# Patient Record
Sex: Male | Born: 1950
Health system: Southern US, Community
[De-identification: ages and names within clinical notes are randomized; demographics above are authoritative.]

## PROBLEM LIST (undated history)

## (undated) DIAGNOSIS — I2510 Atherosclerotic heart disease of native coronary artery without angina pectoris: Secondary | ICD-10-CM

## (undated) DIAGNOSIS — M7989 Other specified soft tissue disorders: Secondary | ICD-10-CM

## (undated) DIAGNOSIS — E119 Type 2 diabetes mellitus without complications: Secondary | ICD-10-CM

## (undated) DIAGNOSIS — M109 Gout, unspecified: Secondary | ICD-10-CM

## (undated) DIAGNOSIS — M1990 Unspecified osteoarthritis, unspecified site: Secondary | ICD-10-CM

## (undated) DIAGNOSIS — E785 Hyperlipidemia, unspecified: Secondary | ICD-10-CM

## (undated) DIAGNOSIS — C801 Malignant (primary) neoplasm, unspecified: Secondary | ICD-10-CM

## (undated) DIAGNOSIS — I219 Acute myocardial infarction, unspecified: Secondary | ICD-10-CM

## (undated) DIAGNOSIS — I10 Essential (primary) hypertension: Secondary | ICD-10-CM

## (undated) HISTORY — PX: BACK SURGERY: SHX140

## (undated) HISTORY — DX: Essential (primary) hypertension: I10

## (undated) HISTORY — DX: Hyperlipidemia, unspecified: E78.5

## (undated) HISTORY — DX: Malignant (primary) neoplasm, unspecified: C80.1

## (undated) HISTORY — PX: PROSTATE SURGERY: SHX751

---

## 1989-06-23 HISTORY — PX: LUMBAR DISC SURGERY: SHX700

## 1998-08-23 ENCOUNTER — Ambulatory Visit (HOSPITAL_COMMUNITY): Admission: RE | Admit: 1998-08-23 | Discharge: 1998-08-23 | Payer: Self-pay | Admitting: Gastroenterology

## 1999-02-28 ENCOUNTER — Encounter: Payer: Self-pay | Admitting: Neurosurgery

## 1999-03-01 ENCOUNTER — Inpatient Hospital Stay (HOSPITAL_COMMUNITY): Admission: RE | Admit: 1999-03-01 | Discharge: 1999-03-01 | Payer: Self-pay | Admitting: Neurosurgery

## 1999-03-01 ENCOUNTER — Encounter: Payer: Self-pay | Admitting: Neurosurgery

## 2000-10-22 ENCOUNTER — Encounter: Admission: RE | Admit: 2000-10-22 | Discharge: 2001-01-20 | Payer: Self-pay | Admitting: Emergency Medicine

## 2001-02-06 ENCOUNTER — Encounter: Payer: Self-pay | Admitting: Emergency Medicine

## 2001-02-06 ENCOUNTER — Emergency Department (HOSPITAL_COMMUNITY): Admission: AC | Admit: 2001-02-06 | Discharge: 2001-02-06 | Payer: Self-pay

## 2001-12-23 ENCOUNTER — Ambulatory Visit (HOSPITAL_COMMUNITY): Admission: RE | Admit: 2001-12-23 | Discharge: 2001-12-23 | Payer: Self-pay | Admitting: Gastroenterology

## 2003-06-09 ENCOUNTER — Encounter: Admission: RE | Admit: 2003-06-09 | Discharge: 2003-06-09 | Payer: Self-pay | Admitting: Emergency Medicine

## 2003-06-09 ENCOUNTER — Encounter: Payer: Self-pay | Admitting: Emergency Medicine

## 2005-08-27 ENCOUNTER — Emergency Department (HOSPITAL_COMMUNITY): Admission: EM | Admit: 2005-08-27 | Discharge: 2005-08-27 | Payer: Self-pay | Admitting: Emergency Medicine

## 2006-07-16 ENCOUNTER — Emergency Department (HOSPITAL_COMMUNITY): Admission: EM | Admit: 2006-07-16 | Discharge: 2006-07-16 | Payer: Self-pay | Admitting: Emergency Medicine

## 2007-02-05 ENCOUNTER — Ambulatory Visit: Payer: Self-pay | Admitting: Cardiovascular Disease

## 2007-02-18 ENCOUNTER — Ambulatory Visit: Payer: Self-pay

## 2007-03-25 ENCOUNTER — Ambulatory Visit: Payer: Self-pay | Admitting: Cardiovascular Disease

## 2007-03-25 ENCOUNTER — Encounter: Payer: Self-pay | Admitting: Cardiovascular Disease

## 2007-03-25 ENCOUNTER — Ambulatory Visit: Payer: Self-pay

## 2008-01-27 ENCOUNTER — Encounter (INDEPENDENT_AMBULATORY_CARE_PROVIDER_SITE_OTHER): Payer: Self-pay | Admitting: Gastroenterology

## 2008-01-27 ENCOUNTER — Ambulatory Visit (HOSPITAL_COMMUNITY): Admission: RE | Admit: 2008-01-27 | Discharge: 2008-01-27 | Payer: Self-pay | Admitting: Gastroenterology

## 2010-10-10 ENCOUNTER — Ambulatory Visit (HOSPITAL_COMMUNITY)
Admission: RE | Admit: 2010-10-10 | Discharge: 2010-10-10 | Payer: Self-pay | Source: Home / Self Care | Attending: Internal Medicine | Admitting: Internal Medicine

## 2010-10-10 ENCOUNTER — Encounter (INDEPENDENT_AMBULATORY_CARE_PROVIDER_SITE_OTHER): Payer: Self-pay | Admitting: Internal Medicine

## 2011-01-28 ENCOUNTER — Emergency Department (HOSPITAL_COMMUNITY)
Admission: EM | Admit: 2011-01-28 | Discharge: 2011-01-28 | Disposition: A | Discharge status: Home / Self Care | Payer: 59 | Attending: Emergency Medicine | Admitting: Emergency Medicine

## 2011-01-28 DIAGNOSIS — E119 Type 2 diabetes mellitus without complications: Secondary | ICD-10-CM | POA: Insufficient documentation

## 2011-01-28 DIAGNOSIS — R609 Edema, unspecified: Secondary | ICD-10-CM | POA: Insufficient documentation

## 2011-01-28 DIAGNOSIS — M79609 Pain in unspecified limb: Secondary | ICD-10-CM | POA: Insufficient documentation

## 2011-01-28 DIAGNOSIS — M129 Arthropathy, unspecified: Secondary | ICD-10-CM | POA: Insufficient documentation

## 2011-01-28 DIAGNOSIS — I10 Essential (primary) hypertension: Secondary | ICD-10-CM | POA: Insufficient documentation

## 2011-03-03 ENCOUNTER — Emergency Department (HOSPITAL_COMMUNITY)
Admission: EM | Admit: 2011-03-03 | Discharge: 2011-03-03 | Disposition: A | Discharge status: Home / Self Care | Payer: PRIVATE HEALTH INSURANCE | Attending: Emergency Medicine | Admitting: Emergency Medicine

## 2011-03-03 ENCOUNTER — Emergency Department (HOSPITAL_COMMUNITY): Payer: PRIVATE HEALTH INSURANCE

## 2011-03-03 DIAGNOSIS — M25519 Pain in unspecified shoulder: Secondary | ICD-10-CM | POA: Insufficient documentation

## 2011-03-03 DIAGNOSIS — Y92009 Unspecified place in unspecified non-institutional (private) residence as the place of occurrence of the external cause: Secondary | ICD-10-CM | POA: Insufficient documentation

## 2011-03-03 DIAGNOSIS — I10 Essential (primary) hypertension: Secondary | ICD-10-CM | POA: Insufficient documentation

## 2011-03-03 DIAGNOSIS — E119 Type 2 diabetes mellitus without complications: Secondary | ICD-10-CM | POA: Insufficient documentation

## 2011-03-03 DIAGNOSIS — W010XXA Fall on same level from slipping, tripping and stumbling without subsequent striking against object, initial encounter: Secondary | ICD-10-CM | POA: Insufficient documentation

## 2011-03-03 DIAGNOSIS — M79609 Pain in unspecified limb: Secondary | ICD-10-CM | POA: Insufficient documentation

## 2011-03-07 NOTE — Assessment & Plan Note (Signed)
[facility]                            CARDIOLOGY OFFICE NOTE   NAME:Tanabe, KEBIN MAYE                   MRN:          [id number]  DATE:03/25/2007                            DOB:          1951/04/10    Gregory [name] was seen in followup at the [facility] Cardiology office  on March 25, 2007.  He is a 60 year old gentleman who was referred for  evaluation because of an abnormal EKG.  His EKG demonstrated  anterolateral T-wave changes that raised some concern for ischemia.  Mr.  Meisenheimer is asymptomatic but in that setting, with a background of  diabetes, hypertension and hyperlipidemia, we elected to perform a  nuclear stress study.  His Myoview stress test was performed on April 28  and demonstrated normal perfusion.  The calculated gated left  ventricular ejection fraction was 42% but visually the EF appeared  better than that.  A followup echocardiogram was recommended.  His  echocardiogram was performed this morning and has not been officially  interpreted yet.  However, I have reviewed his echocardiogram and his LV  function appears vigorous.  I would estimate his ejection fraction in  the range of 65%.  He does meet criteria for left ventricular  hypertrophy and also has some diastolic dysfunction based on his mitral  in-flow.   He remains asymptomatic from a cardiac standpoint and continues to work  without any problems.  He does complain of a nonproductive cough that  has been ongoing ever since he started Lotrel.   CURRENT MEDICINES:  1. Lotrel 5/20 mg daily.  2. Metformin 500 mg two twice daily.  3. Pravachol 40 mg at bedtime.  4. Probenecid 250 mg daily.   ALLERGIES:  No known drug allergies.   PHYSICAL EXAMINATION:  GENERAL:  The patient is alert and oriented.  He  is in no acute distress.  VITAL SIGNS:  Weight is 230 pounds, blood pressure is 154/90.  Heart  rate initially was 108, on my recheck it was 100.  HEENT:  Normal.  NECK:  Normal carotid upstrokes without bruits.  Jugular venous pressure  is normal.  LUNGS:  Clear to auscultation bilaterally.  HEART:  Regular rate and rhythm without murmur or gallop.  The apex is  discrete and nondisplaced.  ABDOMEN:  Soft, obese, nontender, no organomegaly.  EXTREMITIES:  No clubbing, cyanosis, or edema.  Peripheral pulses are 2+  and equal throughout.   ASSESSMENT:  Mr. Kathan is a 60 year old gentleman who is stable  from a cardiac standpoint.  His cardiac problems are as follows:  1. Essential hypertension with evidence of hypertensive myocardial      disease based on his left ventricular hypertrophy and abnormal      diastolic parameters on his echocardiogram.  I think control of his      blood pressure over the long-term should be his main cardiac goal.      We discussed the importance of diet and weight reduction.  He      appears to be intolerant to the ACE inhibitor portion of his      Lotrel.  I  have taken the liberty of switching him to Norvasc 10 mg      daily and Avapro 150 mg daily.  We will see how his blood pressure      responds to this combination of calcium channel blocker and ARB.      He has a followup appointment with Dr. Lorenz Coaster in 1 month and then I      would like to see him back in a few months to see how he is doing      at that point.  2. Abnormal electrocardiogram.  Again, his Myoview study showed normal      perfusion.  No further cardiac workup is necessary at this time.      In the setting of his diabetes and other multitude of risk factors,      I do think he should start on aspirin 81 mg daily for primary      prevention.   For followup, I will see Mr. Duda back in 2 months as above.     Veverly Fells. Excell Seltzer, MD  Electronically Signed    MDC/MedQ  DD: 03/25/2007  DT: 03/25/2007  Job #: [id number]   cc:   Reuben Likes, M.D.

## 2011-03-10 NOTE — Letter (Signed)
February 05, 2007    Dr. Leslee Home  [address] [place], [place] [address]   RE:  Gregory [name]  MRN:  [id number]  /  DOB:  January 22, 1951   Dear Dr. Lorenz Coaster,   It was my pleasure to see Gregory [name] as an outpatient at the  [facility] cardiology office on February 05, 2007. As you know he is a very  nice 60 year old African American gentleman with an abnormal EKG.   Gregory [name] has no cardiac history. He denies any problems with chest  pain, dyspnea, or other cardiac related complaints. 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 He  specifically denies any chest pain or dyspnea with exertion. He has had  no chest pressure. He denies lightheadedness, syncope, orthopnea, or  PND. He has noticed left leg swelling for sometime. He has had no  palpitations or claudication symptoms.   PAST MEDICAL HISTORY:  1. Type 2 diabetes mangaged with metformin, first diagnosed      approximately 6 years ago.  2. Hypertension, currently on Lotrel 5/20 mg daily. He reports that he      was taking it twice daily but insurance would not cover that. He      just started taking it once daily.  3. Dyslipidemia, on Pravachol.  4. Gout.  5. Lumbar back surgery in 1999.   SOCIAL HISTORY:  The patient is married. He has 2 children. 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 He  does not regularly exercise. He is a former smoker but quit 10 years  ago. He drinks alcohol only on occasion. He does not drink caffeine.   FAMILY HISTORY:  His mother died in her 59s from cancer. His father died  at age 64 of cancer. He has 2 sisters who are healthy. There is no  coronary artery disease in the family.   REVIEW OF SYSTEMS:  A complete 12-point review of systems was preformed,  pertinent positive included occasional headache, as well as foot pain  from gout. No other positives to report.   PHYSICAL EXAMINATION:  The patient is alert and oriented. He is in no  acute distress. Weight is 228 pounds, height is 5 feet 8 inches, blood  pressure is 144/90, heart rate is 86, respiratory rate is 16.  HEENT: Normal.  NECK: Normal carotid upstrokes without bruits. Jugular venous pressure  is normal. There is no thyromegaly or thyroid nodules.  LUNGS: Clear to auscultation bilaterally.  CARDIAC: The apex is not palpable. There is no right ventricular heave  or lift.  HEART: Regular rate and rhythm without murmurs or gallops.  ABDOMEN: Soft, obese, nontender. No organomegaly. No abdominal bruits.  EXTREMITIES: There is no clubbing, cyanosis, or edema. There is 1+ edema  in the left lower leg. The right lower leg has no edema.  SKIN: Warm and dry without rash.  NEUROLOGIC: Cranial nerves II-XII are intact. Strength is 5/5 and equal  in the arms and legs bilaterally.  MUSCULOSKELETAL: There is no joint deformity or effusions.   EKG: Shows normal sinus rhythm with a lateral T wave abnormality,  consider lateral ischemia. There is left atrial enlargement present.   ASSESSMENT:  Gregory [name] is an asymptomatic 60 year old man with  multiple cardiac risk factors and an abnormal EKG. His EKG is  nondiagnostic but is concerning for  lateral ischemia. I think in the  setting of his multiple risk factors, that we should proceed with an  exercise Myoview stress study to rule out significant obstructive  coronary artery disease. His lack of symptoms may be related to his  diabetes and he still could have significant underlying coronary  disease.   He also has noted left leg swelling which is apparent on exam today. I  am going to check a venous duplex to rule out deep vein thrombosis. I  suspect his leg swelling is related to his Achilles injury but it does  involve the pre tibial region and not just the ankle.   For follow up we will notify [name] of his test results. If  there are any problems, I will bring him back to clinic to discuss  further diagnostic test that may be necessary.   Dr. Lorenz Coaster thanks again for allowing me to see Gregory [name]. I will be  in touch with you after the results of his nuclear study and venous  duplex are complete. Please feel free to call at any time with questions  regarding his care.    Sincerely,      Veverly Fells. Excell Seltzer, MD  Electronically Signed    MDC/MedQ  DD: 02/05/2007  DT: 02/05/2007  Job #: [id number]

## 2011-03-24 ENCOUNTER — Other Ambulatory Visit (HOSPITAL_COMMUNITY): Payer: Self-pay | Admitting: Orthopedic Surgery

## 2011-03-24 DIAGNOSIS — M25562 Pain in left knee: Secondary | ICD-10-CM

## 2011-03-27 ENCOUNTER — Ambulatory Visit (HOSPITAL_COMMUNITY)
Admission: RE | Admit: 2011-03-27 | Discharge: 2011-03-27 | Disposition: A | Discharge status: Home / Self Care | Payer: 59 | Source: Ambulatory Visit | Attending: Orthopedic Surgery | Admitting: Orthopedic Surgery

## 2011-03-27 DIAGNOSIS — M1710 Unilateral primary osteoarthritis, unspecified knee: Secondary | ICD-10-CM | POA: Insufficient documentation

## 2011-03-27 DIAGNOSIS — R609 Edema, unspecified: Secondary | ICD-10-CM | POA: Insufficient documentation

## 2011-03-27 DIAGNOSIS — M7120 Synovial cyst of popliteal space [Baker], unspecified knee: Secondary | ICD-10-CM | POA: Insufficient documentation

## 2011-03-27 DIAGNOSIS — M23329 Other meniscus derangements, posterior horn of medial meniscus, unspecified knee: Secondary | ICD-10-CM | POA: Insufficient documentation

## 2011-03-27 DIAGNOSIS — IMO0002 Reserved for concepts with insufficient information to code with codable children: Secondary | ICD-10-CM | POA: Insufficient documentation

## 2011-03-27 DIAGNOSIS — M25569 Pain in unspecified knee: Secondary | ICD-10-CM | POA: Insufficient documentation

## 2011-03-27 DIAGNOSIS — M25562 Pain in left knee: Secondary | ICD-10-CM

## 2011-06-13 ENCOUNTER — Ambulatory Visit: Payer: PRIVATE HEALTH INSURANCE | Attending: Orthopedic Surgery

## 2011-06-13 DIAGNOSIS — M6281 Muscle weakness (generalized): Secondary | ICD-10-CM | POA: Insufficient documentation

## 2011-06-13 DIAGNOSIS — IMO0001 Reserved for inherently not codable concepts without codable children: Secondary | ICD-10-CM | POA: Insufficient documentation

## 2011-06-13 DIAGNOSIS — M25519 Pain in unspecified shoulder: Secondary | ICD-10-CM | POA: Insufficient documentation

## 2011-06-19 ENCOUNTER — Ambulatory Visit: Payer: PRIVATE HEALTH INSURANCE

## 2011-06-21 ENCOUNTER — Ambulatory Visit: Payer: PRIVATE HEALTH INSURANCE

## 2011-06-22 ENCOUNTER — Ambulatory Visit: Payer: PRIVATE HEALTH INSURANCE

## 2011-06-28 ENCOUNTER — Ambulatory Visit: Payer: PRIVATE HEALTH INSURANCE | Attending: Orthopedic Surgery

## 2011-06-28 DIAGNOSIS — IMO0001 Reserved for inherently not codable concepts without codable children: Secondary | ICD-10-CM | POA: Insufficient documentation

## 2011-06-28 DIAGNOSIS — M25519 Pain in unspecified shoulder: Secondary | ICD-10-CM | POA: Insufficient documentation

## 2011-06-28 DIAGNOSIS — M6281 Muscle weakness (generalized): Secondary | ICD-10-CM | POA: Insufficient documentation

## 2011-12-20 ENCOUNTER — Encounter: Payer: 59 | Attending: Internal Medicine | Admitting: *Deleted

## 2011-12-21 ENCOUNTER — Encounter: Payer: Self-pay | Admitting: *Deleted

## 2011-12-21 NOTE — Patient Instructions (Signed)
Patient will attend Core Diabetes Courses II and III as scheduled or follow up prn.  

## 2011-12-21 NOTE — Progress Notes (Signed)
  Patient was seen on 12/20/11 for the first of a series of three diabetes self-management courses at the [facility]. The following learning objectives were met by the patient during this course:   Defines the role of glucose and insulin  Identifies type of diabetes and pathophysiology  Defines the diagnostic criteria for diabetes and prediabetes  States the risk factors for Type 2 Diabetes  States the symptoms of Type 2 Diabetes  Defines Type 2 Diabetes treatment goals  Defines Type 2 Diabetes treatment options  States the rationale for glucose monitoring  Identifies A1C, glucose targets, and testing times  Identifies proper sharps disposal  Defines the purpose of a diabetes food plan  Identifies carbohydrate food groups  Defines effects of carbohydrate foods on glucose levels  Identifies carbohydrate choices/grams/food labels  States benefits of physical activity and effect on glucose  Review of suggested activity guidelines  Last A1c: 7.7% (per [facility] 12/01/11)   Handouts given during class include:  Type 2 Diabetes: Basics Book  My Food Plan Book  Food and Activity Log  Patient has established the following initial goals:  Increase exercise.  Monitor blood glucose.  Lose weight.  Follow a diabetes meal plan.  Follow-Up Plan: Patient to attend Core Diabetes Courses II and III in April 2013.

## 2012-02-01 ENCOUNTER — Encounter: Payer: 59 | Attending: Internal Medicine | Admitting: *Deleted

## 2012-02-02 ENCOUNTER — Encounter: Payer: Self-pay | Admitting: *Deleted

## 2012-02-02 NOTE — Progress Notes (Signed)
  Patient was seen on 02/01/2012 for the second of a series of three diabetes self-management courses at the [facility]. The following learning objectives were met by the patient during this course:   Explain basic nutrition maintenance and quality assurance  Describe causes, symptoms and treatment of hypoglycemia and hyperglycemia  Explain how to manage diabetes during illness  Describe the importance of good nutrition for health and healthy eating strategies  List strategies to follow meal plan when dining out  Describe the effects of alcohol on glucose and how to use it safely  Describe problem solving skills for day-to-day glucose challenges  Describe strategies to use when treatment plan needs to change  Identify important factors involved in successful weight loss  Describe ways to remain physically active  Describe the impact of regular activity on insulin resistance  Handouts given in class:  Refrigerator magnet for Sick Day Guidelines  [facility] Oral medication/insulin handout  Follow-Up Plan: Patient will attend the final class of the ADA Diabetes Self-Care Education.    

## 2012-02-08 ENCOUNTER — Encounter: Payer: Self-pay | Admitting: *Deleted

## 2012-02-08 ENCOUNTER — Encounter: Payer: 59 | Admitting: *Deleted

## 2012-02-08 NOTE — Progress Notes (Signed)
Patient was seen on 02/08/2012 for the third of a series of three diabetes self-management courses at the [facility]. The following learning objectives were met by the patient during this course:    Describe how diabetes changes over time   Identify diabetes complications and ways to prevent them   Describe strategies that can promote heart health including lowering blood pressure and cholesterol   Describe strategies to lower dietary fat and sodium in the diet   Identify physical activities that benefit cardiovascular health   Evaluate success in meeting personal goal   Describe the belief that they can live successfully with diabetes day to day   Establish 2-3 goals that they will plan to diligently work on until they return for the free 67-month follow-up visit  The following handouts were given in class:  3 Month Follow Up Visit handout  Goal setting handout  Class evaluation form  Your patient has established the following 3 month goals for diabetes self-care:  Reduce fat in my diet by eating less fat at two or more meals a day   Increase my activity at least 5 days a week  Test my glucose at least 2 times a day, 7 days a week  Follow-Up Plan: Patient will attend a 3 month follow-up visit for diabetes self-management education.

## 2012-04-08 ENCOUNTER — Other Ambulatory Visit: Payer: Self-pay | Admitting: Surgery

## 2012-05-09 ENCOUNTER — Ambulatory Visit: Payer: Self-pay | Admitting: *Deleted

## 2012-05-13 ENCOUNTER — Ambulatory Visit: Payer: Self-pay | Admitting: *Deleted

## 2012-05-23 DIAGNOSIS — I219 Acute myocardial infarction, unspecified: Secondary | ICD-10-CM

## 2012-05-23 HISTORY — DX: Acute myocardial infarction, unspecified: I21.9

## 2012-06-03 ENCOUNTER — Encounter (HOSPITAL_COMMUNITY): Payer: Self-pay | Admitting: *Deleted

## 2012-06-03 ENCOUNTER — Emergency Department (HOSPITAL_COMMUNITY): Payer: 59

## 2012-06-03 ENCOUNTER — Inpatient Hospital Stay (HOSPITAL_COMMUNITY)
Admission: EM | Admit: 2012-06-03 | Discharge: 2012-06-06 | DRG: 247 | Disposition: A | Discharge status: Home / Self Care | Payer: 59 | Attending: Internal Medicine | Admitting: Internal Medicine

## 2012-06-03 DIAGNOSIS — E785 Hyperlipidemia, unspecified: Secondary | ICD-10-CM | POA: Diagnosis present

## 2012-06-03 DIAGNOSIS — R079 Chest pain, unspecified: Secondary | ICD-10-CM | POA: Diagnosis present

## 2012-06-03 DIAGNOSIS — Z955 Presence of coronary angioplasty implant and graft: Secondary | ICD-10-CM

## 2012-06-03 DIAGNOSIS — Z794 Long term (current) use of insulin: Secondary | ICD-10-CM

## 2012-06-03 DIAGNOSIS — M109 Gout, unspecified: Secondary | ICD-10-CM | POA: Diagnosis present

## 2012-06-03 DIAGNOSIS — E119 Type 2 diabetes mellitus without complications: Secondary | ICD-10-CM

## 2012-06-03 DIAGNOSIS — I214 Non-ST elevation (NSTEMI) myocardial infarction: Principal | ICD-10-CM

## 2012-06-03 DIAGNOSIS — E669 Obesity, unspecified: Secondary | ICD-10-CM | POA: Diagnosis present

## 2012-06-03 DIAGNOSIS — I10 Essential (primary) hypertension: Secondary | ICD-10-CM | POA: Diagnosis present

## 2012-06-03 DIAGNOSIS — E1169 Type 2 diabetes mellitus with other specified complication: Secondary | ICD-10-CM | POA: Diagnosis present

## 2012-06-03 DIAGNOSIS — I2510 Atherosclerotic heart disease of native coronary artery without angina pectoris: Secondary | ICD-10-CM | POA: Diagnosis present

## 2012-06-03 HISTORY — DX: Acute myocardial infarction, unspecified: I21.9

## 2012-06-03 HISTORY — DX: Unspecified osteoarthritis, unspecified site: M19.90

## 2012-06-03 HISTORY — DX: Gout, unspecified: M10.9

## 2012-06-03 HISTORY — DX: Atherosclerotic heart disease of native coronary artery without angina pectoris: I25.10

## 2012-06-03 HISTORY — DX: Type 2 diabetes mellitus without complications: E11.9

## 2012-06-03 HISTORY — DX: Other specified soft tissue disorders: M79.89

## 2012-06-03 LAB — CBC WITH DIFFERENTIAL/PLATELET
Basophils Absolute: 0 10*3/uL (ref 0.0–0.1)
Basophils Relative: 0 % (ref 0–1)
Eosinophils Absolute: 0.2 10*3/uL (ref 0.0–0.7)
Eosinophils Relative: 2 % (ref 0–5)
HCT: 43.3 % (ref 39.0–52.0)
Hemoglobin: 14.9 g/dL (ref 13.0–17.0)
Lymphocytes Relative: 22 % (ref 12–46)
Lymphs Abs: 1.8 10*3/uL (ref 0.7–4.0)
MCH: 30.5 pg (ref 26.0–34.0)
MCHC: 34.4 g/dL (ref 30.0–36.0)
MCV: 88.5 fL (ref 78.0–100.0)
Monocytes Absolute: 0.5 10*3/uL (ref 0.1–1.0)
Monocytes Relative: 6 % (ref 3–12)
Neutro Abs: 5.7 10*3/uL (ref 1.7–7.7)
Neutrophils Relative %: 70 % (ref 43–77)
Platelets: 174 10*3/uL (ref 150–400)
RBC: 4.89 MIL/uL (ref 4.22–5.81)
RDW: 13.7 % (ref 11.5–15.5)
WBC: 8.1 10*3/uL (ref 4.0–10.5)

## 2012-06-03 LAB — COMPREHENSIVE METABOLIC PANEL
ALT: 16 U/L (ref 0–53)
AST: 17 U/L (ref 0–37)
Albumin: 3.7 g/dL (ref 3.5–5.2)
Alkaline Phosphatase: 51 U/L (ref 39–117)
BUN: 22 mg/dL (ref 6–23)
CO2: 26 mEq/L (ref 19–32)
Calcium: 9.4 mg/dL (ref 8.4–10.5)
Chloride: 104 mEq/L (ref 96–112)
Creatinine, Ser: 1.33 mg/dL (ref 0.50–1.35)
GFR calc Af Amer: 65 mL/min — ABNORMAL LOW (ref >90)
GFR calc non Af Amer: 56 mL/min — ABNORMAL LOW (ref >90)
Glucose, Bld: 180 mg/dL — ABNORMAL HIGH (ref 70–99)
Potassium: 4.2 mEq/L (ref 3.5–5.1)
Sodium: 141 mEq/L (ref 135–145)
Total Bilirubin: 0.3 mg/dL (ref 0.3–1.2)
Total Protein: 6.8 g/dL (ref 6.0–8.3)

## 2012-06-03 LAB — POCT I-STAT TROPONIN I
Troponin i, poc: 0.06 ng/mL (ref 0.00–0.08)
Troponin i, poc: 0.06 ng/mL (ref 0.00–0.08)

## 2012-06-03 LAB — LIPASE, BLOOD: Lipase: 40 U/L (ref 11–59)

## 2012-06-03 LAB — GLUCOSE, CAPILLARY: Glucose-Capillary: 124 mg/dL — ABNORMAL HIGH (ref 70–99)

## 2012-06-03 NOTE — H&P (Signed)
Gregory [name] is an 61 y.o. male.   Chief Complaint: Chest pain HPI: A 61 year old gentleman with history of diabetes hypertension hyperlipidemia presenting with chest pain. Symptoms started abruptly 2 weeks ago mainly in the epigastric to left-sided chest. Rated as 4-5/10. Feels like indigestion palpation. Worsened with food initially. Now even at rest. Has never had any heart problems. Has never been seen by cardiologist. And strong family history of heart disease patient however has significant risk factors for coronary artery disease. No radiation of his pain, no diaphoresis no shortness of breath no cough. Patient also denied any fever. Pain is not reproducible by pressing on the chest wall.  Past Medical History  Diagnosis Date  . Diabetes mellitus   . Hypertension   . Hyperlipidemia     History reviewed. No pertinent past surgical history.  History reviewed. No pertinent family history. Social History:  reports that he has never smoked. He does not have any smokeless tobacco history on file. He reports that he does not drink alcohol. His drug history not on file.  Allergies: No Known Allergies   (Not in a hospital admission)  Results for orders placed during the hospital encounter of 06/03/12 (from the past 48 hour(s))  POCT I-STAT TROPONIN I     Status: Normal   Collection Time   06/03/12  7:26 PM      Component Value Range Comment   Troponin i, poc 0.06  0.00 - 0.08 ng/mL    Comment 3            CBC WITH DIFFERENTIAL     Status: Normal   Collection Time   06/03/12  8:49 PM      Component Value Range Comment   WBC 8.1  4.0 - 10.5 K/uL    RBC 4.89  4.22 - 5.81 MIL/uL    Hemoglobin 14.9  13.0 - 17.0 g/dL    HCT 16.1  09.6 - 04.5 %    MCV 88.5  78.0 - 100.0 fL    MCH 30.5  26.0 - 34.0 pg    MCHC 34.4  30.0 - 36.0 g/dL    RDW 40.9  81.1 - 91.4 %    Platelets 174  150 - 400 K/uL    Neutrophils Relative 70  43 - 77 %    Neutro Abs 5.7  1.7 - 7.7 K/uL    Lymphocytes  Relative 22  12 - 46 %    Lymphs Abs 1.8  0.7 - 4.0 K/uL    Monocytes Relative 6  3 - 12 %    Monocytes Absolute 0.5  0.1 - 1.0 K/uL    Eosinophils Relative 2  0 - 5 %    Eosinophils Absolute 0.2  0.0 - 0.7 K/uL    Basophils Relative 0  0 - 1 %    Basophils Absolute 0.0  0.0 - 0.1 K/uL   COMPREHENSIVE METABOLIC PANEL     Status: Abnormal   Collection Time   06/03/12  8:49 PM      Component Value Range Comment   Sodium 141  135 - 145 mEq/L    Potassium 4.2  3.5 - 5.1 mEq/L    Chloride 104  96 - 112 mEq/L    CO2 26  19 - 32 mEq/L    Glucose, Bld 180 (*) 70 - 99 mg/dL    BUN 22  6 - 23 mg/dL    Creatinine, Ser 7.82  0.50 - 1.35 mg/dL  Calcium 9.4  8.4 - 10.5 mg/dL    Total Protein 6.8  6.0 - 8.3 g/dL    Albumin 3.7  3.5 - 5.2 g/dL    AST 17  0 - 37 U/L    ALT 16  0 - 53 U/L    Alkaline Phosphatase 51  39 - 117 U/L    Total Bilirubin 0.3  0.3 - 1.2 mg/dL    GFR calc non Af Amer 56 (*) >90 mL/min    GFR calc Af Amer 65 (*) >90 mL/min   LIPASE, BLOOD     Status: Normal   Collection Time   06/03/12  8:49 PM      Component Value Range Comment   Lipase 40  11 - 59 U/L   POCT I-STAT TROPONIN I     Status: Normal   Collection Time   06/03/12  9:10 PM      Component Value Range Comment   Troponin i, poc 0.06  0.00 - 0.08 ng/mL    Comment 3             Dg Chest 2 View  06/03/2012  *RADIOLOGY REPORT*  Clinical Data: Chest pain, hypertension.  CHEST - 2 VIEW  Comparison: None.  Findings: Mild spurring in the mid thoracic spine. Lungs clear. Heart size and pulmonary vascularity normal.  No effusion.  IMPRESSION: No acute disease  Original Report Authenticated By: Osa Craver, M.D.    Review of Systems  Constitutional: Negative.   HENT: Negative.   Eyes: Negative.   Respiratory: Negative.   Cardiovascular: Positive for chest pain.  Gastrointestinal: Negative.   Genitourinary: Negative.   Musculoskeletal: Negative.   Skin: Negative.   Neurological: Negative.     Endo/Heme/Allergies: Negative.   Psychiatric/Behavioral: Negative.     Blood pressure 138/90, pulse 74, temperature 98.5 F (36.9 C), temperature source Oral, resp. rate 16, SpO2 96.00%. Physical Exam   Assessment/Plan A 61 year old gentleman presenting with what appears to be atypical chest pain. Patient has significant risk factors for coronary artery disease and is being admitted for further workup.  Plan #1 chest pain: We'll admit the patient for observation check serial cardiac enzymes get an echocardiogram. Due to his significant risk factors he would benefit from a stress test if his enzymes negative.NTG, ASA and Mor[hine with Oxygen.  #2 diabetes: Continue sliding scale insulin and his home Lantus.  #3 Hypertension: Continue his home medications.  #4 hyperlipidemia: Continue his statin.  #5 DVT prophylaxis: Patient is on Lovenox.  Ednah Hammock,LAWAL 06/03/2012, 10:36 PM

## 2012-06-03 NOTE — ED Notes (Signed)
Pt walked into ED through ambulance bay with complaint of CP. States pain started a cple of weeks ago. Nothing makes pain worse or better. Pt states it feels like indigestion. Skin w/d, resp e/u

## 2012-06-03 NOTE — ED Provider Notes (Signed)
History     CSN: [id number]  Arrival date & time 06/03/12  1748   First MD Initiated Contact with Patient 06/03/12 2027      Chief Complaint  Patient presents with  . Chest Pain    (Consider location/radiation/quality/duration/timing/severity/associated sxs/prior treatment) Patient is a 61 y.o. male presenting with chest pain. The history is provided by the patient.  Chest Pain The chest pain began 1 - 2 weeks ago (2 weeks). Chest pain occurs frequently. The chest pain is worsening (increasing frequency). The pain is associated with eating. At its most intense, the pain is at 5/10. The pain is currently at 0/10. The severity of the pain is moderate. The quality of the pain is described as burning ("indigestion"). The pain does not radiate. Chest pain is worsened by eating. Pertinent negatives for primary symptoms include no fever, no syncope, no shortness of breath, no cough, no palpitations, no abdominal pain, no nausea, no vomiting and no dizziness.  Pertinent negatives for associated symptoms include no lower extremity edema, no near-syncope, no orthopnea, no paroxysmal nocturnal dyspnea and no weakness. Treatments tried: Tums. Risk factors include being elderly, lack of exercise, male gender and obesity.  His past medical history is significant for diabetes, hyperlipidemia and hypertension.  Pertinent negatives for past medical history include no CAD.     Past Medical History  Diagnosis Date  . Diabetes mellitus   . Hypertension   . Hyperlipidemia     History reviewed. No pertinent past surgical history.  History reviewed. No pertinent family history.  History  Substance Use Topics  . Smoking status: Never Smoker   . Smokeless tobacco: Not on file  . Alcohol Use: No      Review of Systems  Constitutional: Negative for fever and chills.  HENT: Negative.   Respiratory: Negative for cough and shortness of breath.   Cardiovascular: Positive for chest pain. Negative for  palpitations, orthopnea, syncope and near-syncope.  Gastrointestinal: Negative for nausea, vomiting, abdominal pain and diarrhea.  Genitourinary: Negative.   Musculoskeletal: Negative.   Skin: Negative.   Neurological: Negative for dizziness, syncope, weakness and light-headedness.  All other systems reviewed and are negative.    Allergies  Review of patient's allergies indicates no known allergies.  Home Medications   Current Outpatient Rx  Name Route Sig Dispense Refill  . ALLOPURINOL 100 MG PO TABS Oral Take 100 mg by mouth 3 (three) times daily.    Marland Kitchen EXFORGE PO Oral Take 1 tablet by mouth daily.     . INSULIN GLARGINE 100 UNIT/ML  SOLN Subcutaneous Inject 25 Units into the skin every morning.     Marland Kitchen SIMVASTATIN 10 MG PO TABS Oral Take 10 mg by mouth at bedtime.    Marland Kitchen SITAGLIPTIN-METFORMIN HCL 50-1000 MG PO TABS Oral Take 1 tablet by mouth 2 (two) times daily with a meal.      BP 138/90  Pulse 74  Temp 98.5 F (36.9 C) (Oral)  Resp 16  SpO2 96%  Physical Exam  Nursing note and vitals reviewed. Constitutional: He is oriented to person, place, and time. He appears well-developed and well-nourished. No distress.  HENT:  Head: Normocephalic and atraumatic.  Eyes: Conjunctivae are normal.  Neck: Neck supple.  Cardiovascular: Normal rate, regular rhythm, normal heart sounds and intact distal pulses.  Exam reveals no friction rub.   No murmur heard. Pulmonary/Chest: Effort normal and breath sounds normal. He has no wheezes. He has no rales.  Abdominal: Soft. He exhibits no distension.  There is no tenderness.  Musculoskeletal: Normal range of motion.  Neurological: He is alert and oriented to person, place, and time.  Skin: Skin is warm and dry.    ED Course  Procedures (including critical care time)  Labs Reviewed  COMPREHENSIVE METABOLIC PANEL - Abnormal; Notable for the following:    Glucose, Bld 180 (*)     GFR calc non Af Amer 56 (*)     GFR calc Af Amer 65 (*)      All other components within normal limits  POCT I-STAT TROPONIN I  CBC WITH DIFFERENTIAL  LIPASE, BLOOD  POCT I-STAT TROPONIN I   Dg Chest 2 View  06/03/2012  *RADIOLOGY REPORT*  Clinical Data: Chest pain, hypertension.  CHEST - 2 VIEW  Comparison: None.  Findings: Mild spurring in the mid thoracic spine. Lungs clear. Heart size and pulmonary vascularity normal.  No effusion.  IMPRESSION: No acute disease  Original Report Authenticated By: Thora Lance III, M.D.     1. Chest pain       MDM  61 yo male with PMHx of HTN, DM, HLD who presents with 2 weeks of worsening chest pain described as "indigestion".  No shortness of breath, orthopnea, or PND.  Pain increasing in frequency.  Sometimes associated with eating.  AF, VSS, NAD.  Pt currently pain free.  Last episode just prior to arrival and lasted approximately 30 minutes.  Pt currently pain free.  Presentation concerning for possible atypical unstable angina presentation or GI etiology.  CBC, CMP, and lipase all wnl.  Initial troponin negative.  CXR w/o acute abnormality.  Given pt's risk factors for cardiac disease, will admit to Hospitalist service for further evaluation and work-up.        Cherre Robins, MD 06/03/12 [phone]

## 2012-06-03 NOTE — ED Notes (Signed)
REPORT GIVEN TO [facility]ST FLOOR NURSE , PT. RESTING WITH NO CHEST PAIN / RESPIRATIONS UNLABORED , IV SITE UNREMARKABLE.

## 2012-06-04 LAB — CBC
HCT: 44.9 % (ref 39.0–52.0)
HCT: 47.9 % (ref 39.0–52.0)
Hemoglobin: 15.2 g/dL (ref 13.0–17.0)
Hemoglobin: 16.3 g/dL (ref 13.0–17.0)
MCH: 30.1 pg (ref 26.0–34.0)
MCH: 30.1 pg (ref 26.0–34.0)
MCHC: 33.9 g/dL (ref 30.0–36.0)
MCHC: 34 g/dL (ref 30.0–36.0)
MCV: 88.5 fL (ref 78.0–100.0)
MCV: 88.9 fL (ref 78.0–100.0)
Platelets: 166 10*3/uL (ref 150–400)
Platelets: 181 10*3/uL (ref 150–400)
RBC: 5.05 MIL/uL (ref 4.22–5.81)
RBC: 5.41 MIL/uL (ref 4.22–5.81)
RDW: 13.5 % (ref 11.5–15.5)
RDW: 13.7 % (ref 11.5–15.5)
WBC: 7.6 10*3/uL (ref 4.0–10.5)
WBC: 8.4 10*3/uL (ref 4.0–10.5)

## 2012-06-04 LAB — GLUCOSE, CAPILLARY
Glucose-Capillary: 108 mg/dL — ABNORMAL HIGH (ref 70–99)
Glucose-Capillary: 108 mg/dL — ABNORMAL HIGH (ref 70–99)
Glucose-Capillary: 117 mg/dL — ABNORMAL HIGH (ref 70–99)
Glucose-Capillary: 121 mg/dL — ABNORMAL HIGH (ref 70–99)
Glucose-Capillary: 127 mg/dL — ABNORMAL HIGH (ref 70–99)

## 2012-06-04 LAB — PRO B NATRIURETIC PEPTIDE: Pro B Natriuretic peptide (BNP): 21.6 pg/mL (ref 0–125)

## 2012-06-04 LAB — CARDIAC PANEL(CRET KIN+CKTOT+MB+TROPI)
CK, MB: 7.4 ng/mL (ref 0.3–4.0)
CK, MB: 6.9 ng/mL (ref 0.3–4.0)
CK, MB: 6.5 ng/mL (ref 0.3–4.0)
Relative Index: 2.9 — ABNORMAL HIGH (ref 0.0–2.5)
Relative Index: 3.3 — ABNORMAL HIGH (ref 0.0–2.5)
Relative Index: 3.7 — ABNORMAL HIGH (ref 0.0–2.5)
Total CK: 256 U/L — ABNORMAL HIGH (ref 7–232)
Total CK: 207 U/L (ref 7–232)
Total CK: 176 U/L (ref 7–232)
Troponin I: 0.37 ng/mL (ref <0.30)
Troponin I: 0.41 ng/mL (ref <0.30)
Troponin I: 0.37 ng/mL (ref <0.30)

## 2012-06-04 LAB — COMPREHENSIVE METABOLIC PANEL
ALT: 16 U/L (ref 0–53)
AST: 21 U/L (ref 0–37)
Albumin: 4 g/dL (ref 3.5–5.2)
Alkaline Phosphatase: 53 U/L (ref 39–117)
BUN: 21 mg/dL (ref 6–23)
CO2: 26 mEq/L (ref 19–32)
Calcium: 9.5 mg/dL (ref 8.4–10.5)
Chloride: 103 mEq/L (ref 96–112)
Creatinine, Ser: 1.13 mg/dL (ref 0.50–1.35)
GFR calc Af Amer: 79 mL/min — ABNORMAL LOW (ref >90)
GFR calc non Af Amer: 68 mL/min — ABNORMAL LOW (ref >90)
Glucose, Bld: 168 mg/dL — ABNORMAL HIGH (ref 70–99)
Potassium: 4.3 mEq/L (ref 3.5–5.1)
Sodium: 139 mEq/L (ref 135–145)
Total Bilirubin: 0.6 mg/dL (ref 0.3–1.2)
Total Protein: 7.1 g/dL (ref 6.0–8.3)

## 2012-06-04 LAB — CREATININE, SERUM
Creatinine, Ser: 1.27 mg/dL (ref 0.50–1.35)
GFR calc Af Amer: 68 mL/min — ABNORMAL LOW (ref >90)
GFR calc non Af Amer: 59 mL/min — ABNORMAL LOW (ref >90)

## 2012-06-04 LAB — TSH: TSH: 2.533 u[IU]/mL (ref 0.350–4.500)

## 2012-06-04 MED ORDER — ACETAMINOPHEN 325 MG PO TABS
650.0000 mg | ORAL_TABLET | Freq: Four times a day (QID) | ORAL | Status: DC | PRN
  Administered 2012-06-04: 650 mg via ORAL
  Filled 2012-06-04: qty 2

## 2012-06-04 MED ORDER — AMLODIPINE BESYLATE 5 MG PO TABS
5.0000 mg | ORAL_TABLET | Freq: Every day | ORAL | Status: DC
  Administered 2012-06-04 – 2012-06-06 (×3): 5 mg via ORAL
  Filled 2012-06-04 (×3): qty 1

## 2012-06-04 MED ORDER — SODIUM CHLORIDE 0.9 % IJ SOLN: 3.0000 mL | INTRAMUSCULAR | Status: DC | PRN

## 2012-06-04 MED ORDER — IRBESARTAN 150 MG PO TABS
150.0000 mg | ORAL_TABLET | Freq: Every day | ORAL | Status: DC
  Administered 2012-06-04 – 2012-06-06 (×3): 150 mg via ORAL
  Filled 2012-06-04 (×3): qty 1

## 2012-06-04 MED ORDER — SITAGLIPTIN PHOS-METFORMIN HCL 50-1000 MG PO TABS: 1.0000 | ORAL_TABLET | Freq: Two times a day (BID) | ORAL | Status: DC

## 2012-06-04 MED ORDER — ACETAMINOPHEN 650 MG RE SUPP: 650.0000 mg | Freq: Four times a day (QID) | RECTAL | Status: DC | PRN

## 2012-06-04 MED ORDER — ONDANSETRON HCL 4 MG/2ML IJ SOLN: 4.0000 mg | Freq: Four times a day (QID) | INTRAMUSCULAR | Status: DC | PRN

## 2012-06-04 MED ORDER — ONDANSETRON HCL 4 MG PO TABS: 4.0000 mg | ORAL_TABLET | Freq: Four times a day (QID) | ORAL | Status: DC | PRN

## 2012-06-04 MED ORDER — SODIUM CHLORIDE 0.9 % IV SOLN
INTRAVENOUS | Status: DC
  Administered 2012-06-05: 09:00:00 via INTRAVENOUS

## 2012-06-04 MED ORDER — INSULIN GLARGINE 100 UNIT/ML ~~LOC~~ SOLN
25.0000 [IU] | Freq: Every morning | SUBCUTANEOUS | Status: DC
  Administered 2012-06-04: 25 [IU] via SUBCUTANEOUS

## 2012-06-04 MED ORDER — ASPIRIN EC 81 MG PO TBEC
81.0000 mg | DELAYED_RELEASE_TABLET | Freq: Every day | ORAL | Status: DC
  Administered 2012-06-04: 81 mg via ORAL
  Filled 2012-06-04: qty 1

## 2012-06-04 MED ORDER — ASPIRIN EC 81 MG PO TBEC
81.0000 mg | DELAYED_RELEASE_TABLET | Freq: Every day | ORAL | Status: DC
  Administered 2012-06-06: 10:00:00 81 mg via ORAL
  Filled 2012-06-04: qty 1

## 2012-06-04 MED ORDER — ASPIRIN 81 MG PO CHEW
324.0000 mg | CHEWABLE_TABLET | ORAL | Status: AC
  Administered 2012-06-05: 324 mg via ORAL
  Filled 2012-06-04: qty 4

## 2012-06-04 MED ORDER — METFORMIN HCL 500 MG PO TABS
1000.0000 mg | ORAL_TABLET | Freq: Two times a day (BID) | ORAL | Status: DC
  Administered 2012-06-04 (×2): 1000 mg via ORAL
  Filled 2012-06-04 (×5): qty 2

## 2012-06-04 MED ORDER — SODIUM CHLORIDE 0.9 % IJ SOLN
3.0000 mL | Freq: Two times a day (BID) | INTRAMUSCULAR | Status: DC
  Administered 2012-06-04 (×2): 3 mL via INTRAVENOUS

## 2012-06-04 MED ORDER — AMLODIPINE BESYLATE-VALSARTAN 5-160 MG PO TABS: 1.0000 | ORAL_TABLET | Freq: Every day | ORAL | Status: DC

## 2012-06-04 MED ORDER — DIAZEPAM 5 MG PO TABS
5.0000 mg | ORAL_TABLET | ORAL | Status: AC
  Administered 2012-06-05: 5 mg via ORAL
  Filled 2012-06-04: qty 1

## 2012-06-04 MED ORDER — SIMVASTATIN 10 MG PO TABS
10.0000 mg | ORAL_TABLET | Freq: Every day | ORAL | Status: DC
  Administered 2012-06-04: 10 mg via ORAL
  Filled 2012-06-04 (×2): qty 1

## 2012-06-04 MED ORDER — HYDROCODONE-ACETAMINOPHEN 5-325 MG PO TABS: 1.0000 | ORAL_TABLET | ORAL | Status: DC | PRN

## 2012-06-04 MED ORDER — SODIUM CHLORIDE 0.9 % IJ SOLN
3.0000 mL | Freq: Two times a day (BID) | INTRAMUSCULAR | Status: DC
  Administered 2012-06-05: 3 mL via INTRAVENOUS

## 2012-06-04 MED ORDER — ALLOPURINOL 100 MG PO TABS
100.0000 mg | ORAL_TABLET | Freq: Three times a day (TID) | ORAL | Status: DC
  Administered 2012-06-04 – 2012-06-06 (×6): 100 mg via ORAL
  Filled 2012-06-04 (×11): qty 1

## 2012-06-04 MED ORDER — MORPHINE SULFATE 2 MG/ML IJ SOLN: 1.0000 mg | INTRAMUSCULAR | Status: DC | PRN

## 2012-06-04 MED ORDER — SODIUM CHLORIDE 0.9 % IV SOLN: 250.0000 mL | INTRAVENOUS | Status: DC | PRN

## 2012-06-04 MED ORDER — ENOXAPARIN SODIUM 40 MG/0.4ML ~~LOC~~ SOLN
40.0000 mg | SUBCUTANEOUS | Status: DC
  Administered 2012-06-04: 40 mg via SUBCUTANEOUS
  Filled 2012-06-04 (×2): qty 0.4

## 2012-06-04 MED ORDER — LINAGLIPTIN 5 MG PO TABS
5.0000 mg | ORAL_TABLET | Freq: Every day | ORAL | Status: DC
  Administered 2012-06-04 – 2012-06-06 (×2): 5 mg via ORAL
  Filled 2012-06-04 (×3): qty 1

## 2012-06-04 MED ORDER — SODIUM CHLORIDE 0.45 % IV SOLN
INTRAVENOUS | Status: DC
  Administered 2012-06-04 (×2): via INTRAVENOUS

## 2012-06-04 MED ORDER — NITROGLYCERIN 0.4 MG SL SUBL: 0.4000 mg | SUBLINGUAL_TABLET | SUBLINGUAL | Status: DC | PRN

## 2012-06-04 MED ORDER — ACETAMINOPHEN 325 MG PO TABS: 650.0000 mg | ORAL_TABLET | ORAL | Status: DC | PRN

## 2012-06-04 NOTE — Progress Notes (Signed)
[facility] PROGRESS NOTE  CLARKE PERETZ NWG:[id number] DOB: 1950/11/03 DOA: 06/03/2012 PCP: [name], MD  Assessment/Plan: Principal Problem:  *Chest pain Active Problems:  DM (diabetes mellitus)  HTN (hypertension)  Hyperlipidemia  1.Chest pain: Patient states that his chest pain is essentially resolved at this time. However he's had a mild elevation of cardiac enzymes. The patient is normally followed by Dr. Renford Dills. I discussed the case with Dr. Nehemiah Settle who agrees that given his risk factors, an evaluation by cardiology is appropriate. I've contacted he'll cardiology and Dr. Eldridge Dace will see the patient in consultation today. 2. Diabetes type 2: Blood sugars are well-controlled. Will defer to Dr. Nehemiah Settle about ordering hemoglobin A1c as he may have recent values done at the office. 3. Hyperlipidemia: Patient is continued on his Zocor  Code Status: Full code Family Communication: Wife at bedside and updated on all the above. Disposition Plan: Plan is to be discharged home in medically stable.  Jamill Wetmore A.  [facility] Pager [phone]. If 8PM-8AM, please contact night-coverage at [email or web address], password [facility] 06/04/2012, 5:29 PM  LOS: 1 day   Brief narrative: A 61 year old gentleman with history of diabetes hypertension hyperlipidemia presenting with chest pain. Symptoms started abruptly 2 weeks ago mainly in the epigastric to left-sided chest. Rated as 4-5/10. Feels like indigestion palpation. Worsened with food initially. Now even at rest. Has never had any heart problems. Has never been seen by cardiologist. And strong family history of heart disease patient however has significant risk factors for coronary artery disease. No radiation of his pain, no diaphoresis no shortness of breath no cough. Patient also denied any fever. Pain is not reproducible by pressing on the chest wall   Consultants:  Dr.  Varanasi-cardiology  Procedures:  None  Antibiotics:  None  HPI/Subjective: Patient laying comfortably in bed he reports that his pain is now resolved. He states that after having had 2 bowel movements the pain resolved completely and feels that his chest pain may have been associated with constipation.  Objective: Filed Vitals:   06/03/12 2312 06/04/12 0022 06/04/12 0500 06/04/12 1400  BP: 138/63 155/85 167/85 141/83  Pulse: 73 72 75 74  Temp: 97.5 F (36.4 C) 98.3 F (36.8 C) 98.3 F (36.8 C) 98 F (36.7 C)  TempSrc: Oral Oral Oral   Resp: 19   18  Height:  5\' 8"  (1.727 m)    Weight:  104.327 kg (230 lb)    SpO2: 93% 96% 95% 94%   Weight change:  No intake or output data in the 24 hours ending 06/04/12 1729  General: Alert, awake, oriented x3, in no acute distress.  HEENT: O[place]/AT PEERL, EOMI Neck: Trachea midline,  no masses, no thyromegal,y no JVD, no carotid bruit Heart: Regular rate and rhythm, without murmurs, rubs, gallops, PMI non-displaced, no heaves or thrills on palpation.  Lungs: Clear to auscultation.  Abdomen: Soft, nontender, nondistended, positive bowel sounds, no masses no hepatosplenomegaly noted.  Neuro: No focal neurological deficits noted .    Data Reviewed: Basic Metabolic Panel:  Lab 06/04/12 2952 06/04/12 0025 06/03/12 2049  NA 139 -- 141  K 4.3 -- 4.2  CL 103 -- 104  CO2 26 -- 26  GLUCOSE 168* -- 180*  BUN 21 -- 22  CREATININE 1.13 1.27 1.33  CALCIUM 9.5 -- 9.4  MG -- -- --  PHOS -- -- --   Liver Function Tests:  Lab 06/04/12 0830 06/03/12 2049  AST 21 17  ALT 16 16  ALKPHOS  53 51  BILITOT 0.6 0.3  PROT 7.1 6.8  ALBUMIN 4.0 3.7    Lab 06/03/12 2049  LIPASE 40  AMYLASE --   No results found for this basename: AMMONIA:5 in the last 168 hours CBC:  Lab 06/04/12 0830 06/04/12 0025 06/03/12 2049  WBC 8.4 7.6 8.1  NEUTROABS -- -- 5.7  HGB 16.3 15.2 14.9  HCT 47.9 44.9 43.3  MCV 88.5 88.9 88.5  PLT 181 166 174    Cardiac Enzymes:  Lab 06/04/12 1600 06/04/12 0830 06/04/12 0025  CKTOTAL 176 207 256*  CKMB 6.5* 6.9* 7.4*  CKMBINDEX -- -- --  TROPONINI 0.37* 0.41* 0.37*   BNP (last 3 results)  Basename 06/04/12 0025  PROBNP 21.6   CBG:  Lab 06/04/12 1645 06/04/12 1210 06/04/12 0748 06/04/12 0027 06/03/12 2315  GLUCAP 108* 117* 121* 127* 124*    No results found for this or any previous visit (from the past 240 hour(s)).   Studies: Dg Chest 2 View  06/03/2012  *RADIOLOGY REPORT*  Clinical Data: Chest pain, hypertension.  CHEST - 2 VIEW  Comparison: None.  Findings: Mild spurring in the mid thoracic spine. Lungs clear. Heart size and pulmonary vascularity normal.  No effusion.  IMPRESSION: No acute disease  Original Report Authenticated By: Thora Lance III, M.D.    Scheduled Meds:   . allopurinol  100 mg Oral TID  . amLODipine  5 mg Oral Daily  . aspirin EC  81 mg Oral Daily  . enoxaparin (LOVENOX) injection  40 mg Subcutaneous Q24H  . insulin glargine  25 Units Subcutaneous q morning - 10a  . irbesartan  150 mg Oral Daily  . linagliptin  5 mg Oral Daily  . metFORMIN  1,000 mg Oral BID WC  . simvastatin  10 mg Oral q1800  . sodium chloride  3 mL Intravenous Q12H  . DISCONTD: amLODipine-valsartan  1 tablet Oral Daily  . DISCONTD: sitaGLIPtan-metformin  1 tablet Oral BID WC   Continuous Infusions:   . sodium chloride 100 mL/hr at 06/04/12 1610    Principal Problem:  *Chest pain Active Problems:  DM (diabetes mellitus)  HTN (hypertension)  Hyperlipidemia

## 2012-06-04 NOTE — Progress Notes (Signed)
  Echocardiogram 2D Echocardiogram has been performed.  Gregory [name] 06/04/2012, 11:51 AM

## 2012-06-04 NOTE — Consult Note (Signed)
Admit date: 06/03/2012 Referring Physician  Dr. Nehemiah Settle Primary Physician  Dr. Nehemiah Settle Primary Cardiologist  Irving Shows Reason for Consultation  Chest discomfort-abnormal troponin  HPI: 61 y/o with h/o DM x 4 years who has been having indigestion on and off for a few days.  Bowel movements have helped relieve his symptoms.  Certain foods can make sx come on.  However, he did have exertional heart burn symptoms in the past few days, particularly when walking up stairs.  He walks a lot at work.  Currently, he feels better.  No further heart burn sx since yesterday.  HE has not walked much in the hospital due to being hooked to the IV.     PMH:   Past Medical History  Diagnosis Date  . Diabetes mellitus   . Hypertension   . Hyperlipidemia      PSH:  History reviewed. No pertinent past surgical history.  Allergies:  Review of patient's allergies indicates no known allergies. Prior to Admit Meds:   Prescriptions prior to admission  Medication Sig Dispense Refill  . allopurinol (ZYLOPRIM) 100 MG tablet Take 100 mg by mouth 3 (three) times daily.      . Amlodipine Besylate-Valsartan (EXFORGE PO) Take 1 tablet by mouth daily.       . insulin glargine (LANTUS) 100 UNIT/ML injection Inject 25 Units into the skin every morning.       . simvastatin (ZOCOR) 10 MG tablet Take 10 mg by mouth at bedtime.      . sitaGLIPtan-metformin (JANUMET) 50-1000 MG per tablet Take 1 tablet by mouth 2 (two) times daily with a meal.       Fam HX:   History reviewed. No pertinent family history. Social HX:    History   Social History  . Marital Status: Married    Spouse Name: N/A    Number of Children: N/A  . Years of Education: N/A   Occupational History  . Not on file.   Social History Main Topics  . Smoking status: Never Smoker   . Smokeless tobacco: Not on file  . Alcohol Use: No  . Drug Use: Not on file  . Sexually Active: Not on file   Other Topics Concern  . Not on file   Social History  Narrative  . No narrative on file     ROS:  All 11 ROS were addressed and are negative except what is stated in the HPI  Physical Exam: Blood pressure 141/83, pulse 74, temperature 98 F (36.7 C), temperature source Oral, resp. rate 18, height 5\' 8"  (1.727 m), weight 104.327 kg (230 lb), SpO2 94.00%.    General: Well developed, well nourished, in no acute distress Head:    Normal cephalic and atramatic  Lungs:   Clear bilaterally to auscultation and percussion. Heart:   HRRR S1 S2  Abdomen: Bowel sounds are positive, abdomen soft and non-tender without masses or                  Hernia's noted. Msk:  Back normal, normal gait. Normal strength and tone for age. Extremities:   Left ankle edema.   Neuro: Alert and oriented X 3. Psych:  Normal affect, responds appropriately    Labs:   Lab Results  Component Value Date   WBC 8.4 06/04/2012   HGB 16.3 06/04/2012   HCT 47.9 06/04/2012   MCV 88.5 06/04/2012   PLT 181 06/04/2012    Lab 06/04/12 0830  NA 139  K 4.3  CL 103  CO2 26  BUN 21  CREATININE 1.13  CALCIUM 9.5  PROT 7.1  BILITOT 0.6  ALKPHOS 53  ALT 16  AST 21  GLUCOSE 168*   No results found for this basename: PTT   No results found for this basename: INR, PROTIME   Lab Results  Component Value Date   CKTOTAL 176 06/04/2012   CKMB 6.5* 06/04/2012   TROPONINI 0.37* 06/04/2012     No results found for this basename: CHOL   No results found for this basename: HDL   No results found for this basename: LDLCALC   No results found for this basename: TRIG   No results found for this basename: CHOLHDL   No results found for this basename: LDLDIRECT      Radiology:  Dg Chest 2 View  06/03/2012  *RADIOLOGY REPORT*  Clinical Data: Chest pain, hypertension.  CHEST - 2 VIEW  Comparison: None.  Findings: Mild spurring in the mid thoracic spine. Lungs clear. Heart size and pulmonary vascularity normal.  No effusion.  IMPRESSION: No acute disease  Original Report  Authenticated By: Thora Lance III, M.D.    EKG:  NSR, non specific ST segment changes  ASSESSMENT: NSTEMI.  Some exertional chest discomfort with h/o diabetes, positive troponin  PLAN:  We discussed further cardiac w/u with cath vs. Stress.  We are in agreement that cath would be the best strategy given how long his sx have lasted and his positive enzymes.  Risks and benefits of the procedure were explained the patient and he agrees.  Questions answered.  Further plans based on cath result.  Continue BP control.  He would benefot from weight loss as well.  LDL target < 100.  Corky Crafts., MD  06/04/2012  6:24 PM

## 2012-06-04 NOTE — Progress Notes (Signed)
Utilization review complete 

## 2012-06-04 NOTE — ED Provider Notes (Signed)
I saw and evaluated the patient, reviewed Dr. Daleen Bo note and I agree with the findings and plan.  The patient presents to the ER complaining of chest pain which has occurring intermittently for the past 1-2 weeks.  He denies fever, cough, or shortness of breath.  There is also no exertional component that he admits to.  On exam, the vitals are stable and the patient is afebrile.  The heart and lungs are unremarkable and the abdomen is benign.  There is no edema.  The workup reveals and ekg that is unremarkable and negative troponin.  As this patient does have significant risk factors, internal medicine will be consulted for admission and further workup.    Geoffery Lyons, MD 06/04/12 0130

## 2012-06-04 NOTE — Progress Notes (Addendum)
CRITICAL VALUE ALERT  Critical value received: CKMB- 7.4,  Troponin 0.37  Date of notification:  06/04/12  Time of notification:  130AM  Critical value read back: yes  Nurse who received alert:  Lenna Gilford, RN  MD notified (1st page):  132AM  Time of first page:  132AM  MD notified (2nd page):  Time of second page:  Responding MD:  [place]  Time MD responded: 135AM

## 2012-06-05 ENCOUNTER — Encounter (HOSPITAL_COMMUNITY)
Admission: EM | Disposition: A | Discharge status: Home / Self Care | Payer: Self-pay | Source: Home / Self Care | Attending: Internal Medicine

## 2012-06-05 ENCOUNTER — Encounter (HOSPITAL_COMMUNITY): Payer: Self-pay | Admitting: General Practice

## 2012-06-05 HISTORY — PX: CORONARY ANGIOPLASTY WITH STENT PLACEMENT: SHX49

## 2012-06-05 HISTORY — PX: LEFT HEART CATHETERIZATION WITH CORONARY ANGIOGRAM: SHX5451

## 2012-06-05 LAB — PROTIME-INR
INR: 1.01 (ref 0.00–1.49)
Prothrombin Time: 13.5 seconds (ref 11.6–15.2)

## 2012-06-05 LAB — GLUCOSE, CAPILLARY
Glucose-Capillary: 95 mg/dL (ref 70–99)
Glucose-Capillary: 95 mg/dL (ref 70–99)
Glucose-Capillary: 292 mg/dL — ABNORMAL HIGH (ref 70–99)
Glucose-Capillary: 171 mg/dL — ABNORMAL HIGH (ref 70–99)

## 2012-06-05 LAB — POCT ACTIVATED CLOTTING TIME: Activated Clotting Time: 474 seconds

## 2012-06-05 SURGERY — LEFT HEART CATHETERIZATION WITH CORONARY ANGIOGRAM
Anesthesia: Moderate Sedation

## 2012-06-05 SURGERY — LEFT HEART CATHETERIZATION WITH CORONARY ANGIOGRAM
Anesthesia: LOCAL

## 2012-06-05 MED ORDER — BIVALIRUDIN 250 MG IV SOLR
INTRAVENOUS | Status: AC
  Filled 2012-06-05: qty 250

## 2012-06-05 MED ORDER — MIDAZOLAM HCL 2 MG/2ML IJ SOLN
INTRAMUSCULAR | Status: AC
  Filled 2012-06-05: qty 2

## 2012-06-05 MED ORDER — INSULIN ASPART 100 UNIT/ML ~~LOC~~ SOLN: 0.0000 [IU] | Freq: Every day | SUBCUTANEOUS | Status: DC

## 2012-06-05 MED ORDER — ACETAMINOPHEN 325 MG PO TABS: 650.0000 mg | ORAL_TABLET | ORAL | Status: DC | PRN

## 2012-06-05 MED ORDER — TICAGRELOR 90 MG PO TABS
ORAL_TABLET | ORAL | Status: AC
  Filled 2012-06-05: qty 2

## 2012-06-05 MED ORDER — METFORMIN HCL 500 MG PO TABS
1000.0000 mg | ORAL_TABLET | Freq: Two times a day (BID) | ORAL | Status: DC
  Filled 2012-06-05: qty 2

## 2012-06-05 MED ORDER — ONDANSETRON HCL 4 MG/2ML IJ SOLN: 4.0000 mg | Freq: Four times a day (QID) | INTRAMUSCULAR | Status: DC | PRN

## 2012-06-05 MED ORDER — NITROGLYCERIN 0.2 MG/ML ON CALL CATH LAB
INTRAVENOUS | Status: AC
  Filled 2012-06-05: qty 1

## 2012-06-05 MED ORDER — ASPIRIN 81 MG PO CHEW: 81.0000 mg | CHEWABLE_TABLET | Freq: Every day | ORAL | Status: DC

## 2012-06-05 MED ORDER — VERAPAMIL HCL 2.5 MG/ML IV SOLN
INTRAVENOUS | Status: AC
  Filled 2012-06-05: qty 2

## 2012-06-05 MED ORDER — HEPARIN (PORCINE) IN NACL 2-0.9 UNIT/ML-% IJ SOLN
INTRAMUSCULAR | Status: AC
  Filled 2012-06-05: qty 2000

## 2012-06-05 MED ORDER — FENTANYL CITRATE 0.05 MG/ML IJ SOLN
INTRAMUSCULAR | Status: AC
  Filled 2012-06-05: qty 2

## 2012-06-05 MED ORDER — LIDOCAINE HCL (PF) 1 % IJ SOLN
INTRAMUSCULAR | Status: AC
  Filled 2012-06-05: qty 30

## 2012-06-05 MED ORDER — ATORVASTATIN CALCIUM 10 MG PO TABS
10.0000 mg | ORAL_TABLET | Freq: Every day | ORAL | Status: DC
  Administered 2012-06-05: 10 mg via ORAL
  Filled 2012-06-05 (×3): qty 1

## 2012-06-05 MED ORDER — HEPARIN SODIUM (PORCINE) 1000 UNIT/ML IJ SOLN
INTRAMUSCULAR | Status: AC
  Filled 2012-06-05: qty 1

## 2012-06-05 MED ORDER — TICAGRELOR 90 MG PO TABS
90.0000 mg | ORAL_TABLET | Freq: Two times a day (BID) | ORAL | Status: DC
  Administered 2012-06-05 – 2012-06-06 (×2): 90 mg via ORAL
  Filled 2012-06-05 (×3): qty 1

## 2012-06-05 MED ORDER — METOPROLOL TARTRATE 12.5 MG HALF TABLET
12.5000 mg | ORAL_TABLET | Freq: Two times a day (BID) | ORAL | Status: DC
  Administered 2012-06-05 – 2012-06-06 (×2): 12.5 mg via ORAL
  Filled 2012-06-05 (×3): qty 1

## 2012-06-05 MED ORDER — INSULIN ASPART 100 UNIT/ML ~~LOC~~ SOLN
0.0000 [IU] | Freq: Three times a day (TID) | SUBCUTANEOUS | Status: DC
  Administered 2012-06-06: 2 [IU] via SUBCUTANEOUS

## 2012-06-05 MED ORDER — TICAGRELOR 90 MG PO TABS: 90.0000 mg | ORAL_TABLET | Freq: Two times a day (BID) | ORAL | Status: DC

## 2012-06-05 NOTE — Progress Notes (Signed)
Subjective: Admission H&P reviewed, patient without any current chest pain, troponin I mildly elevated, plans for heart catheterization noted  Objective: Vital signs in last 24 hours: Temp:  [97.9 F (36.6 C)-98 F (36.7 C)] 97.9 F (36.6 C) (08/14 0500) Pulse Rate:  [72-76] 72  (08/14 1007) Resp:  [18-20] 20  (08/14 0500) BP: (134-160)/(65-85) 134/65 mmHg (08/14 1007) SpO2:  [94 %-97 %] 95 % (08/14 0500) Weight:  [103.012 kg (227 lb 1.6 oz)] 103.012 kg (227 lb 1.6 oz) (08/14 0500) Weight change: -1.315 kg (-2 lb 14.4 oz) Last BM Date: 06/04/12  Intake/Output from previous day: 08/13 0701 - 08/14 0700 In: 600 [P.O.:600] Out: -  Intake/Output this shift:    General appearance: alert and cooperative Resp: clear to auscultation bilaterally Cardio: regular rate and rhythm, S1, S2 normal, no murmur, click, rub or gallop Extremities: extremities normal, atraumatic, no cyanosis or edema  Lab Results:  Results for orders placed during the hospital encounter of 06/03/12 (from the past 24 hour(s))  CARDIAC PANEL(CRET KIN+CKTOT+MB+TROPI)     Status: Abnormal   Collection Time   06/04/12  4:00 PM      Component Value Range   Total CK 176  7 - 232 U/L   CK, MB 6.5 (*) 0.3 - 4.0 ng/mL   Troponin I 0.37 (*) <0.30 ng/mL   Relative Index 3.7 (*) 0.0 - 2.5  GLUCOSE, CAPILLARY     Status: Abnormal   Collection Time   06/04/12  4:45 PM      Component Value Range   Glucose-Capillary 108 (*) 70 - 99 mg/dL  GLUCOSE, CAPILLARY     Status: Abnormal   Collection Time   06/04/12  8:21 PM      Component Value Range   Glucose-Capillary 108 (*) 70 - 99 mg/dL   Comment 1 Notify RN    PROTIME-INR     Status: Normal   Collection Time   06/05/12  7:03 AM      Component Value Range   Prothrombin Time 13.5  11.6 - 15.2 seconds   INR 1.01  0.00 - 1.49  GLUCOSE, CAPILLARY     Status: Normal   Collection Time   06/05/12  7:27 AM      Component Value Range   Glucose-Capillary 95  70 - 99 mg/dL   Comment 1 Notify RN        Studies/Results: Dg Chest 2 View  06/03/2012  *RADIOLOGY REPORT*  Clinical Data: Chest pain, hypertension.  CHEST - 2 VIEW  Comparison: None.  Findings: Mild spurring in the mid thoracic spine. Lungs clear. Heart size and pulmonary vascularity normal.  No effusion.  IMPRESSION: No acute disease  Original Report Authenticated By: Osa Craver, M.D.    Medications:  Prior to Admission:  Prescriptions prior to admission  Medication Sig Dispense Refill  . allopurinol (ZYLOPRIM) 100 MG tablet Take 100 mg by mouth 3 (three) times daily.      . Amlodipine Besylate-Valsartan (EXFORGE PO) Take 1 tablet by mouth daily.       . insulin glargine (LANTUS) 100 UNIT/ML injection Inject 25 Units into the skin every morning.       . simvastatin (ZOCOR) 10 MG tablet Take 10 mg by mouth at bedtime.      . sitaGLIPtan-metformin (JANUMET) 50-1000 MG per tablet Take 1 tablet by mouth 2 (two) times daily with a meal.       Scheduled:   . allopurinol  100 mg Oral TID  .  amLODipine  5 mg Oral Daily  . aspirin  324 mg Oral Pre-Cath  . aspirin EC  81 mg Oral Daily  . bivalirudin      . diazepam  5 mg Oral On Call  . enoxaparin (LOVENOX) injection  40 mg Subcutaneous Q24H  . fentaNYL      . heparin      . heparin      . insulin glargine  25 Units Subcutaneous q morning - 10a  . irbesartan  150 mg Oral Daily  . lidocaine      . linagliptin  5 mg Oral Daily  . metFORMIN  1,000 mg Oral BID WC  . midazolam      . nitroGLYCERIN      . simvastatin  10 mg Oral q1800  . sodium chloride  3 mL Intravenous Q12H  . sodium chloride  3 mL Intravenous Q12H  . Ticagrelor      . verapamil      . DISCONTD: aspirin EC  81 mg Oral Daily   Continuous:   . sodium chloride 100 mL/hr at 06/04/12 0955  . sodium chloride 75 mL/hr at 06/05/12 0846    Assessment/Plan: @HPROL @  Principal Problem:  *Chest pain Active Problems:  DM (diabetes mellitus)  HTN (hypertension)   Hyperlipidemia obesity Gout Await heart catheterization, further recommendations/disposition pending results    LOS: 2 days   Gregory [name] D 06/05/2012, 1:06 PM

## 2012-06-05 NOTE — CV Procedure (Signed)
PROCEDURE:  Left heart catheterization with selective coronary angiography, left ventriculogram.  PCI LAD  INDICATIONS:  NSTEMI  The risks, benefits, and details of the procedure were explained to the patient.  The patient verbalized understanding and wanted to proceed.  Informed written consent was obtained.  PROCEDURE TECHNIQUE:  After Xylocaine anesthesia a 43F sheath was placed in the right femoral artery with a single anterior needle wall stick.   Left coronary angiography was done using a Judkins L3.5 guide catheter.  Right coronary angiography was done using a Judkins R4 guide catheter.  Left ventriculography was done using a pigtail catheter.    CONTRAST:  Total of 170 cc.  COMPLICATIONS:  None.    HEMODYNAMICS:  Aortic pressure was 108/71; LV pressure was 110/6; LVEDP 15.  There was no gradient between the left ventricle and aorta.    ANGIOGRAPHIC DATA:   The left main coronary artery is widely patent.  The left anterior descending artery is a large vessel proximally.  After a medium sized first diagonal, there is a hazy, ulcerated plaque causing a 90% stenosis.  There is a long segment of moderate diffuse disease beyond this area.  The distal vessel has moderate diffuse disease with a few areas of severe stenosis close to the apex.  The second diagonal is small and patent.   The left circumflex artery is a large vessel.  There is a small first obtuse marginal which is widely patent.  The second obtuse marginal is small but patent.  The third obtuse marginal is a large branching vessel which is widely patent.  The remainder of the circumflex appears widely patent.  There are 2 additional obtuse marginals which are medium-sized and widely patent.  The right coronary artery is a medium-size vessel.  The take off is more tortuous than usual.  There only mild luminal irregularities.  The PDA is a medium-sized vessel and is widely patent.  The posterior lateral artery is very small.Marland Kitchen  LEFT  VENTRICULOGRAM:  Left ventricular angiogram was done in the 30 RAO projection and revealed normal left ventricular wall motion and systolic function with an estimated ejection fraction of 55%.  LVEDP was 15 mmHg.  PCI NARRATIVE:  An XB 3 guiding catheter was used to engage the left main.  A pro-water wire was placed across the area of disease in the LAD.  A 2.5 x 20 balloon was used to predilate the area of most significant stenosis in the mid LAD.  A 3.0 x 38 Promus element stent was then deployed in the more distal portion of the mid LAD disease.  This long stent did not cover the entire diseased area.  The actual ulcerated plaque was only partially covered.  A 3.5 x 12 Promus element stent was then deployed in overlapping fashion at the proximal edge of the stent.  The entire stented area was postdilated with a 3.75 x 20 noncompliant balloon.  This led to an excellent angiographic result.  There is no residual stenosis.  TIMI-3 flow was maintained throughout.  Several doses of intracoronary nitroglycerin were given.  This seemed to improve the appearance of the distal LAD which also had some areas of severe disease, particularly at the apex.  The vessel there may have been somewhat underfilled.  IMPRESSIONS:  1. Widely patent left main coronary artery. 2. 90% stenosis in the mid left anterior descending artery which is likely the culprit.  There was also moderate disease past this focal area.  This was all treated  with 2 overlapping drug-eluting stents, 3.0 x 38 mm and 3.5 x 12 mm the entire stented area was postdilated with a 3.75 balloon.  There is apical LAD disease which will be managed medically.  Patent diagonal branches. 3. Widely patent, large left circumflex artery and its branches. 4. Widely patent right coronary artery. 5. Normal left ventricular systolic function.  LVEDP 15 mmHg.  Ejection fraction 55 %.  RECOMMENDATION:  The patient will require dual antiplatelet therapy for at least a  year.  He'll need aggressive blood pressure control and lipid-lowering therapy.  LDL target is 70.  Will adjust lipid-lowering therapy to achieve this goal.  If he does have persistent angina from the apical LAD disease, would start nitrates.  However, I think he will feel better now that this larger portion of the vessel is opened.

## 2012-06-05 NOTE — Progress Notes (Signed)
Dr. Eldridge Dace paged re Lovenox inj and diabetic meds.  Will hold these meds for now, per MD request.  Pt going for heart cath around noon today.

## 2012-06-05 NOTE — Progress Notes (Signed)
SUBJECTIVE:  No further chest pain  OBJECTIVE:   Vitals:   Filed Vitals:   06/05/12 0500 06/05/12 1007 06/05/12 1145 06/05/12 1538  BP: 140/81 134/65  150/73  Pulse: 76 72 69 83  Temp: 97.9 F (36.6 C)   97.5 F (36.4 C)  TempSrc: Oral   Oral  Resp: 20   24  Height:      Weight: 103.012 kg (227 lb 1.6 oz)     SpO2: 95%   99%   I&O's:   Intake/Output Summary (Last 24 hours) at 06/05/12 1602 Last data filed at 06/05/12 1500  Gross per 24 hour  Intake    360 ml  Output      0 ml  Net    360 ml   TELEMETRY: Reviewed telemetry pt in normal sinus rhythm:     PHYSICAL EXAM General: Well developed, well nourished, in no acute distress Head:   Normal cephalic and atramatic  Lungs:  No wheezing Heart:   HRRR  Abdomen: Mildly obese Msk:  Back normal,  Normal strength and tone for age. Extremities:   No  edema.   Neuro: Alert and oriented X 3. Psych:  Normal affect, responds appropriately   LABS: Basic Metabolic Panel:  Basename 06/04/12 0830 06/04/12 0025 06/03/12 2049  NA 139 -- 141  K 4.3 -- 4.2  CL 103 -- 104  CO2 26 -- 26  GLUCOSE 168* -- 180*  BUN 21 -- 22  CREATININE 1.13 1.27 --  CALCIUM 9.5 -- 9.4  MG -- -- --  PHOS -- -- --   Liver Function Tests:  Basename 06/04/12 0830 06/03/12 2049  AST 21 17  ALT 16 16  ALKPHOS 53 51  BILITOT 0.6 0.3  PROT 7.1 6.8  ALBUMIN 4.0 3.7    Basename 06/03/12 2049  LIPASE 40  AMYLASE --   CBC:  Basename 06/04/12 0830 06/04/12 0025 06/03/12 2049  WBC 8.4 7.6 --  NEUTROABS -- -- 5.7  HGB 16.3 15.2 --  HCT 47.9 44.9 --  MCV 88.5 88.9 --  PLT 181 166 --   Cardiac Enzymes:  Basename 06/04/12 1600 06/04/12 0830 06/04/12 0025  CKTOTAL 176 207 256*  CKMB 6.5* 6.9* 7.4*  CKMBINDEX -- -- --  TROPONINI 0.37* 0.41* 0.37*   BNP: No components found with this basename: POCBNP:3 D-Dimer: No results found for this basename: DDIMER:2 in the last 72 hours Hemoglobin A1C: No results found for this basename: HGBA1C  in the last 72 hours Fasting Lipid Panel: No results found for this basename: CHOL,HDL,LDLCALC,TRIG,CHOLHDL,LDLDIRECT in the last 72 hours Thyroid Function Tests:  Basename 06/04/12 0025  TSH 2.533  T4TOTAL --  T3FREE --  THYROIDAB --   Anemia Panel: No results found for this basename: VITAMINB12,FOLATE,FERRITIN,TIBC,IRON,RETICCTPCT in the last 72 hours Coag Panel:   Lab Results  Component Value Date   INR 1.01 06/05/2012    RADIOLOGY: Dg Chest 2 View  06/03/2012  *RADIOLOGY REPORT*  Clinical Data: Chest pain, hypertension.  CHEST - 2 VIEW  Comparison: None.  Findings: Mild spurring in the mid thoracic spine. Lungs clear. Heart size and pulmonary vascularity normal.  No effusion.  IMPRESSION: No acute disease  Original Report Authenticated By: Osa Craver, M.D.      ASSESSMENT: Status post non-STEMI.  Overlapping drug-eluting stents to the LAD.  PLAN:  Needs dual antiplatelet therapy.  Last LDL was 97 in May of 2013 on lipid panel done in the office.  LDL target is now  70.  Will change simvastatin to atorvastatin 10 mg daily.  There is an interaction with simvastatin and amlodipine.  radial pulse appears patent post catheterization.  Plan for discharge tomorrow if there are no complications.  Corky Crafts., MD  06/05/2012  4:02 PM

## 2012-06-05 NOTE — H&P (Signed)
  Date of Initial H&P: 06/04/12  History reviewed, patient examined, no change in status, stable for surgery.

## 2012-06-05 NOTE — Progress Notes (Signed)
Utilization Review Completed.  

## 2012-06-06 LAB — BASIC METABOLIC PANEL
BUN: 19 mg/dL (ref 6–23)
CO2: 28 mEq/L (ref 19–32)
Calcium: 9.8 mg/dL (ref 8.4–10.5)
Chloride: 102 mEq/L (ref 96–112)
Creatinine, Ser: 1.27 mg/dL (ref 0.50–1.35)
GFR calc Af Amer: 68 mL/min — ABNORMAL LOW (ref >90)
GFR calc non Af Amer: 59 mL/min — ABNORMAL LOW (ref >90)
Glucose, Bld: 143 mg/dL — ABNORMAL HIGH (ref 70–99)
Potassium: 4.6 mEq/L (ref 3.5–5.1)
Sodium: 139 mEq/L (ref 135–145)

## 2012-06-06 LAB — CBC
HCT: 46 % (ref 39.0–52.0)
Hemoglobin: 15.8 g/dL (ref 13.0–17.0)
MCH: 30.1 pg (ref 26.0–34.0)
MCHC: 34.3 g/dL (ref 30.0–36.0)
MCV: 87.6 fL (ref 78.0–100.0)
Platelets: 186 10*3/uL (ref 150–400)
RBC: 5.25 MIL/uL (ref 4.22–5.81)
RDW: 13.6 % (ref 11.5–15.5)
WBC: 9 10*3/uL (ref 4.0–10.5)

## 2012-06-06 LAB — GLUCOSE, CAPILLARY: Glucose-Capillary: 150 mg/dL — ABNORMAL HIGH (ref 70–99)

## 2012-06-06 MED ORDER — ASPIRIN 81 MG PO TBEC: 81.0000 mg | DELAYED_RELEASE_TABLET | Freq: Every day | ORAL | Status: AC

## 2012-06-06 MED ORDER — NITROGLYCERIN 0.4 MG SL SUBL: 0.4000 mg | SUBLINGUAL_TABLET | SUBLINGUAL | Status: DC | PRN

## 2012-06-06 MED ORDER — ATORVASTATIN CALCIUM 10 MG PO TABS: 10.0000 mg | ORAL_TABLET | Freq: Every day | ORAL | Status: DC

## 2012-06-06 MED ORDER — METOPROLOL TARTRATE 12.5 MG HALF TABLET: 25.0000 mg | ORAL_TABLET | Freq: Two times a day (BID) | ORAL | Status: DC

## 2012-06-06 MED ORDER — TICAGRELOR 90 MG PO TABS: 90.0000 mg | ORAL_TABLET | Freq: Two times a day (BID) | ORAL | Status: DC

## 2012-06-06 MED ORDER — SITAGLIPTIN PHOS-METFORMIN HCL 50-1000 MG PO TABS: 1.0000 | ORAL_TABLET | Freq: Two times a day (BID) | ORAL | Status: DC

## 2012-06-06 MED FILL — Dextrose Inj 5%: INTRAVENOUS | Qty: 1000 | Status: AC

## 2012-06-06 NOTE — Discharge Summary (Signed)
Patient ID: Gregory [name] MRN: [id number] DOB/AGE: 1951-08-29 61 y.o.  Admit date: 06/03/2012 Discharge date: 06/06/2012  Primary Discharge Diagnosis: Myocardial infarction, non-ST elevation MI Secondary Discharge Diagnosis CAD, HTN, HYperlipidemia, Diabetes  Significant Diagnostic Studies: angiography: This revealed a long area of disease in the mid LAD.  At the beginning of this long segment, there is a focal 90% ulcerated plaque.  In the distal LAD, there is also a long segment of moderate disease with several focal areas of severe disease in the apical LAD.  2 overlapping drug-eluting stents were placed in the mid LAD.  The distal/apical LAD we managed medically.  Echocardiogram on 06/04/2012 showing normal left ventricular function with normal valvular function.  There is evidence of diastolic dysfunction.  Consults: cardiology  Hospital Course: The patient was admitted for symptoms worrisome for unstable angina.  His troponins were elevated.  Cardiology consult was obtained.  He underwent cardiac catheterization and had 2 drug-eluting stents placed in the LAD as noted above.  His left ventricular function was normal.  He tolerated the procedure well.  He walked the next day without any symptoms of indigestion.  Indigestion was his anginal equivalent.  It was occurring with exertion prior to the angiogram.  There were no bleeding problems.  We discussed him returning to work.  He works at [employer] and does have to do heavy lifting.  Therefore, will have him out of work for at least a week.  His blood pressure was somewhat high prior to his receiving his medications.  He had elevated blood sugars and required sliding scale insulins.  If his blood pressure stays elevated as an outpatient, could increase dosage of exforge.   Discharge Exam: Blood pressure 149/93, pulse 96, temperature 98.1 F (36.7 C), temperature source Oral, resp. rate 18, height 5\' 8"  (1.727 m), weight 101.6 kg  (223 lb 15.8 oz), SpO2 95.00%.  /AT RRR, S1-S2 CTA bilaterally 3+ right radial pulse, no hematoma Trivial left ankle edema, chronic   Labs:   Lab Results  Component Value Date   WBC 9.0 06/06/2012   HGB 15.8 06/06/2012   HCT 46.0 06/06/2012   MCV 87.6 06/06/2012   PLT 186 06/06/2012    Lab 06/06/12 0630 06/04/12 0830  NA 139 --  K 4.6 --  CL 102 --  CO2 28 --  BUN 19 --  CREATININE 1.27 --  CALCIUM 9.8 --  PROT -- 7.1  BILITOT -- 0.6  ALKPHOS -- 53  ALT -- 16  AST -- 21  GLUCOSE 143* --   Lab Results  Component Value Date   CKTOTAL 176 06/04/2012   CKMB 6.5* 06/04/2012   TROPONINI 0.37* 06/04/2012    No results found for this basename: CHOL   No results found for this basename: HDL   No results found for this basename: LDLCALC   No results found for this basename: TRIG   No results found for this basename: CHOLHDL   No results found for this basename: LDLDIRECT      Radiology: No acute disease on chest x-ray EKG: Normal sinus rhythm with nonspecific ST-T wave changes laterally  FOLLOW UP PLANS AND APPOINTMENTS Discharge Orders    Future Orders Please Complete By Expires   Amb Referral to Cardiac Rehabilitation        Medication List  As of 06/06/2012 10:25 AM   STOP taking these medications         simvastatin 10 MG tablet  TAKE these medications         allopurinol 100 MG tablet   Commonly known as: ZYLOPRIM   Take 100 mg by mouth 3 (three) times daily.      aspirin 81 MG EC tablet   Take 1 tablet (81 mg total) by mouth daily.      atorvastatin 10 MG tablet   Commonly known as: LIPITOR   Take 1 tablet (10 mg total) by mouth daily at 6 PM.      EXFORGE PO   Take 1 tablet by mouth daily.      insulin glargine 100 UNIT/ML injection   Commonly known as: LANTUS   Inject 25 Units into the skin every morning.      metoprolol tartrate 12.5 mg Tabs   Commonly known as: LOPRESSOR   Take 1 tablet (25 mg total) by mouth 2 (two) times  daily.      nitroGLYCERIN 0.4 MG SL tablet   Commonly known as: NITROSTAT   Place 1 tablet (0.4 mg total) under the tongue every 5 (five) minutes x 3 doses as needed for chest pain.      sitaGLIPtan-metformin 50-1000 MG per tablet   Commonly known as: JANUMET   Take 1 tablet by mouth 2 (two) times daily with a meal.   Start taking on: 06/07/2012      Ticagrelor 90 MG Tabs tablet   Commonly known as: BRILINTA   Take 1 tablet (90 mg total) by mouth 2 (two) times daily.           Follow-up Information    Follow up with POLITE,RONALD D, MD. Schedule an appointment as soon as possible for a visit in 1 week.   Contact information:   [address] [place] [address] [phone]       Follow up with Corky Crafts., MD. Schedule an appointment as soon as possible for a visit in 3 weeks.   Contact information:   [address] [place] [address] [phone]          BRING ALL MEDICATIONS WITH YOU TO FOLLOW UP APPOINTMENTS  Time spent with patient to include physician time: 25 minutes Signed: Alisabeth Selkirk S. 06/06/2012, 10:25 AM

## 2012-06-06 NOTE — Progress Notes (Signed)
CARDIAC REHAB PHASE I   PRE:  Rate/Rhythm: 99SR  BP:  Supine:   Sitting: 149/93  Standing:    SaO2:   MODE:  Ambulation: 800 ft   POST:  Rate/Rhythem: 109ST  BP:  Supine:   Sitting: 146/95  Standing:    SaO2:  0750-0859 Pt walked 800 ft with steady gait. Denied CP. Tolerated well. Education completed. Permission given to refer to [facility]. Pt states he is already connected to [facility]. Has not been checking sugars lately because he ran out of strips. He knows to contact [facility] for asst.   Duanne Limerick

## 2012-08-26 ENCOUNTER — Ambulatory Visit (INDEPENDENT_AMBULATORY_CARE_PROVIDER_SITE_OTHER): Payer: 59 | Admitting: Family Medicine

## 2012-08-26 ENCOUNTER — Encounter (HOSPITAL_COMMUNITY): Payer: 59

## 2012-08-26 DIAGNOSIS — E119 Type 2 diabetes mellitus without complications: Secondary | ICD-10-CM

## 2012-08-26 NOTE — Progress Notes (Signed)
Patient presents today for 3 month DM follow-up as part of the employer-sponsored Link to Wellness program. Medications, insulin regimen, and glucose readings have been reviewed. I have also discussed with patient lifestyle interventions such as diet and physical activity. Details of this visit can be found in Caretracker documenting program through [facility] Patient has set a series of personal goals and will follow-up in 3 months for further review of DM.  

## 2012-08-28 ENCOUNTER — Encounter (HOSPITAL_COMMUNITY): Payer: 59

## 2012-08-29 ENCOUNTER — Inpatient Hospital Stay (HOSPITAL_COMMUNITY): Admission: RE | Admit: 2012-08-29 | Payer: Self-pay | Source: Ambulatory Visit

## 2012-09-02 ENCOUNTER — Encounter (HOSPITAL_COMMUNITY): Payer: 59

## 2012-09-03 ENCOUNTER — Encounter: Payer: Self-pay | Admitting: Family Medicine

## 2012-09-03 NOTE — Progress Notes (Signed)
Patient ID: Gregory [name], male   DOB: 1950/11/27, 61 y.o.   MRN: [id number] Reviewed and agree with documentation and management.

## 2012-09-04 ENCOUNTER — Encounter (HOSPITAL_COMMUNITY): Payer: 59

## 2012-09-05 ENCOUNTER — Encounter (HOSPITAL_COMMUNITY)
Admission: RE | Admit: 2012-09-05 | Discharge: 2012-09-05 | Disposition: A | Discharge status: Home / Self Care | Payer: 59 | Source: Ambulatory Visit | Attending: Interventional Cardiology | Admitting: Interventional Cardiology

## 2012-09-05 DIAGNOSIS — E119 Type 2 diabetes mellitus without complications: Secondary | ICD-10-CM | POA: Insufficient documentation

## 2012-09-05 DIAGNOSIS — E669 Obesity, unspecified: Secondary | ICD-10-CM | POA: Insufficient documentation

## 2012-09-05 DIAGNOSIS — Z794 Long term (current) use of insulin: Secondary | ICD-10-CM | POA: Insufficient documentation

## 2012-09-05 DIAGNOSIS — I10 Essential (primary) hypertension: Secondary | ICD-10-CM | POA: Insufficient documentation

## 2012-09-05 DIAGNOSIS — E785 Hyperlipidemia, unspecified: Secondary | ICD-10-CM | POA: Insufficient documentation

## 2012-09-05 DIAGNOSIS — I252 Old myocardial infarction: Secondary | ICD-10-CM | POA: Insufficient documentation

## 2012-09-05 DIAGNOSIS — Z9861 Coronary angioplasty status: Secondary | ICD-10-CM | POA: Insufficient documentation

## 2012-09-05 DIAGNOSIS — Z5189 Encounter for other specified aftercare: Secondary | ICD-10-CM | POA: Insufficient documentation

## 2012-09-05 DIAGNOSIS — I2510 Atherosclerotic heart disease of native coronary artery without angina pectoris: Secondary | ICD-10-CM | POA: Insufficient documentation

## 2012-09-05 DIAGNOSIS — M109 Gout, unspecified: Secondary | ICD-10-CM | POA: Insufficient documentation

## 2012-09-05 NOTE — Progress Notes (Signed)
Cardiac Rehab Medication Review by a Pharmacist  Does the patient  feel that his/her medications are working for him/her?  yes  Has the patient been experiencing any side effects to the medications prescribed?  no  Does the patient measure his/her own blood pressure or blood glucose at home?  yes   Does the patient have any problems obtaining medications due to transportation or finances?   no  Understanding of regimen: excellent Understanding of indications: excellent Potential of compliance: excellent    Pharmacist comments: none    Laurence Slate 09/05/2012 8:24 AM

## 2012-09-09 ENCOUNTER — Encounter (HOSPITAL_COMMUNITY): Admission: RE | Admit: 2012-09-09 | Payer: 59 | Source: Ambulatory Visit

## 2012-09-09 LAB — GLUCOSE, CAPILLARY: Glucose-Capillary: 104 mg/dL — ABNORMAL HIGH (ref 70–99)

## 2012-09-09 NOTE — Progress Notes (Signed)
Pt arrived early to his first exercise session.  Pt needed help completing his homework.  Pt reported that he had not eaten because he was having lab work at 9 for his "cholesterol".  Pt questioned regarding his medications.  Pt stated that he took all his medications including his diabetic medications including januvia, metformin and lantus.  Pt questioned regarding his home blood glucose.  Pt stated that it was 90.  Pt typically runs in the 90's for his fasting blood glucose.  Pt was unaware that he should not take his diabetic medications and not eat.  Pt instructed on the purpose of diabetes medications and the subsequent effect on glucose readings. Pt stated that he had never heard the information before.  Pt advised not to exercise per our rehab policy.  Pt is to call the lab and see if he can come in earlier for his labwork so that his blood glucose will not fail dangerously low.  Pt did not have the contact information for the lab with him and seemed unsure of where to go he stated that his wife had the information.   Pt will return on Wednesday to start his exercise. Note faxed to Dr. Eldridge Dace with update of this mornings events

## 2012-09-11 ENCOUNTER — Encounter (HOSPITAL_COMMUNITY)
Admission: RE | Admit: 2012-09-11 | Discharge: 2012-09-11 | Disposition: A | Discharge status: Home / Self Care | Payer: 59 | Source: Ambulatory Visit | Attending: Interventional Cardiology | Admitting: Interventional Cardiology

## 2012-09-11 LAB — GLUCOSE, CAPILLARY
Glucose-Capillary: 137 mg/dL — ABNORMAL HIGH (ref 70–99)
Glucose-Capillary: 101 mg/dL — ABNORMAL HIGH (ref 70–99)

## 2012-09-11 NOTE — Progress Notes (Signed)
Pt started cardiac rehab today.  Pt tolerated light exercise without difficulty. Pt arrived to exercise early.  Pt had eaten his breakfast but had not taken his medications.  Pt brought his meds with him but he was unsure if he should take them.  Pt given H20 so that he could take his medications.  Pt monitor showed sr with rare pvc's.  Continue to monitor.

## 2012-09-11 NOTE — Progress Notes (Signed)
Pt reports that he has good support system in place.

## 2012-09-11 NOTE — Progress Notes (Signed)
Quality of life survey reviewed with patient.

## 2012-09-16 ENCOUNTER — Encounter (HOSPITAL_COMMUNITY)
Admission: RE | Admit: 2012-09-16 | Discharge: 2012-09-16 | Disposition: A | Discharge status: Home / Self Care | Payer: 59 | Source: Ambulatory Visit | Attending: Interventional Cardiology | Admitting: Interventional Cardiology

## 2012-09-16 LAB — GLUCOSE, CAPILLARY
Glucose-Capillary: 220 mg/dL — ABNORMAL HIGH (ref 70–99)
Glucose-Capillary: 139 mg/dL — ABNORMAL HIGH (ref 70–99)

## 2012-09-18 ENCOUNTER — Encounter (HOSPITAL_COMMUNITY)
Admission: RE | Admit: 2012-09-18 | Discharge: 2012-09-18 | Disposition: A | Discharge status: Home / Self Care | Payer: 59 | Source: Ambulatory Visit | Attending: Interventional Cardiology | Admitting: Interventional Cardiology

## 2012-09-18 LAB — GLUCOSE, CAPILLARY: Glucose-Capillary: 170 mg/dL — ABNORMAL HIGH (ref 70–99)

## 2012-09-23 ENCOUNTER — Encounter (HOSPITAL_COMMUNITY): Payer: 59

## 2012-09-25 ENCOUNTER — Encounter (HOSPITAL_COMMUNITY)
Admission: RE | Admit: 2012-09-25 | Discharge: 2012-09-25 | Disposition: A | Discharge status: Home / Self Care | Payer: 59 | Source: Ambulatory Visit | Attending: Interventional Cardiology | Admitting: Interventional Cardiology

## 2012-09-25 DIAGNOSIS — Z794 Long term (current) use of insulin: Secondary | ICD-10-CM | POA: Insufficient documentation

## 2012-09-25 DIAGNOSIS — Z9861 Coronary angioplasty status: Secondary | ICD-10-CM | POA: Insufficient documentation

## 2012-09-25 DIAGNOSIS — I2510 Atherosclerotic heart disease of native coronary artery without angina pectoris: Secondary | ICD-10-CM | POA: Insufficient documentation

## 2012-09-25 DIAGNOSIS — E785 Hyperlipidemia, unspecified: Secondary | ICD-10-CM | POA: Insufficient documentation

## 2012-09-25 DIAGNOSIS — E669 Obesity, unspecified: Secondary | ICD-10-CM | POA: Insufficient documentation

## 2012-09-25 DIAGNOSIS — E119 Type 2 diabetes mellitus without complications: Secondary | ICD-10-CM | POA: Insufficient documentation

## 2012-09-25 DIAGNOSIS — I252 Old myocardial infarction: Secondary | ICD-10-CM | POA: Insufficient documentation

## 2012-09-25 DIAGNOSIS — M109 Gout, unspecified: Secondary | ICD-10-CM | POA: Insufficient documentation

## 2012-09-25 DIAGNOSIS — Z5189 Encounter for other specified aftercare: Secondary | ICD-10-CM | POA: Insufficient documentation

## 2012-09-25 DIAGNOSIS — I10 Essential (primary) hypertension: Secondary | ICD-10-CM | POA: Insufficient documentation

## 2012-09-25 LAB — GLUCOSE, CAPILLARY: Glucose-Capillary: 128 mg/dL — ABNORMAL HIGH (ref 70–99)

## 2012-09-30 ENCOUNTER — Encounter (HOSPITAL_COMMUNITY)
Admission: RE | Admit: 2012-09-30 | Discharge: 2012-09-30 | Disposition: A | Discharge status: Home / Self Care | Payer: 59 | Source: Ambulatory Visit | Attending: Interventional Cardiology | Admitting: Interventional Cardiology

## 2012-09-30 LAB — GLUCOSE, CAPILLARY: Glucose-Capillary: 194 mg/dL — ABNORMAL HIGH (ref 70–99)

## 2012-10-02 ENCOUNTER — Encounter (HOSPITAL_COMMUNITY): Payer: 59

## 2012-10-07 ENCOUNTER — Encounter (HOSPITAL_COMMUNITY)
Admission: RE | Admit: 2012-10-07 | Discharge: 2012-10-07 | Disposition: A | Discharge status: Home / Self Care | Payer: 59 | Source: Ambulatory Visit | Attending: Interventional Cardiology | Admitting: Interventional Cardiology

## 2012-10-07 LAB — GLUCOSE, CAPILLARY: Glucose-Capillary: 128 mg/dL — ABNORMAL HIGH (ref 70–99)

## 2012-10-09 ENCOUNTER — Encounter (HOSPITAL_COMMUNITY)
Admission: RE | Admit: 2012-10-09 | Discharge: 2012-10-09 | Disposition: A | Discharge status: Home / Self Care | Payer: 59 | Source: Ambulatory Visit | Attending: Interventional Cardiology | Admitting: Interventional Cardiology

## 2012-10-09 LAB — GLUCOSE, CAPILLARY
Glucose-Capillary: 114 mg/dL — ABNORMAL HIGH (ref 70–99)
Glucose-Capillary: 94 mg/dL (ref 70–99)
Glucose-Capillary: 107 mg/dL — ABNORMAL HIGH (ref 70–99)

## 2012-10-14 ENCOUNTER — Encounter (HOSPITAL_COMMUNITY): Payer: 59

## 2012-10-21 ENCOUNTER — Encounter (HOSPITAL_COMMUNITY)
Admission: RE | Admit: 2012-10-21 | Discharge: 2012-10-21 | Disposition: A | Discharge status: Home / Self Care | Payer: 59 | Source: Ambulatory Visit | Attending: Interventional Cardiology | Admitting: Interventional Cardiology

## 2012-10-21 LAB — GLUCOSE, CAPILLARY: Glucose-Capillary: 153 mg/dL — ABNORMAL HIGH (ref 70–99)

## 2012-10-23 ENCOUNTER — Encounter (HOSPITAL_COMMUNITY): Payer: PRIVATE HEALTH INSURANCE

## 2012-10-28 ENCOUNTER — Encounter (HOSPITAL_COMMUNITY): Payer: PRIVATE HEALTH INSURANCE

## 2012-10-30 ENCOUNTER — Encounter (HOSPITAL_COMMUNITY)
Admission: RE | Admit: 2012-10-30 | Discharge: 2012-10-30 | Disposition: A | Discharge status: Home / Self Care | Payer: PRIVATE HEALTH INSURANCE | Source: Ambulatory Visit | Attending: Interventional Cardiology | Admitting: Interventional Cardiology

## 2012-10-30 DIAGNOSIS — Z794 Long term (current) use of insulin: Secondary | ICD-10-CM | POA: Insufficient documentation

## 2012-10-30 DIAGNOSIS — Z5189 Encounter for other specified aftercare: Secondary | ICD-10-CM | POA: Insufficient documentation

## 2012-10-30 DIAGNOSIS — I2510 Atherosclerotic heart disease of native coronary artery without angina pectoris: Secondary | ICD-10-CM | POA: Insufficient documentation

## 2012-10-30 DIAGNOSIS — E785 Hyperlipidemia, unspecified: Secondary | ICD-10-CM | POA: Insufficient documentation

## 2012-10-30 DIAGNOSIS — Z9861 Coronary angioplasty status: Secondary | ICD-10-CM | POA: Insufficient documentation

## 2012-10-30 DIAGNOSIS — E669 Obesity, unspecified: Secondary | ICD-10-CM | POA: Insufficient documentation

## 2012-10-30 DIAGNOSIS — M109 Gout, unspecified: Secondary | ICD-10-CM | POA: Insufficient documentation

## 2012-10-30 DIAGNOSIS — E119 Type 2 diabetes mellitus without complications: Secondary | ICD-10-CM | POA: Insufficient documentation

## 2012-10-30 DIAGNOSIS — I10 Essential (primary) hypertension: Secondary | ICD-10-CM | POA: Insufficient documentation

## 2012-10-30 DIAGNOSIS — I252 Old myocardial infarction: Secondary | ICD-10-CM | POA: Insufficient documentation

## 2012-10-30 LAB — GLUCOSE, CAPILLARY: Glucose-Capillary: 129 mg/dL — ABNORMAL HIGH (ref 70–99)

## 2012-11-04 ENCOUNTER — Encounter (HOSPITAL_COMMUNITY)
Admission: RE | Admit: 2012-11-04 | Discharge: 2012-11-04 | Disposition: A | Discharge status: Home / Self Care | Payer: PRIVATE HEALTH INSURANCE | Source: Ambulatory Visit | Attending: Interventional Cardiology | Admitting: Interventional Cardiology

## 2012-11-04 LAB — GLUCOSE, CAPILLARY: Glucose-Capillary: 160 mg/dL — ABNORMAL HIGH (ref 70–99)

## 2012-11-06 ENCOUNTER — Encounter (HOSPITAL_COMMUNITY)
Admission: RE | Admit: 2012-11-06 | Discharge: 2012-11-06 | Disposition: A | Discharge status: Home / Self Care | Payer: PRIVATE HEALTH INSURANCE | Source: Ambulatory Visit | Attending: Interventional Cardiology | Admitting: Interventional Cardiology

## 2012-11-06 LAB — GLUCOSE, CAPILLARY: Glucose-Capillary: 175 mg/dL — ABNORMAL HIGH (ref 70–99)

## 2012-11-11 ENCOUNTER — Encounter (HOSPITAL_COMMUNITY): Payer: PRIVATE HEALTH INSURANCE

## 2012-11-13 ENCOUNTER — Encounter (HOSPITAL_COMMUNITY): Payer: PRIVATE HEALTH INSURANCE

## 2012-11-18 ENCOUNTER — Encounter (HOSPITAL_COMMUNITY): Payer: PRIVATE HEALTH INSURANCE

## 2012-11-20 ENCOUNTER — Encounter (HOSPITAL_COMMUNITY): Payer: PRIVATE HEALTH INSURANCE

## 2012-11-25 ENCOUNTER — Encounter (HOSPITAL_COMMUNITY)
Admission: RE | Admit: 2012-11-25 | Discharge: 2012-11-25 | Disposition: A | Discharge status: Home / Self Care | Payer: 59 | Source: Ambulatory Visit | Attending: Interventional Cardiology | Admitting: Interventional Cardiology

## 2012-11-25 DIAGNOSIS — I10 Essential (primary) hypertension: Secondary | ICD-10-CM | POA: Insufficient documentation

## 2012-11-25 DIAGNOSIS — Z9861 Coronary angioplasty status: Secondary | ICD-10-CM | POA: Insufficient documentation

## 2012-11-25 DIAGNOSIS — E119 Type 2 diabetes mellitus without complications: Secondary | ICD-10-CM | POA: Insufficient documentation

## 2012-11-25 DIAGNOSIS — I252 Old myocardial infarction: Secondary | ICD-10-CM | POA: Insufficient documentation

## 2012-11-25 DIAGNOSIS — E669 Obesity, unspecified: Secondary | ICD-10-CM | POA: Insufficient documentation

## 2012-11-25 DIAGNOSIS — I2510 Atherosclerotic heart disease of native coronary artery without angina pectoris: Secondary | ICD-10-CM | POA: Insufficient documentation

## 2012-11-25 DIAGNOSIS — Z5189 Encounter for other specified aftercare: Secondary | ICD-10-CM | POA: Insufficient documentation

## 2012-11-25 DIAGNOSIS — M109 Gout, unspecified: Secondary | ICD-10-CM | POA: Insufficient documentation

## 2012-11-25 DIAGNOSIS — E785 Hyperlipidemia, unspecified: Secondary | ICD-10-CM | POA: Insufficient documentation

## 2012-11-25 DIAGNOSIS — Z794 Long term (current) use of insulin: Secondary | ICD-10-CM | POA: Insufficient documentation

## 2012-11-25 LAB — GLUCOSE, CAPILLARY: Glucose-Capillary: 178 mg/dL — ABNORMAL HIGH (ref 70–99)

## 2012-11-27 ENCOUNTER — Encounter (HOSPITAL_COMMUNITY)
Admission: RE | Admit: 2012-11-27 | Discharge: 2012-11-27 | Disposition: A | Discharge status: Home / Self Care | Payer: 59 | Source: Ambulatory Visit | Attending: Interventional Cardiology | Admitting: Interventional Cardiology

## 2012-11-27 LAB — GLUCOSE, CAPILLARY: Glucose-Capillary: 113 mg/dL — ABNORMAL HIGH (ref 70–99)

## 2012-11-27 NOTE — Progress Notes (Signed)
Gregory [name] 62 y.o. male Nutrition Note Spoke with pt.  Nutrition Plan and Nutrition Survey goals reviewed with pt. Pt is following Step 1 of the Therapeutic Lifestyle Changes diet. Pt wants to lose wt. Pt has not been trying to lose wt at this time. Per pt, pt plans on riding his bike on his non-rehab days "but the weather's been too cold or it's been rainy." Wt loss tips briefly reviewed. Pt wt today reportedly 100.7 kg, which is up 0.6 kg. Per pt, his UBW is 230# (104.5 kg). Pt's wt is down 3.8 kg (8.5#) from his UBW. Pt is diabetic. This Clinical research associate went over Diabetes Education test results. Pt states he has been educated re: DM and has attended Nutrition classes on DM. Pt expressed understanding of the information reviewed. Pt checks fasting CBG's q am. Pt "doesn't remember" what his am CBG's run. Pt aware of nutrition education classes offered and is unable to attend nutrition classes due to work.  Nutrition Diagnosis   Food-and nutrition-related knowledge deficit related to lack of exposure to information as related to diagnosis of: ? CVD ? DM   Obesity related to excessive energy intake as evidenced by a BMI of 33.6  Nutrition RX/ Estimated Daily Nutrition Needs for: wt loss  1500-2000 Kcal, 40-55 gm fat, 9-13 gm sat fat, 1.4-2.0 gm trans-fat, <1500 mg sodium, 175 gm CHO   Nutrition Intervention   Pt's individual nutrition plan including cholesterol goals reviewed with pt.   Benefits of adopting Therapeutic Lifestyle Changes discussed when Medficts reviewed.   Pt to attend the Portion Distortion class   Pt given handouts for: ? Nutrition I class ? Nutrition II class ? Diabetes Blitz class    Continue client-centered nutrition education by RD, as part of interdisciplinary care. Goal(s)   Pt to identify and limit food sources of saturated fat, trans fat, and cholesterol   Pt to identify food quantities necessary to achieve: ? wt loss to a goal wt of 196-208 lb (89.2-94.6 kg) at graduation  from cardiac rehab.  Monitor and Evaluate progress toward nutrition goal with team. Nutrition Risk: Change to Moderate

## 2012-12-02 ENCOUNTER — Ambulatory Visit (INDEPENDENT_AMBULATORY_CARE_PROVIDER_SITE_OTHER): Payer: Self-pay | Admitting: Family Medicine

## 2012-12-02 ENCOUNTER — Encounter (HOSPITAL_COMMUNITY): Payer: 59

## 2012-12-02 ENCOUNTER — Encounter (HOSPITAL_COMMUNITY)
Admission: RE | Admit: 2012-12-02 | Discharge: 2012-12-02 | Disposition: A | Discharge status: Home / Self Care | Payer: 59 | Source: Ambulatory Visit | Attending: Interventional Cardiology | Admitting: Interventional Cardiology

## 2012-12-02 VITALS — BP 130/78 | HR 76 | Ht 68.0 in | Wt 223.0 lb

## 2012-12-02 DIAGNOSIS — E119 Type 2 diabetes mellitus without complications: Secondary | ICD-10-CM

## 2012-12-02 LAB — GLUCOSE, CAPILLARY
Glucose-Capillary: 91 mg/dL (ref 70–99)
Glucose-Capillary: 112 mg/dL — ABNORMAL HIGH (ref 70–99)

## 2012-12-02 NOTE — Progress Notes (Signed)
Patient presents today for 3 month DM follow-up as part of the employer-sponsored Link to Wellness program. Medications, insulin regimen, and glucose readings have been reviewed. I have also discussed with patient lifestyle interventions such as diet and physical activity. Details of this visit can be found in Care Tracker  documenting program through [facility]). Patient has set a series of personal goals and will follow-up in 3 months for further review of DM.   Hgb A1C today was 6.5%.

## 2012-12-04 ENCOUNTER — Encounter (HOSPITAL_COMMUNITY)
Admission: RE | Admit: 2012-12-04 | Discharge: 2012-12-04 | Disposition: A | Discharge status: Home / Self Care | Payer: 59 | Source: Ambulatory Visit | Attending: Interventional Cardiology | Admitting: Interventional Cardiology

## 2012-12-04 LAB — GLUCOSE, CAPILLARY: Glucose-Capillary: 155 mg/dL — ABNORMAL HIGH (ref 70–99)

## 2012-12-09 ENCOUNTER — Encounter (HOSPITAL_COMMUNITY): Payer: 59

## 2012-12-11 ENCOUNTER — Encounter (HOSPITAL_COMMUNITY)
Admission: RE | Admit: 2012-12-11 | Discharge: 2012-12-11 | Disposition: A | Discharge status: Home / Self Care | Payer: 59 | Source: Ambulatory Visit | Attending: Interventional Cardiology | Admitting: Interventional Cardiology

## 2012-12-11 LAB — GLUCOSE, CAPILLARY: Glucose-Capillary: 191 mg/dL — ABNORMAL HIGH (ref 70–99)

## 2012-12-16 ENCOUNTER — Encounter (HOSPITAL_COMMUNITY)
Admission: RE | Admit: 2012-12-16 | Discharge: 2012-12-16 | Disposition: A | Discharge status: Home / Self Care | Payer: 59 | Source: Ambulatory Visit | Attending: Interventional Cardiology | Admitting: Interventional Cardiology

## 2012-12-16 LAB — GLUCOSE, CAPILLARY: Glucose-Capillary: 125 mg/dL — ABNORMAL HIGH (ref 70–99)

## 2012-12-18 ENCOUNTER — Encounter (HOSPITAL_COMMUNITY)
Admission: RE | Admit: 2012-12-18 | Discharge: 2012-12-18 | Disposition: A | Discharge status: Home / Self Care | Payer: 59 | Source: Ambulatory Visit | Attending: Interventional Cardiology | Admitting: Interventional Cardiology

## 2012-12-18 LAB — GLUCOSE, CAPILLARY: Glucose-Capillary: 105 mg/dL — ABNORMAL HIGH (ref 70–99)

## 2012-12-23 ENCOUNTER — Encounter (HOSPITAL_COMMUNITY): Payer: 59

## 2012-12-23 NOTE — Progress Notes (Signed)
Patient ID: Gregory [name], male   DOB: January 31, 1951, 62 y.o.   MRN: [id number] ATTENDING PHYSICIAN NOTE: I have reviewed the chart and agree with the plan as detailed above. Denny Levy MD Pager [phone]

## 2012-12-25 ENCOUNTER — Encounter (HOSPITAL_COMMUNITY)
Admission: RE | Admit: 2012-12-25 | Discharge: 2012-12-25 | Disposition: A | Discharge status: Home / Self Care | Payer: 59 | Source: Ambulatory Visit | Attending: Interventional Cardiology | Admitting: Interventional Cardiology

## 2012-12-25 DIAGNOSIS — I252 Old myocardial infarction: Secondary | ICD-10-CM | POA: Insufficient documentation

## 2012-12-25 DIAGNOSIS — M109 Gout, unspecified: Secondary | ICD-10-CM | POA: Insufficient documentation

## 2012-12-25 DIAGNOSIS — I2510 Atherosclerotic heart disease of native coronary artery without angina pectoris: Secondary | ICD-10-CM | POA: Insufficient documentation

## 2012-12-25 DIAGNOSIS — E669 Obesity, unspecified: Secondary | ICD-10-CM | POA: Insufficient documentation

## 2012-12-25 DIAGNOSIS — Z794 Long term (current) use of insulin: Secondary | ICD-10-CM | POA: Insufficient documentation

## 2012-12-25 DIAGNOSIS — E119 Type 2 diabetes mellitus without complications: Secondary | ICD-10-CM | POA: Insufficient documentation

## 2012-12-25 DIAGNOSIS — E785 Hyperlipidemia, unspecified: Secondary | ICD-10-CM | POA: Insufficient documentation

## 2012-12-25 DIAGNOSIS — Z9861 Coronary angioplasty status: Secondary | ICD-10-CM | POA: Insufficient documentation

## 2012-12-25 DIAGNOSIS — Z5189 Encounter for other specified aftercare: Secondary | ICD-10-CM | POA: Insufficient documentation

## 2012-12-25 DIAGNOSIS — I10 Essential (primary) hypertension: Secondary | ICD-10-CM | POA: Insufficient documentation

## 2012-12-25 LAB — GLUCOSE, CAPILLARY: Glucose-Capillary: 107 mg/dL — ABNORMAL HIGH (ref 70–99)

## 2012-12-30 ENCOUNTER — Encounter (HOSPITAL_COMMUNITY)
Admission: RE | Admit: 2012-12-30 | Discharge: 2012-12-30 | Disposition: A | Discharge status: Home / Self Care | Payer: 59 | Source: Ambulatory Visit | Attending: Interventional Cardiology | Admitting: Interventional Cardiology

## 2012-12-30 LAB — GLUCOSE, CAPILLARY: Glucose-Capillary: 249 mg/dL — ABNORMAL HIGH (ref 70–99)

## 2013-01-01 ENCOUNTER — Encounter (HOSPITAL_COMMUNITY)
Admission: RE | Admit: 2013-01-01 | Discharge: 2013-01-01 | Disposition: A | Discharge status: Home / Self Care | Payer: 59 | Source: Ambulatory Visit | Attending: Interventional Cardiology | Admitting: Interventional Cardiology

## 2013-01-01 LAB — GLUCOSE, CAPILLARY: Glucose-Capillary: 182 mg/dL — ABNORMAL HIGH (ref 70–99)

## 2013-01-06 ENCOUNTER — Encounter (HOSPITAL_COMMUNITY): Payer: 59

## 2013-01-08 ENCOUNTER — Encounter (HOSPITAL_COMMUNITY): Payer: 59

## 2013-01-13 ENCOUNTER — Encounter (HOSPITAL_COMMUNITY): Payer: 59

## 2013-01-15 ENCOUNTER — Encounter (HOSPITAL_COMMUNITY): Payer: 59

## 2013-02-24 ENCOUNTER — Ambulatory Visit (INDEPENDENT_AMBULATORY_CARE_PROVIDER_SITE_OTHER): Payer: 59 | Admitting: Family Medicine

## 2013-02-24 VITALS — BP 124/71 | HR 75 | Ht 68.0 in | Wt 226.0 lb

## 2013-02-24 DIAGNOSIS — E119 Type 2 diabetes mellitus without complications: Secondary | ICD-10-CM

## 2013-02-24 NOTE — Progress Notes (Signed)
Established Patient - Follow-Up Evaluation  MedLink Consult - Clinical Pharmacist  Care Team Member: Gerre Pebbles      Subjective:  Patient presents today for 3 month diabetes follow-up as part of the employer-sponsored Link to Wellness program. Current diabetes regimen includes Lantus 25 units SQ once daily, metformin 1000 mg twice daily & Januvia 50 mg daily. Patient also continues on daily ASA & ARB. He is not on a statin, but participates in a study looking at evacetrapib. Most recent MD follow-up was in February. No med changes or major health changes at this time.   Patient has a pending appointment with Dr. Nehemiah Settle this month. No medication changes.   He has finished cardiac rehab.   He comes with questions about his blood glucose monitor. He thinks that it is broken because he can't get it to work. He hasn't checked his blood glucose in the past month.    Diabetes:  Type of Diabetes: Type 2; Sees Diabetes provider 4 or more times per year; Diabetes Education Comprehensive group @ [facility]; MD managing Diabetes Renford Dills; Year of diagnosis 2010; hypoglycemia frequency never; takes medications as prescribed; uses glucometer; checks feet daily;  checks blood glucose less than 1 time a week; takes an aspirin a day;   Other Diabetes History: Patiet has not been checking his blood sugar at all. On further questioning he was unfamiliar with his blood glucose goals and targets.     Social History:  Exercise habits: none need knee replacement, limits physical activity; Diet adherence 25-50% of the time; Patient does not know the purpose/use of medications; Denies alcohol use; Patient can afford medications; Medication adherence adherent;  Exercise adherence 1-2 days a week; 60 minutes of exercise per week.   Physical Activity- He cut his grass yesterday. He is cutting it every 2 weeks and it is taking him 2-2.5 hours. He works with Public affairs consultant at [employer]. He states that he does a lot  of walking at work. He works 2nd shift part time, 4 hours a night 5 days a week. He states that now that the weather is nicer he wants to get his bike out.   Nutrition- He has started to eat lunch more often than he was before. He is going to [facility] and getting salmon, slaw and green beans.   From a food recall it appears that he still is not eating lunch very often. He states that he gets busy and then forgets to eat.   typical day-  B- chicken biscuit from [employer] and diet coke.   L- when he does eat lunch it is sometimes a salad. He occasionally is going to [place] and getting a salad.   D- last night he went to [employer] and got the [place], dressing, chocolate cake, corn, broccoli.   snacks- when he does snack he is eating potato chips, diet coke. He states that he is not eating chips that often though. He sometimes is eating PB crackers.   Assessment/Plan: Patient is a 62 year old male with DM2. His last A1C was 6.5% in February. He states that he has an appointment to see Dr. Nehemiah Settle later this month. I will defer A1C testing to patient's PCP later this month. Because patient has not been checking his blood sugar I am unable to assess his glycemic control at this time.   Patient thought that his meter and lancing device were broken and this is why he was not checking his blood sugar. I  got his meter to work today and replaced his lancing device. He checked his blood sugar in the office here and it was 123. He had breakfast about 3 hours ago. I reminded patient of his glycemic goals and targets. Patient voiced understanding of goals.   One of his goals from last visit was to start eating lunch. It appears that he is doing somewhat better with eating lunch, but often is skipping the meal entirely. It does seems that when he is eating lunch he is making healthier choices and isn't always eating a bag of potato chips.   He is not getting much physical activity. His work schedule makes it  difficult to exercise. 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 He does however walk during his shift at [employer]. Patient has a bike and states that with the weather warming he would like to start riding it more. Encouraged patient to complete 150 minutes of physical activity daily.   At last visit we spent a good portion of the time discussing carbohydrates. During a 24 hour recall patient was able to identify all of the carbohydrates in his diet. This is an improvement from last appointment.   After I fixed patient's meter, he agreed to start checking his blood sugar again. I encouraged him to check once daily, first thing in the morning.   Follow up with patient in 3 months..    Goals for Next Visit-  1. Start riding your bike. Aim for at least 150 minutes a week. Times to go- on the weekend.  2. Start checking your blood sugar once daily in the morning before you have anything to eat. Goals for fasting blood sugar- 80-120 (or around 100). If you check it after you eat, we want it to be less than 180.  Next appointment with me is Monday August 11st at 10 AM.

## 2013-03-06 NOTE — Progress Notes (Signed)
Patient ID: Gregory [name], male   DOB: 1951/03/29, 62 y.o.   MRN: [id number] ATTENDING PHYSICIAN NOTE: I have reviewed the chart and agree with the plan as detailed above. Denny Levy MD Pager [phone]

## 2013-06-02 ENCOUNTER — Ambulatory Visit (INDEPENDENT_AMBULATORY_CARE_PROVIDER_SITE_OTHER): Payer: 59 | Admitting: Family Medicine

## 2013-06-02 VITALS — BP 140/68 | HR 72 | Ht 68.0 in | Wt 221.6 lb

## 2013-06-02 DIAGNOSIS — E119 Type 2 diabetes mellitus without complications: Secondary | ICD-10-CM

## 2013-06-02 NOTE — Progress Notes (Signed)
Subjective:  Patient presents today for 3 month diabetes follow-up as part of the employer-sponsored Link to Wellness program. Current diabetes regimen includes Lantus 25 units once daily, Januvia 50 mg ticw daily and metformin 1000 mg twice daily. Patient also continues on daily ASA and ARB. Most recent MD follow-up was in May with Dr. Nehemiah Settle. No med changes or major health changes at this time.   Patient states that he has an appointment pending later this month with Dr. Nehemiah Settle.   Patient has lost 5 pounds since his last appointment with me. He states that he is not eating as much as he used to.     Disease Assessments:  Diabetes:  Type of Diabetes: Type 2; Sees Diabetes provider 4 or more times per year; Diabetes Education Comprehensive group @ [facility]; MD managing Diabetes Renford Dills; Year of diagnosis 2010; hypoglycemia frequency never; takes medications as prescribed; uses glucometer; checks feet daily; checks blood glucose less than 1 time a week; takes an aspirin a day;   Other Diabetes History: Patient did not bring meter with him to the appointment today. He admits that he isn't checking his blood sugar at all. He denies hypoglycemia. I quizzed him on what his blood sugar targets were and he was able to tell me that a reading of 200 was high and 40 was low.    Physical Activity-  He states that he gets a lot of physical activity at work. 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 He is also doing yard work once a week. 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   Nutrition-   He has lost 5 pounds since his last appointment. He states he has cut out the biscuits and is only going to [employer] once a week. He is eating more cereal- raisin bran especially, for breakfast. 2% milk. He states that he is also cutting back on portion sizes. He has cut back on chips too and is only eating chips occasionally.      Preventive Care:     Hemoglobin A1c: 06/02/2013 6.3    Colonoscopy: 12/02/2008  Dilated Eye Exam: 10/22/2012  Flu vaccine: 10/02/2011  Foot Exam: 06/10/2012       Vital Signs:  06/02/2013 10:52 AM (EST) BMI 33.7; Height 5 ft 8 in; Weight 221.6 lbs    Testing:  Blood Sugar Tests:  Hemoglobin A1c: 6.3     Assessment/Plan: Patient is a 62 year old male with DM2. A1C today was 6.3% which is meeting glycemic goal of less than 6.5%. This drop may be because patient has cut back on portion sizes and has lost 5 pounds since his last appointment with me. I congratulated patient on his success and encouraged him to continue making healthy eating choices.   Patient is not doing any additional physical activity outside of work. He expressed interest in bike riding, but he does not know if his bike is in serviceable condition. I encouraged him to get his bike out and get it back into riding condition. He does do a considerable amount of physical activity with his job at [employer], but he expressed concerns that when he retires in a few years, he is worried about weight gain. I encouraged him to make physical activity a part of his daily routine so that he can either maintain or even perhaps lose weight.   Patient has not been checking his blood sugar. I asked him  what he got out of checking his blood sugar. He stated that he would know what his blood sugar was, but on further questioning he was not able to tell me how this knowledge would change his behaviour. I explained to him that checking his blood sugar was like looking at a gauge- it would help him to know how he was managing his disease and would put him in control. Patient agreed that he wanted to be in good health, and I explained that checking CBG would help him be in a position of control to be able to keep himself in good health. Patient agreed to start checking CBG once daily.   I will fax A1C results to Dr. Idelle Crouch office. Follow up in 3 months..    Goals for  Next Visit-  1. Keep your meter in the pantry next to your cereal so that you can remember to check your blood sugar before you have anything to eat.  2. Aim to check your blood sugar every day. Reminder- our goal fasting blood sugar is 80-120.  3. Get your bike into riding condition. Get it out and fix anything that needs fixing.  4. Keep up the great work with cutting back on portion sizes!  Next appointment with me is Monday November 10th at 11 AM.

## 2013-07-10 NOTE — Progress Notes (Signed)
Patient ID: Gregory [name], male   DOB: December 01, 1950, 62 y.o.   MRN: [id number] ATTENDING PHYSICIAN NOTE: I have reviewed the chart and agree with the plan as detailed above. Denny Levy MD Pager [phone]

## 2013-09-01 ENCOUNTER — Ambulatory Visit (INDEPENDENT_AMBULATORY_CARE_PROVIDER_SITE_OTHER): Payer: 59 | Admitting: Family Medicine

## 2013-09-01 ENCOUNTER — Ambulatory Visit: Payer: Self-pay | Admitting: Interventional Cardiology

## 2013-09-01 VITALS — BP 129/75 | HR 67 | Ht 68.0 in | Wt 221.6 lb

## 2013-09-01 DIAGNOSIS — E119 Type 2 diabetes mellitus without complications: Secondary | ICD-10-CM

## 2013-09-01 NOTE — Progress Notes (Signed)
Subjective:   Patient presents today for 3 month diabetes follow-up as part of the employer-sponsored Link to Wellness program. Current diabetes regimen includes Lantus 25 units once daily, Januvia 50 mg BID, metformin 1000 mg BID. Patient also continues on daily ARB and statin.   Patient recently had a visit with Dr. Nehemiah Settle last week for his annual physical. Patient states that he had bloodwork drawn but does not remember what his A1C was. His PSA was elevated and he has been referred to a urologist.   Patient states that things have been going OK since his last appointment. He states that he has been having some indegestion lately. He describes it as pain- sometimes he feels it in is his back, sometimes it is in the front. He states that it comes and goes. He hasn't taken anything to help it- he just waits it out. He describes it as feeling like something is sitting on his chest. He hasn't taken any nitroglycerin. He states that this pain feels like the pain he had prior to his heart attack. He is not in any pain right now, but the pain has been on and off since his last appointment with me.   Patient brought his medications today for review. Patient got the naproxen and ASA bottles confused and has been taking naproxen 220 mg daily instead of ASA for the past several weeks.    Disease Assessments:  Diabetes:  Type of Diabetes: Type 2; Sees Diabetes provider 4 or more times per year; Diabetes Education Comprehensive group @ [facility]; MD managing Diabetes Gregory [name]; Year of diagnosis 2010; hypoglycemia frequency never; takes medications as prescribed; uses glucometer; checks feet daily; checks blood glucose less than 1 time a week; takes an aspirin a day;   Highest CBG 95; Lowest CBG 81; Other Diabetes History:  Patient did not bring meter with him to this appointment. This morning he states that his blood sugar was 95. He is trying to check it every day. Other days- 81,85. He states that on average  his blood sugar in the 80s. Highest- 95, lowest 81.   patient denies hypoglycemia.     Physical Activity-  He states that he gets a lot of physical activity at work. 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   Outside of work he is walking twice a week for 20 minutes (he is walking at [employer]).   Nutrition-   States that things have been going OK. He states that he isn't going out for biscuits as many times during the week now- probably twice a week now. He states that he has decreased his portion sizes too.    Vital Signs:  09/01/2013 11:52 AM (EST) Blood Pressure 129 / 75 mm/HgBMI 32.8; Height 5 ft 8 in; Pulse Rate 67 bpm; Weight 216 lbs       Assessment/Plan: Patient has been complaining of on and off chest pain for the past several days. He states that he has thought about going to the ED but the pain usually subsides and so he hasn't gone yet. He states that he can't tell if the pain is due to indegestion or chest pain. While patient was here in the office I called his cardiologist's office and got him an appointment to see his cardiologist next week. I advised him that if the chest pain worsens that he should call 911 or go to the ED. I asked patient to find his  nitroglycerin tablets at home and explained how to use them. Patient had 2 stents placed in August 2013.   Patient has been taking naproxen daily instead of ASA. He states that he got the bottles confused. Patient states that he will find the bottle of ASA and switch it out and start taking ASA once daily.   Patient recently had a visit with Dr. Nehemiah Settle. I will fax to his office to get patient's most recent A1C result. Patient has been checking CBGs every morning fasting, and based on readings around 90-100, I expect that his A1C is still meeting goal of less than 7%. Patient continues to make healthier eating choices and he is exercising.   Patient will return to see me next  week so that I can help him get enrolled in the new wellness program through his employer. Follow up for diabetes in 3 months.  .   Goals for Next Visit-  1.Appointment with Dr. Eldridge Dace is next Monday at 3:45 PM. Dr. Hoyle Barr new phone number- [phone]; [address].  2. When you get home, find your bottle of aspirin and switch it out with the naproxen. 3. Come back next week at 9:30 AM and I will help you with the wellness profile.  4. Keep exercising- aim for 3 times a week.  5. Keep making healthy eating choices.  6. Find your bottle of nitroglycerin. When you are having chest pain, place 1 nitroglycerin tablet underneath your tongue. You can repeat this every 5 minutes up to a total of 3 tablets for chest pain.  7. If your chest pain worsens before your appointment with Dr. Eldridge Dace, call 911 or go to the emergency room.   Next appointment with me- next monday at 9:30 AM. 3 month follow up appointment is February 9th at 10:30 AM.   Gregory [name]. Vivia Ewing, PharmD, BCPS

## 2013-09-02 ENCOUNTER — Other Ambulatory Visit: Payer: Self-pay | Admitting: Interventional Cardiology

## 2013-09-05 ENCOUNTER — Other Ambulatory Visit: Payer: Self-pay | Admitting: Interventional Cardiology

## 2013-09-08 ENCOUNTER — Encounter: Payer: Self-pay | Admitting: Nurse Practitioner

## 2013-09-08 ENCOUNTER — Ambulatory Visit (INDEPENDENT_AMBULATORY_CARE_PROVIDER_SITE_OTHER): Payer: 59 | Admitting: Nurse Practitioner

## 2013-09-08 VITALS — BP 128/76 | HR 67 | Ht 68.0 in | Wt 229.4 lb

## 2013-09-08 DIAGNOSIS — R079 Chest pain, unspecified: Secondary | ICD-10-CM

## 2013-09-08 MED ORDER — CLOPIDOGREL BISULFATE 75 MG PO TABS: 75.0000 mg | ORAL_TABLET | Freq: Every day | ORAL | Status: DC

## 2013-09-08 NOTE — Patient Instructions (Signed)
We are going to arrange for a stress Myoview test  Stay on your current medicine but after you finish your current supply of Brilinta - start Plavix 75 mg a day - the prescription for the plavix is at the drug store  See Dr. Abe People for discussion after stress test  Call the [facility] office at [phone] if you have any questions, problems or concerns.

## 2013-09-08 NOTE — Progress Notes (Signed)
Gregory [name] Date of Birth: April 04, 1951 Medical Record #[id number]  History of Present Illness: Gregory [name] is seen back today as a work in visit. Seen for Dr. Eldridge Dace. He has known CAD with past NSTEMI with overlapping DES stents to the LAD in August of 2013. Does have distal LAD disease medically managed. His chest pain syndrome was that of indigestion and he had an exertional component.  Other issues include DM, HTN, gout, obesity, knee pain, DJD, chronic lower extremity edema and HLD.  Last seen by Dr. Eldridge Dace back in September. Seemed to be doing ok.  Comes back today. Noted that 2 weeks ago, had had recurrent indigestion - this was not exercise induced. He used TUMS some with relief. He used NTG x 2 one day with relief. No associated symptoms. This did feel somewhat like his prior chest pain syndrome. He remains active but not really exercising. Now feels fine.   Current Outpatient Prescriptions  Medication Sig Dispense Refill  . allopurinol (ZYLOPRIM) 100 MG tablet Take 300 mg by mouth daily.       Marland Kitchen amLODipine-valsartan (EXFORGE) 10-320 MG per tablet Take 1 tablet by mouth daily.      Marland Kitchen BRILINTA 90 MG TABS tablet TAKE 1 TABLET BY MOUTH 2 TIMES DAILY.  60 tablet  0  . colchicine 0.6 MG tablet Take 0.6 mg by mouth 2 (two) times daily as needed.      . indomethacin (INDOCIN) 50 MG capsule Take 50 mg by mouth 2 (two) times daily as needed.      . insulin glargine (LANTUS) 100 UNIT/ML injection Inject 25 Units into the skin every morning.       . INVESTIGATIONAL DRUG SIMPLE RECORD daily. Study= evacetrapib vs placebo; study is double blind so patient does not know if he has active drug or not; run through [facility]      . metFORMIN (GLUCOPHAGE) 1000 MG tablet Take 1,000 mg by mouth 2 (two) times daily with a meal.      . metoprolol tartrate (LOPRESSOR) 12.5 mg TABS Take 12.5 mg by mouth 2 (two) times daily.      Marland Kitchen NITROSTAT 0.4 MG SL tablet PLACE 1 TABLET UNDER THE TONGUE EVERY 5  MINUTES (MAX OF 3 DOSES) AS NEEDED FOR CHEST PAIN.  25 tablet  6  . sildenafil (VIAGRA) 50 MG tablet Take 50 mg by mouth daily as needed for erectile dysfunction.      . sitaGLIPtin (JANUVIA) 50 MG tablet Take 50 mg by mouth 2 (two) times daily.       No current facility-administered medications for this visit.    No Known Allergies  Past Medical History  Diagnosis Date  . Hypertension   . Hyperlipidemia   . Coronary artery disease   . Swelling of left lower extremity     LLE; "happens often"  . Myocardial infarction 05/2012    "mild"  . Type II diabetes mellitus   . Gout   . Arthritis     Past Surgical History  Procedure Laterality Date  . Coronary angioplasty with stent placement  06/05/2012    "2; total of 2"  . Back surgery    . Lumbar disc surgery  1990's    History  Smoking status  . Former Smoker -- 2.00 packs/day for 30 years  . Types: Cigarettes  . Quit date: 10/23/1998  Smokeless tobacco  . Never Used    History  Alcohol Use No    No family history on  file.  Review of Systems: The review of systems is per the HPI.  All other systems were reviewed and are negative.  Physical Exam: BP 128/76  Pulse 67  Ht 5\' 8"  (1.727 m)  Wt 229 lb 6.4 oz (104.055 kg)  BMI 34.89 kg/m2 Patient is very pleasant and in no acute distress. Skin is warm and dry. Color is normal.  HEENT is unremarkable. Normocephalic/atraumatic. PERRL. Sclera are nonicteric. Neck is supple. No masses. No JVD. Lungs are clear. Cardiac exam shows a regular rate and rhythm. Abdomen is soft. Extremities are without edema. Gait and ROM are intact. No gross neurologic deficits noted.  LABORATORY DATA: EKG with sinus rhythm. Does have septal Q's. Lateral T wave changes. Tracing is reviewed with Dr. Eldridge Dace.  Lab Results  Component Value Date   WBC 9.0 06/06/2012   HGB 15.8 06/06/2012   HCT 46.0 06/06/2012   PLT 186 06/06/2012   GLUCOSE 143* 06/06/2012   ALT 16 06/04/2012   AST 21 06/04/2012   NA  139 06/06/2012   K 4.6 06/06/2012   CL 102 06/06/2012   CREATININE 1.27 06/06/2012   BUN 19 06/06/2012   CO2 28 06/06/2012   TSH 2.533 06/04/2012   INR 1.01 06/05/2012   Echo Study Conclusions from August 2013  - Left ventricle: The cavity size was normal. Wall thickness was increased in a pattern of mild LVH. Systolic function was normal. The estimated ejection fraction was in the range of 55% to 60%. Wall motion was normal; there were no regional wall motion abnormalities. Features are consistent with a pseudonormal left ventricular filling pattern, with concomitant abnormal relaxation and increased filling pressure (grade 2 diastolic dysfunction). Doppler parameters are consistent with elevated mean left atrial filling pressure. - Atrial septum: No defect or patent foramen ovale was identified.  CARDIAC CATH IMPRESSIONS FROM AUGUST 2013:  1. Widely patent left main coronary artery. 2. 90% stenosis in the mid left anterior descending artery which is likely the culprit. There was also moderate disease past this focal area. This was all treated with 2 overlapping drug-eluting stents, 3.0 x 38 mm and 3.5 x 12 mm the entire stented area was postdilated with a 3.75 balloon. There is apical LAD disease which will be managed medically. Patent diagonal branches. 3. Widely patent, large left circumflex artery and its branches. 4. Widely patent right coronary artery. 5. Normal left ventricular systolic function. LVEDP 15 mmHg. Ejection fraction 55 %. RECOMMENDATION: The patient will require dual antiplatelet therapy for at least a year. He'll need aggressive blood pressure control and lipid-lowering therapy. LDL target is 70. Will adjust lipid-lowering therapy to achieve this goal. If he does have persistent angina from the apical LAD disease, would start nitrates. However, I think he will feel better now that this larger portion of the vessel is opened.       Assessment / Plan: 1. Chest pain with  known CAD - past NSTEMI from August of 2013 with overlapping LAD DES stents - distal disease. I have discussed his case with Dr. Eldridge Dace who is here this morning in the office - will arrange for stress Myoview. Since he is over a year out from his PCI - will change Brilinta to Plavix.   2. DM  3. HTN - BP looks ok  4. Obesity  See him back after his Myoview for discussion. NTG prn and call EMS if he has used 3 in 15 minutes with no relief. No medicine changes today.  Patient is  agreeable to this plan and will call if any problems develop in the interim.   Rosalio Macadamia, RN, ANP-C [facility] [address] [place], [place]  [address]

## 2013-09-09 ENCOUNTER — Encounter: Payer: Self-pay | Admitting: Interventional Cardiology

## 2013-09-29 ENCOUNTER — Ambulatory Visit (HOSPITAL_COMMUNITY): Payer: 59 | Attending: Cardiology | Admitting: Radiology

## 2013-09-29 ENCOUNTER — Encounter: Payer: Self-pay | Admitting: Cardiology

## 2013-09-29 ENCOUNTER — Encounter: Payer: Self-pay | Admitting: Cardiovascular Disease

## 2013-09-29 VITALS — BP 125/72 | Ht 68.0 in | Wt 228.0 lb

## 2013-09-29 DIAGNOSIS — Z9861 Coronary angioplasty status: Secondary | ICD-10-CM | POA: Insufficient documentation

## 2013-09-29 DIAGNOSIS — I252 Old myocardial infarction: Secondary | ICD-10-CM | POA: Insufficient documentation

## 2013-09-29 DIAGNOSIS — Z87891 Personal history of nicotine dependence: Secondary | ICD-10-CM | POA: Insufficient documentation

## 2013-09-29 DIAGNOSIS — I10 Essential (primary) hypertension: Secondary | ICD-10-CM | POA: Insufficient documentation

## 2013-09-29 DIAGNOSIS — I2510 Atherosclerotic heart disease of native coronary artery without angina pectoris: Secondary | ICD-10-CM

## 2013-09-29 DIAGNOSIS — E119 Type 2 diabetes mellitus without complications: Secondary | ICD-10-CM | POA: Insufficient documentation

## 2013-09-29 DIAGNOSIS — Z794 Long term (current) use of insulin: Secondary | ICD-10-CM | POA: Insufficient documentation

## 2013-09-29 DIAGNOSIS — R9431 Abnormal electrocardiogram [ECG] [EKG]: Secondary | ICD-10-CM

## 2013-09-29 DIAGNOSIS — R079 Chest pain, unspecified: Secondary | ICD-10-CM

## 2013-09-29 DIAGNOSIS — E785 Hyperlipidemia, unspecified: Secondary | ICD-10-CM | POA: Insufficient documentation

## 2013-09-29 MED ORDER — TECHNETIUM TC 99M SESTAMIBI GENERIC - CARDIOLITE
30.0000 | Freq: Once | INTRAVENOUS | Status: AC | PRN
  Administered 2013-09-29: 30 via INTRAVENOUS

## 2013-09-29 MED ORDER — TECHNETIUM TC 99M SESTAMIBI GENERIC - CARDIOLITE
10.0000 | Freq: Once | INTRAVENOUS | Status: AC | PRN
  Administered 2013-09-29: 10 via INTRAVENOUS

## 2013-09-29 NOTE — Progress Notes (Signed)
  [facility] SITE 3 NUCLEAR MED [address] [place], [place] [address] [phone]    Cardiology Nuclear Med Study  Gregory [name] is a 62 y.o. male     MRN : [id number]     DOB: June 08, 1951  Procedure Date: 09/29/2013  Nuclear Med Background Indication for Stress Test:  Evaluation for Ischemia History:  Cath-MI-Stent/PTCA-LAD '13 ECHO EF: 55-60% Cardiac Risk Factors: History of Smoking, Hypertension, IDDM Type 1 and Lipids  Symptoms:  Chest Pain   Nuclear Pre-Procedure Caffeine/Decaff Intake:  None NPO After: 11:00pm   Lungs:  clear O2 Sat: 97% on room air. IV 0.9% NS with Angio Cath:  20g  IV Site: R Forearm  IV Started by:  Cathlyn Parsons, RN  Chest Size (in):  40 Cup Size: n/a  Height: 5\' 8"  (1.727 m)  Weight:  228 lb (103.42 kg)  BMI:  Body mass index is 34.68 kg/(m^2). Tech Comments:  No Lopressor x 12 hrs. Pt took Lantus insulin only this am and CBG 50 at home. Rechecked CBG 113 at 0940.    Nuclear Med Study 1 or 2 day study: 1 day  Stress Test Type:  Stress  Reading MD: Marca Ancona, MD  Order Authorizing Provider:  Floydene Flock  Resting Radionuclide: Technetium 58m Sestamibi  Resting Radionuclide Dose: 10.8 mCi   Stress Radionuclide:  Technetium 7m Sestamibi  Stress Radionuclide Dose: 33.0 mCi           Stress Protocol Rest HR: 66 Stress HR: 139  Rest BP: 125/72 Stress BP: 187/80  Exercise Time (min): 5:30 METS: 7.0   Predicted Max HR: 157 bpm % Max HR: 88.54 bpm Rate Pressure Product: 57846   Dose of Adenosine (mg):  n/a Dose of Lexiscan: n/a mg  Dose of Atropine (mg): n/a Dose of Dobutamine: n/a mcg/kg/min (at max HR)  Stress Test Technologist: Milana Na, EMT-P  Nuclear Technologist:  Harlow Asa, CNMT     Rest Procedure:  Myocardial perfusion imaging was performed at rest 45 minutes following the intravenous administration of Technetium 35m Sestamibi. Rest ECG: NSR, poor R wave progression  Stress Procedure:  The  patient exercised on the treadmill utilizing the Bruce Protocol for 5:30 minutes. The patient stopped due to fatigue and denied any chest pain.  Technetium 56m Sestamibi was injected at peak exercise and myocardial perfusion imaging was performed after a brief delay. Stress ECG: No significant change from baseline ECG  QPS Raw Data Images:  Mild diaphragmatic attenuation.  Normal left ventricular size. Stress Images:  Normal homogeneous uptake in all areas of the myocardium. Rest Images:  There is decreased uptake in the inferior wall. Subtraction (SDS):  No evidence of ischemia. Transient Ischemic Dilatation (Normal <1.22):  1.04 Lung/Heart Ratio (Normal <0.45):  0.40  Quantitative Gated Spect Images QGS EDV:  139 ml QGS ESV:  77 ml  Impression Exercise Capacity:  Fair exercise capacity. BP Response:  Hypertensive blood pressure response. Clinical Symptoms:  There is dyspnea. ECG Impression:  No significant ST segment change suggestive of ischemia. Comparison with Prior Nuclear Study: No previous nuclear study performed  Overall Impression:  Low risk stress nuclear study with no clear evidence of ischemia.  If sx persist, could consider angiography..  LV Ejection Fraction: 44%.  LV Wall Motion:  NL LV Function; NL Wall Motion  Corky Crafts., MD, [facility]

## 2013-10-06 ENCOUNTER — Other Ambulatory Visit: Payer: Self-pay

## 2013-10-06 ENCOUNTER — Encounter: Payer: Self-pay | Admitting: Interventional Cardiology

## 2013-10-06 ENCOUNTER — Other Ambulatory Visit: Payer: Self-pay | Admitting: Interventional Cardiology

## 2013-10-06 ENCOUNTER — Ambulatory Visit (INDEPENDENT_AMBULATORY_CARE_PROVIDER_SITE_OTHER): Payer: 59 | Admitting: Interventional Cardiology

## 2013-10-06 VITALS — BP 132/78 | HR 92 | Ht 68.0 in | Wt 227.8 lb

## 2013-10-06 DIAGNOSIS — I10 Essential (primary) hypertension: Secondary | ICD-10-CM

## 2013-10-06 DIAGNOSIS — I2510 Atherosclerotic heart disease of native coronary artery without angina pectoris: Secondary | ICD-10-CM

## 2013-10-06 DIAGNOSIS — E785 Hyperlipidemia, unspecified: Secondary | ICD-10-CM

## 2013-10-06 DIAGNOSIS — E669 Obesity, unspecified: Secondary | ICD-10-CM | POA: Insufficient documentation

## 2013-10-06 MED ORDER — METOPROLOL TARTRATE 12.5 MG HALF TABLET: 12.5000 mg | ORAL_TABLET | Freq: Two times a day (BID) | ORAL | Status: DC

## 2013-10-06 NOTE — Progress Notes (Signed)
Patient ID: Gregory [name], male   DOB: 1951/02/10, 62 y.o.   MRN: [id number]    [address] [place], [place]  [address] Phone: [phone] Fax:  [phone]  Date:  10/06/2013   ID:  Gregory [name], DOB 02/23/1951, MRN [id number]  PCP:  [name], MD      History of Present Illness: Gregory [name] is a 62 y.o. male who had overlapping stents to the LAD, DES in 8/13. Not exercising when he does not go to rehab. CAD/ASCVD:  Denies : Chest pain.  Diaphoresis.  Dizziness.  Dyspnea on exertion.  Fatigue.  Leg edema.  Nitroglycerin.  Orthopnea.  Palpitations.  Shortness of breath.  Syncope.  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 No problems with wrist. No SHOB.    Wt Readings from Last 3 Encounters:  10/06/13 227 lb 12.8 oz (103.329 kg)  09/29/13 228 lb (103.42 kg)  09/08/13 229 lb 6.4 oz (104.055 kg)     Past Medical History  Diagnosis Date  . Hypertension   . Hyperlipidemia   . Coronary artery disease   . Swelling of left lower extremity     LLE; "happens often"  . Myocardial infarction 05/2012    "mild"  . Type II diabetes mellitus   . Gout   . Arthritis     Current Outpatient Prescriptions  Medication Sig Dispense Refill  . allopurinol (ZYLOPRIM) 100 MG tablet Take 300 mg by mouth daily.       Marland Kitchen amLODipine-valsartan (EXFORGE) 10-320 MG per tablet Take 1 tablet by mouth daily.      . clopidogrel (PLAVIX) 75 MG tablet Take 1 tablet (75 mg total) by mouth daily.  90 tablet  3  . indomethacin (INDOCIN) 50 MG capsule Take 50 mg by mouth 2 (two) times daily as needed.      . insulin glargine (LANTUS) 100 UNIT/ML injection Inject 25 Units into the skin every morning.       . INVESTIGATIONAL DRUG SIMPLE RECORD daily. Study= evacetrapib vs placebo; study is double blind so patient does not know if he has active drug or not; run through [facility]      . metFORMIN (GLUCOPHAGE) 1000 MG tablet Take 1,000 mg by mouth 2 (two) times daily  with a meal.      . metoprolol tartrate (LOPRESSOR) 12.5 mg TABS Take 12.5 mg by mouth 2 (two) times daily.      Marland Kitchen NITROSTAT 0.4 MG SL tablet PLACE 1 TABLET UNDER THE TONGUE EVERY 5 MINUTES (MAX OF 3 DOSES) AS NEEDED FOR CHEST PAIN.  25 tablet  6  . sitaGLIPtin (JANUVIA) 50 MG tablet Take 50 mg by mouth 2 (two) times daily.       No current facility-administered medications for this visit.    Allergies:   No Known Allergies  Social History:  The patient  reports that he quit smoking about 14 years ago. His smoking use included Cigarettes. He has a 60 pack-year smoking history. He has never used smokeless tobacco. He reports that he does not drink alcohol or use illicit drugs.   Family History:  The patient's family history is not on file.   ROS:  Please see the history of present illness.  No nausea, vomiting.  No fevers, chills.  No focal weakness.  No dysuria.   All other systems reviewed and negative.   PHYSICAL EXAM: VS:  BP 132/78  Pulse 92  Ht 5\' 8"  (1.727  m)  Wt 227 lb 12.8 oz (103.329 kg)  BMI 34.64 kg/m2 Well nourished, well developed, in no acute distress HEENT: normal Neck: no JVD, no carotid bruits Cardiac:  normal S1, S2; RRR;  Lungs:  clear to auscultation bilaterally, no wheezing, rhonchi or rales Abd: soft, nontender, no hepatomegaly Ext: no edema, left ankle edema Skin: warm and dry Neuro:   no focal abnormalities noted  Stress test:  No ischemia.  EF 44% in 12/14.     ASSESSMENT AND PLAN:  1. Coronary atherosclerosis of native coronary artery  Start Nitroglycerin 0.4 mg tablet, 0.4 mg, 1 tablet as directed, SL, as directed prn chest pain, 1 vial, Refills 6 Started Viagra Tablet, 50 MG, 1 tablet as needed, Orally, Once a day, 30 day(s), 10, Refills 1, but he did not use it.  He had used it in the past without success. IMAGING: EKG   Stegall,Amy 07/14/2013 10:51:37 AM > Alexzandria Massman,JAY 07/14/2013 10:57:21 AM > NSR, NSST   Notes: Some distal LAD disease,  medically managed. No indigestion like he had with MI and with exertion before the stents. OK to use viagra since he is not using nitrates. Has a vial of NTG at home. No angina since the stress test.  Increase walking 5 days, 30 min/day.   2. Hypercholesterolemia, Mixed  Continue Atorvastatin Calcium Tablet, 10 MG, 1 tablet, Orally, Once a day Notes: LDL controlled in 8/13. Now in [facility] study.  3. Hypertension  Continue Diovan Tablet, 320 MG, 1 tablet, Orally, Once a day Notes: COntrolled. Not checking at home.  Would check at home.  4. Obesity  Notes: Continue to try to lose weight through diet control. Finishing cardiac rehab.    5. Preop evaluation: No further cardiac testing needed before biopsy.  OK to stop Plavix 5 days before procedure.  Experimental medicine is not a blood thinner.  It is a lipid lowering drug.    Signed, Fredric Mare, MD, [facility] 10/06/2013 12:22 PM //

## 2013-10-06 NOTE — Patient Instructions (Signed)
Your physician wants you to follow-up in: 4 months You will receive a reminder letter in the mail two months in advance. If you don't receive a letter, please call our office to schedule the follow-up appointment.     .Your physician recommends that you continue on your current medications as directed. Please refer to the Current Medication list given to you today.  

## 2013-10-10 ENCOUNTER — Encounter: Payer: Self-pay | Admitting: Pharmacist

## 2013-10-27 ENCOUNTER — Telehealth: Payer: Self-pay | Admitting: Interventional Cardiology

## 2013-10-27 NOTE — Telephone Encounter (Signed)
New problem   Need to know status of cardiac clearance on Plavix and Placebo for 5 days prior to biospy. Please fax [phone] and call.

## 2013-10-28 NOTE — Telephone Encounter (Signed)
Follow Up  Caryl Pina Returned call.

## 2013-10-28 NOTE — Telephone Encounter (Signed)
lmtrc to verify what pt is having done and also see what she means by placebo.

## 2013-10-28 NOTE — Telephone Encounter (Signed)
lmtrc

## 2013-11-03 NOTE — Telephone Encounter (Signed)
I will forward to dr/cma.

## 2013-11-03 NOTE — Telephone Encounter (Signed)
Follow up    Status of medical clearance for patient prostate biopsy . Plavix  & Placebo need to stop 5 days prior to biospy .    Schedule on  1/27

## 2013-11-04 NOTE — Telephone Encounter (Signed)
Spoke with Gregory [name] and pt is going to have a biopsy done by Dr. Jasmine December. Pt needs to hold plavix and placebo ( supposedly we have him on this medication).

## 2013-11-06 NOTE — Telephone Encounter (Signed)
Since stent is more than a year-old, okay to hold Plavix. Please address research drug. 

## 2013-11-06 NOTE — Telephone Encounter (Signed)
Gregory [name], pt is in [place] and pt told Dr. Jasmine December this is a placebo. They need to know if it is okay for pt to take prior to biopsy. We have never stopped this before, is it okay to continue for biopsy?

## 2013-11-06 NOTE — Telephone Encounter (Signed)
Patient is on a study drug for cholesterol (Evacetrapib - a CETP inhibitor) which doesn't affect bleeding or clotting.  This shouldn't affect his biopsy.  He should continue his study medication and hold the Plavix.

## 2013-11-06 NOTE — Telephone Encounter (Signed)
I will route to Dr Hassell Done assistant

## 2013-11-06 NOTE — Telephone Encounter (Signed)
Since stent is more than a year-old, okay to hold Plavix. Please address research drug.

## 2013-11-07 NOTE — Telephone Encounter (Signed)
Per Dr. Irish Lack pt can hold plavix 7 days prior to biopsy as requested by Dr. Jasmine December. Will fax note to [place] at [phone].

## 2013-11-10 ENCOUNTER — Telehealth: Payer: Self-pay | Admitting: Interventional Cardiology

## 2013-11-10 NOTE — Telephone Encounter (Signed)
Returned call to Amy at [employer] stated patient will need to hold coumadin 7 days prior to prostate surgery not 5 days.Stated she will need a note stating ok to hold coumadin 7 days prior to surgery with a stamped signature of Dr.Varanasi.Message sent to Amy Dr.Varanasi's nurse.

## 2013-11-10 NOTE — Telephone Encounter (Signed)
New message    Need clearance for pt to stop coumadin for 7 days for a procedure.  Need to start this tomorrow.  pls call back today.  Nurse has been talking to Amy regarding this

## 2013-11-11 NOTE — Telephone Encounter (Signed)
Pt is taking plavix not coumadin. Note was faxed last week with Dr. Hassell Done ok, but I will call [facility] and see if they need more info.

## 2013-11-11 NOTE — Telephone Encounter (Signed)
lmtrc

## 2013-11-14 NOTE — Telephone Encounter (Signed)
Spoke with Glenard Haring at [employer] and they have received everything they need!

## 2013-11-17 ENCOUNTER — Ambulatory Visit
Admission: RE | Admit: 2013-11-17 | Discharge: 2013-11-17 | Disposition: A | Discharge status: Home / Self Care | Payer: 59 | Source: Ambulatory Visit | Attending: Nurse Practitioner | Admitting: Nurse Practitioner

## 2013-11-17 ENCOUNTER — Other Ambulatory Visit: Payer: Self-pay | Admitting: Nurse Practitioner

## 2013-11-17 DIAGNOSIS — R109 Unspecified abdominal pain: Secondary | ICD-10-CM

## 2013-11-24 ENCOUNTER — Other Ambulatory Visit (HOSPITAL_COMMUNITY): Payer: Self-pay | Admitting: Urology

## 2013-11-24 DIAGNOSIS — C61 Malignant neoplasm of prostate: Secondary | ICD-10-CM

## 2013-12-01 ENCOUNTER — Ambulatory Visit (INDEPENDENT_AMBULATORY_CARE_PROVIDER_SITE_OTHER): Payer: 59 | Admitting: Family Medicine

## 2013-12-01 VITALS — BP 123/84 | Ht 68.0 in | Wt 230.0 lb

## 2013-12-01 DIAGNOSIS — E119 Type 2 diabetes mellitus without complications: Secondary | ICD-10-CM

## 2013-12-01 NOTE — Progress Notes (Signed)
Subjective:  Patient presents today for 3 month diabetes follow-up as part of the employer-sponsored [employer] to Well[employer] program. Current diabetes regimen includes Lantus 25 units daily, Januvia 50 mg BID, Metformin 1000 mg BID. Patient also continues on daily ASA, ARB and statin.   Patient was recently diagnosed with prostate cancer. He has had a biopsy but is still waiting on staging. He is seeing Dr. Jasmine December with [facility]. He has an appointment for additional testing/imaging 2/23 and then he will follow up with Dr. Jasmine December the next week.   He states that since his diagnosis that he has "fallen off the wagon" somewhat.   He has an appointment pending with Dr. Delfina Redwood next month.     Disease Assessments:  Diabetes:  Type of Diabetes: Type 2; Sees Diabetes provider 4 or more times per year; Diabetes Education Comprehensive group @ [facility]; MD managing Diabetes [place] Carol; Year of diagnosis 2010; takes medications as prescribed; uses glucometer; checks feet daily;   checks blood glucose less than 1 time a week; takes an aspirin a day;   Highest CBG 95; Lowest CBG 55; hypoglycemia frequency never; Other Diabetes History: Patient has three readings in the past three months. He did check his blood sugar today and it was 86. He had one low reading in December of 55, but he doesn't remember what was going on when he went low.   Patient agreed to start checking daily when he takes Lantus. He states that never misses a dose of Lantus.    Physical Activity-  He states that he gets a lot of physical activity at work. 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   He has not been walking outside of work since his cancer diagnosis. He states that he joined the new [employer] at [employer] and he wants to start walking.   Nutrition-   He states that things are about the same. He is still eating biscuits for breakfast (fast food) twice a week. He goes out to eat  every Saturday. He likes to get broiled meats.   His weight has increased about 15 pounds since his last appointment with me. He states that he doesn't know what he has done to gain the weight. He doesn't feel like he is eating much but does admit his physical activity level has gone down.     Learning Preference Assessment: Learner: Patient, Significant Other Readiness to Learn Barriers: None Teaching Method: Explanation, Handout Evaluation of Learning: Can function independently and verbalize knowledge  Family History:  There is a family history of Diabetes, Heart Disease, Diabetes.   lung cancer mother with lung cancer deceased    Preventive Care:    Hemoglobin A1c: 12/01/2013 6.7    Colonoscopy: 12/02/2008  Dilated Eye Exam: 10/22/2012  Flu vaccine: 07/24/2011  Foot Exam: 06/10/2012       Vital Signs:  12/01/2013 4:06 PM (EST) Blood Pressure 123 / 84 mm/HgBMI 35.0; Height 5 ft 8 in; Weight 230 lbs    Testing:  Blood Sugar Tests:  Hemoglobin A1c: 6.7 resulted on 12/01/2013    Assessment/Plan: Patient is a 63 year old male with DM2. A1C today was 6.7% and is meeting goal of less than 7%. Patient has recently been diagnosed with prostate cancer and states that he has let a lot of things go since the diagnosis because he has been overwhelmed. He has gained 15 pounds since his last appointment with me 3 months  ago. Patient has only checked blood sugar three times in the last three months and is no longer exercising outside of work.   I discussed at length with patient how lifestyle changes that will improve diabetes will also help him to be better able to tolerate any treatments that he needs to undergo with his prostate cancer. Patient appears ready to make some changes. He recently signed up for a [employer] and wants to start walking. He states he wants to walk daily- we set a goal to walk 3-4 days a week for 30 minutes. Patient also agreed to start checking blood sugar daily  when he takes Lantus Insulin.   I discussed with patient logging his exercise into the [employer] wellness website. He admits that he does not know how to use a computer. I agreed to help teach him how to access the website and log in his physical activity so that he can make the 50 workouts by September. My hope is that I can teach him well enough so that he can use the computer at work to be able to log in his exercise information.   Follow up next week with patient for physical activity log; follow up with patient in 5 months for DM when I return from maternity leave. I will fax A1C information to Dr. Delfina Redwood..    Goals for Next Visit-  1. 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 Aim for 3-4 days a week for 30 minutes. 2. Put your blood sugar meter next to your Lantus pen. Check your blood sugar each morning.   Next visit to log in physical activity is Monday February 16th at 8 AM.    Next appointment to see me is Monday July 20th at 10 AM.

## 2013-12-15 ENCOUNTER — Other Ambulatory Visit (HOSPITAL_COMMUNITY): Payer: Self-pay | Admitting: Urology

## 2013-12-15 ENCOUNTER — Encounter (HOSPITAL_COMMUNITY)
Admission: RE | Admit: 2013-12-15 | Discharge: 2013-12-15 | Disposition: A | Discharge status: Home / Self Care | Payer: 59 | Source: Ambulatory Visit | Attending: Urology | Admitting: Urology

## 2013-12-15 ENCOUNTER — Ambulatory Visit (HOSPITAL_COMMUNITY)
Admission: RE | Admit: 2013-12-15 | Discharge: 2013-12-15 | Disposition: A | Discharge status: Home / Self Care | Payer: 59 | Source: Ambulatory Visit | Attending: Urology | Admitting: Urology

## 2013-12-15 DIAGNOSIS — I10 Essential (primary) hypertension: Secondary | ICD-10-CM | POA: Insufficient documentation

## 2013-12-15 DIAGNOSIS — C61 Malignant neoplasm of prostate: Secondary | ICD-10-CM

## 2013-12-15 DIAGNOSIS — E119 Type 2 diabetes mellitus without complications: Secondary | ICD-10-CM | POA: Insufficient documentation

## 2013-12-15 MED ORDER — TECHNETIUM TC 99M MEDRONATE IV KIT
27.0000 | PACK | Freq: Once | INTRAVENOUS | Status: AC | PRN
  Administered 2013-12-15: 27 via INTRAVENOUS

## 2013-12-25 ENCOUNTER — Other Ambulatory Visit: Payer: Self-pay | Admitting: Urology

## 2014-02-10 ENCOUNTER — Encounter (HOSPITAL_COMMUNITY): Payer: Self-pay | Admitting: Pharmacy Technician

## 2014-02-16 ENCOUNTER — Encounter (HOSPITAL_COMMUNITY): Payer: Self-pay

## 2014-02-16 ENCOUNTER — Encounter (HOSPITAL_COMMUNITY)
Admission: RE | Admit: 2014-02-16 | Discharge: 2014-02-16 | Disposition: A | Discharge status: Home / Self Care | Payer: 59 | Source: Ambulatory Visit | Attending: Urology | Admitting: Urology

## 2014-02-16 DIAGNOSIS — Z01812 Encounter for preprocedural laboratory examination: Secondary | ICD-10-CM | POA: Insufficient documentation

## 2014-02-16 LAB — BASIC METABOLIC PANEL
BUN: 17 mg/dL (ref 6–23)
CO2: 26 mEq/L (ref 19–32)
Calcium: 8.9 mg/dL (ref 8.4–10.5)
Chloride: 101 mEq/L (ref 96–112)
Creatinine, Ser: 1.26 mg/dL (ref 0.50–1.35)
GFR calc Af Amer: 68 mL/min — ABNORMAL LOW (ref >90)
GFR calc non Af Amer: 59 mL/min — ABNORMAL LOW (ref >90)
Glucose, Bld: 146 mg/dL — ABNORMAL HIGH (ref 70–99)
Potassium: 4.5 mEq/L (ref 3.7–5.3)
Sodium: 139 mEq/L (ref 137–147)

## 2014-02-16 LAB — CBC
HCT: 45.7 % (ref 39.0–52.0)
Hemoglobin: 15.1 g/dL (ref 13.0–17.0)
MCH: 30 pg (ref 26.0–34.0)
MCHC: 33 g/dL (ref 30.0–36.0)
MCV: 90.9 fL (ref 78.0–100.0)
Platelets: 179 10*3/uL (ref 150–400)
RBC: 5.03 MIL/uL (ref 4.22–5.81)
RDW: 13.6 % (ref 11.5–15.5)
WBC: 7.6 10*3/uL (ref 4.0–10.5)

## 2014-02-16 NOTE — Patient Instructions (Signed)
20     Your procedure is scheduled on:  Wednesday 02/25/2014  Report to [place] at  [place] AM.  Call this number if you have problems the night before or morning of surgery:   [phone]   Remember: DO NOT USE TAKE OR USE ANY DIABETIC MEDICATIONS THE MORNING OF SURGERY!             IF YOU USE CPAP,BRING MASK AND TUBING AM OF SURGERY!             IF YOU DO NOT HAVE YOUR TYPE AND SCREEN DRAWN AT PRE-ADMIT APPOINTMENT, YOU WILL HAVE IT DRAWN AM OF SURGERY!   Do not eat food or drink liquids AFTER MIDNIGHT!  Take these medicines the morning of surgery with A SIP OF WATER: Metoprolol    [facility] IS NOT RESPONSIBLE FOR ANY BELONGINGS OR VALUABLES BROUGHT TO HOSPITAL.  Marland Kitchen  Leave suitcase in the car. After surgery it may be brought to your room.  For patients admitted to the hospital, checkout time is 11:00 AM the day of              Discharge.    DO NOT WEAR  JEWELRY,MAKE-UP,LOTIONS,POWDERS,PERFUMES,CONTACTS , DENTURES OR BRIDGEWORK ,AND DO NOT WEAR FALSE EYELASHES                                    Patients discharged the day of surgery will not be allowed to drive home.  If going home the same day of surgery, must have someone stay with you  first 24 hrs.at home and arrange for someone to drive you home from the [facility]: N/A   Special Instructions:              Please read over the following fact sheets that you were given:             1. Buna.Tobin Chad     [phone]                              [facility] - Preparing for Surgery  Before surgery, you can play an important role.  Because skin is not sterile, your skin needs to be as free of germs as possible.  You can reduce the number of germs on you skin by washing with CHG (chlorahexidine gluconate) soap before surgery.  CHG is an antiseptic  cleaner which kills germs and bonds with the skin to continue killing germs even after washing.  Please DO NOT use if you have an allergy to CHG or antibacterial soaps.  If your skin becomes reddened/irritated stop using the CHG and inform your nurse when you arrive at Short Stay.  Do not shave (including legs and underarms) for at least 48 hours prior to the first CHG shower.  You may shave your face.  Please follow  these instructions carefully:   1.  Shower with CHG Soap the night before surgery and the  morning of Surgery.  2.  If you choose to wash your hair, wash your hair first as usual with your normal shampoo.  3.  After you shampoo, rinse your hair and body thoroughly to remove the   Shampoo.  4.  Use CHG as you would any other liquid soap.  You can apply chg directly       to the skin and wash gently with scrungie or a clean washcloth.  5.  Apply the CHG Soap to your body ONLY FROM THE NECK DOWN.        Do not use on open wounds or open sores.  Avoid contact with your eyes,ears, mouth and genitals (private parts).  Wash genitals (private parts)       with your normal soap.  6.  Wash thoroughly, paying special attention to the area where your surgery  will be performed.  7.  Thoroughly rinse your body with warm water from the neck down.  8.  DO NOT shower/wash with your normal soap after using and rinsing off the CHG Soap.  9.  Pat yourself dry with a clean towel.            10.  Wear clean pajamas.            11.  Place clean sheets on your bed the night of your first shower and do not sleep with pets.  Day of Surgery  Do not apply any lotions/deoderants the morning of surgery.  Please wear clean clothes to the hospital/surgery center.    Incentive Spirometer  An incentive spirometer is a tool that can help keep your lungs clear and active. This tool measures how well you are filling your lungs with each breath. Taking long deep breaths may help reverse or decrease the chance of  developing breathing (pulmonary) problems (especially infection) following:  A long period of time when you are unable to move or be active. BEFORE THE PROCEDURE   If the spirometer includes an indicator to show your best effort, your nurse or respiratory therapist will set it to a desired goal.  If possible, sit up straight or lean slightly forward. Try not to slouch.  Hold the incentive spirometer in an upright position. INSTRUCTIONS FOR USE  1. Sit on the edge of your bed if possible, or sit up as far as you can in bed or on a chair. 2. Hold the incentive spirometer in an upright position. 3. Breathe out normally. 4. Place the mouthpiece in your mouth and seal your lips tightly around it. 5. Breathe in slowly and as deeply as possible, raising the piston or the ball toward the top of the column. 6. Hold your breath for 3-5 seconds or for as long as possible. Allow the piston or ball to fall to the bottom of the column. 7. Remove the mouthpiece from your mouth and breathe out normally. 8. Rest for a few seconds and repeat Steps 1 through 7 at least 10 times every 1-2 hours when you are awake. Take your time and take a few normal breaths between deep breaths. 9. The spirometer may include an indicator to show your best effort. Use the indicator as a goal to work toward during each repetition. 10. After each set of 10 deep breaths, practice coughing to be sure your lungs are clear. If you have an incision (the cut made at  the time of surgery), support your incision when coughing by placing a pillow or rolled up towels firmly against it. Once you are able to get out of bed, walk around indoors and cough well. You may stop using the incentive spirometer when instructed by your caregiver.  RISKS AND COMPLICATIONS  Take your time so you do not get dizzy or light-headed.  If you are in pain, you may need to take or ask for pain medication before doing incentive spirometry. It is harder to take a  deep breath if you are having pain. AFTER USE  Rest and breathe slowly and easily.  It can be helpful to keep track of a log of your progress. Your caregiver can provide you with a simple table to help with this. If you are using the spirometer at home, follow these instructions: [place] IF:   You are having difficultly using the spirometer.  You have trouble using the spirometer as often as instructed.  Your pain medication is not giving enough relief while using the spirometer.  You develop fever of 100.5 F (38.1 C) or higher. SEEK IMMEDIATE MEDICAL CARE IF:   You cough up bloody sputum that had not been present before.  You develop fever of 102 F (38.9 C) or greater.  You develop worsening pain at or near the incision site. MAKE SURE YOU:   Understand these instructions.  Will watch your condition.  Will get help right away if you are not doing well or get worse. Document Released: 02/19/2007 Document Revised: 01/01/2012 Document Reviewed: 04/22/2007 ExitCare Patient Information 2014 Exit[employer], [place].   ________________________________________________________________________  WHAT IS A BLOOD TRANSFUSION? Blood Transfusion Information  A transfusion is the replacement of blood or some of its parts. Blood is made up of multiple cells which provide different functions.  Red blood cells carry oxygen and are used for blood loss replacement.  White blood cells fight against infection.  Platelets control bleeding.  Plasma helps clot blood.  Other blood products are available for specialized needs, such as hemophilia or other clotting disorders. BEFORE THE TRANSFUSION  Who gives blood for transfusions?   Healthy volunteers who are fully evaluated to make sure their blood is safe. This is blood bank blood. Transfusion therapy is the safest it has ever been in the practice of medicine. Before blood is taken from a donor, a complete history is taken to make  sure that person has no history of diseases nor engages in risky social behavior (examples are intravenous drug use or sexual activity with multiple partners). The donor's travel history is screened to minimize risk of transmitting infections, such as malaria. The donated blood is tested for signs of infectious diseases, such as HIV and hepatitis. The blood is then tested to be sure it is compatible with you in order to minimize the chance of a transfusion reaction. If you or a relative donates blood, this is often done in anticipation of surgery and is not appropriate for emergency situations. It takes many days to process the donated blood. RISKS AND COMPLICATIONS Although transfusion therapy is very safe and saves many lives, the main dangers of transfusion include:   Getting an infectious disease.  Developing a transfusion reaction. This is an allergic reaction to something in the blood you were given. Every precaution is taken to prevent this. The decision to have a blood transfusion has been considered carefully by your caregiver before blood is given. Blood is not given unless the benefits outweigh the  risks. AFTER THE TRANSFUSION  Right after receiving a blood transfusion, you will usually feel much better and more energetic. This is especially true if your red blood cells have gotten low (anemic). The transfusion raises the level of the red blood cells which carry oxygen, and this usually causes an energy increase.  The nurse administering the transfusion will monitor you carefully for complications. HOME CARE INSTRUCTIONS  No special instructions are needed after a transfusion. You may find your energy is better. Speak with your caregiver about any limitations on activity for underlying diseases you may have. SEEK MEDICAL CARE IF:   Your condition is not improving after your transfusion.  You develop redness or irritation at the intravenous (IV) site. SEEK IMMEDIATE MEDICAL CARE IF:   Any of the following symptoms occur over the next 12 hours:  Shaking chills.  You have a temperature by mouth above 102 F (38.9 C), not controlled by medicine.  Chest, back, or muscle pain.  People around you feel you are not acting correctly or are confused.  Shortness of breath or difficulty breathing.  Dizziness and fainting.  You get a rash or develop hives.  You have a decrease in urine output.  Your urine turns a dark color or changes to pink, red, or brown. Any of the following symptoms occur over the next 10 days:  You have a temperature by mouth above 102 F (38.9 C), not controlled by medicine.  Shortness of breath.  Weakness after normal activity.  The white part of the eye turns yellow (jaundice).  You have a decrease in the amount of urine or are urinating less often.  Your urine turns a dark color or changes to pink, red, or brown. Document Released: 10/06/2000 Document Revised: 01/01/2012 Document Reviewed: 05/25/2008 [facility] Patient Information 2014 [place], [place].  _______________________________________________________________________

## 2014-02-16 NOTE — Progress Notes (Signed)
02/16/14 0839  OBSTRUCTIVE SLEEP APNEA  Have you ever been diagnosed with sleep apnea through a sleep study? No  Do you snore loudly (loud enough to be heard through closed doors)?  1  Do you often feel tired, fatigued, or sleepy during the daytime? 0  Has anyone observed you stop breathing during your sleep? 0  Do you have, or are you being treated for high blood pressure? 1  BMI more than 35 kg/m2? 1  Age over 64 years old? 1  Neck circumference greater than 40 cm/16 inches? 1  Gender: 1  Obstructive Sleep Apnea Score 6  Score 4 or greater  Results sent to PCP

## 2014-02-16 NOTE — Progress Notes (Signed)
12/29/2013-surgical clearance from Dr. Irish Lack on chart.

## 2014-02-25 ENCOUNTER — Encounter (HOSPITAL_COMMUNITY)
Admission: RE | Disposition: A | Discharge status: Home / Self Care | Payer: Self-pay | Source: Ambulatory Visit | Attending: Urology

## 2014-02-25 ENCOUNTER — Encounter (HOSPITAL_COMMUNITY): Payer: Self-pay | Admitting: *Deleted

## 2014-02-25 ENCOUNTER — Ambulatory Visit (HOSPITAL_COMMUNITY): Payer: 59 | Admitting: Anesthesiology

## 2014-02-25 ENCOUNTER — Encounter (HOSPITAL_COMMUNITY): Payer: 59 | Admitting: Anesthesiology

## 2014-02-25 ENCOUNTER — Inpatient Hospital Stay (HOSPITAL_COMMUNITY)
Admission: RE | Admit: 2014-02-25 | Discharge: 2014-02-27 | DRG: 708 | Disposition: A | Discharge status: Home / Self Care | Payer: 59 | Source: Ambulatory Visit | Attending: Urology | Admitting: Urology

## 2014-02-25 DIAGNOSIS — I2510 Atherosclerotic heart disease of native coronary artery without angina pectoris: Secondary | ICD-10-CM | POA: Diagnosis present

## 2014-02-25 DIAGNOSIS — M129 Arthropathy, unspecified: Secondary | ICD-10-CM | POA: Diagnosis present

## 2014-02-25 DIAGNOSIS — K429 Umbilical hernia without obstruction or gangrene: Secondary | ICD-10-CM | POA: Diagnosis present

## 2014-02-25 DIAGNOSIS — Z01812 Encounter for preprocedural laboratory examination: Secondary | ICD-10-CM

## 2014-02-25 DIAGNOSIS — M109 Gout, unspecified: Secondary | ICD-10-CM | POA: Diagnosis present

## 2014-02-25 DIAGNOSIS — E119 Type 2 diabetes mellitus without complications: Secondary | ICD-10-CM | POA: Diagnosis present

## 2014-02-25 DIAGNOSIS — Z794 Long term (current) use of insulin: Secondary | ICD-10-CM

## 2014-02-25 DIAGNOSIS — C61 Malignant neoplasm of prostate: Principal | ICD-10-CM | POA: Diagnosis present

## 2014-02-25 DIAGNOSIS — Z7982 Long term (current) use of aspirin: Secondary | ICD-10-CM

## 2014-02-25 DIAGNOSIS — R972 Elevated prostate specific antigen [PSA]: Secondary | ICD-10-CM | POA: Diagnosis present

## 2014-02-25 DIAGNOSIS — Z825 Family history of asthma and other chronic lower respiratory diseases: Secondary | ICD-10-CM

## 2014-02-25 DIAGNOSIS — Z9861 Coronary angioplasty status: Secondary | ICD-10-CM

## 2014-02-25 DIAGNOSIS — E785 Hyperlipidemia, unspecified: Secondary | ICD-10-CM | POA: Diagnosis present

## 2014-02-25 DIAGNOSIS — Z7902 Long term (current) use of antithrombotics/antiplatelets: Secondary | ICD-10-CM

## 2014-02-25 DIAGNOSIS — Z79899 Other long term (current) drug therapy: Secondary | ICD-10-CM

## 2014-02-25 DIAGNOSIS — I10 Essential (primary) hypertension: Secondary | ICD-10-CM | POA: Diagnosis present

## 2014-02-25 DIAGNOSIS — I252 Old myocardial infarction: Secondary | ICD-10-CM

## 2014-02-25 HISTORY — PX: ROBOT ASSISTED LAPAROSCOPIC RADICAL PROSTATECTOMY: SHX5141

## 2014-02-25 HISTORY — PX: LYMPHADENECTOMY: SHX5960

## 2014-02-25 LAB — GLUCOSE, CAPILLARY
Glucose-Capillary: 145 mg/dL — ABNORMAL HIGH (ref 70–99)
Glucose-Capillary: 180 mg/dL — ABNORMAL HIGH (ref 70–99)
Glucose-Capillary: 215 mg/dL — ABNORMAL HIGH (ref 70–99)
Glucose-Capillary: 173 mg/dL — ABNORMAL HIGH (ref 70–99)

## 2014-02-25 LAB — TYPE AND SCREEN
ABO/RH(D): A POS
Antibody Screen: NEGATIVE

## 2014-02-25 LAB — CREATININE, SERUM
Creatinine, Ser: 1.43 mg/dL — ABNORMAL HIGH (ref 0.50–1.35)
GFR calc Af Amer: 59 mL/min — ABNORMAL LOW (ref >90)
GFR calc non Af Amer: 51 mL/min — ABNORMAL LOW (ref >90)

## 2014-02-25 LAB — CBC
HCT: 42.9 % (ref 39.0–52.0)
Hemoglobin: 14.1 g/dL (ref 13.0–17.0)
MCH: 29.7 pg (ref 26.0–34.0)
MCHC: 32.9 g/dL (ref 30.0–36.0)
MCV: 90.3 fL (ref 78.0–100.0)
Platelets: 164 10*3/uL (ref 150–400)
RBC: 4.75 MIL/uL (ref 4.22–5.81)
RDW: 13.6 % (ref 11.5–15.5)
WBC: 14.1 10*3/uL — ABNORMAL HIGH (ref 4.0–10.5)

## 2014-02-25 LAB — ABO/RH: ABO/RH(D): A POS

## 2014-02-25 SURGERY — ROBOTIC ASSISTED LAPAROSCOPIC RADICAL PROSTATECTOMY LEVEL 2
Anesthesia: General

## 2014-02-25 MED ORDER — INSULIN ASPART 100 UNIT/ML ~~LOC~~ SOLN: 0.0000 [IU] | Freq: Every day | SUBCUTANEOUS | Status: DC

## 2014-02-25 MED ORDER — ONDANSETRON HCL 4 MG/2ML IJ SOLN
INTRAMUSCULAR | Status: DC | PRN
  Administered 2014-02-25: 4 mg via INTRAVENOUS

## 2014-02-25 MED ORDER — SODIUM CHLORIDE 0.9 % IV BOLUS (SEPSIS): 1000.0000 mL | Freq: Once | INTRAVENOUS | Status: DC

## 2014-02-25 MED ORDER — SODIUM CHLORIDE 0.9 % IR SOLN
Status: DC | PRN
  Administered 2014-02-25: 300 mL via INTRAVESICAL

## 2014-02-25 MED ORDER — METOPROLOL TARTRATE 12.5 MG HALF TABLET
12.5000 mg | ORAL_TABLET | Freq: Two times a day (BID) | ORAL | Status: DC
  Administered 2014-02-25 – 2014-02-27 (×4): 12.5 mg via ORAL
  Filled 2014-02-25 (×5): qty 1

## 2014-02-25 MED ORDER — HEPARIN SODIUM (PORCINE) 1000 UNIT/ML IJ SOLN
INTRAMUSCULAR | Status: AC
  Filled 2014-02-25: qty 1

## 2014-02-25 MED ORDER — SODIUM CHLORIDE 0.9 % IV SOLN
INTRAVENOUS | Status: DC
  Administered 2014-02-25 – 2014-02-26 (×3): via INTRAVENOUS

## 2014-02-25 MED ORDER — NEOSTIGMINE METHYLSULFATE 10 MG/10ML IV SOLN
INTRAVENOUS | Status: AC
  Filled 2014-02-25: qty 1

## 2014-02-25 MED ORDER — BELLADONNA ALKALOIDS-OPIUM 16.2-60 MG RE SUPP: 1.0000 | Freq: Four times a day (QID) | RECTAL | Status: DC | PRN

## 2014-02-25 MED ORDER — ATORVASTATIN CALCIUM 10 MG PO TABS
10.0000 mg | ORAL_TABLET | Freq: Every day | ORAL | Status: DC
  Administered 2014-02-25 – 2014-02-27 (×3): 10 mg via ORAL
  Filled 2014-02-25 (×3): qty 1

## 2014-02-25 MED ORDER — CISATRACURIUM BESYLATE (PF) 10 MG/5ML IV SOLN
INTRAVENOUS | Status: DC | PRN
  Administered 2014-02-25: 2 mg via INTRAVENOUS
  Administered 2014-02-25: 6 mg via INTRAVENOUS
  Administered 2014-02-25: 4 mg via INTRAVENOUS
  Administered 2014-02-25: 10 mg via INTRAVENOUS
  Administered 2014-02-25: 2 mg via INTRAVENOUS
  Administered 2014-02-25: 4 mg via INTRAVENOUS

## 2014-02-25 MED ORDER — CEFAZOLIN SODIUM-DEXTROSE 2-3 GM-% IV SOLR
INTRAVENOUS | Status: AC
  Filled 2014-02-25: qty 50

## 2014-02-25 MED ORDER — PROPOFOL 10 MG/ML IV BOLUS
INTRAVENOUS | Status: DC | PRN
Start: 2014-02-25 — End: 2014-02-25
  Administered 2014-02-25: 150 mg via INTRAVENOUS

## 2014-02-25 MED ORDER — MIDAZOLAM HCL 5 MG/5ML IJ SOLN
INTRAMUSCULAR | Status: DC | PRN
  Administered 2014-02-25: 2 mg via INTRAVENOUS

## 2014-02-25 MED ORDER — HYOSCYAMINE SULFATE 0.125 MG SL SUBL
0.1250 mg | SUBLINGUAL_TABLET | SUBLINGUAL | Status: DC | PRN
  Filled 2014-02-25: qty 1

## 2014-02-25 MED ORDER — FENTANYL CITRATE 0.05 MG/ML IJ SOLN
INTRAMUSCULAR | Status: AC
  Filled 2014-02-25: qty 5

## 2014-02-25 MED ORDER — LIDOCAINE HCL (CARDIAC) 20 MG/ML IV SOLN
INTRAVENOUS | Status: AC
  Filled 2014-02-25: qty 5

## 2014-02-25 MED ORDER — IRBESARTAN 300 MG PO TABS
300.0000 mg | ORAL_TABLET | Freq: Every day | ORAL | Status: DC
  Administered 2014-02-26 – 2014-02-27 (×2): 300 mg via ORAL
  Filled 2014-02-25 (×2): qty 1

## 2014-02-25 MED ORDER — METFORMIN HCL 500 MG PO TABS
1000.0000 mg | ORAL_TABLET | Freq: Two times a day (BID) | ORAL | Status: DC
  Administered 2014-02-26: 1000 mg via ORAL
  Filled 2014-02-25 (×3): qty 2

## 2014-02-25 MED ORDER — LACTATED RINGERS IV SOLN
INTRAVENOUS | Status: DC | PRN
  Administered 2014-02-25 (×3): via INTRAVENOUS

## 2014-02-25 MED ORDER — ONDANSETRON HCL 4 MG/2ML IJ SOLN
INTRAMUSCULAR | Status: AC
  Filled 2014-02-25: qty 2

## 2014-02-25 MED ORDER — CISATRACURIUM BESYLATE 20 MG/10ML IV SOLN
INTRAVENOUS | Status: AC
  Filled 2014-02-25: qty 10

## 2014-02-25 MED ORDER — INSULIN GLARGINE 100 UNIT/ML ~~LOC~~ SOLN
25.0000 [IU] | Freq: Every morning | SUBCUTANEOUS | Status: DC
  Administered 2014-02-26 – 2014-02-27 (×2): 25 [IU] via SUBCUTANEOUS
  Filled 2014-02-25 (×2): qty 0.25

## 2014-02-25 MED ORDER — NEOSTIGMINE METHYLSULFATE 10 MG/10ML IV SOLN
INTRAVENOUS | Status: DC | PRN
  Administered 2014-02-25: 4 mg via INTRAVENOUS

## 2014-02-25 MED ORDER — OXYCODONE HCL 5 MG PO TABS
5.0000 mg | ORAL_TABLET | ORAL | Status: DC | PRN
  Administered 2014-02-25 – 2014-02-26 (×5): 10 mg via ORAL
  Filled 2014-02-25 (×5): qty 2

## 2014-02-25 MED ORDER — GLYCOPYRROLATE 0.2 MG/ML IJ SOLN
INTRAMUSCULAR | Status: AC
  Filled 2014-02-25: qty 3

## 2014-02-25 MED ORDER — HYDROMORPHONE HCL PF 2 MG/ML IJ SOLN
INTRAMUSCULAR | Status: AC
  Filled 2014-02-25: qty 1

## 2014-02-25 MED ORDER — HYDROMORPHONE HCL PF 1 MG/ML IJ SOLN
INTRAMUSCULAR | Status: DC | PRN
  Administered 2014-02-25 (×4): 0.5 mg via INTRAVENOUS

## 2014-02-25 MED ORDER — INSULIN ASPART 100 UNIT/ML ~~LOC~~ SOLN
0.0000 [IU] | Freq: Three times a day (TID) | SUBCUTANEOUS | Status: DC
  Administered 2014-02-25: 7 [IU] via SUBCUTANEOUS
  Administered 2014-02-26: 4 [IU] via SUBCUTANEOUS
  Administered 2014-02-26 – 2014-02-27 (×4): 3 [IU] via SUBCUTANEOUS

## 2014-02-25 MED ORDER — BISACODYL 10 MG RE SUPP
10.0000 mg | Freq: Two times a day (BID) | RECTAL | Status: DC
  Administered 2014-02-26 – 2014-02-27 (×3): 10 mg via RECTAL
  Filled 2014-02-25 (×3): qty 1

## 2014-02-25 MED ORDER — NITROGLYCERIN 0.4 MG SL SUBL: 0.4000 mg | SUBLINGUAL_TABLET | SUBLINGUAL | Status: DC | PRN

## 2014-02-25 MED ORDER — LACTATED RINGERS IV SOLN: INTRAVENOUS | Status: DC

## 2014-02-25 MED ORDER — BUPIVACAINE-EPINEPHRINE (PF) 0.25% -1:200000 IJ SOLN
INTRAMUSCULAR | Status: AC
  Filled 2014-02-25: qty 30

## 2014-02-25 MED ORDER — HEPARIN SODIUM (PORCINE) 5000 UNIT/ML IJ SOLN
5000.0000 [IU] | INTRAMUSCULAR | Status: AC
  Administered 2014-02-25: 5000 [IU] via SUBCUTANEOUS
  Filled 2014-02-25: qty 1

## 2014-02-25 MED ORDER — HEPARIN SODIUM (PORCINE) 5000 UNIT/ML IJ SOLN
5000.0000 [IU] | Freq: Three times a day (TID) | INTRAMUSCULAR | Status: DC
  Administered 2014-02-25 – 2014-02-27 (×5): 5000 [IU] via SUBCUTANEOUS
  Filled 2014-02-25 (×9): qty 1

## 2014-02-25 MED ORDER — ALLOPURINOL 300 MG PO TABS
300.0000 mg | ORAL_TABLET | Freq: Every day | ORAL | Status: DC
  Administered 2014-02-25 – 2014-02-27 (×3): 300 mg via ORAL
  Filled 2014-02-25 (×3): qty 1

## 2014-02-25 MED ORDER — BACITRACIN-NEOMYCIN-POLYMYXIN 400-5-5000 EX OINT: 1.0000 "application " | TOPICAL_OINTMENT | Freq: Three times a day (TID) | CUTANEOUS | Status: DC | PRN

## 2014-02-25 MED ORDER — CEFAZOLIN SODIUM-DEXTROSE 2-3 GM-% IV SOLR
2.0000 g | Freq: Three times a day (TID) | INTRAVENOUS | Status: AC
  Administered 2014-02-25 – 2014-02-26 (×2): 2 g via INTRAVENOUS
  Filled 2014-02-25 (×2): qty 50

## 2014-02-25 MED ORDER — SENNOSIDES-DOCUSATE SODIUM 8.6-50 MG PO TABS
1.0000 | ORAL_TABLET | Freq: Two times a day (BID) | ORAL | Status: DC
  Administered 2014-02-25 – 2014-02-27 (×4): 1 via ORAL
  Filled 2014-02-25 (×5): qty 1

## 2014-02-25 MED ORDER — ONDANSETRON HCL 4 MG/2ML IJ SOLN: 4.0000 mg | INTRAMUSCULAR | Status: DC | PRN

## 2014-02-25 MED ORDER — LACTATED RINGERS IR SOLN
Status: DC | PRN
  Administered 2014-02-25: 200 mL

## 2014-02-25 MED ORDER — EPHEDRINE SULFATE 50 MG/ML IJ SOLN
INTRAMUSCULAR | Status: AC
  Filled 2014-02-25: qty 1

## 2014-02-25 MED ORDER — AMLODIPINE BESYLATE 10 MG PO TABS
10.0000 mg | ORAL_TABLET | Freq: Once | ORAL | Status: AC
  Administered 2014-02-25: 10 mg via ORAL
  Filled 2014-02-25: qty 1

## 2014-02-25 MED ORDER — PROMETHAZINE HCL 25 MG/ML IJ SOLN
6.2500 mg | INTRAMUSCULAR | Status: DC | PRN
Start: 2014-02-25 — End: 2014-02-25

## 2014-02-25 MED ORDER — LIDOCAINE HCL (PF) 2 % IJ SOLN
INTRAMUSCULAR | Status: DC | PRN
  Administered 2014-02-25: 100 mg via INTRADERMAL

## 2014-02-25 MED ORDER — HYDROMORPHONE HCL PF 1 MG/ML IJ SOLN: 0.2500 mg | INTRAMUSCULAR | Status: DC | PRN

## 2014-02-25 MED ORDER — METFORMIN HCL 500 MG PO TABS
1000.0000 mg | ORAL_TABLET | Freq: Two times a day (BID) | ORAL | Status: DC
Start: 2014-02-25 — End: 2014-02-25

## 2014-02-25 MED ORDER — AMLODIPINE BESYLATE-VALSARTAN 10-320 MG PO TABS: 1.0000 | ORAL_TABLET | Freq: Every morning | ORAL | Status: DC

## 2014-02-25 MED ORDER — FENTANYL CITRATE 0.05 MG/ML IJ SOLN
INTRAMUSCULAR | Status: AC
Start: 2014-02-25 — End: 2014-02-25
  Filled 2014-02-25: qty 5

## 2014-02-25 MED ORDER — GLYCOPYRROLATE 0.2 MG/ML IJ SOLN
INTRAMUSCULAR | Status: DC | PRN
  Administered 2014-02-25: 0.6 mg via INTRAVENOUS

## 2014-02-25 MED ORDER — LINAGLIPTIN 5 MG PO TABS
5.0000 mg | ORAL_TABLET | Freq: Every day | ORAL | Status: DC
  Administered 2014-02-26 – 2014-02-27 (×2): 5 mg via ORAL
  Filled 2014-02-25 (×2): qty 1

## 2014-02-25 MED ORDER — ATROPINE SULFATE 0.4 MG/ML IJ SOLN
INTRAMUSCULAR | Status: AC
  Filled 2014-02-25: qty 1

## 2014-02-25 MED ORDER — OXYBUTYNIN CHLORIDE ER 10 MG PO TB24
10.0000 mg | ORAL_TABLET | Freq: Every day | ORAL | Status: DC
  Administered 2014-02-26 (×2): 10 mg via ORAL
  Filled 2014-02-25 (×4): qty 1

## 2014-02-25 MED ORDER — PROPOFOL 10 MG/ML IV BOLUS
INTRAVENOUS | Status: AC
  Filled 2014-02-25: qty 20

## 2014-02-25 MED ORDER — MORPHINE SULFATE 2 MG/ML IJ SOLN: 2.0000 mg | INTRAMUSCULAR | Status: DC | PRN

## 2014-02-25 MED ORDER — BUPIVACAINE-EPINEPHRINE 0.25% -1:200000 IJ SOLN
INTRAMUSCULAR | Status: DC | PRN
  Administered 2014-02-25: 30 mL

## 2014-02-25 MED ORDER — DEXTROSE 5 % IV SOLN
3.0000 g | INTRAVENOUS | Status: AC
  Administered 2014-02-25: 3 g via INTRAVENOUS
  Filled 2014-02-25: qty 3000

## 2014-02-25 MED ORDER — STERILE WATER FOR IRRIGATION IR SOLN
Status: DC | PRN
  Administered 2014-02-25: 1500 mL

## 2014-02-25 MED ORDER — AMLODIPINE BESYLATE 10 MG PO TABS
10.0000 mg | ORAL_TABLET | Freq: Every day | ORAL | Status: DC
  Administered 2014-02-26 – 2014-02-27 (×2): 10 mg via ORAL
  Filled 2014-02-25 (×2): qty 1

## 2014-02-25 MED ORDER — SODIUM CHLORIDE 0.9 % IJ SOLN
INTRAMUSCULAR | Status: AC
  Filled 2014-02-25: qty 10

## 2014-02-25 MED ORDER — MEPERIDINE HCL 50 MG/ML IJ SOLN: 6.2500 mg | INTRAMUSCULAR | Status: DC | PRN

## 2014-02-25 MED ORDER — FENTANYL CITRATE 0.05 MG/ML IJ SOLN
INTRAMUSCULAR | Status: DC | PRN
  Administered 2014-02-25 (×2): 100 ug via INTRAVENOUS
  Administered 2014-02-25: 50 ug via INTRAVENOUS
  Administered 2014-02-25: 100 ug via INTRAVENOUS
  Administered 2014-02-25: 50 ug via INTRAVENOUS
  Administered 2014-02-25: 100 ug via INTRAVENOUS
  Administered 2014-02-25: 50 ug via INTRAVENOUS
  Administered 2014-02-25: 100 ug via INTRAVENOUS

## 2014-02-25 MED ORDER — MIDAZOLAM HCL 2 MG/2ML IJ SOLN
INTRAMUSCULAR | Status: AC
  Filled 2014-02-25: qty 2

## 2014-02-25 MED ORDER — CEFAZOLIN SODIUM-DEXTROSE 2-3 GM-% IV SOLR
2.0000 g | Freq: Once | INTRAVENOUS | Status: AC
  Administered 2014-02-25: 2 g via INTRAVENOUS

## 2014-02-25 MED ORDER — BUPIVACAINE-EPINEPHRINE 0.25% -1:200000 IJ SOLN
INTRAMUSCULAR | Status: AC
  Filled 2014-02-25: qty 1

## 2014-02-25 MED ORDER — DEXTROSE 5 % IV SOLN: 3.0000 g | Freq: Once | INTRAVENOUS | Status: DC

## 2014-02-25 MED ORDER — SUCCINYLCHOLINE CHLORIDE 20 MG/ML IJ SOLN
INTRAMUSCULAR | Status: DC | PRN
  Administered 2014-02-25: 140 mg via INTRAVENOUS

## 2014-02-25 SURGICAL SUPPLY — 56 items
APPLIER CLIP 5 13 M/L LIGAMAX5 (MISCELLANEOUS)
APR CLP MED LRG 5 ANG JAW (MISCELLANEOUS)
CABLE HIGH FREQUENCY MONO STRZ (ELECTRODE) ×1 IMPLANT
CANISTER SUCTION 2500CC (MISCELLANEOUS) ×2 IMPLANT
CATH FOLEY 2WAY SLVR  5CC 18FR (CATHETERS) ×2
CATH FOLEY 2WAY SLVR 30CC 16FR (CATHETERS) ×3 IMPLANT
CATH FOLEY 2WAY SLVR 5CC 18FR (CATHETERS) IMPLANT
CATH ROBINSON RED A/P 16FR (CATHETERS) ×3 IMPLANT
CATH ROBINSON RED A/P 8FR (CATHETERS) ×2 IMPLANT
CATH TIEMANN FOLEY 18FR 5CC (CATHETERS) ×2 IMPLANT
CATH URET 5FR 28IN OPEN ENDED (CATHETERS) IMPLANT
CHLORAPREP W/TINT 26ML (MISCELLANEOUS) ×3 IMPLANT
CLIP APPLIE 5 13 M/L LIGAMAX5 (MISCELLANEOUS) ×2 IMPLANT
CLIP LIGATING HEM O LOK PURPLE (MISCELLANEOUS) ×7 IMPLANT
CLOTH BEACON ORANGE TIMEOUT ST (SAFETY) ×3 IMPLANT
COVER SURGICAL LIGHT HANDLE (MISCELLANEOUS) ×3 IMPLANT
COVER TIP SHEARS 8 DVNC (MISCELLANEOUS) ×2 IMPLANT
COVER TIP SHEARS 8MM DA VINCI (MISCELLANEOUS) ×1
CUTTER ECHEON FLEX ENDO 45 340 (ENDOMECHANICALS) ×3 IMPLANT
DECANTER SPIKE VIAL GLASS SM (MISCELLANEOUS) ×3 IMPLANT
DRAPE SURG IRRIG POUCH 19X23 (DRAPES) ×3 IMPLANT
DRSG TEGADERM 2-3/8X2-3/4 SM (GAUZE/BANDAGES/DRESSINGS) ×4 IMPLANT
DRSG TEGADERM 4X4.75 (GAUZE/BANDAGES/DRESSINGS) ×2 IMPLANT
DRSG TEGADERM 6X8 (GAUZE/BANDAGES/DRESSINGS) ×6 IMPLANT
ELECT REM PT RETURN 9FT ADLT (ELECTROSURGICAL) ×3
ELECTRODE REM PT RTRN 9FT ADLT (ELECTROSURGICAL) ×2 IMPLANT
GAUZE SPONGE 2X2 8PLY STRL LF (GAUZE/BANDAGES/DRESSINGS) ×2 IMPLANT
GLOVE BIOGEL M 7.0 STRL (GLOVE) ×2 IMPLANT
GLOVE BIOGEL PI IND STRL 7.5 (GLOVE) ×2 IMPLANT
GLOVE BIOGEL PI INDICATOR 7.5 (GLOVE) ×1
GLOVE ECLIPSE 7.0 STRL STRAW (GLOVE) ×2 IMPLANT
GOWN STRL REUS W/TWL LRG LVL3 (GOWN DISPOSABLE) ×6 IMPLANT
GOWN STRL REUS W/TWL XL LVL3 (GOWN DISPOSABLE) ×6 IMPLANT
HOLDER FOLEY CATH W/STRAP (MISCELLANEOUS) ×3 IMPLANT
IV LACTATED RINGERS 1000ML (IV SOLUTION) ×3 IMPLANT
KIT ACCESSORY DA VINCI DISP (KITS) ×1
KIT ACCESSORY DVNC DISP (KITS) ×2 IMPLANT
MANIFOLD NEPTUNE II (INSTRUMENTS) ×1 IMPLANT
NDL SAFETY ECLIPSE 18X1.5 (NEEDLE) ×2 IMPLANT
NEEDLE HYPO 18GX1.5 SHARP (NEEDLE) ×3
PACK ROBOT UROLOGY CUSTOM (CUSTOM PROCEDURE TRAY) ×3 IMPLANT
RELOAD GREEN ECHELON 45 (STAPLE) ×3 IMPLANT
SCRUB PCMX 4 OZ (MISCELLANEOUS) IMPLANT
SET TUBE IRRIG SUCTION NO TIP (IRRIGATION / IRRIGATOR) ×3 IMPLANT
SOLUTION ELECTROLUBE (MISCELLANEOUS) ×3 IMPLANT
SPONGE GAUZE 2X2 STER 10/PKG (GAUZE/BANDAGES/DRESSINGS) ×1
SUT VIC AB 3-0 SH 27 (SUTURE) ×3
SUT VIC AB 3-0 SH 27X BRD (SUTURE) IMPLANT
SUT VICRYL 0 UR6 27IN ABS (SUTURE) ×8 IMPLANT
SUT VLOC BARB 180 ABS3/0GR12 (SUTURE) ×12
SUTURE VLOC BRB 180 ABS3/0GR12 (SUTURE) IMPLANT
SYR 27GX1/2 1ML LL SAFETY (SYRINGE) ×3 IMPLANT
TOWEL OR 17X26 10 PK STRL BLUE (TOWEL DISPOSABLE) ×4 IMPLANT
TOWEL OR NON WOVEN STRL DISP B (DISPOSABLE) ×3 IMPLANT
TROCAR 12M 150ML BLUNT (TROCAR) ×1 IMPLANT
WATER STERILE IRR 1500ML POUR (IV SOLUTION) ×6 IMPLANT

## 2014-02-25 NOTE — Op Note (Signed)
DATE OF PROCEDURE: 02/25/14   OPERATIVE REPORT  SURGEON: Rolan Bucco, MD  ASSISTANT:  Leta Baptist, PA   PREOPERATIVE DIAGNOSIS: Prostate cancer. Umbilical hernia. POSTOPERATIVE DIAGNOSIS: Prostate cancer. Umbilical hernia.  PROCEDURE PERFORMED:  Robotic-assisted laparoscopic radical prostatectomy Bilateral non- nerve sparing approach.  Bilateral pelvic lymph node dissection. Umbilical hernia repair (without mesh). Cystorrhaphy.  ESTIMATED BLOOD LOSS: 150 cc.  DRAINS: Jackson-Pratt drain, left lower quadrant and Foley catheter.   SPECIMEN: Prostate sent with seminal vesicles in its entirety.   FINDINGS: Enlarged firm lymph nodes in the pelvis bilaterally.   HISTORY OF PRESENT ILLNESS: 63 year old male found to have Gleason 4+4 equals 8 prostate cancer based on biopsy do to an elevated PSA.  After evaluation and discussion of management options, he elected for surgical extirpation of his prostate.   PROCEDURE IN DETAIL: Informed consent was obtained. He received subcutaneous heparin for DVT prophylaxis before being taken to the OR. The patient was taken to the operating room, where he was placed in the supine position. IV antibiotics were infused and general anesthesia was induced. A time- out was performed, in which the correct patient, surgical site, and procedure were identified and agreed upon by the team. Hair was removed  from his abdomen and genitals and then he was placed in a dorsal  lithotomy position. All pertinent neurovascular pressure points were padded appropriately. His arms were tucked to the side using gel padding. After this, his abdomen and genitals were prepped and draped in the usual sterile fashion. A Foley catheter was placed on the field, 10 cc of sterile water was placed into the balloon.   He was then placed in a Trendelenburg position. Access was gained for laparoscopic procedure by using the Hasson technique by cutting down to the fascia in the midline  above the umbilicus. Once the peritoneum was accessed, a 10 mm  port was placed and abdomen was insufflated with carbon dioxide. Opening pressures were initially low so insufflation was continued. Next, markings were made for placement of the 1st & 3rd robotic arm ports in the usual areas with the ports being placed approximately 10 cm from the midline on either  side of camera (2nd) arm.  A 10 mm assistant port was placed on the far right lateral side of the abdomen. A 5 mm assistant port was placed between the right robotic (1st arm) and the camera port (2nd arm). The 4th robotic arm port was placed at the far left lateral side of the abdomen. All these were placed under direct visualization, and the robot was docked to the robotic ports.  After this was done, the urachal remnant was identified and divided and the bladder was  Dissected from the anterior wall of the abdomen. This was taken down to  the endopelvic fascia bilaterally and the perineum was split down to the  vas deferens bilaterally. The vas deferens was divided bipolar and monopolar electrocautery. After this was done, the prograsp was used to  retract the bladder cephalad.   Attention was turned to the left pelvis. The peritoneum was incised until the leftt external iliac vein was identified. Careful dissection was performed to release the nodal packet lateral to the vein. Dissection was then carried out inferior and medially to the vein until the lateral pelvic wall was encountered. Gentle dissection was then carried proximally and distally along the vein. The obturator nerve was identified early at its proximal and distal ends. Dissection of this packet was carried out making sure not to  cause injury to the obturator nerve. Hemostasis and lymph vessels were controlled with Hem-o-lok surgical clips. The lymph node packet was then removed and sent for permanent pathology.  Attention was turned to the right pelvis. The peritoneum was  incised until the right external iliac vein was identified. Careful dissection was performed to release the nodal packet lateral to the vein. Dissection was then carried out inferior and medially to the vein until the lateral pelvic wall was encountered. Gentle dissection was then carried proximally and distally along the vein. The obturator nerve was identified early at its proximal and distal ends. Dissection of this packet was carried out making sure not to cause injury to the obturator nerve. Hemostasis and lymph vessels were controlled with Hem-o-lok surgical clips. The lymph node packet was then removed and sent for permanent pathology.  After this was done, the endopelvic fascia was incised  bilaterally and carried anterior and posteriorly bilaterally. The  puboprostatic ligaments were then divided and then the stapler device  was used to divide and ligate the dorsal venous complex.   After this was complete, attention was turned back to the junction between the prostate and bladder neck.  Dissection was carried down between the prostate and the bladder until  the Foley catheter was encountered. It was deflated and then placed  through the dissection site and then anterior traction was placed by  grasping the catheter with the 4th robot arm. The remainder of the bladder neck  was then dissected out, and dissection was carried down posteriorly. The patient did not have an enlarged median lobe. A cystotomy was created on the posterior bladder in the midline while dissecting the posterior bladder neck. This was medial and posterior to the ureter orifices bilaterally. Each ureter orifice was identified and seen to be effluxing clear yellow urine.  Dissection was redirected until the bilateral seminal vesicles and vas deferens were identified.   The seminal vesicles and vas deferens were then dissected out completely bilaterally. The right seminal vesicle was noted to have desmoplastic tissue  around it concerning for spread of prostate cancer.   After these were done, they were placed on traction anteriorly and blunt  dissection was carried out between the rectum and the prostate.   Attention was turned to the right prostatic pedicle. It was evaluated and found to have no good surgical plane.  It was felt that further attempt at nerve sparing on this right side would jeopardize cancer control and a wide dissection of this side was carried out. The right prostatic pedicle was controlled with sharp dissection and Hem-o-lok clips.    Attention was turned to the left prostatic pedicle. It was evaluated and found to have no good surgical plane.  It was felt that further attempt at nerve sparing on this left side would jeopardize cancer control and a wide dissection of this side was carried out. The left prostatic pedicle was controlled with sharp dissection and Hem-o-lok clips.   Dissection was then carried up to the urethra which was all that remained attaching the prostate to the body. The urethra was  then divided with scissors and the prostate was placed to the side.   The pelvis was irrigated and flushing of air into the rectal catheter showed no air bubbles.  There was a small amount of bleeding from the pelvic floor on the right lateral position close to some muscle fibers. This was oversewn with a figure-of-eight 3-0 Vicryl suture which maintained good hemostasis.  Attention  was turned to the cystotomy. It was closed with mucosal to mucosal apposition running in one layer with a 3-0 V- lock suture. Each, through the suture I would inspect each ureter orifice to visualize clear urine effluxing. After this was done a second 3-0 V- lock suture was used to reapproximate the posterior bladder wall to the posterior urethral tissue. I was sure to place my suture in the midline posterior to where the cystotomy had been created.  I was able to visualize efflux from both ureter orifices which  were each effluxing clear yellow urine.. After this, anastomosis of the bladder neck to the urethra was carried out.   A 3-0 double armed V-lock suture was used in a running fashion to perform the anastomosis.  I was careful to ensure mucosal to mucosal approximation.  After this was done, a Foley catheter was placed through the anastomosis and into the bladder. It was irrigated with 200 cc of sterile NS and found to be water tight. It was tied down and the suture needles were removed from the body.   A Jackson-Pratt drain was placed through the port of the 4th robot arm. The specimen was placed in the EndoCatch bag. The robot was  undocked and the operating table was leveled. The EndoCatch bag was removed from the body by enlarging  the midline of the umbilicus. All ports were checked and found to be  free of bleeding. The 10 mm assistant port was closed with a suture  passer under direct visualization.   In order to repair the umbilical hernia, I used interrupted figure-of-eight 0 Vicryl sutures to incorporate the fascia on each side of the defect. It should be noted the defect was no larger than the tip of my pinky finger. Following closure there was no defect palpable. Care was taken to avoid incorporation of intra-abdominal contents into the closure. After this was done, the drain was sutured into place, and the local injection with Marcaine was performed. All wounds were irrigated with  sterile normal saline and then closed with staples.  Tegaderm and drain gauze was placed over the Jackson-Pratt drain and  Foley catheter was placed to closed drainage. The patient was placed  back in supine position. Anesthesia was reversed. He was taken to PACU  in stable condition. He will be kept overnight for evaluation.    All counts were correct at the end of the case.

## 2014-02-25 NOTE — Anesthesia Preprocedure Evaluation (Addendum)
Anesthesia Evaluation  Patient identified by MRN, date of birth, ID band Patient awake    Reviewed: Allergy & Precautions, H&P , NPO status , [name] Chart, lab work & pertinent test results  Airway Mallampati: II TM Distance: >3 FB Neck ROM: Full    Dental no notable dental hx.    Pulmonary former smoker,  breath sounds clear to auscultation  Pulmonary exam normal       Cardiovascular hypertension, Pt. on medications + CAD, + Past MI and + Cardiac Stents (2013) Rhythm:Regular Rate:Normal     Neuro/Psych negative neurological ROS  negative psych ROS   GI/Hepatic negative GI ROS, Neg liver ROS,   Endo/Other  diabetes, Type 2, Insulin Dependent  Renal/GU negative Renal ROS  negative genitourinary   Musculoskeletal negative musculoskeletal ROS (+)   Abdominal   Peds negative pediatric ROS (+)  Hematology negative hematology ROS (+)   Anesthesia Other Findings   Reproductive/Obstetrics negative OB ROS                          Anesthesia Physical Anesthesia Plan  ASA: III  Anesthesia Plan: General   Post-op Pain Management:    Induction: Intravenous  Airway Management Planned: Oral ETT  Additional Equipment:   Intra-op Plan:   Post-operative Plan: Extubation in OR  Informed Consent: I have reviewed the patients History and Physical, chart, labs and discussed the procedure including the risks, benefits and alternatives for the proposed anesthesia with the patient or authorized representative who has indicated his/her understanding and acceptance.   Dental advisory given  Plan Discussed with: CRNA  Anesthesia Plan Comments:         Anesthesia Quick Evaluation

## 2014-02-25 NOTE — Progress Notes (Signed)
Post-op check  Patient has abdominal discomfort controlled w/ meds.  He is still very drowsy. Wife is at the bedside.  Filed Vitals:   02/25/14 1600  BP: 179/95  Pulse: 108  Temp: 98 F (36.7 C)  Resp: 14   Gen: NAD CV: Regular rhythm, mild tachycardia. Chest: CTA-B Abd: soft, large belly (baseline) but no distention compared to pre-op. GU: Foley draining clear yellow to amber. JP serosanguinous.  A/P: Prostate cancer. Radical prostatectomy today.  -Ambulate tonight. -Heparin. -Incentive spirometry. -Labs in the morning.

## 2014-02-25 NOTE — H&P (Signed)
Urology History and Physical Exam  CC: Prostate cancer. Umbilical hernia.  HPI: 63 year old male presents today for prostate cancer. This was discovered based on elevated PSA of 4.14. Prostate biopsy January 2015 revealed 5 positive cores with the highest grade being 4+4 equals 8. He underwent staging which was negative with a negative bone scan, chest x-ray, and CT scan. After reviewing curative options he elected to proceed with radical robotic prostatectomy with bilateral nerve sparing to be determined at the time of surgery, and bilateral pelvic lymph node dissection. He's also been noted to have a small umbilical hernia. We discussed repairing this with primary closure at the time of closure of her surgical ports. We discussed risks and benefits, and side effects of surgical intervention as well as likelihood of achieving goals. He's been cleared by his cardiologist due to a history of coronary artery disease. He was also cleared to hold his Plavix for 5 days which has done.  PMH: Past Medical History  Diagnosis Date  . Hypertension   . Hyperlipidemia   . Coronary artery disease   . Swelling of left lower extremity     LLE; "happens often"  . Myocardial infarction 05/2012    "mild"  . Type II diabetes mellitus   . Gout   . Arthritis     PSH: Past Surgical History  Procedure Laterality Date  . Coronary angioplasty with stent placement  06/05/2012    "2; total of 2"  . Back surgery    . Lumbar disc surgery  1990's    Allergies: No Known Allergies  Medications: Prescriptions prior to admission  Medication Sig Dispense Refill  . allopurinol (ZYLOPRIM) 100 MG tablet Take 300 mg by mouth daily.       Marland Kitchen amLODipine-valsartan (EXFORGE) 10-320 MG per tablet Take 1 tablet by mouth every morning.       Marland Kitchen aspirin EC 81 MG tablet Take 81 mg by mouth daily.      Marland Kitchen atorvastatin (LIPITOR) 10 MG tablet Take 10 mg by mouth daily.      . metFORMIN (GLUCOPHAGE) 1000 MG tablet Take 1,000 mg by  mouth 2 (two) times daily with a meal.      . metoprolol tartrate (LOPRESSOR) 25 MG tablet Take 12.5 mg by mouth 2 (two) times daily.      . sitaGLIPtin (JANUVIA) 50 MG tablet Take 50 mg by mouth 2 (two) times daily.      . clopidogrel (PLAVIX) 75 MG tablet Take 75 mg by mouth daily with breakfast.      . insulin glargine (LANTUS) 100 UNIT/ML injection Inject 25 Units into the skin every morning.       . INVESTIGATIONAL DRUG SIMPLE RECORD daily. Evacetrapib (CETP inhibitor) lipid lowering study with [facility]      . nitroGLYCERIN (NITROSTAT) 0.4 MG SL tablet Place 0.4 mg under the tongue every 5 (five) minutes as needed for chest pain.         Social History: History   Social History  . Marital Status: Married    Spouse Name: N/A    Number of Children: N/A  . Years of Education: N/A   Occupational History  . Not on file.   Social History Main Topics  . Smoking status: Former Smoker -- 2.00 packs/day for 30 years    Types: Cigarettes    Quit date: 10/23/1993  . Smokeless tobacco: Never Used  . Alcohol Use: No  . Drug Use: No  . Sexual Activity: Not  Currently   Other Topics Concern  . Not on file   Social History Narrative  . No narrative on file    Family History: Family History  Problem Relation Age of Onset  . Breast cancer Mother   . Asthma Father   . Parkinson's disease Father     Review of Systems: Positive: None. Negative: Fever, SOB, or chest pain.  A further 10 point review of systems was negative except what is listed in the HPI.  Physical Exam: Filed Vitals:   02/25/14 0627  BP: 161/96  Pulse: 81  Temp: 97.7 F (36.5 C)  Resp: 18    General: No acute distress.  Awake. Head:  Normocephalic.  Atraumatic. ENT:  EOMI.  Mucous membranes moist Neck:  Supple.  No lymphadenopathy. CV:  S1 present. S2 present. Regular rate. Pulmonary: Equal effort bilaterally.  Clear to auscultation bilaterally. Abdomen: Soft.  Non- tender to  palpation. Skin:  Normal turgor.  No visible rash. Extremity: No gross deformity of bilateral upper extremities.  No gross deformity of    bilateral lower extremities. Neurologic: Alert. Appropriate mood.    Studies:  No results found for this basename: HGB, WBC, PLT,  in the last 72 hours  No results found for this basename: NA, K, CL, CO2, BUN, CREATININE, CALCIUM, MAGNESIUM, GFRNONAA, GFRAA,  in the last 72 hours   No results found for this basename: PT, INR, APTT,  in the last 72 hours   No components found with this basename: ABG,     Assessment:  Prostate cancer. Umbilical hernia.  Plan: To OR for radical robotic prostatectomy with bilateral nerve sparing to be determined at the time of surgery, and bilateral pelvic lymph node dissection, and umbilical hernia repair.

## 2014-02-25 NOTE — Transfer of Care (Signed)
Immediate Anesthesia Transfer of Care Note  Patient: Gregory [name]  Procedure(s) Performed: Procedure(s): ROBOTIC ASSISTED LAPAROSCOPIC RADICAL  PROSTATECTOMY,  UMBILICAL HERNIA REPAIR (N/A) LYMPHADENECTOMY-"PELVIC LYMPH NODE DISSECTION" (Bilateral)  Patient Location: PACU  Anesthesia Type:General  Level of Consciousness: awake and sedated  Airway & Oxygen Therapy: Patient Spontanous Breathing and Patient connected to face mask oxygen  Post-op Assessment: Report given to PACU RN, Post -op Vital signs reviewed and stable and blood pressure elevated.  Post vital signs: Reviewed and stable  Complications: No apparent anesthesia complications

## 2014-02-25 NOTE — Anesthesia Postprocedure Evaluation (Signed)
  Anesthesia Post-op Note  Patient: Gregory [name]  Procedure(s) Performed: Procedure(s) (LRB): ROBOTIC ASSISTED LAPAROSCOPIC RADICAL  PROSTATECTOMY,  UMBILICAL HERNIA REPAIR (N/A) LYMPHADENECTOMY-"PELVIC LYMPH NODE DISSECTION" (Bilateral)  Patient Location: PACU  Anesthesia Type: General  Level of Consciousness: awake and alert   Airway and Oxygen Therapy: Patient Spontanous Breathing  Post-op Pain: mild  Post-op Assessment: Post-op Vital signs reviewed, [name] Cardiovascular Status Stable, Respiratory Function Stable, Patent Airway and No signs of Nausea or vomiting  Last Vitals:  Filed Vitals:   02/25/14 1545  BP: 183/92  Pulse: 93  Temp: 36.9 C  Resp: 19    Post-op Vital Signs: stable   Complications: No apparent anesthesia complications

## 2014-02-26 ENCOUNTER — Encounter (HOSPITAL_COMMUNITY): Payer: Self-pay | Admitting: Urology

## 2014-02-26 LAB — BASIC METABOLIC PANEL
BUN: 22 mg/dL (ref 6–23)
CO2: 26 mEq/L (ref 19–32)
Calcium: 8.1 mg/dL — ABNORMAL LOW (ref 8.4–10.5)
Chloride: 104 mEq/L (ref 96–112)
Creatinine, Ser: 1.51 mg/dL — ABNORMAL HIGH (ref 0.50–1.35)
GFR calc Af Amer: 55 mL/min — ABNORMAL LOW (ref >90)
GFR calc non Af Amer: 47 mL/min — ABNORMAL LOW (ref >90)
Glucose, Bld: 149 mg/dL — ABNORMAL HIGH (ref 70–99)
Potassium: 4.5 mEq/L (ref 3.7–5.3)
Sodium: 141 mEq/L (ref 137–147)

## 2014-02-26 LAB — CREATININE, FLUID (PLEURAL, PERITONEAL, JP DRAINAGE): Creat, Fluid: 1.7 mg/dL

## 2014-02-26 LAB — HEMOGLOBIN AND HEMATOCRIT, BLOOD
HCT: 38.9 % — ABNORMAL LOW (ref 39.0–52.0)
Hemoglobin: 12.7 g/dL — ABNORMAL LOW (ref 13.0–17.0)

## 2014-02-26 LAB — GLUCOSE, CAPILLARY
Glucose-Capillary: 138 mg/dL — ABNORMAL HIGH (ref 70–99)
Glucose-Capillary: 160 mg/dL — ABNORMAL HIGH (ref 70–99)
Glucose-Capillary: 126 mg/dL — ABNORMAL HIGH (ref 70–99)
Glucose-Capillary: 134 mg/dL — ABNORMAL HIGH (ref 70–99)

## 2014-02-26 MED ORDER — BACITRACIN-NEOMYCIN-POLYMYXIN 400-5-5000 EX OINT: 1.0000 "application " | TOPICAL_OINTMENT | Freq: Three times a day (TID) | CUTANEOUS | Status: DC | PRN

## 2014-02-26 MED ORDER — HYOSCYAMINE SULFATE 0.125 MG PO TABS: 0.1250 mg | ORAL_TABLET | ORAL | Status: DC | PRN

## 2014-02-26 MED ORDER — OXYCODONE-ACETAMINOPHEN 5-325 MG PO TABS: 1.0000 | ORAL_TABLET | ORAL | Status: DC | PRN

## 2014-02-26 MED ORDER — SENNOSIDES-DOCUSATE SODIUM 8.6-50 MG PO TABS: 1.0000 | ORAL_TABLET | Freq: Two times a day (BID) | ORAL | Status: DC

## 2014-02-26 MED ORDER — CEPHALEXIN 500 MG PO CAPS: 500.0000 mg | ORAL_CAPSULE | Freq: Three times a day (TID) | ORAL | Status: DC

## 2014-02-26 MED ORDER — SODIUM CHLORIDE 0.9 % IV SOLN
INTRAVENOUS | Status: DC
  Administered 2014-02-26 – 2014-02-27 (×3): via INTRAVENOUS

## 2014-02-26 MED ORDER — OXYBUTYNIN CHLORIDE ER 10 MG PO TB24: 10.0000 mg | ORAL_TABLET | Freq: Every day | ORAL | Status: DC

## 2014-02-26 MED ORDER — BISACODYL 10 MG RE SUPP: 10.0000 mg | Freq: Two times a day (BID) | RECTAL | Status: DC

## 2014-02-26 NOTE — Progress Notes (Signed)
Positive flatus. No BM. Pain controlled. Ambulating.   A little bloated.   JP output not abnormally high, especially after RPLND, but will send JP fluid for creatinine since he had a cystotomy w/ cystorrhaphy.  Will keep overnight for further IV fluid hydration.  NS at 150 cc/hr. Morning labs.

## 2014-02-26 NOTE — Progress Notes (Signed)
PHARMACIST - PHYSICIAN COMMUNICATION DR:  Jasmine December CONCERNING:  METFORMIN SAFE ADMINISTRATION POLICY  RECOMMENDATION: Metformin has been placed on DISCONTINUE (rejected order) STATUS and should be reordered only after any of the conditions below are ruled out.  Current safety recommendations include avoiding metformin for a minimum of 48 hours after the patient's exposure to intravenous contrast media.  DESCRIPTION:  The Pharmacy Committee has adopted a policy that restricts the use of metformin in hospitalized patients until all the contraindications to administration have been ruled out. Specific contraindications are: [x]  Serum creatinine ? 1.5 for males []  Serum creatinine ? 1.4 for females []  Shock, acute MI, sepsis, hypoxemia, dehydration []  Planned administration of intravenous iodinated contrast media []  Heart Failure patients with low EF []  Acute or chronic metabolic acidosis (including DKA)       Doreene Eland, PharmD, BCPS.   Pager: [phone] 1:21 PM 02/26/2014

## 2014-02-26 NOTE — Progress Notes (Signed)
Urology Progress Note  Subjective:     No acute urologic events overnight. Ambulated last night without problems. Participating in incentive spirometry. Does not have much abdominal pain; what is present is well controlled. Negative nausea. Negative BM or flatus.  ROS: Negative: chest pain or SOB.  Objective:  Patient Vitals for the past 24 hrs:  BP Temp Temp src Pulse Resp SpO2 Height Weight  02/26/14 0640 141/68 mmHg 99 F (37.2 C) Oral 93 18 94 % - -  02/26/14 0155 134/77 mmHg 99 F (37.2 C) Oral 87 18 95 % - -  02/25/14 2220 153/86 mmHg 99 F (37.2 C) Oral 105 18 95 % - -  02/25/14 1600 179/95 mmHg 98 F (36.7 C) - 108 14 91 % - -  02/25/14 1545 183/92 mmHg 98.5 F (36.9 C) - 93 19 97 % - -  02/25/14 1530 169/87 mmHg - - 92 20 98 % - -  02/25/14 1515 175/86 mmHg - - 89 19 98 % - -  02/25/14 1500 180/89 mmHg - - 92 19 96 % - -  02/25/14 1449 194/99 mmHg 98.5 F (36.9 C) - 105 16 98 % - -  02/25/14 6294 - - - - - - 5\' 8"  (1.727 m) 105.688 kg (233 lb)    Physical Exam: General:  No acute distress, awake Cardiovascular:    [x]   S1/S2 present, RRR  []   Irregularly irregular Chest:  CTA-B Abdomen:               []  Soft, appropriately TTP  []  Soft, NTTP  [x]  Soft, appropriately TTP, incision(s) clean/dry/intact, JP serosanguinous.  Genitourinary: Foley with clear yellow urine. Negative edema.     I/O last 3 completed shifts: In: 7654 [P.O.:120; I.V.:5265; IV Piggyback:50] Out: 1860 [Urine:1550; Drains:160; Blood:150]  Recent Labs     02/25/14  1656  02/26/14  0305  HGB  14.1  12.7*  WBC  14.1*   --   PLT  164   --     Recent Labs     02/25/14  1656  02/26/14  0305  NA   --   141  K   --   4.5  CL   --   104  CO2   --   26  BUN   --   22  CREATININE  1.43*  1.51*  CALCIUM   --   8.1*  GFRNONAA  51*  47*  GFRAA  59*  55*     No results found for this basename: PT, INR, APTT,  in the last 72 hours   No components found with this basename: ABG,      Length of stay: 1 days.  Assessment: Prostate cancer. POD#1 Robotic radical prostatectomy. Cystorrhaphy. Umbilical hernia repair.    Plan: -Saline lock IV. -Ambulate. -Regular diet. -Foley cath teaching. -We discussed the course of the surgery and the surgical findings.  -We reviewed discharge meds and instructions. -Likely d/c home today. JP will come out before discharge.   Rolan Bucco, MD [phone]

## 2014-02-26 NOTE — Discharge Instructions (Signed)
DISCHARGE INSTRUCTIONS FOR RADICAL PROSTATECTOMY  MEDICATIONS:   1. Resume all your other meds from home.  2. Restart your plavix on Satruday, 02/28/14.  ACTIVITY 1. No heavy lifting >10 pounds for 6 weeks 2. No sexual activity for 6 weeks 3. No strenuous activity for 6 weeks 4. No driving while on narcotic pain medications 5. Drink plenty of water 6. Continue to walk at home - you can still get blood clots when you are at  home, so keep active, but don't over do it. 7. You may resume normal activity in 2 wks (working but no heavy lifting) 8. DO NOT STRAIN TO HAVE A BOWEL MOVEMENT.  DIET 1. Go slow with your diet. You had a big surgery and your appetite will not be  as it was before surgery for quite some time. You may also notice a metallic  taste in your mouth, this should go away in a few weeks. Make sure you stay well  hydrated and drink lots of water. You can buy Boost or Ensure supplements to  drink between meals and any other time as well to make sure you are getting  adequate nutrition. 2. Do not eat a lot of heavy meals (ex hamburgers, steak) right away - you may  get sick to your stomach. 3. Do not strain to have a bowel movement.  If you have not been able to have a  BM after several day, go to your local pharmacy and obtain Mild of Magnesia and  take it as instructed on the bottle.   BATHING/WOUND CARE 1. You can shower and we recommend daily showers.  If you have steri strips over your  incision will fall off on their own in a week or so.  If you have staples, they will be removed when your catheter is removed.  2. The JP site will close up on its own in a few days - replace the 4x4 gauze if  it becomes saturated. 3. You do not need to dress your surgical wounds.  Let the steri-strips fall off  on their own. 4. DO NOT SUBMERGE YOUR CATHETER OR WOUND UNDER WATER.  CATHETER 1. Keep your catheter secured to your leg at all times with tape. 2. You may experience  leakage of urine around your catheter- as long as the  catheter continues to drain, this is normal.  If your catheter stops draining  return to the ER, but do not let anyone other than a urologist replace your  catheter. 3. You may also have blood in your urine, even after it has been clear for  several days; you may even pass some small blood clots or other material.  This  is normal as well.  If this happens, sit down and drink plenty of water to help  make urine to flush out your bladder.  If the blood in your urine becomes worse  after doing this, contact our office or return to the ER. 4. You may use the leg bag (small bag) during the day, but use the large bag at  night.  [place] (if you go home with a JP drain: 1. Keep the drain pinned to your shirt (not your pants because you will forget and pull the drain out when you pull your pants down). 2. Record the output of the drain and keep a total for each 24 hours. Once the total output is less than 50 mL per day, then call clinic to make an  appointment to have the drain removed. 3. You may clean around the drain with a damp wash cloth, but make sure you dry it well afterwards. You may place gauze around the drain if there is leakage around the drain. Change the gauze at least once daily. 4. Do not submerge the drain underwater in a bath. Cover the drain site if you take a shower and uncover it after the shower.   SIGNS/SYMPTOMS TO CALL: 1. Please call us if you have a fever greater than 101.5, uncontrolled  nausea/vomiting, uncontrolled pain, dizziness, unable to urinate,   chest pain, shortness of breath, leg swelling, leg pain, or any other concerns  or questions.  You can reach [place] at [phone].

## 2014-02-26 NOTE — Progress Notes (Signed)
Came to visit patient at bedside on behalf of Link to [employer] program for [employer] employees/dependents with [employer]. He is active with the Link to Wellness program for DM management with the PharmD. Patient agreeable to post hospital discharge call. Confirmed contact information. Appreciative of visit.  [facility] [phone]

## 2014-02-27 LAB — GLUCOSE, CAPILLARY
Glucose-Capillary: 132 mg/dL — ABNORMAL HIGH (ref 70–99)
Glucose-Capillary: 137 mg/dL — ABNORMAL HIGH (ref 70–99)

## 2014-02-27 LAB — BASIC METABOLIC PANEL
BUN: 29 mg/dL — ABNORMAL HIGH (ref 6–23)
CO2: 26 mEq/L (ref 19–32)
Calcium: 8.4 mg/dL (ref 8.4–10.5)
Chloride: 103 mEq/L (ref 96–112)
Creatinine, Ser: 1.71 mg/dL — ABNORMAL HIGH (ref 0.50–1.35)
GFR calc Af Amer: 47 mL/min — ABNORMAL LOW (ref >90)
GFR calc non Af Amer: 41 mL/min — ABNORMAL LOW (ref >90)
Glucose, Bld: 124 mg/dL — ABNORMAL HIGH (ref 70–99)
Potassium: 4.4 mEq/L (ref 3.7–5.3)
Sodium: 140 mEq/L (ref 137–147)

## 2014-02-27 NOTE — Progress Notes (Signed)
Pt left at this time with his spouse at his side. Alert, oriented, and without c/o. Discharge instructions/prescriptions given/explained with pt/spouse verbalizing understanding.  Leg bag teaching done and all supplies sent home with pt. Followup appointment noted.

## 2014-02-27 NOTE — Discharge Summary (Signed)
Physician Discharge Summary  Patient ID: Gregory [name] MRN: [id number] DOB/AGE: [birth year] 63 y.o.  Admit date: 02/25/2014 Discharge date: 02/27/2014  Admission Diagnoses:  Discharge Diagnoses:  Active Problems:   Malignant neoplasm of prostate   Discharged Condition: good  Hospital Course:  63 year old male presents to the hospital for robotic radical prostatectomy Geeta Gleason 4+4 equals 8 prostate cancer.  He had radical robotic prostatectomy with repair of small umbilical hernia.  He did well postoperatively and was able to ambulate early and tolerate a regular diet.  Pain was well-controlled.  JP drain output was appropriate, but due to a cystotomy during the surgery the fluid was checked for creatinine.  This returned consistent with serum.  JP drain was removed.  He was taught Foley catheter care.  He was able to be discharged home.  Consults: None  Significant Diagnostic Studies: None.  Treatments: IV hydration and surgery: Robotic radical prostatectomy with bilateral pelvic lymph node dissection and umbilical hernia repair.  Discharge Exam: Blood pressure 121/70, pulse 89, temperature 98.9 F (37.2 C), temperature source Oral, resp. rate 18, height 5\' 8"  (1.727 m), weight 105.688 kg (233 lb), SpO2 96.00%. Refer to PE from progress note from day of discharge.  Disposition: 01-Home or Self Care  Discharge Orders   Future Orders Complete By Expires   Discharge patient  As directed        Medication List    STOP taking these medications       clopidogrel 75 MG tablet  Commonly known as:  PLAVIX      TAKE these medications       allopurinol 100 MG tablet  Commonly known as:  ZYLOPRIM  Take 300 mg by mouth daily.     amLODipine-valsartan 10-320 MG per tablet  Commonly known as:  EXFORGE  Take 1 tablet by mouth every morning.     aspirin EC 81 MG tablet  Take 81 mg by mouth daily.     atorvastatin 10 MG tablet  Commonly known as:  LIPITOR  Take 10 mg by  mouth daily.     bisacodyl 10 MG suppository  Commonly known as:  DULCOLAX  Place 1 suppository (10 mg total) rectally 2 (two) times daily.     cephALEXin 500 MG capsule  Commonly known as:  KEFLEX  Take 1 capsule (500 mg total) by mouth 3 (three) times daily. Begin the day before your follow up appointment.     hyoscyamine 0.125 MG tablet  Commonly known as:  LEVSIN, ANASPAZ  Take 1 tablet (0.125 mg total) by mouth every 4 (four) hours as needed (bladder spasms).     insulin glargine 100 UNIT/ML injection  Commonly known as:  LANTUS  Inject 25 Units into the skin every morning.     metFORMIN 1000 MG tablet  Commonly known as:  GLUCOPHAGE  Take 1,000 mg by mouth 2 (two) times daily with a meal.     metoprolol tartrate 25 MG tablet  Commonly known as:  LOPRESSOR  Take 12.5 mg by mouth 2 (two) times daily.     neomycin-bacitracin-polymyxin ointment  Commonly known as:  NEOSPORIN  Apply 1 application topically 3 (three) times daily as needed for wound care (catheter irritation). apply to tip of penis.     nitroGLYCERIN 0.4 MG SL tablet  Commonly known as:  NITROSTAT  Place 0.4 mg under the tongue every 5 (five) minutes as needed for chest pain.     oxybutynin 10 MG 24 hr tablet  Commonly known as:  DITROPAN-XL  Take 1 tablet (10 mg total) by mouth at bedtime.     oxyCODONE-acetaminophen 5-325 MG per tablet  Commonly known as:  PERCOCET  Take 1-2 tablets by mouth every 4 (four) hours as needed.     research study medication  daily. Evacetrapib (CETP inhibitor) lipid lowering study with [employer]     senna-docusate 8.6-50 MG per tablet  Commonly known as:  Senokot-S  Take 1 tablet by mouth 2 (two) times daily.     sitaGLIPtin 50 MG tablet  Commonly known as:  JANUVIA  Take 50 mg by mouth 2 (two) times daily.           Follow-up Information   Follow up with Molli Hazard, MD. (Call to schedule a new appointment- office aware that date needs to be  changed.)    Specialty:  Urology   Contact information:   [facility]  [place] [id number] [phone]       Signed: Molli Hazard 02/27/2014, 6:56 AM

## 2014-02-27 NOTE — Progress Notes (Signed)
Urology Progress Note  Subjective:     No acute urologic events overnight. Ambulating. Positive flatus; no BM. Tolerating regular diet. No nausea. Pain controlled.    ROS: Negative: chest pain or SOB.  Objective:  Patient Vitals for the past 24 hrs:  BP Temp Temp src Pulse Resp SpO2  02/27/14 0430 121/70 mmHg 98.9 F (37.2 C) Oral 89 18 96 %  02/26/14 2054 136/66 mmHg 99.7 F (37.6 C) Oral 102 18 92 %  02/26/14 1343 117/61 mmHg 99.1 F (37.3 C) Oral 86 16 95 %  02/26/14 0927 150/62 mmHg - - 88 - -    Physical Exam: General:  No acute distress, awake Cardiovascular:    [x]   S1/S2 present, RRR  []   Irregularly irregular Chest:  CTA-B Abdomen:               []  Soft, appropriately TTP  []  Soft, NTTP  [x]  Soft, appropriately TTP, incision(s) clean/dry/intact, JP serosanguinous.  Genitourinary: Foley with clear yellow urine. Negative edema.     I/O last 3 completed shifts: In: 6275 [P.O.:960; I.V.:5265; IV Piggyback:50] Out: 2810 [Urine:2300; Drains:360; Blood:150]  Recent Labs     02/25/14  1656  02/26/14  0305  HGB  14.1  12.7*  WBC  14.1*   --   PLT  164   --     Recent Labs     02/26/14  0305  02/27/14  0338  NA  141  140  K  4.5  4.4  CL  104  103  CO2  26  26  BUN  22  29*  CREATININE  1.51*  1.71*  CALCIUM  8.1*  8.4  GFRNONAA  47*  41*  GFRAA  55*  47*     No results found for this basename: PT, INR, APTT,  in the last 72 hours   No components found with this basename: ABG,     Length of stay: 2 days.  Assessment: Prostate cancer. POD#2 Robotic radical prostatectomy. Cystorrhaphy. Umbilical hernia repair.    Plan: -D/c JP. -Teach catheter care. -D/c home.   Rolan Bucco, MD [phone]

## 2014-05-11 ENCOUNTER — Ambulatory Visit (INDEPENDENT_AMBULATORY_CARE_PROVIDER_SITE_OTHER): Payer: 59 | Admitting: Family Medicine

## 2014-05-11 VITALS — BP 152/82 | HR 88 | Wt 213.2 lb

## 2014-05-11 DIAGNOSIS — E119 Type 2 diabetes mellitus without complications: Secondary | ICD-10-CM

## 2014-05-11 DIAGNOSIS — E1165 Type 2 diabetes mellitus with hyperglycemia: Secondary | ICD-10-CM

## 2014-05-11 NOTE — Progress Notes (Signed)
Subjective:  Patient presents today for 3 month diabetes follow-up as part of the employer-sponsored Link to Wellness program. Current diabetes regimen includes Lantus 25 units SQ once daily, Januvia 50 mg BID, metformin 1000 mg BID. Patient also continues on daily ASA and ARB. Patient had surgery in May to remove his prostate due to Prostate cancer (grade 8). He states that he is waiting for the drainage to stop before his doctor makes any decision on further treatment. Patient states that the drainage is clear to yellow. It is not cloudy or milky. Patient denies any pain with the drainage. He states that the drianage has tapered off some, but is still there. He states that he has the drainage in little bits throughout the day. He is wearing a pad to help with the leakage. No changes to medications.   Disease Assessments:  Diabetes: Type of Diabetes: Type 2; Sees Diabetes provider 4 or more times per year; Diabetes Education Comprehensive group @ [facility]; MD managing Diabetes Sew[place] Carol; Year of diagnosis 2010; takes medications as prescribed; checks feet daily; takes an aspirin a day; hypoglycemia frequency never; checks blood glucose less than 1 time a week;   Highest CBG 140s; Lowest CBG 80; does not use glucometer; Other Diabetes History:  Patient denies hypoglycemia. Patient states that he had labwork drawn last week at Dr. Lina Sar office. He has not heard the results yet. I will fax to there office to get the result. Patient has not been checking his blood sugar. He states that he did check it this morning and it was 98. CBG averages are self reported. He did not bring his meter to this appointment. he states that he does not miss taking Lantus, but he is almost 50 days past due for a refill. He states he still has a few pens in the fridge.     Physical Activity- He went back to work this last week at [employer]. He is working with [employer]. He states that he is limited somewhat with  physical activity because of the drainage related to his prostectomy. He is working with a physical therapist with the Urology office to help strengthen his pelvic floor muscles.  Nutrition-  Patient has lost 17 pounds since his last appointment with me.  He states that he has stopped eating at [place] in the morning. He states that he isn't eating as much breakfast. Sometimes he is eating raisin bran.  L- often times he isn't eating lunch. Sometimes he is eating chips.  D- He is eating some fast food. 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 He will bring some fruit with him from home to eat also.    Vital Signs:  05/11/2014 4:51 PM (EST)Blood Pressure 152 / 82 mm/Hg; Pulse Rate 88 bpm; Weight 213.2 lbs     Assessment/Plan: Patient is a 63 year old male with DM2. I do not have his most recent A1C because I have not heard back from Dr. Lina Sar office yet (patient had A1C checked last week). However, given patient's recent 17 pound weight loss, his A1C is likely either lower than his last result of 6.7% or unchanged. He is not checking his blood sugar regularly. I also question whether or not he is taking Lantus appropriately. He states that he doesn't miss a dose but he is a few months late obtaining a refill. Patient reiterated that he does take Lantus every day. One of his goals from last time is to  start checking CBG every morning. He has not done this but is willing to make this a goal for the next visit. Patient is seeing a physical therapist at [facility] and states that he sometimes does the exercises. He continues to experience drainage daily and is wearing Depends pads/underwear. He states that physical activity is hard because of the plastic covering of the Depends (he gets very sweaty). I encouraged him to be diligent in completing his exercises given to him from the PT. He states that it is hard to find the time to do the exercises. 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 I will follow up with patient in 3 months. If I have not heard back from Dr. Lina Sar office by next visit, complete A1C at that time. .      Goals for Next Visit- 1. Try to do your physical therapy exercises every day. There are a lot of exercises you can do while you are at the [facility]. 2. Get to the [facility] once a week. Aim to go on Mondays when you don't go in to the barbershop. Ideas while you are there- walking around the track and using the recumbent bicycle. 3. Try to eat something for breakfast. Next appointment to see me is Monday October 19th at 10 AM.

## 2014-05-25 ENCOUNTER — Encounter: Payer: Self-pay | Admitting: Family Medicine

## 2014-05-25 NOTE — Progress Notes (Signed)
Patient ID: Gregory [name], male   DOB: Oct 14, 1951, 63 y.o.   MRN: [id number] Reviewed: Agree with our Pharmacologist's documentation and management.

## 2014-05-25 NOTE — Progress Notes (Signed)
Patient ID: Gregory [name], male   DOB: 20-Jul-1951, 63 y.o.   MRN: [id number] Reviewed: Agree with our Pharmacologist's documentation and management.

## 2014-05-26 NOTE — Progress Notes (Signed)
Patient ID: Gregory [name], male   DOB: [birth year], 63 y.o.   MRN: [id number] Reviewed: Agree with our pharmacologist's documentation and management.

## 2014-06-08 ENCOUNTER — Encounter: Payer: Self-pay | Admitting: Family Medicine

## 2014-08-22 ENCOUNTER — Ambulatory Visit (INDEPENDENT_AMBULATORY_CARE_PROVIDER_SITE_OTHER): Payer: 59 | Admitting: Emergency Medicine

## 2014-08-22 ENCOUNTER — Encounter (HOSPITAL_COMMUNITY): Payer: Self-pay | Admitting: Emergency Medicine

## 2014-08-22 ENCOUNTER — Inpatient Hospital Stay (HOSPITAL_COMMUNITY)
Admission: EM | Admit: 2014-08-22 | Discharge: 2014-08-25 | DRG: 603 | Disposition: A | Discharge status: Home / Self Care | Payer: 59 | Attending: Internal Medicine | Admitting: Internal Medicine

## 2014-08-22 VITALS — BP 124/70 | HR 100 | Temp 98.1°F | Resp 18 | Ht 68.0 in | Wt 220.0 lb

## 2014-08-22 DIAGNOSIS — I2510 Atherosclerotic heart disease of native coronary artery without angina pectoris: Secondary | ICD-10-CM | POA: Diagnosis present

## 2014-08-22 DIAGNOSIS — I252 Old myocardial infarction: Secondary | ICD-10-CM

## 2014-08-22 DIAGNOSIS — E1165 Type 2 diabetes mellitus with hyperglycemia: Secondary | ICD-10-CM | POA: Diagnosis present

## 2014-08-22 DIAGNOSIS — E1122 Type 2 diabetes mellitus with diabetic chronic kidney disease: Secondary | ICD-10-CM | POA: Diagnosis present

## 2014-08-22 DIAGNOSIS — Z87891 Personal history of nicotine dependence: Secondary | ICD-10-CM | POA: Diagnosis not present

## 2014-08-22 DIAGNOSIS — N183 Chronic kidney disease, stage 3 unspecified: Secondary | ICD-10-CM

## 2014-08-22 DIAGNOSIS — I10 Essential (primary) hypertension: Secondary | ICD-10-CM | POA: Diagnosis present

## 2014-08-22 DIAGNOSIS — E785 Hyperlipidemia, unspecified: Secondary | ICD-10-CM | POA: Diagnosis present

## 2014-08-22 DIAGNOSIS — M1000 Idiopathic gout, unspecified site: Secondary | ICD-10-CM | POA: Diagnosis present

## 2014-08-22 DIAGNOSIS — Z7982 Long term (current) use of aspirin: Secondary | ICD-10-CM | POA: Diagnosis not present

## 2014-08-22 DIAGNOSIS — IMO0002 Reserved for concepts with insufficient information to code with codable children: Secondary | ICD-10-CM | POA: Diagnosis present

## 2014-08-22 DIAGNOSIS — I891 Lymphangitis: Secondary | ICD-10-CM

## 2014-08-22 DIAGNOSIS — C61 Malignant neoplasm of prostate: Secondary | ICD-10-CM | POA: Diagnosis present

## 2014-08-22 DIAGNOSIS — Z9079 Acquired absence of other genital organ(s): Secondary | ICD-10-CM | POA: Diagnosis present

## 2014-08-22 DIAGNOSIS — I129 Hypertensive chronic kidney disease with stage 1 through stage 4 chronic kidney disease, or unspecified chronic kidney disease: Secondary | ICD-10-CM | POA: Diagnosis present

## 2014-08-22 DIAGNOSIS — Z794 Long term (current) use of insulin: Secondary | ICD-10-CM

## 2014-08-22 DIAGNOSIS — L03114 Cellulitis of left upper limb: Principal | ICD-10-CM | POA: Insufficient documentation

## 2014-08-22 DIAGNOSIS — Z79899 Other long term (current) drug therapy: Secondary | ICD-10-CM

## 2014-08-22 DIAGNOSIS — Z955 Presence of coronary angioplasty implant and graft: Secondary | ICD-10-CM | POA: Diagnosis not present

## 2014-08-22 DIAGNOSIS — N1831 Chronic kidney disease, stage 3a: Secondary | ICD-10-CM | POA: Diagnosis present

## 2014-08-22 DIAGNOSIS — L0390 Cellulitis, unspecified: Secondary | ICD-10-CM | POA: Diagnosis present

## 2014-08-22 LAB — BASIC METABOLIC PANEL
Anion gap: 14 (ref 5–15)
BUN: 29 mg/dL — ABNORMAL HIGH (ref 6–23)
CO2: 23 mEq/L (ref 19–32)
Calcium: 9 mg/dL (ref 8.4–10.5)
Chloride: 100 mEq/L (ref 96–112)
Creatinine, Ser: 1.4 mg/dL — ABNORMAL HIGH (ref 0.50–1.35)
GFR calc Af Amer: 60 mL/min — ABNORMAL LOW (ref >90)
GFR calc non Af Amer: 52 mL/min — ABNORMAL LOW (ref >90)
Glucose, Bld: 211 mg/dL — ABNORMAL HIGH (ref 70–99)
Potassium: 4.3 mEq/L (ref 3.7–5.3)
Sodium: 137 mEq/L (ref 137–147)

## 2014-08-22 LAB — POCT CBC
Granulocyte percent: 92.5 %G — AB (ref 37–80)
HCT, POC: 43.6 % (ref 43.5–53.7)
Hemoglobin: 14.1 g/dL (ref 14.1–18.1)
Lymph, poc: 1.1 (ref 0.6–3.4)
MCH, POC: 29.4 pg (ref 27–31.2)
MCHC: 32.2 g/dL (ref 31.8–35.4)
MCV: 91.3 fL (ref 80–97)
MID (cbc): 0.6 (ref 0–0.9)
MPV: 6.8 fL (ref 0–99.8)
POC Granulocyte: 20.4 — AB (ref 2–6.9)
POC LYMPH PERCENT: 4.8 %L — AB (ref 10–50)
POC MID %: 2.7 %M (ref 0–12)
Platelet Count, POC: 209 10*3/uL (ref 142–424)
RBC: 4.78 M/uL (ref 4.69–6.13)
RDW, POC: 14.7 %
WBC: 22 10*3/uL — AB (ref 4.6–10.2)

## 2014-08-22 LAB — CBC WITH DIFFERENTIAL/PLATELET
Basophils Absolute: 0 10*3/uL (ref 0.0–0.1)
Basophils Relative: 0 % (ref 0–1)
Eosinophils Absolute: 0 10*3/uL (ref 0.0–0.7)
Eosinophils Relative: 0 % (ref 0–5)
HCT: 41.2 % (ref 39.0–52.0)
Hemoglobin: 13.9 g/dL (ref 13.0–17.0)
Lymphocytes Relative: 4 % — ABNORMAL LOW (ref 12–46)
Lymphs Abs: 0.7 10*3/uL (ref 0.7–4.0)
MCH: 29.8 pg (ref 26.0–34.0)
MCHC: 33.7 g/dL (ref 30.0–36.0)
MCV: 88.4 fL (ref 78.0–100.0)
Monocytes Absolute: 0.6 10*3/uL (ref 0.1–1.0)
Monocytes Relative: 3 % (ref 3–12)
Neutro Abs: 17.3 10*3/uL — ABNORMAL HIGH (ref 1.7–7.7)
Neutrophils Relative %: 93 % — ABNORMAL HIGH (ref 43–77)
Platelets: 184 10*3/uL (ref 150–400)
RBC: 4.66 MIL/uL (ref 4.22–5.81)
RDW: 14 % (ref 11.5–15.5)
WBC: 18.6 10*3/uL — ABNORMAL HIGH (ref 4.0–10.5)

## 2014-08-22 LAB — I-STAT CG4 LACTIC ACID, ED: Lactic Acid, Venous: 1.7 mmol/L (ref 0.5–2.2)

## 2014-08-22 LAB — GLUCOSE, POCT (MANUAL RESULT ENTRY): POC Glucose: 241 mg/dl — AB (ref 70–99)

## 2014-08-22 LAB — CBG MONITORING, ED: Glucose-Capillary: 131 mg/dL — ABNORMAL HIGH (ref 70–99)

## 2014-08-22 MED ORDER — ACETAMINOPHEN 325 MG PO TABS: 650.0000 mg | ORAL_TABLET | Freq: Four times a day (QID) | ORAL | Status: DC | PRN

## 2014-08-22 MED ORDER — IRBESARTAN 300 MG PO TABS
300.0000 mg | ORAL_TABLET | Freq: Every day | ORAL | Status: DC
  Administered 2014-08-23 – 2014-08-24 (×2): 300 mg via ORAL
  Filled 2014-08-22 (×3): qty 1

## 2014-08-22 MED ORDER — LINAGLIPTIN 5 MG PO TABS
5.0000 mg | ORAL_TABLET | Freq: Every day | ORAL | Status: DC
  Administered 2014-08-23 – 2014-08-24 (×2): 5 mg via ORAL
  Filled 2014-08-22 (×4): qty 1

## 2014-08-22 MED ORDER — METOPROLOL TARTRATE 12.5 MG HALF TABLET
12.5000 mg | ORAL_TABLET | Freq: Two times a day (BID) | ORAL | Status: DC
  Administered 2014-08-22 – 2014-08-24 (×5): 12.5 mg via ORAL
  Filled 2014-08-22 (×7): qty 1

## 2014-08-22 MED ORDER — ASPIRIN EC 81 MG PO TBEC
81.0000 mg | DELAYED_RELEASE_TABLET | Freq: Every day | ORAL | Status: DC
  Administered 2014-08-23 – 2014-08-24 (×2): 81 mg via ORAL
  Filled 2014-08-22 (×3): qty 1

## 2014-08-22 MED ORDER — AMLODIPINE BESYLATE-VALSARTAN 10-320 MG PO TABS: 1.0000 | ORAL_TABLET | Freq: Every morning | ORAL | Status: DC

## 2014-08-22 MED ORDER — SODIUM CHLORIDE 0.9 % IV SOLN
1500.0000 mg | INTRAVENOUS | Status: DC
  Administered 2014-08-23 – 2014-08-24 (×2): 1500 mg via INTRAVENOUS
  Filled 2014-08-22 (×2): qty 1500

## 2014-08-22 MED ORDER — SODIUM CHLORIDE 0.9 % IV SOLN
INTRAVENOUS | Status: AC
  Administered 2014-08-22: 23:00:00 via INTRAVENOUS
  Administered 2014-08-23: 50 mL/h via INTRAVENOUS
  Administered 2014-08-23: 06:00:00 via INTRAVENOUS

## 2014-08-22 MED ORDER — PIPERACILLIN-TAZOBACTAM 3.375 G IVPB
3.3750 g | Freq: Three times a day (TID) | INTRAVENOUS | Status: DC
  Administered 2014-08-22 – 2014-08-24 (×6): 3.375 g via INTRAVENOUS
  Filled 2014-08-22 (×7): qty 50

## 2014-08-22 MED ORDER — INSULIN ASPART 100 UNIT/ML ~~LOC~~ SOLN
0.0000 [IU] | Freq: Three times a day (TID) | SUBCUTANEOUS | Status: DC
  Administered 2014-08-23: 1 [IU] via SUBCUTANEOUS
  Administered 2014-08-24: 2 [IU] via SUBCUTANEOUS
  Administered 2014-08-24: 1 [IU] via SUBCUTANEOUS

## 2014-08-22 MED ORDER — ATORVASTATIN CALCIUM 10 MG PO TABS
10.0000 mg | ORAL_TABLET | Freq: Every day | ORAL | Status: DC
  Administered 2014-08-23 – 2014-08-24 (×2): 10 mg via ORAL
  Filled 2014-08-22 (×3): qty 1

## 2014-08-22 MED ORDER — ACETAMINOPHEN 650 MG RE SUPP
650.0000 mg | Freq: Four times a day (QID) | RECTAL | Status: DC | PRN
Start: 2014-08-22 — End: 2014-08-25

## 2014-08-22 MED ORDER — ONDANSETRON HCL 4 MG/2ML IJ SOLN: 4.0000 mg | Freq: Four times a day (QID) | INTRAMUSCULAR | Status: DC | PRN

## 2014-08-22 MED ORDER — PNEUMOCOCCAL VAC POLYVALENT 25 MCG/0.5ML IJ INJ
0.5000 mL | INJECTION | INTRAMUSCULAR | Status: DC
  Filled 2014-08-22 (×3): qty 0.5

## 2014-08-22 MED ORDER — MORPHINE SULFATE 2 MG/ML IJ SOLN
1.0000 mg | INTRAMUSCULAR | Status: DC | PRN
  Administered 2014-08-23: 1 mg via INTRAVENOUS
  Filled 2014-08-22: qty 1

## 2014-08-22 MED ORDER — NITROGLYCERIN 0.4 MG SL SUBL: 0.4000 mg | SUBLINGUAL_TABLET | SUBLINGUAL | Status: DC | PRN

## 2014-08-22 MED ORDER — INSULIN GLARGINE 100 UNIT/ML ~~LOC~~ SOLN
25.0000 [IU] | Freq: Every morning | SUBCUTANEOUS | Status: DC
  Administered 2014-08-23 – 2014-08-24 (×2): 25 [IU] via SUBCUTANEOUS
  Filled 2014-08-22 (×3): qty 0.25

## 2014-08-22 MED ORDER — VANCOMYCIN HCL 10 G IV SOLR
2000.0000 mg | Freq: Once | INTRAVENOUS | Status: AC
  Administered 2014-08-22: 2000 mg via INTRAVENOUS
  Filled 2014-08-22: qty 2000

## 2014-08-22 MED ORDER — ONDANSETRON HCL 4 MG PO TABS: 4.0000 mg | ORAL_TABLET | Freq: Four times a day (QID) | ORAL | Status: DC | PRN

## 2014-08-22 MED ORDER — AMLODIPINE BESYLATE 10 MG PO TABS
10.0000 mg | ORAL_TABLET | Freq: Every day | ORAL | Status: DC
  Administered 2014-08-23 – 2014-08-24 (×2): 10 mg via ORAL
  Filled 2014-08-22 (×3): qty 1

## 2014-08-22 MED ORDER — ALLOPURINOL 100 MG PO TABS
100.0000 mg | ORAL_TABLET | Freq: Every day | ORAL | Status: DC
  Administered 2014-08-23 – 2014-08-24 (×2): 100 mg via ORAL
  Filled 2014-08-22 (×3): qty 1

## 2014-08-22 NOTE — ED Notes (Signed)
Pt's wife cell number [phone] home [phone]

## 2014-08-22 NOTE — ED Provider Notes (Signed)
CSN: [id number]     Arrival date & time 08/22/14  1616 History   First MD Initiated Contact with Patient 08/22/14 1632     Chief Complaint  Patient presents with  . Cellulitis     (Consider location/radiation/quality/duration/timing/severity/associated sxs/prior Treatment) HPI Comments: Patient with history of hypertension, hyperlipidemia, CAD, diabetes, presents to the the ED with a chief complaint of left arm cellulitis.  He states that he noticed swelling in his left hand yesterday, which progressed to the left arm today. There is visible streaking from the hand arm. He denies any fevers or chills. He states his pain is 3 out of 10. He has not tried taking anything to alleviate the symptoms. He states that he was seen at urgent care earlier today, and was sent to the emergency department for further evaluation and care. He denies any injury, but states he does bite his nails.  The history is provided by the patient. No language interpreter was used.    Past Medical History  Diagnosis Date  . Hypertension   . Hyperlipidemia   . Coronary artery disease   . Swelling of left lower extremity     LLE; "happens often"  . Myocardial infarction 05/2012    "mild"  . Type II diabetes mellitus   . Gout   . Arthritis   . Cancer    Past Surgical History  Procedure Laterality Date  . Coronary angioplasty with stent placement  06/05/2012    "2; total of 2"  . Back surgery    . Lumbar disc surgery  1990's  . Robot assisted laparoscopic radical prostatectomy N/A 02/25/2014    Procedure: ROBOTIC ASSISTED LAPAROSCOPIC RADICAL  PROSTATECTOMY,  UMBILICAL HERNIA REPAIR;  Surgeon: Molli Hazard, MD;  Location: [facility];  Service: Urology;  Laterality: N/A;  . Lymphadenectomy Bilateral 02/25/2014    Procedure: [facility] LYMPH NODE DISSECTION";  Surgeon: Molli Hazard, MD;  Location: [facility];  Service: Urology;  Laterality: Bilateral;  . Prostate surgery     Family History    Problem Relation Age of Onset  . Breast cancer Mother   . Asthma Father   . Parkinson's disease Father    History  Substance Use Topics  . Smoking status: Former Smoker -- 2.00 packs/day for 30 years    Types: Cigarettes    Quit date: 10/23/1993  . Smokeless tobacco: Never Used  . Alcohol Use: No    Review of Systems  Constitutional: Negative for fever and chills.  Respiratory: Negative for shortness of breath.   Cardiovascular: Negative for chest pain.  Gastrointestinal: Negative for nausea, vomiting, diarrhea and constipation.  Genitourinary: Negative for dysuria.  Skin: Positive for color change.  All other systems reviewed and are negative.     Allergies  Review of patient's allergies indicates no known allergies.  Home Medications   Prior to Admission medications   Medication Sig Start Date End Date Taking? Authorizing Provider  ALLOPURINOL PO Take 1 tablet by mouth daily.   Yes Historical Provider, MD  amLODipine-valsartan (EXFORGE) 10-320 MG per tablet Take 1 tablet by mouth every morning.    Yes Historical Provider, MD  aspirin EC 81 MG tablet Take 81 mg by mouth daily.   Yes Historical Provider, MD  atorvastatin (LIPITOR) 10 MG tablet Take 10 mg by mouth daily.   Yes Historical Provider, MD  insulin glargine (LANTUS) 100 UNIT/ML injection Inject 25 Units into the skin every morning.    Yes Historical Provider, MD  metFORMIN (GLUCOPHAGE)  1000 MG tablet Take 1,000 mg by mouth 2 (two) times daily with a meal.   Yes Historical Provider, MD  metoprolol tartrate (LOPRESSOR) 25 MG tablet Take 12.5 mg by mouth 2 (two) times daily.   Yes Historical Provider, MD  nitroGLYCERIN (NITROSTAT) 0.4 MG SL tablet Place 0.4 mg under the tongue every 5 (five) minutes as needed for chest pain.   Yes Historical Provider, MD  sitaGLIPtin (JANUVIA) 50 MG tablet Take 50 mg by mouth 2 (two) times daily.   Yes Historical Provider, MD   BP 116/67  Pulse 109  Temp(Src) 97.8 F (36.6 C)  (Oral)  Resp 18  SpO2 96% Physical Exam  Nursing note and vitals reviewed. Constitutional: He is oriented to person, place, and time. He appears well-developed and well-nourished.  HENT:  Head: Normocephalic and atraumatic.  Eyes: Conjunctivae and EOM are normal. Pupils are equal, round, and reactive to light. Right eye exhibits no discharge. Left eye exhibits no discharge. No scleral icterus.  Neck: Normal range of motion. Neck supple. No JVD present.  Cardiovascular: Normal rate, regular rhythm and normal heart sounds.  Exam reveals no gallop and no friction rub.   No murmur heard. Pulmonary/Chest: Effort normal and breath sounds normal. No respiratory distress. He has no wheezes. He has no rales. He exhibits no tenderness.  Abdominal: Soft. He exhibits no distension and no mass. There is no tenderness. There is no rebound and no guarding.  Musculoskeletal: Normal range of motion. He exhibits no edema and no tenderness.  Neurological: He is alert and oriented to person, place, and time.  Skin: Skin is warm and dry.     Cellulitis to left hand, streaking up the arm, no sign of abscess or induration  Psychiatric: He has a normal mood and affect. His behavior is normal. Judgment and thought content normal.    ED Course  Procedures (including critical care time) Results for orders placed during the hospital encounter of 08/22/14  CBC WITH DIFFERENTIAL      Result Value Ref Range   WBC 18.6 (*) 4.0 - 10.5 K/uL   RBC 4.66  4.22 - 5.81 MIL/uL   Hemoglobin 13.9  13.0 - 17.0 g/dL   HCT 41.2  39.0 - 52.0 %   MCV 88.4  78.0 - 100.0 fL   MCH 29.8  26.0 - 34.0 pg   MCHC 33.7  30.0 - 36.0 g/dL   RDW 14.0  11.5 - 15.5 %   Platelets 184  150 - 400 K/uL   Neutrophils Relative % 93 (*) 43 - 77 %   Lymphocytes Relative 4 (*) 12 - 46 %   Monocytes Relative 3  3 - 12 %   Eosinophils Relative 0  0 - 5 %   Basophils Relative 0  0 - 1 %   Neutro Abs 17.3 (*) 1.7 - 7.7 K/uL   Lymphs Abs 0.7  0.7  - 4.0 K/uL   Monocytes Absolute 0.6  0.1 - 1.0 K/uL   Eosinophils Absolute 0.0  0.0 - 0.7 K/uL   Basophils Absolute 0.0  0.0 - 0.1 K/uL   Smear Review PLATELET COUNT CONFIRMED BY SMEAR    BASIC METABOLIC PANEL      Result Value Ref Range   Sodium 137  137 - 147 mEq/L   Potassium 4.3  3.7 - 5.3 mEq/L   Chloride 100  96 - 112 mEq/L   CO2 23  19 - 32 mEq/L   Glucose, Bld 211 (*) 70 -  99 mg/dL   BUN 29 (*) 6 - 23 mg/dL   Creatinine, Ser 1.40 (*) 0.50 - 1.35 mg/dL   Calcium 9.0  8.4 - 10.5 mg/dL   GFR calc non Af Amer 52 (*) >90 mL/min   GFR calc Af Amer 60 (*) >90 mL/min   Anion gap 14  5 - 15  I-STAT CG4 LACTIC ACID, ED      Result Value Ref Range   Lactic Acid, Venous 1.70  0.5 - 2.2 mmol/L   No results found.    EKG Interpretation None      MDM   Final diagnoses:  Cellulitis of left arm  Cellulitis of left hand     ICD-9-CM ICD-10-CM  1. Cellulitis of left arm 682.3 L03.114  2. Cellulitis of left hand 682.4 L03.114     Patient with cellulitis left hand and arm. Will treat with vancomycin. Will check labs and plan for admission of the hospital. He is diabetic.  6:50 PM Patient discussed with Dr. Caralyn Guile from hand surgery.  Recommend ABX and long arm splint.  Medications  vancomycin (VANCOCIN) 1,500 mg in sodium chloride 0.9 % 500 mL IVPB (not administered)  vancomycin (VANCOCIN) 2,000 mg in sodium chloride 0.9 % 500 mL IVPB (0 mg Intravenous Stopped 08/22/14 1944)     Patient discussed with Dr. Hal Hope, who will admit the patient.     Montine Circle, PA-C 08/22/14 2105

## 2014-08-22 NOTE — ED Notes (Signed)
Per pt, states he bites nails-swelling of hand started yesterday-went to urgent care and they sent him here for cellulitis

## 2014-08-22 NOTE — Progress Notes (Signed)
This chart was scribed for Arlyss Queen, MD by Einar Pheasant, ED Scribe. This patient was seen in room 9 and the patient's care was started at 3:2 PM.  Subjective:    Patient ID: Gregory [name], male    DOB: 04/11/1951, 63 y.o.   MRN: [id number]  HPI Gregory [name] is a 62 y.o. male with a hx of controlled DM.  Pt is complaining of sudden onset left hand swelling that he first noticed last night. Pt currently has associated redness and streaking going up his left arm. He denies any hx of MRSA. Wife states that the pt was not acting like himself last night. He was pacing back and forth trying to find his keys. Pt does endorse associated chills. Pt denies any nausea or abdominal pain.  Patient Active Problem List   Diagnosis Date Noted  . Malignant neoplasm of prostate 02/25/2014  . Coronary atherosclerosis of native coronary artery 10/06/2013  . Obesity, unspecified 10/06/2013  . Chest pain 06/03/2012  . DM (diabetes mellitus) 06/03/2012  . HTN (hypertension) 06/03/2012  . Hyperlipidemia 06/03/2012   Past Medical History  Diagnosis Date  . Hypertension   . Hyperlipidemia   . Coronary artery disease   . Swelling of left lower extremity     LLE; "happens often"  . Myocardial infarction 05/2012    "mild"  . Type II diabetes mellitus   . Gout   . Arthritis   . Cancer    Past Surgical History  Procedure Laterality Date  . Coronary angioplasty with stent placement  06/05/2012    "2; total of 2"  . Back surgery    . Lumbar disc surgery  1990's  . Robot assisted laparoscopic radical prostatectomy N/A 02/25/2014    Procedure: ROBOTIC ASSISTED LAPAROSCOPIC RADICAL  PROSTATECTOMY,  UMBILICAL HERNIA REPAIR;  Surgeon: Molli Hazard, MD;  Location: [facility];  Service: Urology;  Laterality: N/A;  . Lymphadenectomy Bilateral 02/25/2014    Procedure: [facility] LYMPH NODE DISSECTION";  Surgeon: Molli Hazard, MD;  Location: [facility];  Service: Urology;  Laterality:  Bilateral;  . Prostate surgery     No Known Allergies Prior to Admission medications   Medication Sig Start Date End Date Taking? Authorizing Provider  allopurinol (ZYLOPRIM) 100 MG tablet Take 300 mg by mouth daily.    Yes Historical Provider, MD  amLODipine-valsartan (EXFORGE) 10-320 MG per tablet Take 1 tablet by mouth every morning.    Yes Historical Provider, MD  aspirin EC 81 MG tablet Take 81 mg by mouth daily.   Yes Historical Provider, MD  atorvastatin (LIPITOR) 10 MG tablet Take 10 mg by mouth daily.   Yes Historical Provider, MD  insulin glargine (LANTUS) 100 UNIT/ML injection Inject 25 Units into the skin every morning.    Yes Historical Provider, MD  metFORMIN (GLUCOPHAGE) 1000 MG tablet Take 1,000 mg by mouth 2 (two) times daily with a meal.   Yes Historical Provider, MD  metoprolol tartrate (LOPRESSOR) 25 MG tablet Take 12.5 mg by mouth 2 (two) times daily.   Yes Historical Provider, MD  nitroGLYCERIN (NITROSTAT) 0.4 MG SL tablet Place 0.4 mg under the tongue every 5 (five) minutes as needed for chest pain.   Yes Historical Provider, MD  sitaGLIPtin (JANUVIA) 50 MG tablet Take 50 mg by mouth 2 (two) times daily.   Yes Historical Provider, MD  INVESTIGATIONAL DRUG SIMPLE RECORD daily. Evacetrapib (CETP inhibitor) lipid lowering study with [place] Provider, MD   History  Social History  . Marital Status: Married    Spouse Name: N/A    Number of Children: N/A  . Years of Education: N/A   Occupational History  . Not on file.   Social History Main Topics  . Smoking status: Former Smoker -- 2.00 packs/day for 30 years    Types: Cigarettes    Quit date: 10/23/1993  . Smokeless tobacco: Never Used  . Alcohol Use: No  . Drug Use: No  . Sexual Activity: Not Currently   Other Topics Concern  . Not on file   Social History Narrative  . No narrative on file     Review of Systems  Constitutional: Negative for appetite change and fatigue.  HENT:  Negative for congestion, ear discharge and sinus pressure.   Eyes: Negative for discharge.  Respiratory: Negative for cough.   Cardiovascular: Negative for chest pain.  Gastrointestinal: Negative for abdominal pain and diarrhea.  Genitourinary: Negative for frequency and hematuria.  Musculoskeletal: Positive for arthralgias and joint swelling. Negative for back pain.  Skin: Negative for rash.  Neurological: Negative for seizures and headaches.  Psychiatric/Behavioral: Negative for hallucinations.       Objective:   Physical Exam  Nursing note and vitals reviewed. Constitutional: He appears well-developed and well-nourished. No distress.  HENT:  Head: Normocephalic and atraumatic.  Eyes: Conjunctivae are normal. Right eye exhibits no discharge. Left eye exhibits no discharge.  Neck: Neck supple.  Cardiovascular: Normal rate, regular rhythm and normal heart sounds.  Exam reveals no gallop and no friction rub.   No murmur heard. Pulmonary/Chest: Effort normal and breath sounds normal. No respiratory distress.  Abdominal: Soft. He exhibits no distension. There is no tenderness.  Musculoskeletal: He exhibits no edema and no tenderness.  Neurological: He is alert.  Skin: Skin is warm and dry.  There is redness and swelling over the dorsum of the left hand. There are two steaks of lymphangitis up the left arm into the left axilla. Pt has a tiny blister forming to the dorsum of the left hand.   Psychiatric: He has a normal mood and affect. His behavior is normal. Thought content normal.      Filed Vitals:   08/22/14 1506  BP: 124/70  Pulse: 100  Temp: 98.1 F (36.7 C)  TempSrc: Oral  Resp: 18  Height: 5\' 8"  (1.727 m)  Weight: 220 lb (99.791 kg)  SpO2: 96%   Results for orders placed in visit on 08/22/14  POCT CBC      Result Value Ref Range   WBC 22.0 (*) 4.6 - 10.2 K/uL   Lymph, poc 1.1  0.6 - 3.4   POC LYMPH PERCENT 4.8 (*) 10 - 50 %L   MID (cbc) 0.6  0 - 0.9   POC MID  % 2.7  0 - 12 %M   POC Granulocyte 20.4 (*) 2 - 6.9   Granulocyte percent 92.5 (*) 37 - 80 %G   RBC 4.78  4.69 - 6.13 M/uL   Hemoglobin 14.1  14.1 - 18.1 g/dL   HCT, POC 43.6  43.5 - 53.7 %   MCV 91.3  80 - 97 fL   MCH, POC 29.4  27 - 31.2 pg   MCHC 32.2  31.8 - 35.4 g/dL   RDW, POC 14.7     Platelet Count, POC 209  142 - 424 K/uL   MPV 6.8  0 - 99.8 fL  GLUCOSE, POCT (MANUAL RESULT ENTRY)      Result Value  Ref Range   POC Glucose 241 (*) 70 - 99 mg/dl   Assessment & Plan:  Patient presents with rapidly progressive cellulitis with lymphangitis of the left arm. His white count is 22,000. He will be sent to room at [facility] long for IV antibiotics and further evaluation. His sugar is 241.  I personally performed the services described in this documentation, which was scribed in my presence. The recorded information has been reviewed and is accurate.

## 2014-08-22 NOTE — Progress Notes (Addendum)
ANTIBIOTIC CONSULT NOTE - INITIAL  Pharmacy Consult for Vancomycin/zosyn Indication: L hand cellulitis  No Known Allergies  Patient Measurements:   Total Body Weight: 99.8 kg  Vital Signs: Temp: 97.8 F (36.6 C) (10/31 1622) Temp Source: Oral (10/31 1622) BP: 116/67 mmHg (10/31 1622) Pulse Rate: 109 (10/31 1622) Intake/Output from previous day:   Intake/Output from this shift:    Labs:  Recent Labs  08/22/14 1547  WBC 22.0*  HGB 14.1   The CrCl is unknown because both a height and weight (above a minimum accepted value) are required for this calculation. No results found for this basename: VANCOTROUGH, VANCOPEAK, VANCORANDOM, GENTTROUGH, GENTPEAK, GENTRANDOM, TOBRATROUGH, TOBRAPEAK, TOBRARND, AMIKACINPEAK, AMIKACINTROU, AMIKACIN,  in the last 72 hours   Microbiology: No results found for this or any previous visit (from the past 720 hour(s)).  Medical History: Past Medical History  Diagnosis Date  . Hypertension   . Hyperlipidemia   . Coronary artery disease   . Swelling of left lower extremity     LLE; "happens often"  . Myocardial infarction 05/2012    "mild"  . Type II diabetes mellitus   . Gout   . Arthritis   . Cancer    Medications:  Scheduled:   Anti-infectives   Start     Dose/Rate Route Frequency Ordered Stop   08/22/14 1730  vancomycin (VANCOCIN) 2,000 mg in sodium chloride 0.9 % 500 mL IVPB     2,000 mg 250 mL/hr over 120 Minutes Intravenous  Once 08/22/14 1709       Assessment: 20 yoM seen at Urgent Care with L hand cellulitis, chills noted, lymphangitis to axilla. Begin Vancomycin per Pharmacy  Previous Serum creatinine values elevated, last 02/27/14 (1.71)  Goal of Therapy:  Vancomycin trough level 10-15 mcg/ml Zosyn based on renal function   Plan:   Vancomycin 2gm x1, then further orders based on renal function, BMET to be drawn  Minda Ditto PharmD Pager [phone] 08/22/2014, 5:15 PM  Addendum:18:20  SCr is 1.40, Cl ~ 54  normalized Continue Vancomycin at 1500mg  IV q24 hr Zosyn 3.375g IV Q8H infused over 4hrs.   Minda Ditto PharmD Pager [phone] 08/22/2014, 6:25 PM

## 2014-08-22 NOTE — H&P (Signed)
[facility] History and Physical  STATON MARKEY XVQ:[id number] DOB: 1951/07/11 DOA: 08/22/2014  Referring physician: ER physician. PCP: Kandice Hams, MD   Chief Complaint: Left hand and arm swelling and pain.  HPI: Gregory [name] is a 63 y.o. male history of CAD status post stenting, hypertension, diabetes mellitus, hyperlipidemia, gout started experiencing swelling of the left hand and arm since yesterday which has progressively gotten worse and today and had come to the urgent care and was referred to the ER. Patient denies any trauma or insect bite. Patient states that he usually bites his fingernails. In the ER patient was found to have significant edema of his hand and arm but patient was able to make a fist. Patient has good sensation and and no definite signs of compartment syndrome and on call hand surgeon Dr. Apolonio Schneiders was consulted. Patient has been admitted for further management with IV antibiotics and close observation. Patient states his blood sugar has been running high recently. Patient otherwise denies any fever chills chest pain shortness of breath nausea vomiting abdominal pain.   Review of Systems: As presented in the history of presenting illness, rest negative.  Past Medical History  Diagnosis Date  . Hypertension   . Hyperlipidemia   . Coronary artery disease   . Swelling of left lower extremity     LLE; "happens often"  . Myocardial infarction 05/2012    "mild"  . Type II diabetes mellitus   . Gout   . Arthritis   . Cancer    Past Surgical History  Procedure Laterality Date  . Coronary angioplasty with stent placement  06/05/2012    "2; total of 2"  . Back surgery    . Lumbar disc surgery  1990's  . Robot assisted laparoscopic radical prostatectomy N/A 02/25/2014    Procedure: ROBOTIC ASSISTED LAPAROSCOPIC RADICAL  PROSTATECTOMY,  UMBILICAL HERNIA REPAIR;  Surgeon: Molli Hazard, MD;  Location: [facility];  Service: Urology;  Laterality: N/A;   . Lymphadenectomy Bilateral 02/25/2014    Procedure: [facility] LYMPH NODE DISSECTION";  Surgeon: Molli Hazard, MD;  Location: [facility];  Service: Urology;  Laterality: Bilateral;  . Prostate surgery     Social History:  reports that he quit smoking about 20 years ago. His smoking use included Cigarettes. He has a 60 pack-year smoking history. He has never used smokeless tobacco. He reports that he does not drink alcohol or use illicit drugs. Where does patient live at home. Can patient participate in ADLs? Yes.  No Known Allergies  Family History:  Family History  Problem Relation Age of Onset  . Breast cancer Mother   . Asthma Father   . Parkinson's disease Father       Prior to Admission medications   Medication Sig Start Date End Date Taking? Authorizing Provider  ALLOPURINOL PO Take 1 tablet by mouth daily.   Yes Historical Provider, MD  amLODipine-valsartan (EXFORGE) 10-320 MG per tablet Take 1 tablet by mouth every morning.    Yes Historical Provider, MD  aspirin EC 81 MG tablet Take 81 mg by mouth daily.   Yes Historical Provider, MD  atorvastatin (LIPITOR) 10 MG tablet Take 10 mg by mouth daily.   Yes Historical Provider, MD  insulin glargine (LANTUS) 100 UNIT/ML injection Inject 25 Units into the skin every morning.    Yes Historical Provider, MD  metFORMIN (GLUCOPHAGE) 1000 MG tablet Take 1,000 mg by mouth 2 (two) times daily with a meal.   Yes Historical Provider,  MD  metoprolol tartrate (LOPRESSOR) 25 MG tablet Take 12.5 mg by mouth 2 (two) times daily.   Yes Historical Provider, MD  nitroGLYCERIN (NITROSTAT) 0.4 MG SL tablet Place 0.4 mg under the tongue every 5 (five) minutes as needed for chest pain.   Yes Historical Provider, MD  sitaGLIPtin (JANUVIA) 50 MG tablet Take 50 mg by mouth 2 (two) times daily.   Yes Historical Provider, MD    Physical Exam: Filed Vitals:   08/22/14 1622 08/22/14 1857  BP: 116/67 138/70  Pulse: 109 83  Temp: 97.8 F  (36.6 C) 98.7 F (37.1 C)  TempSrc: Oral Oral  Resp: 18 18  SpO2: 96% 94%     General:  Well-developed and nourished.  Eyes: Anicteric no pallor.  ENT: No discharge from the ears eyes nose mouth.  Neck: No mass felt.  Cardiovascular: S1-S2 heard.  Respiratory: No rhonchi or crepitations.  Abdomen: Soft nontender bowel sounds present. No guarding or rigidity.  Skin: Left upper extremity is mildly erythematous and streaking up to the axilla seen.  Musculoskeletal: Left upper extremities swollen up to the elbow with streaking seen in up to the axilla.  Psychiatric: Appears normal.  Neurologic: Alert awake oriented to time place and person. Moves all extremities.  Labs on Admission:  Basic Metabolic Panel:  Recent Labs Lab 08/22/14 1716  NA 137  K 4.3  CL 100  CO2 23  GLUCOSE 211*  BUN 29*  CREATININE 1.40*  CALCIUM 9.0   Liver Function Tests: No results found for this basename: AST, ALT, ALKPHOS, BILITOT, PROT, ALBUMIN,  in the last 168 hours No results found for this basename: LIPASE, AMYLASE,  in the last 168 hours No results found for this basename: AMMONIA,  in the last 168 hours CBC:  Recent Labs Lab 08/22/14 1547 08/22/14 1716  WBC 22.0* 18.6*  NEUTROABS  --  17.3*  HGB 14.1 13.9  HCT 43.6 41.2  MCV 91.3 88.4  PLT  --  184   Cardiac Enzymes: No results found for this basename: CKTOTAL, CKMB, CKMBINDEX, TROPONINI,  in the last 168 hours  BNP (last 3 results) No results found for this basename: PROBNP,  in the last 8760 hours CBG: No results found for this basename: GLUCAP,  in the last 168 hours  Radiological Exams on Admission: No results found.   Assessment/Plan Principal Problem:   Cellulitis Active Problems:   HTN (hypertension)   Hyperlipidemia   Malignant neoplasm of prostate   Diabetes mellitus type 2, uncontrolled   Chronic kidney disease, stage 3   1. Left upper extremity cellulitis - patient has been placed on  vancomycin and Zosyn after blood cultures obtained. On-call hand surgeon Dr. Apolonio Schneiders has advised patient to be placed on splint. Closely observe for any worsening particularly for compartment syndrome and at this time patient does not have any definite signs of compartment syndrome. Reconsult hand surgeon in a.m. Patient will be kept nothing by mouth past midnight in case patient needs procedures. Check Dopplers to rule out DVT. 2. Diabetes mellitus type 2 uncontrolled - closely follow CBGs. Patient is continued on home medication except for metformin. 3. Hypertension - continue home medications. 4. CAD - on aspirin. 5. Hyperlipidemia - continue statins. 6. History of gout.    Code Status: Full code.  Family Communication: None.  Disposition Plan: Admit to inpatient.    KAKRAKANDY,ARSHAD N. [facility] Pager [phone].  If 7PM-7AM, please contact night-coverage [email or web address] Password [email or web address] 08/22/2014, 9:12 PM

## 2014-08-22 NOTE — ED Notes (Signed)
Attempted to call report, phone call unanswered on [place].

## 2014-08-22 NOTE — Plan of Care (Signed)
Problem: Phase I Progression Outcomes Goal: OOB as tolerated unless otherwise ordered Outcome: Not Met (add Reason) Pt on bedrest

## 2014-08-23 DIAGNOSIS — I10 Essential (primary) hypertension: Secondary | ICD-10-CM | POA: Insufficient documentation

## 2014-08-23 DIAGNOSIS — L03114 Cellulitis of left upper limb: Secondary | ICD-10-CM | POA: Insufficient documentation

## 2014-08-23 LAB — CBC WITH DIFFERENTIAL/PLATELET
Basophils Absolute: 0 10*3/uL (ref 0.0–0.1)
Basophils Relative: 0 % (ref 0–1)
Eosinophils Absolute: 0 10*3/uL (ref 0.0–0.7)
Eosinophils Relative: 0 % (ref 0–5)
HCT: 38.8 % — ABNORMAL LOW (ref 39.0–52.0)
Hemoglobin: 12.8 g/dL — ABNORMAL LOW (ref 13.0–17.0)
Lymphocytes Relative: 6 % — ABNORMAL LOW (ref 12–46)
Lymphs Abs: 1 10*3/uL (ref 0.7–4.0)
MCH: 29.5 pg (ref 26.0–34.0)
MCHC: 33 g/dL (ref 30.0–36.0)
MCV: 89.4 fL (ref 78.0–100.0)
Monocytes Absolute: 0.8 10*3/uL (ref 0.1–1.0)
Monocytes Relative: 5 % (ref 3–12)
Neutro Abs: 15.4 10*3/uL — ABNORMAL HIGH (ref 1.7–7.7)
Neutrophils Relative %: 89 % — ABNORMAL HIGH (ref 43–77)
Platelets: 169 10*3/uL (ref 150–400)
RBC: 4.34 MIL/uL (ref 4.22–5.81)
RDW: 14.1 % (ref 11.5–15.5)
WBC: 17.3 10*3/uL — ABNORMAL HIGH (ref 4.0–10.5)

## 2014-08-23 LAB — GLUCOSE, CAPILLARY
Glucose-Capillary: 104 mg/dL — ABNORMAL HIGH (ref 70–99)
Glucose-Capillary: 117 mg/dL — ABNORMAL HIGH (ref 70–99)
Glucose-Capillary: 119 mg/dL — ABNORMAL HIGH (ref 70–99)
Glucose-Capillary: 152 mg/dL — ABNORMAL HIGH (ref 70–99)
Glucose-Capillary: 121 mg/dL — ABNORMAL HIGH (ref 70–99)
Glucose-Capillary: 139 mg/dL — ABNORMAL HIGH (ref 70–99)
Glucose-Capillary: 113 mg/dL — ABNORMAL HIGH (ref 70–99)

## 2014-08-23 LAB — COMPREHENSIVE METABOLIC PANEL
ALT: 15 U/L (ref 0–53)
AST: 22 U/L (ref 0–37)
Albumin: 3.1 g/dL — ABNORMAL LOW (ref 3.5–5.2)
Alkaline Phosphatase: 59 U/L (ref 39–117)
Anion gap: 10 (ref 5–15)
BUN: 25 mg/dL — ABNORMAL HIGH (ref 6–23)
CO2: 25 mEq/L (ref 19–32)
Calcium: 8.6 mg/dL (ref 8.4–10.5)
Chloride: 103 mEq/L (ref 96–112)
Creatinine, Ser: 1.27 mg/dL (ref 0.50–1.35)
GFR calc Af Amer: 67 mL/min — ABNORMAL LOW (ref >90)
GFR calc non Af Amer: 58 mL/min — ABNORMAL LOW (ref >90)
Glucose, Bld: 116 mg/dL — ABNORMAL HIGH (ref 70–99)
Potassium: 4.6 mEq/L (ref 3.7–5.3)
Sodium: 138 mEq/L (ref 137–147)
Total Bilirubin: 0.7 mg/dL (ref 0.3–1.2)
Total Protein: 6.7 g/dL (ref 6.0–8.3)

## 2014-08-23 MED ORDER — MORPHINE SULFATE 2 MG/ML IJ SOLN
2.0000 mg | INTRAMUSCULAR | Status: DC | PRN
  Administered 2014-08-23 – 2014-08-25 (×3): 2 mg via INTRAVENOUS
  Filled 2014-08-23 (×3): qty 1

## 2014-08-23 NOTE — Consult Note (Signed)
Reason for Consult:left hand swelling and pain Referring Physician: Dr. Shanon Brow Tat  Gregory [name] is an 63 y.o. male.  HPI:63 year old male with a history of diabetes mellitus, hypertension, coronary artery disease, gouty arthritis presents with 1-2 day history of increasing pain and swelling in his left upper extremity. The patient frequently bites his nails and noted some swelling along the cuticle of his left index finger on 08/20/2014. By 08/21/2014, the patient noticed increasing swelling and erythema on the dorsal surface of his left hand area discretion to worsen with increasing edema streaking up into his left antecubital area on 08/21/2014. When he went to work on 08/22/2014, his friends urged him to go to the emergency department because of increasing erythema up into his axilla. The patient denies any recent trauma or unusual outdoor activities. He works as a Art gallery manager. He denied any fevers, chills, chest pain, shortness breath, nausea, vomiting, dizziness. Upon admission, the patient was noted to have WBC 18.6 with low-grade fever of 99.2F. The patient was started on intravenous vancomycin and Zosyn.    Past Medical History  Diagnosis Date  . Hypertension   . Hyperlipidemia   . Coronary artery disease   . Swelling of left lower extremity     LLE; "happens often"  . Myocardial infarction 05/2012    "mild"  . Type II diabetes mellitus   . Gout   . Arthritis   . Cancer     Past Surgical History  Procedure Laterality Date  . Coronary angioplasty with stent placement  06/05/2012    "2; total of 2"  . Back surgery    . Lumbar disc surgery  1990's  . Robot assisted laparoscopic radical prostatectomy N/A 02/25/2014    Procedure: ROBOTIC ASSISTED LAPAROSCOPIC RADICAL  PROSTATECTOMY,  UMBILICAL HERNIA REPAIR;  Surgeon: Molli Hazard, MD;  Location: [facility];  Service: Urology;  Laterality: N/A;  . Lymphadenectomy Bilateral 02/25/2014    Procedure: [facility] LYMPH NODE  DISSECTION";  Surgeon: Molli Hazard, MD;  Location: [facility];  Service: Urology;  Laterality: Bilateral;  . Prostate surgery      Family History  Problem Relation Age of Onset  . Breast cancer Mother   . Asthma Father   . Parkinson's disease Father     Social History:  reports that he quit smoking about 20 years ago. His smoking use included Cigarettes. He has a 60 pack-year smoking history. He has never used smokeless tobacco. He reports that he does not drink alcohol or use illicit drugs.  Allergies: No Known Allergies  Medications: I have reviewed the patient's current medications.  Results for orders placed or performed during the hospital encounter of 08/22/14 (from the past 48 hour(s))  CBC with Differential     Status: Abnormal   Collection Time: 08/22/14  5:16 PM  Result Value Ref Range   WBC 18.6 (H) 4.0 - 10.5 K/uL   RBC 4.66 4.22 - 5.81 MIL/uL   Hemoglobin 13.9 13.0 - 17.0 g/dL   HCT 41.2 39.0 - 52.0 %   MCV 88.4 78.0 - 100.0 fL   MCH 29.8 26.0 - 34.0 pg   MCHC 33.7 30.0 - 36.0 g/dL   RDW 14.0 11.5 - 15.5 %   Platelets 184 150 - 400 K/uL    Comment: RESULT REPEATED AND VERIFIED SPECIMEN CHECKED FOR CLOTS   Neutrophils Relative % 93 (H) 43 - 77 %   Lymphocytes Relative 4 (L) 12 - 46 %   Monocytes Relative 3 3 -  12 %   Eosinophils Relative 0 0 - 5 %   Basophils Relative 0 0 - 1 %   Neutro Abs 17.3 (H) 1.7 - 7.7 K/uL   Lymphs Abs 0.7 0.7 - 4.0 K/uL   Monocytes Absolute 0.6 0.1 - 1.0 K/uL   Eosinophils Absolute 0.0 0.0 - 0.7 K/uL   Basophils Absolute 0.0 0.0 - 0.1 K/uL   Smear Review PLATELET COUNT CONFIRMED BY SMEAR   Basic metabolic panel     Status: Abnormal   Collection Time: 08/22/14  5:16 PM  Result Value Ref Range   Sodium 137 137 - 147 mEq/L   Potassium 4.3 3.7 - 5.3 mEq/L   Chloride 100 96 - 112 mEq/L   CO2 23 19 - 32 mEq/L   Glucose, Bld 211 (H) 70 - 99 mg/dL   BUN 29 (H) 6 - 23 mg/dL   Creatinine, Ser 1.40 (H) 0.50 - 1.35 mg/dL   Calcium  9.0 8.4 - 10.5 mg/dL   GFR calc non Af Amer 52 (L) >90 mL/min   GFR calc Af Amer 60 (L) >90 mL/min    Comment: (NOTE) The eGFR has been calculated using the CKD EPI equation. This calculation has not been validated in all clinical situations. eGFR's persistently <90 mL/min signify possible Chronic Kidney Disease.   Anion gap 14 5 - 15  I-Stat CG4 Lactic Acid, ED     Status: None   Collection Time: 08/22/14  5:31 PM  Result Value Ref Range   Lactic Acid, Venous 1.70 0.5 - 2.2 mmol/L  CBG monitoring, ED     Status: Abnormal   Collection Time: 08/22/14  9:41 PM  Result Value Ref Range   Glucose-Capillary 131 (H) 70 - 99 mg/dL  Glucose, capillary     Status: Abnormal   Collection Time: 08/23/14 12:08 AM  Result Value Ref Range   Glucose-Capillary 152 (H) 70 - 99 mg/dL   Comment 1 Notify RN   Glucose, capillary     Status: Abnormal   Collection Time: 08/23/14  4:02 AM  Result Value Ref Range   Glucose-Capillary 117 (H) 70 - 99 mg/dL  Glucose, capillary     Status: Abnormal   Collection Time: 08/23/14  4:28 AM  Result Value Ref Range   Glucose-Capillary 104 (H) 70 - 99 mg/dL  Comprehensive metabolic panel     Status: Abnormal   Collection Time: 08/23/14  5:25 AM  Result Value Ref Range   Sodium 138 137 - 147 mEq/L   Potassium 4.6 3.7 - 5.3 mEq/L   Chloride 103 96 - 112 mEq/L   CO2 25 19 - 32 mEq/L   Glucose, Bld 116 (H) 70 - 99 mg/dL   BUN 25 (H) 6 - 23 mg/dL   Creatinine, Ser 1.27 0.50 - 1.35 mg/dL   Calcium 8.6 8.4 - 10.5 mg/dL   Total Protein 6.7 6.0 - 8.3 g/dL   Albumin 3.1 (L) 3.5 - 5.2 g/dL   AST 22 0 - 37 U/L    Comment: SLIGHT HEMOLYSIS HEMOLYSIS AT THIS LEVEL MAY AFFECT RESULT    ALT 15 0 - 53 U/L   Alkaline Phosphatase 59 39 - 117 U/L   Total Bilirubin 0.7 0.3 - 1.2 mg/dL   GFR calc non Af Amer 58 (L) >90 mL/min   GFR calc Af Amer 67 (L) >90 mL/min    Comment: (NOTE) The eGFR has been calculated using the CKD EPI equation. This calculation has not been  validated in all  clinical situations. eGFR's persistently <90 mL/min signify possible Chronic Kidney Disease.    Anion gap 10 5 - 15  CBC with Differential     Status: Abnormal   Collection Time: 08/23/14  5:25 AM  Result Value Ref Range   WBC 17.3 (H) 4.0 - 10.5 K/uL   RBC 4.34 4.22 - 5.81 MIL/uL   Hemoglobin 12.8 (L) 13.0 - 17.0 g/dL   HCT 38.8 (L) 39.0 - 52.0 %   MCV 89.4 78.0 - 100.0 fL   MCH 29.5 26.0 - 34.0 pg   MCHC 33.0 30.0 - 36.0 g/dL   RDW 14.1 11.5 - 15.5 %   Platelets 169 150 - 400 K/uL   Neutrophils Relative % 89 (H) 43 - 77 %   Neutro Abs 15.4 (H) 1.7 - 7.7 K/uL   Lymphocytes Relative 6 (L) 12 - 46 %   Lymphs Abs 1.0 0.7 - 4.0 K/uL   Monocytes Relative 5 3 - 12 %   Monocytes Absolute 0.8 0.1 - 1.0 K/uL   Eosinophils Relative 0 0 - 5 %   Eosinophils Absolute 0.0 0.0 - 0.7 K/uL   Basophils Relative 0 0 - 1 %   Basophils Absolute 0.0 0.0 - 0.1 K/uL  Glucose, capillary     Status: Abnormal   Collection Time: 08/23/14  7:19 AM  Result Value Ref Range   Glucose-Capillary 119 (H) 70 - 99 mg/dL  Glucose, capillary     Status: Abnormal   Collection Time: 08/23/14 11:54 AM  Result Value Ref Range   Glucose-Capillary 121 (H) 70 - 99 mg/dL    No results found.  ROS NO RECENT ILLNESSES OR HOSPITALIZATIONS Blood pressure 120/72, pulse 92, temperature 99.8 F (37.7 C), temperature source Oral, resp. rate 16, height _0  (1.727 m), weight 99.74 kg (219 lb 14.2 oz), SpO2 100 %. Physical Exam  General Appearance:  Alert, cooperative, no distress, appears stated age  Head:  Normocephalic, without obvious abnormality, atraumatic  Eyes:  Pupils equal, conjunctiva/corneas clear,         Throat: Lips, mucosa, and tongue normal; teeth and gums normal  Neck: No visible masses     Lungs:   respirations unlabored  Chest Wall:  No tenderness or deformity  Heart:  Regular rate and rhythm,  Abdomen:   Soft, non-tender,         Extremities: LEFT HAND: RESOLVING PARONYCHIA TO  LEFT INDEX FINGER, DORSUM OF HAND SWOLLEN AND RED BUT NOT EVIDENCE OF ABSCESS, CELLULITIC, ERYTHEMA EXTENDING PROXIMALLY AND LYMPHANGITIS. FINGERS WARM WELL PERFUSED GOOD DIGITAL  MOTION NO EVIDENCE OF DEEP SPACE ABSCESS OR DEEP INFECTION IN ARM OR FOREARM  Pulses: 2+ and symmetric  Skin: Skin color, texture, turgor normal, no rashes or lesions     Neurologic: Normal    Assessment/Plan: LEFT UPPER EXTREMITY CELLULITIS  RECOMMENDATIONS: NO SURGERY AT CURRENT TIME OK TO EAT ELEVATE IMMOBILIZE IN LONG ARM SPLINT CONTINUE IV ANTIBIOTICS IF PAIN WORSENS OR REDNESS OR AREA OF ABSCESS DEVELOPS WILL REQUIRE SURGERY O/W CONTINUE WITH MEDICAL MANAGEMENT PLEASE CONTACT ME DIRECTLY IF ANY QUESTIONS [phone]  [name] 08/23/2014, 12:25 PM

## 2014-08-23 NOTE — Consult Note (Signed)
Chart reviewed Continue npo  Will be by this am to see

## 2014-08-23 NOTE — Plan of Care (Signed)
Problem: Phase I Progression Outcomes Goal: Initial discharge plan identified Outcome: Completed/Met Date Met:  08/23/14  Problem: Phase II Progression Outcomes Goal: Discharge plan established Outcome: Completed/Met Date Met:  08/23/14 Goal: Obtain order to discontinue catheter if appropriate Outcome: Not Applicable Date Met:  16/42/90  Problem: Phase III Progression Outcomes Goal: Voiding independently Outcome: Completed/Met Date Met:  08/23/14 Goal: Foley discontinued Outcome: Not Applicable Date Met:  37/95/58

## 2014-08-23 NOTE — Progress Notes (Signed)
PROGRESS NOTE  Gregory [name] OIN:[id number] DOB: 07/24/1951 DOA: 08/22/2014 PCP: Kandice Hams, MD  Brief history 63 year old male with a history of diabetes mellitus, hypertension, coronary artery disease, gouty arthritis presents with 1-2 day history of increasing pain and swelling in his left upper extremity. The patient frequently bites his nails and noted some swelling along the cuticle of his left index finger on 08/20/2014. By 08/21/2014, the patient noticed increasing swelling and erythema on the dorsal surface of his left hand area discretion to worsen with increasing edema streaking up into his left antecubital area on 08/21/2014. When he went to work on 08/22/2014, his friends urged him to go to the emergency department because of increasing erythema up into his axilla. The patient denies any recent trauma or unusual outdoor activities. He works as a Art gallery manager. He denied any fevers, chills, chest pain, shortness breath, nausea, vomiting, dizziness. Upon admission, the patient was noted to have WBC 18.6 with low-grade fever of 99.70F. The patient was started on intravenous vancomycin and Zosyn. Hand surgery was consulted, and Dr. Apolonio Schneiders is to see the patient.  Assessment/Plan: Cellulitis of the left upper extremity -Continue vancomycin and Zosyn pending culture data -Anticipate Dr. Apolonio Schneiders consult -start diet if no surgery planned. Diabetes mellitus type 2 -NovoLog sliding scale -Hemoglobin A1c -Continue Lantus Hypertension -Continue amlodipine and ARB -Monitor renal function CKD stage 2-3 -continue to monitor Hyperlipidemia -Continue statin Coronary artery disease -Continue aspirin  Family Communication:   Pt at beside Disposition Plan:   Home when medically stable   Antibiotics:  Vancomycin 10/31>>>  Zosyn 10/31>>>    Procedures/Studies:  No results found.      Subjective: Patient states that pain is better controlled with increasing morphine  dose. Denies any headache, fevers, chills, chest pain, shortness breath, nausea, vomiting, diarrhea. No abdominal pain.  Objective: Filed Vitals:   08/22/14 2215 08/22/14 2335 08/23/14 0547 08/23/14 0945  BP: 155/73  147/78 120/72  Pulse: 91  88 92  Temp: 99.3 F (37.4 C)  99.8 F (37.7 C)   TempSrc: Oral  Oral   Resp: 16     Height:  5\' 8"  (1.727 m)    Weight:  99.74 kg (219 lb 14.2 oz)    SpO2: 99%  100%     Intake/Output Summary (Last 24 hours) at 08/23/14 1008 Last data filed at 08/23/14 0604  Gross per 24 hour  Intake 503.33 ml  Output    551 ml  Net -47.67 ml   Weight change:  Exam:   General:  Pt is alert, follows commands appropriately, not in acute distress  HEENT: No icterus, No thrush,  [place]/AT  Cardiovascular: RRR, S1/S2, no rubs, no gallops  Respiratory: CTA bilaterally, no wheezing, no crackles, no rhonchi  Abdomen: Soft/+BS, non tender, non distended, no guarding  Extremities: left upper extremity edema and erythema extending from the patient's mid metacarpal area into the left axilla area there is no crepitance. Compartments are soft. Capillary refills less than 2 seconds.  Data Reviewed: Basic Metabolic Panel:  Recent Labs Lab 08/22/14 1716 08/23/14 0525  NA 137 138  K 4.3 4.6  CL 100 103  CO2 23 25  GLUCOSE 211* 116*  BUN 29* 25*  CREATININE 1.40* 1.27  CALCIUM 9.0 8.6   Liver Function Tests:  Recent Labs Lab 08/23/14 0525  AST 22  ALT 15  ALKPHOS 59  BILITOT 0.7  PROT 6.7  ALBUMIN 3.1*   No  results for input(s): LIPASE, AMYLASE in the last 168 hours. No results for input(s): AMMONIA in the last 168 hours. CBC:  Recent Labs Lab 08/22/14 1547 08/22/14 1716 08/23/14 0525  WBC 22.0* 18.6* 17.3*  NEUTROABS  --  17.3* 15.4*  HGB 14.1 13.9 12.8*  HCT 43.6 41.2 38.8*  MCV 91.3 88.4 89.4  PLT  --  184 169   Cardiac Enzymes: No results for input(s): CKTOTAL, CKMB, CKMBINDEX, TROPONINI in the last 168 hours. BNP: Invalid  input(s): POCBNP CBG:  Recent Labs Lab 08/22/14 2141 08/23/14 0008 08/23/14 0402 08/23/14 0428 08/23/14 0719  GLUCAP 131* 152* 117* 104* 119*    No results found for this or any previous visit (from the past 240 hour(s)).   Scheduled Meds: . allopurinol  100 mg Oral Daily  . amLODipine  10 mg Oral Daily  . aspirin EC  81 mg Oral Daily  . atorvastatin  10 mg Oral q1800  . insulin aspart  0-9 Units Subcutaneous TID WC  . insulin glargine  25 Units Subcutaneous q morning - 10a  . irbesartan  300 mg Oral Daily  . linagliptin  5 mg Oral Daily  . metoprolol tartrate  12.5 mg Oral BID  . piperacillin-tazobactam (ZOSYN)  IV  3.375 g Intravenous 3 times per day  . pneumococcal 23 valent vaccine  0.5 mL Intramuscular Tomorrow-1000  . vancomycin  1,500 mg Intravenous Q24H   Continuous Infusions: . sodium chloride 50 mL/hr at 08/23/14 0543     Diego Delancey, DO  [facility] Pager [phone]  If 7PM-7AM, please contact night-coverage [email or web address] Password [email or web address] 08/23/2014, 10:08 AM   LOS: 1 day

## 2014-08-24 LAB — BASIC METABOLIC PANEL
Anion gap: 12 (ref 5–15)
BUN: 20 mg/dL (ref 6–23)
CO2: 25 mEq/L (ref 19–32)
Calcium: 8.7 mg/dL (ref 8.4–10.5)
Chloride: 103 mEq/L (ref 96–112)
Creatinine, Ser: 1.24 mg/dL (ref 0.50–1.35)
GFR calc Af Amer: 69 mL/min — ABNORMAL LOW (ref >90)
GFR calc non Af Amer: 60 mL/min — ABNORMAL LOW (ref >90)
Glucose, Bld: 109 mg/dL — ABNORMAL HIGH (ref 70–99)
Potassium: 4 mEq/L (ref 3.7–5.3)
Sodium: 140 mEq/L (ref 137–147)

## 2014-08-24 LAB — CBC
HCT: 39.7 % (ref 39.0–52.0)
Hemoglobin: 13.1 g/dL (ref 13.0–17.0)
MCH: 29.8 pg (ref 26.0–34.0)
MCHC: 33 g/dL (ref 30.0–36.0)
MCV: 90.4 fL (ref 78.0–100.0)
Platelets: 160 10*3/uL (ref 150–400)
RBC: 4.39 MIL/uL (ref 4.22–5.81)
RDW: 14 % (ref 11.5–15.5)
WBC: 11.9 10*3/uL — ABNORMAL HIGH (ref 4.0–10.5)

## 2014-08-24 LAB — GLUCOSE, CAPILLARY
Glucose-Capillary: 128 mg/dL — ABNORMAL HIGH (ref 70–99)
Glucose-Capillary: 102 mg/dL — ABNORMAL HIGH (ref 70–99)
Glucose-Capillary: 147 mg/dL — ABNORMAL HIGH (ref 70–99)
Glucose-Capillary: 126 mg/dL — ABNORMAL HIGH (ref 70–99)
Glucose-Capillary: 125 mg/dL — ABNORMAL HIGH (ref 70–99)

## 2014-08-24 LAB — HEMOGLOBIN A1C
Hgb A1c MFr Bld: 6.6 % — ABNORMAL HIGH (ref <5.7)
Mean Plasma Glucose: 143 mg/dL — ABNORMAL HIGH (ref <117)

## 2014-08-24 NOTE — Progress Notes (Signed)
PROGRESS NOTE  Gregory [name] JJO:[id number] DOB: Mar 16, 1951 DOA: 08/22/2014 PCP: Kandice Hams, MD   Brief history 63 year old male with a history of diabetes mellitus, hypertension, coronary artery disease, gouty arthritis presents with 1-2 day history of increasing pain and swelling in his left upper extremity. The patient frequently bites his nails and noted some swelling along the cuticle of his left index finger on 08/20/2014. By 08/21/2014, the patient noticed increasing swelling and erythema on the dorsal surface of his left hand area discretion to worsen with increasing edema streaking up into his left antecubital area on 08/21/2014. When he went to work on 08/22/2014, his friends urged him to go to the emergency department because of increasing erythema up into his axilla. The patient denies any recent trauma or unusual outdoor activities. He works as a Art gallery manager. He denied any fevers, chills, chest pain, shortness breath, nausea, vomiting, dizziness. Upon admission, the patient was noted to have WBC 18.6 with low-grade fever of 99.29F. The patient was started on intravenous vancomycin and Zosyn. Hand surgery was consulted, and Dr. Apolonio Schneiders saw the patient and, he did not feel that the patient needed any surgical intervention.  Assessment/Plan: Cellulitis of the left upper extremity -Continue vancomycin  -d/c zosyn -Appreciate Dr. Apolonio Schneiders consult-->continue long arm splint and keep arm elevated -am cbc Diabetes mellitus type 2 -NovoLog sliding scale -Hemoglobin A1c--pending -CBGs controlled -Continue Lantus Hypertension -Continue amlodipine and ARB -Monitor renal function CKD stage 2-3 -continue to monitor -Baseline creatinine 1.1-1.4 Hyperlipidemia -Continue statin Coronary artery disease -Continue aspirin  Family Communication: Pt at beside Disposition Plan: Home 08/25/14 if stable   Antibiotics:  Vancomycin 08/22/14>>>  Zosyn  08/22/14>>08/24/14  Intake/Output Summary (Last 24 hours) at 08/24/14 1336 Last data filed at 08/24/14 0607  Gross per 24 hour  Intake   2250 ml  Output   1575 ml  Net    675 ml   Weight change:  Exam:   General:  Pt is alert, follows commands appropriately, not in acute distress  HEENT: No icterus, No thrush,[place]/AT  Cardiovascular: RRR, S1/S2, no rubs, no gallops  Respiratory: CTA bilaterally, no wheezing, no crackles, no rhonchi  Abdomen: Soft/+BS, non tender, non distended, no guarding  Extremities: left upper extremity edema and erythema extending from the patient's mid metacarpal area into the left axilla area there is no crepitance. Compartments are soft. Capillary refills less than 2 seconds.  Data Reviewed: Basic Metabolic Panel:  Recent Labs Lab 08/22/14 1716 08/23/14 0525 08/24/14 0445  NA 137 138 140  K 4.3 4.6 4.0  CL 100 103 103  CO2 23 25 25   GLUCOSE 211* 116* 109*  BUN 29* 25* 20  CREATININE 1.40* 1.27 1.24  CALCIUM 9.0 8.6 8.7   Liver Function Tests:  Recent Labs Lab 08/23/14 0525  AST 22  ALT 15  ALKPHOS 59  BILITOT 0.7  PROT 6.7  ALBUMIN 3.1*   No results for input(s): LIPASE, AMYLASE in the last 168 hours. No results for input(s): AMMONIA in the last 168 hours. CBC:  Recent Labs Lab 08/22/14 1547 08/22/14 1716 08/23/14 0525 08/24/14 0445  WBC 22.0* 18.6* 17.3* 11.9*  NEUTROABS  --  17.3* 15.4*  --   HGB 14.1 13.9 12.8* 13.1  HCT 43.6 41.2 38.8* 39.7  MCV 91.3 88.4 89.4 90.4  PLT  --  184 169 160   Cardiac Enzymes: No results for input(s): CKTOTAL, CKMB, CKMBINDEX, TROPONINI in the last 168 hours. BNP:  Invalid input(s): POCBNP CBG:  Recent Labs Lab 08/23/14 1605 08/23/14 2032 08/23/14 2218 08/24/14 0802 08/24/14 1214  GLUCAP 139* 128* 113* 102* 147*    Recent Results (from the past 240 hour(s))  Blood culture (routine x 2)     Status: None (Preliminary result)   Collection Time: 08/22/14  5:16 PM  Result Value  Ref Range Status   Specimen Description BLOOD BLOOD RIGHT FOREARM  Final   Special Requests BOTTLES DRAWN AEROBIC AND ANAEROBIC 5CC  Final   Culture  Setup Time   Final    08/22/2014 22:17 Performed at [facility]    Culture   Final           BLOOD CULTURE RECEIVED NO GROWTH TO DATE CULTURE WILL BE HELD FOR 5 DAYS BEFORE ISSUING A FINAL NEGATIVE REPORT Performed at [facility]    Report Status PENDING  Incomplete  Blood culture (routine x 2)     Status: None (Preliminary result)   Collection Time: 08/22/14  5:57 PM  Result Value Ref Range Status   Specimen Description BLOOD RIGHT HAND  Final   Special Requests BOTTLES DRAWN AEROBIC AND ANAEROBIC 5CC  Final   Culture  Setup Time   Final    08/23/2014 04:11 Performed at [facility]    Culture   Final           BLOOD CULTURE RECEIVED NO GROWTH TO DATE CULTURE WILL BE HELD FOR 5 DAYS BEFORE ISSUING A FINAL NEGATIVE REPORT Performed at [facility]    Report Status PENDING  Incomplete     Scheduled Meds: . allopurinol  100 mg Oral Daily  . amLODipine  10 mg Oral Daily  . aspirin EC  81 mg Oral Daily  . atorvastatin  10 mg Oral q1800  . insulin aspart  0-9 Units Subcutaneous TID WC  . insulin glargine  25 Units Subcutaneous q morning - 10a  . irbesartan  300 mg Oral Daily  . linagliptin  5 mg Oral Daily  . metoprolol tartrate  12.5 mg Oral BID  . pneumococcal 23 valent vaccine  0.5 mL Intramuscular Tomorrow-1000  . vancomycin  1,500 mg Intravenous Q24H   Continuous Infusions:    Kaushik Maul, DO  [facility] Pager [phone]  If 7PM-7AM, please contact night-coverage [email or web address] Password [email or web address] 08/24/2014, 1:36 PM   LOS: 2 days

## 2014-08-24 NOTE — Discharge Summary (Signed)
Physician Discharge Summary  Gregory [name] NID:[id number] DOB: September 28, 1951 DOA: 08/22/2014  PCP: Kandice Hams, MD  Admit date: 08/22/2014 Discharge date: 08/25/14 Recommendations for Outpatient Follow-up:  1. Pt will need to follow up with PCP in 2 weeks post discharge 2. Please obtain BMP and cbc in one week 3. Follow up with Dr. Caralyn Guile 08/26/14  Discharge Diagnoses:  Cellulitis of the left upper extremity -patient frequently bites his nails, and the site of initial infection involved the cuticle of his index finger -Continue vancomycin  -d/c zosyn -Appreciate Dr. Apolonio Schneiders consult-->continue long arm splint and keep arm elevated -am cbc--WBC 9.5 on day of d/c;  WBC 18.6 on day of admission -patient will go home with Augmentin and doxycycline for 7 additional days which will complete 10 days of therapy Diabetes mellitus type 2 -NovoLog sliding scale -Hemoglobin A1c--6.6 -CBGs controlled -Continue Lantus 25 units daily -restart metformin after d/c--monitor renal function Hypertension -Continue amlodipine and ARB -Monitor renal function CKD stage 2-3 -continue to monitor -Baseline creatinine 1.1-1.4 Hyperlipidemia -Continue statin Coronary artery disease -Continue aspirin  Discharge Condition: stable  Disposition:  Follow-up Information    Follow up with [name], MD In 1 day.   Specialty:  Orthopedic Surgery   Contact information:   [address] [place] [address] [place] [address] [phone]     home  Diet: Carb modified Wt Readings from Last 3 Encounters:  08/22/14 99.74 kg (219 lb 14.2 oz)  08/22/14 99.791 kg (220 lb)  05/11/14 96.707 kg (213 lb 3.2 oz)    History of present illness:  63 year old male with a history of diabetes mellitus, hypertension, coronary artery disease, gouty arthritis presents with 1-2 day history of increasing pain and swelling in his left upper extremity. The patient frequently bites his nails and noted some  swelling along the cuticle of his left index finger on 08/20/2014. By 08/21/2014, the patient noticed increasing swelling and erythema on the dorsal surface of his left hand area discretion to worsen with increasing edema streaking up into his left antecubital area on 08/21/2014. When he went to work on 08/22/2014, his friends urged him to go to the emergency department because of increasing erythema up into his axilla. The patient denies any recent trauma or unusual outdoor activities. He works as a Art gallery manager. He denied any fevers, chills, chest pain, shortness breath, nausea, vomiting, dizziness. Upon admission, the patient was noted to have WBC 18.6 with low-grade fever of 99.29F. The patient was started on intravenous vancomycin and Zosyn. Hand surgery was consulted, and Dr. Apolonio Schneiders saw the patient and, he did not feel that the patient needed any surgical intervention.  The patient's blood cultures remained negative. Zosyn was discontinued and the patient was continued on vancomycin. He continued to improve clinically. His WBC normalized. The patient will be sent home with Augmentin and doxy for 7 additional days which will complete one week of therapy.    Consultants: Hand surgery--Dr. Caralyn Guile  Discharge Exam: Filed Vitals:   08/25/14 0538  BP: 149/80  Pulse: 72  Temp: 98.4 F (36.9 C)  Resp: 18   Filed Vitals:   08/24/14 1040 08/24/14 1400 08/24/14 2200 08/25/14 0538  BP: 126/65 106/74 133/78 149/80  Pulse: 80 88 77 72  Temp: 98.6 F (37 C) 98.7 F (37.1 C) 98.2 F (36.8 C) 98.4 F (36.9 C)  TempSrc: Oral Oral Oral Oral  Resp: 18 18 18 18   Height:      Weight:      SpO2: 97% 94%  94% 93%   General: A&O x 3, NAD, pleasant, cooperative Cardiovascular: RRR, no rub, no gallop, no S3 Respiratory: CTAB, no wheeze, no rhonchi Abdomen:soft, nontender, nondistended, positive bowel sounds Extremities: L-upper ext in splint--visible hand and upper arm with mild erythema without necrosis or  crepitance.  Discharge Instructions      Discharge Instructions    Diet - low sodium heart healthy    Complete by:  As directed      Discharge instructions    Complete by:  As directed   Follow up with Dr. Iran Planas on 08/26/14     Increase activity slowly    Complete by:  As directed             Medication List    TAKE these medications        ALLOPURINOL PO  Take 1 tablet by mouth daily.     amLODipine-valsartan 10-320 MG per tablet  Commonly known as:  EXFORGE  Take 1 tablet by mouth every morning.     amoxicillin-clavulanate 875-125 MG per tablet  Commonly known as:  AUGMENTIN  Take 1 tablet by mouth 2 (two) times daily.     aspirin EC 81 MG tablet  Take 81 mg by mouth daily.     atorvastatin 10 MG tablet  Commonly known as:  LIPITOR  Take 10 mg by mouth daily.     doxycycline 100 MG capsule  Commonly known as:  VIBRAMYCIN  Take 1 capsule (100 mg total) by mouth 2 (two) times daily.     HYDROcodone-acetaminophen 5-325 MG per tablet  Commonly known as:  NORCO  Take 1 tablet by mouth every 6 (six) hours as needed for moderate pain.     insulin glargine 100 UNIT/ML injection  Commonly known as:  LANTUS  Inject 25 Units into the skin every morning.     metFORMIN 1000 MG tablet  Commonly known as:  GLUCOPHAGE  Take 1,000 mg by mouth 2 (two) times daily with a meal.     metoprolol tartrate 25 MG tablet  Commonly known as:  LOPRESSOR  Take 12.5 mg by mouth 2 (two) times daily.     nitroGLYCERIN 0.4 MG SL tablet  Commonly known as:  NITROSTAT  Place 0.4 mg under the tongue every 5 (five) minutes as needed for chest pain.     sitaGLIPtin 50 MG tablet  Commonly known as:  JANUVIA  Take 50 mg by mouth 2 (two) times daily.     UNABLE TO FIND  Mr. Garbers was admitted to the hospital from 08/22/14 until 08/25/14.         The results of significant diagnostics from this hospitalization (including imaging, microbiology, ancillary and laboratory)  are listed below for reference.    Significant Diagnostic Studies: No results found.   Microbiology: Recent Results (from the past 240 hour(s))  Blood culture (routine x 2)     Status: None (Preliminary result)   Collection Time: 08/22/14  5:16 PM  Result Value Ref Range Status   Specimen Description BLOOD BLOOD RIGHT FOREARM  Final   Special Requests BOTTLES DRAWN AEROBIC AND ANAEROBIC 5CC  Final   Culture  Setup Time   Final    08/22/2014 22:17 Performed at [facility]    Culture   Final           BLOOD CULTURE RECEIVED NO GROWTH TO DATE CULTURE WILL BE HELD FOR 5 DAYS BEFORE ISSUING A FINAL NEGATIVE REPORT Performed at [facility]    Report Status PENDING  Incomplete  Blood culture (routine x 2)     Status: None (Preliminary result)   Collection Time: 08/22/14  5:57 PM  Result Value Ref Range Status   Specimen Description BLOOD RIGHT HAND  Final   Special Requests BOTTLES DRAWN AEROBIC AND ANAEROBIC 5CC  Final   Culture  Setup Time   Final    08/23/2014 04:11 Performed at [facility]    Culture   Final           BLOOD CULTURE RECEIVED NO GROWTH TO DATE CULTURE WILL BE HELD FOR 5 DAYS BEFORE ISSUING A FINAL NEGATIVE REPORT Performed at [facility]    Report Status PENDING  Incomplete     Labs: Basic Metabolic Panel:  Recent Labs Lab 08/22/14 1716 08/23/14 0525 08/24/14 0445  NA 137 138 140  K 4.3 4.6 4.0  CL 100 103 103  CO2 23 25 25   GLUCOSE 211* 116* 109*  BUN 29* 25* 20  CREATININE 1.40* 1.27 1.24  CALCIUM 9.0 8.6 8.7   Liver Function Tests:  Recent Labs Lab 08/23/14 0525  AST 22  ALT 15  ALKPHOS 59  BILITOT 0.7  PROT 6.7  ALBUMIN 3.1*   No results for input(s): LIPASE, AMYLASE in the last 168 hours. No results for input(s): AMMONIA in the last 168 hours. CBC:  Recent Labs Lab 08/22/14 1547 08/22/14 1716 08/23/14 0525 08/24/14 0445 08/25/14 0605  WBC 22.0* 18.6* 17.3* 11.9* 9.5  NEUTROABS  --   17.3* 15.4*  --   --   HGB 14.1 13.9 12.8* 13.1 13.8  HCT 43.6 41.2 38.8* 39.7 41.9  MCV 91.3 88.4 89.4 90.4 90.1  PLT  --  184 169 160 197   Cardiac Enzymes: No results for input(s): CKTOTAL, CKMB, CKMBINDEX, TROPONINI in the last 168 hours. BNP: Invalid input(s): POCBNP CBG:  Recent Labs Lab 08/23/14 2218 08/24/14 0802 08/24/14 1214 08/24/14 1710 08/24/14 2151  GLUCAP 113* 102* 147* 126* 125*    Time coordinating discharge:  Greater than 30 minutes  Signed:  Calianna Kim, DO [facility] Pager: [phone] 08/25/2014, 7:48 AM

## 2014-08-24 NOTE — Plan of Care (Signed)
Problem: Phase II Progression Outcomes Goal: Progress activity as tolerated unless otherwise ordered Outcome: Progressing Goal: Vital signs remain stable Outcome: Progressing

## 2014-08-25 DIAGNOSIS — E785 Hyperlipidemia, unspecified: Secondary | ICD-10-CM

## 2014-08-25 LAB — GLUCOSE, CAPILLARY: Glucose-Capillary: 110 mg/dL — ABNORMAL HIGH (ref 70–99)

## 2014-08-25 LAB — CBC
HCT: 41.9 % (ref 39.0–52.0)
Hemoglobin: 13.8 g/dL (ref 13.0–17.0)
MCH: 29.7 pg (ref 26.0–34.0)
MCHC: 32.9 g/dL (ref 30.0–36.0)
MCV: 90.1 fL (ref 78.0–100.0)
Platelets: 197 10*3/uL (ref 150–400)
RBC: 4.65 MIL/uL (ref 4.22–5.81)
RDW: 13.9 % (ref 11.5–15.5)
WBC: 9.5 10*3/uL (ref 4.0–10.5)

## 2014-08-25 MED ORDER — HYDROCODONE-ACETAMINOPHEN 5-325 MG PO TABS: 1.0000 | ORAL_TABLET | Freq: Four times a day (QID) | ORAL | Status: DC | PRN

## 2014-08-25 MED ORDER — UNABLE TO FIND: Status: DC

## 2014-08-25 MED ORDER — DOXYCYCLINE HYCLATE 100 MG PO CAPS: 100.0000 mg | ORAL_CAPSULE | Freq: Two times a day (BID) | ORAL | Status: DC

## 2014-08-25 MED ORDER — AMOXICILLIN-POT CLAVULANATE 875-125 MG PO TABS: 1.0000 | ORAL_TABLET | Freq: Two times a day (BID) | ORAL | Status: DC

## 2014-08-25 NOTE — Discharge Summary (Signed)
Reviewed discharge instructions including medications, precautions, and follow up appointment.  Pt verbalized understanding of all instructions without questions.  Pt being d/c into care of spouse.

## 2014-08-25 NOTE — Plan of Care (Signed)
Problem: Phase I Progression Outcomes Goal: OOB as tolerated unless otherwise ordered Outcome: Completed/Met Date Met:  08/25/14     

## 2014-08-25 NOTE — Plan of Care (Signed)
Problem: Phase II Progression Outcomes Goal: Progress activity as tolerated unless otherwise ordered Outcome: Completed/Met Date Met:  08/25/14

## 2014-08-25 NOTE — Plan of Care (Signed)
Problem: Phase II Progression Outcomes Goal: Vital signs remain stable Outcome: Completed/Met Date Met:  08/25/14

## 2014-08-28 LAB — CULTURE, BLOOD (ROUTINE X 2): Culture: NO GROWTH

## 2014-08-31 LAB — CULTURE, BLOOD (ROUTINE X 2): Culture: NO GROWTH

## 2014-09-18 ENCOUNTER — Ambulatory Visit (INDEPENDENT_AMBULATORY_CARE_PROVIDER_SITE_OTHER): Payer: Self-pay | Admitting: Family Medicine

## 2014-09-18 VITALS — BP 126/72 | Wt 219.6 lb

## 2014-09-18 DIAGNOSIS — E119 Type 2 diabetes mellitus without complications: Secondary | ICD-10-CM

## 2014-09-18 NOTE — Assessment & Plan Note (Signed)
Subjective:  Patient presents today for 3 month diabetes follow-up as part of the employer-sponsored Link to Wellness program. Current diabetes regimen includes Lantus 25 units SQ once daily, metformin 1000 mg BID, and Januvia 50 mg daily. Patient also continues on daily ASA, ARB and statin.   Patient states that he was admitted to the hospital on Halloween for a blood infection that started in his hands. He received IV anitibiotics but did not need surgery. He states that he bites his nails and that is what caused the infection. His left hand was infected, it is still swollen as compared to his other hand. He has a pending appointment with the hand surgeon in 2 weeks for follow up. He continues to work with a physical therapist for urinary continence issues that arose after he had surgery to remove his prostate.  He does not know what his A1C is. He has a pending appointment to see Dr. Delfina Redwood in a couple of weeks.  Advanced Care Planning: Date Discussed 11/27/2011; Code Status FULL CODE  Disease Assessments:  Diabetes: Type of Diabetes: Type 2; Sees Diabetes provider 4 or more times per year; Diabetes Education Comprehensive group @ [facility]; MD managing Diabetes Seward Carol; Year of diagnosis 2010; takes medications as prescribed; checks feet daily; takes an aspirin a day; hypoglycemia frequency never;   checks blood glucose less than 1 time a week;   Highest CBG 140s; Lowest CBG 80; does not use glucometer;   Other Diabetes History:  Patient did not bring his meter with him to the appointment. He states that he is not checking his blood sugar much at all anyways. He states he has checked maybe once or twice since his last visit with me.  He denies hypoglycemia.  A1C today was 6.2%.          Tobacco Assessment: Smoking Status: Former smoker Quit smoking 1995.; Last Reviewed: 09/14/2014  smoked for 8 years; Date quit smoking: 1993   Social History:  Exercise habits: none need knee  replacement, limits physical activity; Patient does not know the purpose/use of medications; Denies alcohol use; Caffeine use: coffee soda 4 per day [facility]. Discouraged [employer]. Encouraged Diet Siera Mist, Diet 7-up, Diet [place]ale; Patient can afford medications; Medication adherence adherent; Diet adherence 50-75% of the time; Exercise adherence Never; 0 minutes of exercise per week.  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 He states he is having knee pain and his hand/arm is still swollen. He did go back to work and he is walking and moving things at work. He is working with a physical therapist with the Urology office to help strengthen his pelvic floor muscles. He wants to start exercising again.  Nutrition-  Patient has gained 6 pounds since his last visit with me. He admits that he is going once or twice a week to get fast food for breakfast. He states he is trying to eat raisin bran for breakfast in the morning.  L- Sometimes he gets a steak and cheese from [employer]. He admits that he is not eating lunch every day.  D- Sometimes [employer]- gets the grilled chicken. 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 He states that sometimes he is eating at [employer] at [facility].  He states that he thinks his weight has  gone back up because he is eating the wrong things.    Preventive Care:      Colonoscopy: 12/02/2008  Dilated Eye Exam: 10/22/2012  Flu vaccine: 07/23/2014  Foot Exam: 06/10/2012  Other Preventive Care Notes:  Dental Visit- none lately.      Overall Health Assessments:  Vision:  Dilated Eye Exam: 10/22/2012   Vital Signs:     09/18/2014 11:00 AM (EST)Blood Pressure 126 / 72 mm/HgBMI 33.4; Height 5 ft 8 in; Weight 219.6 lbs   Testing:   Blood Sugar Tests: Hemoglobin A1c: 6.2 via POCT resulted on 09/14/2014        Care Planning:  Learning Preference Assessment:  Learner: Patient, Significant Other  Readiness to Learn Barriers: None  Teaching Method: Explanation, Handout  Evaluation of Learning: Can function independently and verbalize knowledge  Readiness to Change:  How important is your health to you? 10  How confident are you in working to improve your health? 8  How ready are you to change to improve your health? 8  Total Score: 9  Care Plan:  09/18/2014 11:01 AM (EST) (4)  Problem: CBG Monitoring  Role: Clinical Pharmacist  Long Term Goal Start checking blood sugar at least once daly  Date Started: 09/14/2014  09/18/2014 11:01 AM (EST) (3)  Problem: Overdue for vision screening  Role: Clinical Pharmacist  Long Term Goal Make an appointment to get your eyes checked for an annual exam  Date Started: 09/14/2014  09/18/2014 11:00 AM (EST) (2)  Problem: Overdue for Dental Exam  Role: Clinical Pharmacist  Long Term Goal Make an appointment to see the dentist  Date Started: 09/14/2014  09/18/2014 11:00 AM (EST) (1)  Problem: Physical Inactivity  Role: Clinical Pharmacist  Long Term Goal Start doing physical therapy exercises once daily while you are at work.  Date Started: 09/14/2014  Short Term Goal #1: Go to the [employer] once a week to walk or ride the recumbent bicycle  Date Started: 09/14/2014     Assessment/Plan: Patient is a 63 year old male with DM2. A1C today was 6.2% and is meeting goal of less than 7%. Patient has been occupied with other health concerns in the past few weeks and he has stopped performing certain diabetes self management care like checking blood sugar. He states that he still continues to take his medication daily (Januvia, metformin and Lantus). Encouraged patient to start checking blood sugar at least once daily. Patient states that he thinks he can do this.  Weight has gone up a  few pounds since his last visit with me. Patient states that he has been eating "things I know I shouldn't." 24 hour recall shows that patient has a poor diet. He often skips meals or will just have a bag of chips from the vending machine. He does not get very many servings of fruits and vegetables. Counseled with patient on ways that he can start eating lunch or dinner instead of skipping a meal. He works several jobs and often does not have time to eat while working or during transit from job to job. He has a refrigerator at his main job. I encouraged him to bring food from home to leave in the fridge so he could eat lunch and dinner while there.  Patient continues to struggle with getting physical activity in outside of his jobs. He has a membership to [employer] that has gone mainly unused. Patient thinks that he could probably go and walk on Mondays when the barber  shop is closed. I encouraged him in this goal. Patient also has physical therapy exercises to do to help improve urinary continence. I also encouraged him to start doing these at least once every day.  I will send a copy of A1C to Dr. Lina Sar office.  Follow up with patient in 3 months.  Goals for Next Visit- 1. Make an appointment to get your eyes checked for an eye exam. 2. Make an appointment to get your teeth cleaned and checked with the dentist. 3. Increase physical activity. Start doing your physical therapy exercises every day while you are at the shop. Aim to go to the [facility] once a week to either walk or ride the recumbent bicycle. 4. Start checking blood sugar once daily before you eat breakfast. Next appointment to see me is Monday February 29th at 9 AM.

## 2014-10-01 ENCOUNTER — Encounter (HOSPITAL_COMMUNITY): Payer: Self-pay | Admitting: Interventional Cardiology

## 2014-10-12 NOTE — Progress Notes (Signed)
Patient ID: Gregory [name], male   DOB: 1951-08-24, 63 y.o.   MRN: [id number] Reviewed: Agree with the documentation and management of our [place].

## 2014-10-14 ENCOUNTER — Other Ambulatory Visit: Payer: Self-pay | Admitting: Nurse Practitioner

## 2014-10-22 ENCOUNTER — Other Ambulatory Visit: Payer: Self-pay | Admitting: Nurse Practitioner

## 2014-11-04 ENCOUNTER — Telehealth: Payer: Self-pay | Admitting: Interventional Cardiology

## 2014-11-04 NOTE — Telephone Encounter (Signed)
Spoke with patient and he is not taking Brillinta, but Plavix. When to stop prior to procedure?

## 2014-11-04 NOTE — Telephone Encounter (Signed)
New message      Request for surgical clearance:  1. What type of surgery is being performed? colonscopy   2. When is this surgery scheduled? 11-10-14  3. Are there any medications that need to be held prior to surgery and how long?hold brinlinta?  4. Name of physician performing surgery? Dr Benson Norway   5. What is your office phone and fax number? Fax [phone]

## 2014-11-04 NOTE — Telephone Encounter (Signed)
Gregory [name] at dr. Ulyses Amor office advised patient needs to stop Plavix 5 days prior to colonoscopy. Patient also informed of these instructions.

## 2014-11-04 NOTE — Telephone Encounter (Signed)
OK to hold Plavix 5 days prior to procedure. 

## 2014-11-06 ENCOUNTER — Other Ambulatory Visit: Payer: Self-pay | Admitting: Interventional Cardiology

## 2014-11-06 NOTE — Telephone Encounter (Signed)
He is an Animal nutritionist patient.  I should see him at some point in the next few months.

## 2014-11-06 NOTE — Telephone Encounter (Signed)
Please advise on refill. Patient has never been seen in this office nor have we ever refilled anything for him. Thanks, MI

## 2014-11-06 NOTE — Telephone Encounter (Signed)
Forwarding this to Alvis Lemmings RN to see if this is an [place] patient of Dr. Irish Lack. No records in chart.

## 2014-11-19 ENCOUNTER — Encounter: Payer: Self-pay | Admitting: Interventional Cardiology

## 2014-11-20 ENCOUNTER — Encounter: Payer: Self-pay | Admitting: Interventional Cardiology

## 2015-02-05 ENCOUNTER — Encounter (HOSPITAL_COMMUNITY): Payer: Self-pay | Admitting: *Deleted

## 2015-02-05 ENCOUNTER — Inpatient Hospital Stay (HOSPITAL_COMMUNITY)
Admission: EM | Admit: 2015-02-05 | Discharge: 2015-02-13 | DRG: 871 | Disposition: A | Discharge status: Home / Self Care | Payer: 59 | Attending: Internal Medicine | Admitting: Internal Medicine

## 2015-02-05 ENCOUNTER — Emergency Department (HOSPITAL_COMMUNITY): Payer: 59

## 2015-02-05 DIAGNOSIS — R140 Abdominal distension (gaseous): Secondary | ICD-10-CM

## 2015-02-05 DIAGNOSIS — Z79899 Other long term (current) drug therapy: Secondary | ICD-10-CM

## 2015-02-05 DIAGNOSIS — J189 Pneumonia, unspecified organism: Secondary | ICD-10-CM

## 2015-02-05 DIAGNOSIS — J9601 Acute respiratory failure with hypoxia: Secondary | ICD-10-CM

## 2015-02-05 DIAGNOSIS — I5033 Acute on chronic diastolic (congestive) heart failure: Secondary | ICD-10-CM | POA: Diagnosis present

## 2015-02-05 DIAGNOSIS — R0602 Shortness of breath: Secondary | ICD-10-CM

## 2015-02-05 DIAGNOSIS — R0600 Dyspnea, unspecified: Secondary | ICD-10-CM

## 2015-02-05 DIAGNOSIS — E785 Hyperlipidemia, unspecified: Secondary | ICD-10-CM | POA: Diagnosis present

## 2015-02-05 DIAGNOSIS — N179 Acute kidney failure, unspecified: Secondary | ICD-10-CM | POA: Diagnosis not present

## 2015-02-05 DIAGNOSIS — N189 Chronic kidney disease, unspecified: Secondary | ICD-10-CM

## 2015-02-05 DIAGNOSIS — N183 Chronic kidney disease, stage 3 (moderate): Secondary | ICD-10-CM | POA: Diagnosis present

## 2015-02-05 DIAGNOSIS — E860 Dehydration: Secondary | ICD-10-CM | POA: Diagnosis present

## 2015-02-05 DIAGNOSIS — Z803 Family history of malignant neoplasm of breast: Secondary | ICD-10-CM

## 2015-02-05 DIAGNOSIS — Z794 Long term (current) use of insulin: Secondary | ICD-10-CM

## 2015-02-05 DIAGNOSIS — I10 Essential (primary) hypertension: Secondary | ICD-10-CM

## 2015-02-05 DIAGNOSIS — A419 Sepsis, unspecified organism: Principal | ICD-10-CM

## 2015-02-05 DIAGNOSIS — I472 Ventricular tachycardia: Secondary | ICD-10-CM | POA: Diagnosis present

## 2015-02-05 DIAGNOSIS — R6510 Systemic inflammatory response syndrome (SIRS) of non-infectious origin without acute organ dysfunction: Secondary | ICD-10-CM | POA: Diagnosis present

## 2015-02-05 DIAGNOSIS — M1000 Idiopathic gout, unspecified site: Secondary | ICD-10-CM | POA: Diagnosis present

## 2015-02-05 DIAGNOSIS — R509 Fever, unspecified: Secondary | ICD-10-CM

## 2015-02-05 DIAGNOSIS — J9600 Acute respiratory failure, unspecified whether with hypoxia or hypercapnia: Secondary | ICD-10-CM

## 2015-02-05 DIAGNOSIS — I5032 Chronic diastolic (congestive) heart failure: Secondary | ICD-10-CM | POA: Diagnosis present

## 2015-02-05 DIAGNOSIS — I129 Hypertensive chronic kidney disease with stage 1 through stage 4 chronic kidney disease, or unspecified chronic kidney disease: Secondary | ICD-10-CM | POA: Diagnosis present

## 2015-02-05 DIAGNOSIS — IMO0002 Reserved for concepts with insufficient information to code with codable children: Secondary | ICD-10-CM

## 2015-02-05 DIAGNOSIS — E1165 Type 2 diabetes mellitus with hyperglycemia: Secondary | ICD-10-CM | POA: Diagnosis present

## 2015-02-05 DIAGNOSIS — R17 Unspecified jaundice: Secondary | ICD-10-CM | POA: Diagnosis present

## 2015-02-05 DIAGNOSIS — I471 Supraventricular tachycardia: Secondary | ICD-10-CM | POA: Diagnosis present

## 2015-02-05 DIAGNOSIS — I2510 Atherosclerotic heart disease of native coronary artery without angina pectoris: Secondary | ICD-10-CM | POA: Diagnosis present

## 2015-02-05 DIAGNOSIS — I509 Heart failure, unspecified: Secondary | ICD-10-CM

## 2015-02-05 DIAGNOSIS — Z87891 Personal history of nicotine dependence: Secondary | ICD-10-CM

## 2015-02-05 DIAGNOSIS — Z825 Family history of asthma and other chronic lower respiratory diseases: Secondary | ICD-10-CM

## 2015-02-05 DIAGNOSIS — Z820 Family history of epilepsy and other diseases of the nervous system: Secondary | ICD-10-CM

## 2015-02-05 DIAGNOSIS — I252 Old myocardial infarction: Secondary | ICD-10-CM

## 2015-02-05 DIAGNOSIS — R251 Tremor, unspecified: Secondary | ICD-10-CM | POA: Diagnosis present

## 2015-02-05 LAB — URINALYSIS, ROUTINE W REFLEX MICROSCOPIC
Glucose, UA: NEGATIVE mg/dL
Ketones, ur: NEGATIVE mg/dL
Leukocytes, UA: NEGATIVE
Nitrite: NEGATIVE
Protein, ur: >300 mg/dL — AB
Specific Gravity, Urine: 1.025 (ref 1.005–1.030)
Urobilinogen, UA: 2 mg/dL — ABNORMAL HIGH (ref 0.0–1.0)
pH: 5 (ref 5.0–8.0)

## 2015-02-05 LAB — COMPREHENSIVE METABOLIC PANEL
ALT: 31 U/L (ref 0–53)
AST: 41 U/L — ABNORMAL HIGH (ref 0–37)
Albumin: 3.6 g/dL (ref 3.5–5.2)
Alkaline Phosphatase: 94 U/L (ref 39–117)
Anion gap: 8 (ref 5–15)
BUN: 29 mg/dL — ABNORMAL HIGH (ref 6–23)
CO2: 24 mmol/L (ref 19–32)
Calcium: 8.1 mg/dL — ABNORMAL LOW (ref 8.4–10.5)
Chloride: 100 mmol/L (ref 96–112)
Creatinine, Ser: 1.89 mg/dL — ABNORMAL HIGH (ref 0.50–1.35)
GFR calc Af Amer: 42 mL/min — ABNORMAL LOW (ref >90)
GFR calc non Af Amer: 36 mL/min — ABNORMAL LOW (ref >90)
Glucose, Bld: 238 mg/dL — ABNORMAL HIGH (ref 70–99)
Potassium: 4.1 mmol/L (ref 3.5–5.1)
Sodium: 132 mmol/L — ABNORMAL LOW (ref 135–145)
Total Bilirubin: 1.7 mg/dL — ABNORMAL HIGH (ref 0.3–1.2)
Total Protein: 7.7 g/dL (ref 6.0–8.3)

## 2015-02-05 LAB — CBC WITH DIFFERENTIAL/PLATELET
Basophils Absolute: 0 10*3/uL (ref 0.0–0.1)
Basophils Relative: 0 % (ref 0–1)
Eosinophils Absolute: 0 10*3/uL (ref 0.0–0.7)
Eosinophils Relative: 0 % (ref 0–5)
HCT: 41.3 % (ref 39.0–52.0)
Hemoglobin: 13.8 g/dL (ref 13.0–17.0)
Lymphocytes Relative: 5 % — ABNORMAL LOW (ref 12–46)
Lymphs Abs: 0.5 10*3/uL — ABNORMAL LOW (ref 0.7–4.0)
MCH: 29.8 pg (ref 26.0–34.0)
MCHC: 33.4 g/dL (ref 30.0–36.0)
MCV: 89.2 fL (ref 78.0–100.0)
Monocytes Absolute: 0.9 10*3/uL (ref 0.1–1.0)
Monocytes Relative: 9 % (ref 3–12)
Neutro Abs: 8.6 10*3/uL — ABNORMAL HIGH (ref 1.7–7.7)
Neutrophils Relative %: 86 % — ABNORMAL HIGH (ref 43–77)
Platelets: 181 10*3/uL (ref 150–400)
RBC: 4.63 MIL/uL (ref 4.22–5.81)
RDW: 14.3 % (ref 11.5–15.5)
WBC: 10 10*3/uL (ref 4.0–10.5)

## 2015-02-05 LAB — URINE MICROSCOPIC-ADD ON

## 2015-02-05 LAB — I-STAT CG4 LACTIC ACID, ED
Lactic Acid, Venous: 1.33 mmol/L (ref 0.5–2.0)
Lactic Acid, Venous: 0.76 mmol/L (ref 0.5–2.0)

## 2015-02-05 LAB — CBG MONITORING, ED
Glucose-Capillary: 221 mg/dL — ABNORMAL HIGH (ref 70–99)
Glucose-Capillary: 143 mg/dL — ABNORMAL HIGH (ref 70–99)

## 2015-02-05 MED ORDER — SODIUM CHLORIDE 0.9 % IV SOLN: Freq: Once | INTRAVENOUS | Status: DC

## 2015-02-05 MED ORDER — OSELTAMIVIR PHOSPHATE 30 MG PO CAPS
30.0000 mg | ORAL_CAPSULE | Freq: Two times a day (BID) | ORAL | Status: DC
  Administered 2015-02-05 – 2015-02-06 (×2): 30 mg via ORAL
  Filled 2015-02-05 (×3): qty 1

## 2015-02-05 MED ORDER — DEXTROSE 5 % IV SOLN
2.0000 g | Freq: Once | INTRAVENOUS | Status: AC
  Administered 2015-02-05: 2 g via INTRAVENOUS
  Filled 2015-02-05: qty 2

## 2015-02-05 MED ORDER — VANCOMYCIN HCL 10 G IV SOLR
1250.0000 mg | INTRAVENOUS | Status: DC
  Administered 2015-02-06 – 2015-02-08 (×3): 1250 mg via INTRAVENOUS
  Filled 2015-02-05 (×4): qty 1250

## 2015-02-05 MED ORDER — VANCOMYCIN HCL IN DEXTROSE 1-5 GM/200ML-% IV SOLN: 1000.0000 mg | Freq: Once | INTRAVENOUS | Status: DC

## 2015-02-05 MED ORDER — ACETAMINOPHEN 325 MG PO TABS
650.0000 mg | ORAL_TABLET | Freq: Four times a day (QID) | ORAL | Status: DC | PRN
  Filled 2015-02-05: qty 2

## 2015-02-05 MED ORDER — ACETAMINOPHEN 500 MG PO TABS
1000.0000 mg | ORAL_TABLET | Freq: Four times a day (QID) | ORAL | Status: DC | PRN
  Administered 2015-02-05: 975 mg via ORAL
  Filled 2015-02-05: qty 2

## 2015-02-05 MED ORDER — CEFEPIME HCL 2 G IJ SOLR
2.0000 g | INTRAMUSCULAR | Status: DC
  Administered 2015-02-06: 2 g via INTRAVENOUS
  Filled 2015-02-05 (×2): qty 2

## 2015-02-05 MED ORDER — SODIUM CHLORIDE 0.9 % IV BOLUS (SEPSIS)
1000.0000 mL | INTRAVENOUS | Status: DC
  Administered 2015-02-05 (×2): 1000 mL via INTRAVENOUS

## 2015-02-05 MED ORDER — VANCOMYCIN HCL 10 G IV SOLR
1500.0000 mg | INTRAVENOUS | Status: AC
  Administered 2015-02-05: 1500 mg via INTRAVENOUS
  Filled 2015-02-05: qty 1500

## 2015-02-05 NOTE — Progress Notes (Signed)
ANTIBIOTIC CONSULT NOTE - INITIAL  Pharmacy Consult for vancomycin and cefepime Indication: pneumonia  No Known Allergies  Patient Measurements: Height: 5\' 7"  (170.2 cm) Weight: 225 lb (102.059 kg) IBW/kg (Calculated) : 66.1   Vital Signs: Temp: 104 F (40 C) (04/15 1849) Temp Source: Rectal (04/15 1849) BP: 164/83 mmHg (04/15 1904) Pulse Rate: 104 (04/15 1904) Intake/Output from previous day:   Intake/Output from this shift:    Labs: No results for input(s): WBC, HGB, PLT, LABCREA, CREATININE in the last 72 hours. CrCl cannot be calculated (Patient has no serum creatinine result on file.). No results for input(s): VANCOTROUGH, VANCOPEAK, VANCORANDOM, GENTTROUGH, GENTPEAK, GENTRANDOM, TOBRATROUGH, TOBRAPEAK, TOBRARND, AMIKACINPEAK, AMIKACINTROU, AMIKACIN in the last 72 hours.   Microbiology: No results found for this or any previous visit (from the past 720 hour(s)).  Medical History: Past Medical History  Diagnosis Date  . Hypertension   . Hyperlipidemia   . Coronary artery disease   . Swelling of left lower extremity     LLE; "happens often"  . Myocardial infarction 05/2012    "mild"  . Type II diabetes mellitus   . Gout   . Arthritis   . Cancer     Assessment: Patient is 64 y.o M presented to the ED with c/o SOB and dizziness.  Tmax 104, wbc wnl, scr 1.89 (crcl~40).  To start vancomycin and cefepime for suspected PNA.  Patient received cefepime 2gm at 1917 and vancomycin 1500 mg at 1948 in the ED.  Goal of Therapy:  Vancomycin trough level 15-20 mcg/ml  Plan:  - vancomycin 1250mg  IV q24h - cefepime  2gm IV q24h  Gregory [name] P 02/05/2015,7:05 PM

## 2015-02-05 NOTE — ED Notes (Signed)
Awaiting arrival of Tamiflu from main pharmacy

## 2015-02-05 NOTE — ED Notes (Signed)
Lab called and made aware of need to send flu swabs to ED

## 2015-02-05 NOTE — ED Provider Notes (Signed)
CSN: [id number]     Arrival date & time 02/05/15  1805 History   First MD Initiated Contact with Patient 02/05/15 1810     Chief Complaint  Patient presents with  . Shortness of Breath  . Dizziness     (Consider location/radiation/quality/duration/timing/severity/associated sxs/prior Treatment) The history is provided by the patient and medical records. No language interpreter was used.     Gregory [name] is a 64 y.o. male  with a hx of IDDM, HTN, MI (with angioplasty and stent placement on 06/05/12), gout, arthritis presents to the Emergency Department complaining of gradual, persistent, progressively worsening SOB and generalized weakness onset 1 week ago. Patient reports he had worsening fatigue today. He reports he has been visiting his wife daily who lives in a skilled nursing facility and currently has pneumonia. He reports that he has been coughing more in the last few days. He presents to the emergency room with a family member who reports that he has been visually short of breath for the last 3 days continuing to worsen today. Patient reports he has been taking his insulin as directed but has not been checking his blood sugars.  He denies chest pain, abdominal pain, nausea, vomiting, diarrhea, syncope, dysuria.  Past Medical History  Diagnosis Date  . Hypertension   . Hyperlipidemia   . Coronary artery disease   . Swelling of left lower extremity     LLE; "happens often"  . Myocardial infarction 05/2012    "mild"  . Type II diabetes mellitus   . Gout   . Arthritis   . Cancer    Past Surgical History  Procedure Laterality Date  . Coronary angioplasty with stent placement  06/05/2012    "2; total of 2"  . Back surgery    . Lumbar disc surgery  1990's  . Robot assisted laparoscopic radical prostatectomy N/A 02/25/2014    Procedure: ROBOTIC ASSISTED LAPAROSCOPIC RADICAL  PROSTATECTOMY,  UMBILICAL HERNIA REPAIR;  Surgeon: Molli Hazard, MD;  Location: [facility];  Service:  Urology;  Laterality: N/A;  . Lymphadenectomy Bilateral 02/25/2014    Procedure: [facility] LYMPH NODE DISSECTION";  Surgeon: Molli Hazard, MD;  Location: [facility];  Service: Urology;  Laterality: Bilateral;  . Prostate surgery    . Left heart catheterization with coronary angiogram N/A 06/05/2012    Procedure: LEFT HEART CATHETERIZATION WITH CORONARY ANGIOGRAM;  Surgeon: Jettie Booze, MD;  Location: [facility] CATH LAB;  Service: Cardiovascular;  Laterality: N/A;  possible PCI   Family History  Problem Relation Age of Onset  . Breast cancer Mother   . Asthma Father   . Parkinson's disease Father    History  Substance Use Topics  . Smoking status: Former Smoker -- 2.00 packs/day for 30 years    Types: Cigarettes    Quit date: 10/23/1993  . Smokeless tobacco: Never Used  . Alcohol Use: No    Review of Systems  Constitutional: Positive for fever. Negative for diaphoresis, appetite change, fatigue and unexpected weight change.  HENT: Negative for mouth sores.   Eyes: Negative for visual disturbance.  Respiratory: Positive for shortness of breath. Negative for cough, chest tightness and wheezing.   Cardiovascular: Negative for chest pain.  Gastrointestinal: Negative for nausea, vomiting, abdominal pain, diarrhea and constipation.  Endocrine: Negative for polydipsia, polyphagia and polyuria.  Genitourinary: Negative for dysuria, urgency, frequency and hematuria.  Musculoskeletal: Negative for back pain and neck stiffness.  Skin: Negative for rash.  Allergic/Immunologic: Negative for immunocompromised state.  Neurological: Positive for weakness. Negative for syncope, light-headedness and headaches.  Hematological: Does not bruise/bleed easily.  Psychiatric/Behavioral: Negative for sleep disturbance. The patient is not nervous/anxious.       Allergies  Review of patient's allergies indicates no known allergies.  Home Medications   Prior to Admission medications    Medication Sig Start Date End Date Taking? Authorizing Provider  allopurinol (ZYLOPRIM) 100 MG tablet Take 300 mg by mouth daily as needed (gout).     Historical Provider, MD  aspirin-acetaminophen-caffeine (EXCEDRIN MIGRAINE) [phone] MG per tablet Take 1 tablet by mouth every 6 (six) hours as needed for headache or migraine (migraine).   Yes Historical Provider, MD  clopidogrel (PLAVIX) 75 MG tablet TAKE 1 TABLET (75 MG TOTAL) BY MOUTH DAILY. 10/22/14  Yes Jettie Booze, MD  insulin glargine (LANTUS) 100 UNIT/ML injection Inject 25 Units into the skin every morning.    Yes Historical Provider, MD  metoprolol tartrate (LOPRESSOR) 25 MG tablet TAKE 1/2 TABLET BY MOUTH TWICE DAILY 11/12/14  Yes Jettie Booze, MD  nitroGLYCERIN (NITROSTAT) 0.4 MG SL tablet Place 0.4 mg under the tongue every 5 (five) minutes as needed for chest pain.    Historical Provider, MD   BP 147/68 mmHg  Pulse 102  Temp(Src) 99.7 F (37.6 C) (Oral)  Resp 27  Ht 5\' 7"  (1.702 m)  Wt 225 lb (102.059 kg)  BMI 35.23 kg/m2  SpO2 97% Physical Exam  Constitutional: He appears well-developed and well-nourished. No distress.  Awake, alert, nontoxic appearance  HENT:  Head: Normocephalic and atraumatic.  Mouth/Throat: Oropharynx is clear and moist. No oropharyngeal exudate.  Eyes: Conjunctivae are normal. No scleral icterus.  Neck: Normal range of motion. Neck supple.  Cardiovascular: Regular rhythm, normal heart sounds and intact distal pulses.  Tachycardia present.   Pulses:      Radial pulses are 2+ on the right side, and 2+ on the left side.       Dorsalis pedis pulses are 2+ on the right side, and 2+ on the left side.  Capillary refill less than 3 seconds  Pulmonary/Chest: Effort normal. Tachypnea noted. No respiratory distress. He has decreased breath sounds.  Equal chest expansion Decreased lung sounds bilaterally in the lower lobes with crackles in the right base Patient tachypneic    Abdominal:  Soft. Bowel sounds are normal. He exhibits no mass. There is no tenderness. There is no rebound and no guarding.  Musculoskeletal: Normal range of motion. He exhibits no edema.  Neurological: He is alert.  Speech is clear and goal oriented Moves extremities without ataxia  Skin: Skin is warm and dry. He is not diaphoretic.  Psychiatric: He has a normal mood and affect.  Nursing note and vitals reviewed.   ED Course  Procedures (including critical care time) Labs Review Labs Reviewed  CBC WITH DIFFERENTIAL/PLATELET - Abnormal; Notable for the following:    Neutrophils Relative % 86 (*)    Neutro Abs 8.6 (*)    Lymphocytes Relative 5 (*)    Lymphs Abs 0.5 (*)    All other components within normal limits  COMPREHENSIVE METABOLIC PANEL - Abnormal; Notable for the following:    Sodium 132 (*)    Glucose, Bld 238 (*)    BUN 29 (*)    Creatinine, Ser 1.89 (*)    Calcium 8.1 (*)    AST 41 (*)    Total Bilirubin 1.7 (*)    GFR calc non Af Amer 36 (*)  GFR calc Af Amer 42 (*)    All other components within normal limits  URINALYSIS, ROUTINE W REFLEX MICROSCOPIC - Abnormal; Notable for the following:    Color, Urine AMBER (*)    APPearance TURBID (*)    Hgb urine dipstick LARGE (*)    Bilirubin Urine SMALL (*)    Protein, ur >300 (*)    Urobilinogen, UA 2.0 (*)    All other components within normal limits  URINE MICROSCOPIC-ADD ON - Abnormal; Notable for the following:    Bacteria, UA FEW (*)    Casts GRANULAR CAST (*)    All other components within normal limits  CBG MONITORING, ED - Abnormal; Notable for the following:    Glucose-Capillary 221 (*)    All other components within normal limits  CULTURE, BLOOD (ROUTINE X 2)  CULTURE, BLOOD (ROUTINE X 2)  URINE CULTURE  INFLUENZA PANEL BY PCR (TYPE A & B, H1N1)  I-STAT CG4 LACTIC ACID, ED  I-STAT CG4 LACTIC ACID, ED  I-STAT CG4 LACTIC ACID, ED    Imaging Review Dg Chest Port 1 View  (if Code Sepsis Called)  02/05/2015    CLINICAL DATA:  Shortness of breath. Dizziness. Shortness of breath for 2 days. Dizziness is today. History of diabetes.  EXAM: PORTABLE CHEST - 1 VIEW  COMPARISON:  12/15/2013  FINDINGS: Cardiac silhouette normal in size configuration. No mediastinal or hilar masses. Lungs are clear. No pleural effusion or pneumothorax.  Bony thorax is intact.  IMPRESSION: No acute cardiopulmonary disease.   Electronically Signed   By: Lajean Manes M.D.   On: 02/05/2015 19:15     EKG Interpretation None      MDM   Final diagnoses:  SIRS (systemic inflammatory response syndrome)  Fever, unspecified fever cause  SOB (shortness of breath)  Essential hypertension  Diabetes mellitus type 2, uncontrolled   Gregory [name] presents significantly tachycardic into the 140s, with tachypnea approximately 30 breaths per minute and febrile to 104.  Patient with exposure to pneumonia, cough and focal lung sounds. Will begin sepsis workup, fluid bolus and antibiotics for pneumonia. Labs, chest x-ray pending.  He has no altered mentation or hypotension at this time.  qSOFA - not high-risk  7:58 PM Patient with improving heart rate, approximately 118 at this time.  Chest x-ray is without evidence of pneumonia. Her blood cell count 10. Lactic acid within normal limits. Will decrease fluid boluses as patient has a history of CAD. Influenza pending.  9:42 PM An and source of patient's SIRS, will begin Tamiflu, renally dosed.  Discussed my concerns with patient who is amenable to admission. Vitals are improving. Blood cultures pending. No evidence of urinate tract infection. No evidence of DKA.  Discussed with Dr. Darnell Level who will admit for overnight monitoring.  BP 147/68 mmHg  Pulse 102  Temp(Src) 99.7 F (37.6 C) (Oral)  Resp 27  Ht 5\' 7"  (1.702 m)  Wt 225 lb (102.059 kg)  BMI 35.23 kg/m2  SpO2 97%  The patient was discussed with and seen by Dr. Tamera Punt who agrees with the treatment plan.   Jarrett Soho  Clanton Emanuelson, PA-C 02/05/15 2144  Malvin Johns, MD 02/06/15 [phone]

## 2015-02-05 NOTE — ED Notes (Signed)
Patient is trying to urinate 

## 2015-02-05 NOTE — ED Notes (Signed)
Pt stated that about 1 week ago he began to experience SOB, SOB symptom worsened today. When standing pt reports experiencing dizziness.

## 2015-02-05 NOTE — H&P (Signed)
PCP:  Kandice Hams, MD  Cardiology Dr. [facility]  Chief Complaint: Shortness of breath and cough  HPI: Gregory [name] is a 64 y.o. male   has a past medical history of Hypertension; Hyperlipidemia; Coronary artery disease; Swelling of left lower extremity; Myocardial infarction (05/2012); Type II diabetes mellitus; Gout; Arthritis; and Cancer.   Presented with  1 week history of worsening shortness of breath, coughing and lightheadedness. Patient is concerned because he was visiting his wife at the nursing facility who is currently dealing with pneumonia. Patient has been gradually getting worse in the past 3 days. He reports poor PO intake. Feels lightheaded when he sits up.  Denied any chest pain no nausea vomiting no diarrhea no syncope. On arrival to emerge department he was noted to be febrile up to 104. Patient was noted to be acute tachycardic as high as 118. Tachypneic up to  41. Patient met sepsis criteria. He was started on broad-spectrum antibiotics and aggressive IV fluid resuscitation. He was given tamiflu. Of note patient has not been hypotensive. Lactic acid was noted to be normal. White blood cell count was 10 UA showed no evidence of UTI. Influenza swab has been ordered. Chest x-ray showed no evidence of pneumonia. He is unsure if he had flu shot. Denies any travel history.  Of note patient has been out of his plavix and allopurinol for the past 1 month.   spitalist was called for admission for SIRS  Review of Systems:    Pertinent positives include:  Fevers, chills, fatigue, shortness of breath at rest.   dyspnea on exertion,  excess mucus,  productive cough,   Constitutional:  No weight loss, night sweats, weight loss  HEENT:  No headaches, Difficulty swallowing,Tooth/dental problems,Sore throat,  No sneezing, itching, ear ache, nasal congestion, post nasal drip,  Cardio-vascular:  No chest pain, Orthopnea, PND, anasarca, dizziness, palpitations.no  Bilateral lower extremity swelling  GI:  No heartburn, indigestion, abdominal pain, nausea, vomiting, diarrhea, change in bowel habits, loss of appetite, melena, blood in stool, hematemesis Resp:  noNo non-productive cough, No coughing up of blood.No change in color of mucus.No wheezing. Skin:  no rash or lesions. No jaundice GU:  no dysuria, change in color of urine, no urgency or frequency. No straining to urinate.  No flank pain.  Musculoskeletal:  No joint pain or no joint swelling. No decreased range of motion. No back pain.  Psych:  No change in mood or affect. No depression or anxiety. No memory loss.  Neuro: no localizing neurological complaints, no tingling, no weakness, no double vision, no gait abnormality, no slurred speech, no confusion  Otherwise ROS are negative except for above, 10 systems were reviewed  Past Medical History: Past Medical History  Diagnosis Date  . Hypertension   . Hyperlipidemia   . Coronary artery disease   . Swelling of left lower extremity     LLE; "happens often"  . Myocardial infarction 05/2012    "mild"  . Type II diabetes mellitus   . Gout   . Arthritis   . Cancer    Past Surgical History  Procedure Laterality Date  . Coronary angioplasty with stent placement  06/05/2012    "2; total of 2"  . Back surgery    . Lumbar disc surgery  1990's  . Robot assisted laparoscopic radical prostatectomy N/A 02/25/2014    Procedure: ROBOTIC ASSISTED LAPAROSCOPIC RADICAL  PROSTATECTOMY,  UMBILICAL HERNIA REPAIR;  Surgeon: Molli Hazard, MD;  Location:  [facility];  Service: Urology;  Laterality: N/A;  . Lymphadenectomy Bilateral 02/25/2014    Procedure: [facility] LYMPH NODE DISSECTION";  Surgeon: Molli Hazard, MD;  Location: [facility];  Service: Urology;  Laterality: Bilateral;  . Prostate surgery    . Left heart catheterization with coronary angiogram N/A 06/05/2012    Procedure: LEFT HEART CATHETERIZATION WITH CORONARY ANGIOGRAM;   Surgeon: Jettie Booze, MD;  Location: [facility] CATH LAB;  Service: Cardiovascular;  Laterality: N/A;  possible PCI     Medications: Prior to Admission medications   Medication Sig Start Date End Date Taking? Authorizing Provider  allopurinol (ZYLOPRIM) 100 MG tablet Take 300 mg by mouth daily as needed (gout).     Historical Provider, MD  aspirin-acetaminophen-caffeine (EXCEDRIN MIGRAINE) [phone] MG per tablet Take 1 tablet by mouth every 6 (six) hours as needed for headache or migraine (migraine).   Yes Historical Provider, MD  clopidogrel (PLAVIX) 75 MG tablet TAKE 1 TABLET (75 MG TOTAL) BY MOUTH DAILY. 10/22/14  Yes Jettie Booze, MD  insulin glargine (LANTUS) 100 UNIT/ML injection Inject 25 Units into the skin every morning.    Yes Historical Provider, MD  metoprolol tartrate (LOPRESSOR) 25 MG tablet TAKE 1/2 TABLET BY MOUTH TWICE DAILY 11/12/14  Yes Jettie Booze, MD  nitroGLYCERIN (NITROSTAT) 0.4 MG SL tablet Place 0.4 mg under the tongue every 5 (five) minutes as needed for chest pain.    Historical Provider, MD    Allergies:  No Known Allergies  Social History:  Ambulatory independently Lives at home alone,         reports that he quit smoking about 21 years ago. His smoking use included Cigarettes. He has a 60 pack-year smoking history. He has never used smokeless tobacco. He reports that he does not drink alcohol or use illicit drugs.    Family History: family history includes Asthma in his father; Breast cancer in his mother; Parkinson's disease in his father.    Physical Exam: Patient Vitals for the past 24 hrs:  BP Temp Temp src Pulse Resp SpO2 Height Weight  02/05/15 2100 147/68 mmHg - - 102 (!) 27 97 % - -  02/05/15 2040 - 99.7 F (37.6 C) Oral - - - - -  02/05/15 2030 145/75 mmHg - - 108 17 97 % - -  02/05/15 2000 152/73 mmHg - - 118 23 94 % - -  02/05/15 1952 158/88 mmHg - - 114 (!) 41 95 % - -  02/05/15 1949 - - - - - 96 % - -  02/05/15 1904  164/83 mmHg - - 104 (!) 29 94 % - -  02/05/15 1849 - (!) 104 F (40 C) Rectal - - - - -  02/05/15 1839 - - - - - - - 102.059 kg (225 lb)  02/05/15 1815 - 102 F (38.9 C) Oral - (!) 28 (!) 87 % $Re'5\' 7"'Ybj$  (1.702 m) 99.791 kg (220 lb)    1. General:  in No Acute distress 2. Psychological: Alert and  Oriented 3. Head/ENT:    Dry Mucous Membranes                          Head Non traumatic, neck supple                          Normal  Dentition 4. SKIN:   decreased Skin turgor,  Skin clean Dry abrasion to  the right elbow 5. Heart: Regular rate and rhythm no Murmur, Rub or gallop 6. Lungs:  no wheezes or crackles  slightly bronchial sounds at the bases 7. Abdomen: Soft, non-tender, Non distended 8. Lower extremities: no clubbing, cyanosis, left lower extremity enlarged patient states this is chronic and unchanged since his Achilles tendon rupture 9. Neurologically Grossly intact, moving all 4 extremities equally 10. MSK: Normal range of motion  body mass index is 35.23 kg/(m^2).   Labs on Admission:   Results for orders placed or performed during the hospital encounter of 02/05/15 (from the past 24 hour(s))  CBG monitoring, ED     Status: Abnormal   Collection Time: 02/05/15  6:33 PM  Result Value Ref Range   Glucose-Capillary 221 (H) 70 - 99 mg/dL  CBC WITH DIFFERENTIAL     Status: Abnormal   Collection Time: 02/05/15  6:45 PM  Result Value Ref Range   WBC 10.0 4.0 - 10.5 K/uL   RBC 4.63 4.22 - 5.81 MIL/uL   Hemoglobin 13.8 13.0 - 17.0 g/dL   HCT 41.3 39.0 - 52.0 %   MCV 89.2 78.0 - 100.0 fL   MCH 29.8 26.0 - 34.0 pg   MCHC 33.4 30.0 - 36.0 g/dL   RDW 14.3 11.5 - 15.5 %   Platelets 181 150 - 400 K/uL   Neutrophils Relative % 86 (H) 43 - 77 %   Neutro Abs 8.6 (H) 1.7 - 7.7 K/uL   Lymphocytes Relative 5 (L) 12 - 46 %   Lymphs Abs 0.5 (L) 0.7 - 4.0 K/uL   Monocytes Relative 9 3 - 12 %   Monocytes Absolute 0.9 0.1 - 1.0 K/uL   Eosinophils Relative 0 0 - 5 %   Eosinophils Absolute  0.0 0.0 - 0.7 K/uL   Basophils Relative 0 0 - 1 %   Basophils Absolute 0.0 0.0 - 0.1 K/uL  Comprehensive metabolic panel     Status: Abnormal   Collection Time: 02/05/15  6:45 PM  Result Value Ref Range   Sodium 132 (L) 135 - 145 mmol/L   Potassium 4.1 3.5 - 5.1 mmol/L   Chloride 100 96 - 112 mmol/L   CO2 24 19 - 32 mmol/L   Glucose, Bld 238 (H) 70 - 99 mg/dL   BUN 29 (H) 6 - 23 mg/dL   Creatinine, Ser 1.89 (H) 0.50 - 1.35 mg/dL   Calcium 8.1 (L) 8.4 - 10.5 mg/dL   Total Protein 7.7 6.0 - 8.3 g/dL   Albumin 3.6 3.5 - 5.2 g/dL   AST 41 (H) 0 - 37 U/L   ALT 31 0 - 53 U/L   Alkaline Phosphatase 94 39 - 117 U/L   Total Bilirubin 1.7 (H) 0.3 - 1.2 mg/dL   GFR calc non Af Amer 36 (L) >90 mL/min   GFR calc Af Amer 42 (L) >90 mL/min   Anion gap 8 5 - 15  Urinalysis, Routine w reflex microscopic     Status: Abnormal   Collection Time: 02/05/15  6:52 PM  Result Value Ref Range   Color, Urine AMBER (A) YELLOW   APPearance TURBID (A) CLEAR   Specific Gravity, Urine 1.025 1.005 - 1.030   pH 5.0 5.0 - 8.0   Glucose, UA NEGATIVE NEGATIVE mg/dL   Hgb urine dipstick LARGE (A) NEGATIVE   Bilirubin Urine SMALL (A) NEGATIVE   Ketones, ur NEGATIVE NEGATIVE mg/dL   Protein, ur >300 (A) NEGATIVE mg/dL   Urobilinogen, UA 2.0 (H) 0.0 -  1.0 mg/dL   Nitrite NEGATIVE NEGATIVE   Leukocytes, UA NEGATIVE NEGATIVE  Urine microscopic-add on     Status: Abnormal   Collection Time: 02/05/15  6:52 PM  Result Value Ref Range   WBC, UA 0-2 <3 WBC/hpf   Bacteria, UA FEW (A) RARE   Casts GRANULAR CAST (A) NEGATIVE   Urine-Other AMORPHOUS URATES/PHOSPHATES   I-Stat CG4 Lactic Acid, ED     Status: None   Collection Time: 02/05/15  6:55 PM  Result Value Ref Range   Lactic Acid, Venous 1.33 0.5 - 2.0 mmol/L    UA no evidence of UTI proteinuria  Lab Results  Component Value Date   HGBA1C 6.6* 08/24/2014    Estimated Creatinine Clearance: 45 mL/min (by C-G formula based on Cr of 1.89).  BNP (last 3  results) No results for input(s): PROBNP in the last 8760 hours.  Other results:  I have pearsonaly reviewed this: ECG REPORT  Rate128  Rhythm:  sinus tachycardia  ST&T Change:  no ischemic changes    echogram from 2013 showing grade 2 diastolic heart failure   Filed Weights   02/05/15 1815 02/05/15 1839  Weight: 99.791 kg (220 lb) 102.059 kg (225 lb)     Cultures:    Component Value Date/Time   SDES BLOOD RIGHT HAND 08/22/2014 1757   SPECREQUEST BOTTLES DRAWN AEROBIC AND ANAEROBIC 5CC 08/22/2014 1757   CULT  08/22/2014 1757    NO GROWTH 5 DAYS Performed at [facility] 08/31/2014 FINAL 08/22/2014 1757     Radiological Exams on Admission: Dg Chest Port 1 View  (if Code Sepsis Called)  02/05/2015   CLINICAL DATA:  Shortness of breath. Dizziness. Shortness of breath for 2 days. Dizziness is today. History of diabetes.  EXAM: PORTABLE CHEST - 1 VIEW  COMPARISON:  12/15/2013  FINDINGS: Cardiac silhouette normal in size configuration. No mediastinal or hilar masses. Lungs are clear. No pleural effusion or pneumothorax.  Bony thorax is intact.  IMPRESSION: No acute cardiopulmonary disease.   Electronically Signed   By: Lajean Manes M.D.   On: 02/05/2015 19:15    Chart has been reviewed  Assessment/Plan  External-year-old gentleman with history of hypertension, coronary artery disease, diabetes, chronic kidney insufficiency presents with shortness of breath and cough for past 1 week was found to be febrile up to 104 chest x-ray showed no evidence of pneumonia patient is being evaluated for possible influenza. Admitted for SIRS and acute on chronic renal failure  Present on Admission:   . SIRS (systemic inflammatory response syndrome) - most likely viral illness. Currently improving. For now we will continue broad-spectrum antibiotics until blood cultures have been negative for 48 hours. Continue Tamiflu influenza PCR pending  . Coronary atherosclerosis  of native coronary artery - patient has been off of his Plavix for past 1 month Will restart here needs to follow up his primary care doctor and cardiology to see if Plavix can be discontinued.  . Diabetes mellitus type 2, uncontrolled continue Lantus and sliding scale  . Acute on chronic renal failure likely second to poor by mouth intake and dehydration will give IV fluids and follow obtain urine electrolytes  . Chronic diastolic heart failure currently appears to be fluid down. Will give gentle IV fluids and monitor . HTN (hypertension) continue metoprolol  Prophylaxis:  Lovenox, Protonix  CODE STATUS:  FULL CODE    Other plan as per orders.  I have spent a total of 55 min on  this admission  Nikyah Lackman 02/05/2015, 9:45 PM  [facility]  Pager [phone]   after 2 AM please page floor coverage PA If 7AM-7PM, please contact the day team taking care of the patient  [email or web address]  Password [email or web address]

## 2015-02-06 ENCOUNTER — Inpatient Hospital Stay (HOSPITAL_COMMUNITY): Payer: 59

## 2015-02-06 DIAGNOSIS — J9601 Acute respiratory failure with hypoxia: Secondary | ICD-10-CM | POA: Diagnosis present

## 2015-02-06 DIAGNOSIS — N179 Acute kidney failure, unspecified: Secondary | ICD-10-CM | POA: Diagnosis present

## 2015-02-06 DIAGNOSIS — I471 Supraventricular tachycardia: Secondary | ICD-10-CM | POA: Diagnosis present

## 2015-02-06 DIAGNOSIS — M1000 Idiopathic gout, unspecified site: Secondary | ICD-10-CM | POA: Diagnosis present

## 2015-02-06 DIAGNOSIS — A419 Sepsis, unspecified organism: Secondary | ICD-10-CM

## 2015-02-06 DIAGNOSIS — Z87891 Personal history of nicotine dependence: Secondary | ICD-10-CM | POA: Diagnosis not present

## 2015-02-06 DIAGNOSIS — Z820 Family history of epilepsy and other diseases of the nervous system: Secondary | ICD-10-CM | POA: Diagnosis not present

## 2015-02-06 DIAGNOSIS — I10 Essential (primary) hypertension: Secondary | ICD-10-CM | POA: Diagnosis not present

## 2015-02-06 DIAGNOSIS — I252 Old myocardial infarction: Secondary | ICD-10-CM | POA: Diagnosis not present

## 2015-02-06 DIAGNOSIS — M7989 Other specified soft tissue disorders: Secondary | ICD-10-CM | POA: Diagnosis not present

## 2015-02-06 DIAGNOSIS — R17 Unspecified jaundice: Secondary | ICD-10-CM | POA: Diagnosis present

## 2015-02-06 DIAGNOSIS — I5033 Acute on chronic diastolic (congestive) heart failure: Secondary | ICD-10-CM | POA: Diagnosis present

## 2015-02-06 DIAGNOSIS — E1165 Type 2 diabetes mellitus with hyperglycemia: Secondary | ICD-10-CM | POA: Diagnosis present

## 2015-02-06 DIAGNOSIS — E785 Hyperlipidemia, unspecified: Secondary | ICD-10-CM | POA: Diagnosis present

## 2015-02-06 DIAGNOSIS — E860 Dehydration: Secondary | ICD-10-CM | POA: Diagnosis present

## 2015-02-06 DIAGNOSIS — I129 Hypertensive chronic kidney disease with stage 1 through stage 4 chronic kidney disease, or unspecified chronic kidney disease: Secondary | ICD-10-CM | POA: Diagnosis present

## 2015-02-06 DIAGNOSIS — R251 Tremor, unspecified: Secondary | ICD-10-CM | POA: Diagnosis present

## 2015-02-06 DIAGNOSIS — N189 Chronic kidney disease, unspecified: Secondary | ICD-10-CM | POA: Diagnosis not present

## 2015-02-06 DIAGNOSIS — J189 Pneumonia, unspecified organism: Secondary | ICD-10-CM | POA: Diagnosis present

## 2015-02-06 DIAGNOSIS — I5032 Chronic diastolic (congestive) heart failure: Secondary | ICD-10-CM | POA: Diagnosis not present

## 2015-02-06 DIAGNOSIS — N183 Chronic kidney disease, stage 3 (moderate): Secondary | ICD-10-CM | POA: Diagnosis present

## 2015-02-06 DIAGNOSIS — R0602 Shortness of breath: Secondary | ICD-10-CM | POA: Diagnosis present

## 2015-02-06 DIAGNOSIS — R0600 Dyspnea, unspecified: Secondary | ICD-10-CM | POA: Diagnosis not present

## 2015-02-06 DIAGNOSIS — I2510 Atherosclerotic heart disease of native coronary artery without angina pectoris: Secondary | ICD-10-CM | POA: Diagnosis present

## 2015-02-06 DIAGNOSIS — Z825 Family history of asthma and other chronic lower respiratory diseases: Secondary | ICD-10-CM | POA: Diagnosis not present

## 2015-02-06 DIAGNOSIS — Z794 Long term (current) use of insulin: Secondary | ICD-10-CM | POA: Diagnosis not present

## 2015-02-06 DIAGNOSIS — Z79899 Other long term (current) drug therapy: Secondary | ICD-10-CM | POA: Diagnosis not present

## 2015-02-06 DIAGNOSIS — Z803 Family history of malignant neoplasm of breast: Secondary | ICD-10-CM | POA: Diagnosis not present

## 2015-02-06 DIAGNOSIS — I472 Ventricular tachycardia: Secondary | ICD-10-CM | POA: Diagnosis present

## 2015-02-06 LAB — CBC
HCT: 38.5 % — ABNORMAL LOW (ref 39.0–52.0)
HCT: 38.3 % — ABNORMAL LOW (ref 39.0–52.0)
Hemoglobin: 13 g/dL (ref 13.0–17.0)
Hemoglobin: 12.4 g/dL — ABNORMAL LOW (ref 13.0–17.0)
MCH: 30.4 pg (ref 26.0–34.0)
MCH: 28.8 pg (ref 26.0–34.0)
MCHC: 33.8 g/dL (ref 30.0–36.0)
MCHC: 32.4 g/dL (ref 30.0–36.0)
MCV: 90 fL (ref 78.0–100.0)
MCV: 89.1 fL (ref 78.0–100.0)
Platelets: 171 10*3/uL (ref 150–400)
Platelets: 181 10*3/uL (ref 150–400)
RBC: 4.28 MIL/uL (ref 4.22–5.81)
RBC: 4.3 MIL/uL (ref 4.22–5.81)
RDW: 14.5 % (ref 11.5–15.5)
RDW: 14.6 % (ref 11.5–15.5)
WBC: 10.4 10*3/uL (ref 4.0–10.5)
WBC: 9.2 10*3/uL (ref 4.0–10.5)

## 2015-02-06 LAB — COMPREHENSIVE METABOLIC PANEL
ALT: 29 U/L (ref 0–53)
ALT: 35 U/L (ref 0–53)
AST: 38 U/L — ABNORMAL HIGH (ref 0–37)
AST: 45 U/L — ABNORMAL HIGH (ref 0–37)
Albumin: 3 g/dL — ABNORMAL LOW (ref 3.5–5.2)
Albumin: 2.9 g/dL — ABNORMAL LOW (ref 3.5–5.2)
Alkaline Phosphatase: 85 U/L (ref 39–117)
Alkaline Phosphatase: 108 U/L (ref 39–117)
Anion gap: 9 (ref 5–15)
Anion gap: 9 (ref 5–15)
BUN: 25 mg/dL — ABNORMAL HIGH (ref 6–23)
BUN: 23 mg/dL (ref 6–23)
CO2: 23 mmol/L (ref 19–32)
CO2: 23 mmol/L (ref 19–32)
Calcium: 7.7 mg/dL — ABNORMAL LOW (ref 8.4–10.5)
Calcium: 7.6 mg/dL — ABNORMAL LOW (ref 8.4–10.5)
Chloride: 104 mmol/L (ref 96–112)
Chloride: 106 mmol/L (ref 96–112)
Creatinine, Ser: 1.62 mg/dL — ABNORMAL HIGH (ref 0.50–1.35)
Creatinine, Ser: 1.58 mg/dL — ABNORMAL HIGH (ref 0.50–1.35)
GFR calc Af Amer: 50 mL/min — ABNORMAL LOW (ref >90)
GFR calc Af Amer: 52 mL/min — ABNORMAL LOW (ref >90)
GFR calc non Af Amer: 43 mL/min — ABNORMAL LOW (ref >90)
GFR calc non Af Amer: 45 mL/min — ABNORMAL LOW (ref >90)
Glucose, Bld: 163 mg/dL — ABNORMAL HIGH (ref 70–99)
Glucose, Bld: 159 mg/dL — ABNORMAL HIGH (ref 70–99)
Potassium: 4 mmol/L (ref 3.5–5.1)
Potassium: 3.8 mmol/L (ref 3.5–5.1)
Sodium: 136 mmol/L (ref 135–145)
Sodium: 138 mmol/L (ref 135–145)
Total Bilirubin: 1.4 mg/dL — ABNORMAL HIGH (ref 0.3–1.2)
Total Bilirubin: 1.4 mg/dL — ABNORMAL HIGH (ref 0.3–1.2)
Total Protein: 6.7 g/dL (ref 6.0–8.3)
Total Protein: 6.6 g/dL (ref 6.0–8.3)

## 2015-02-06 LAB — BLOOD GAS, ARTERIAL
Acid-Base Excess: 0.2 mmol/L (ref 0.0–2.0)
Bicarbonate: 23.7 mEq/L (ref 20.0–24.0)
Drawn by: 257701
O2 Content: 2 L/min
O2 Saturation: 90.6 %
Patient temperature: 98.6
TCO2: 21.2 mmol/L (ref 0–100)
pCO2 arterial: 36.5 mmHg (ref 35.0–45.0)
pH, Arterial: 7.428 (ref 7.350–7.450)
pO2, Arterial: 58.5 mmHg — ABNORMAL LOW (ref 80.0–100.0)

## 2015-02-06 LAB — CREATININE, URINE, RANDOM: Creatinine, Urine: 122.32 mg/dL

## 2015-02-06 LAB — BRAIN NATRIURETIC PEPTIDE: B Natriuretic Peptide: 91.2 pg/mL (ref 0.0–100.0)

## 2015-02-06 LAB — GLUCOSE, CAPILLARY
Glucose-Capillary: 146 mg/dL — ABNORMAL HIGH (ref 70–99)
Glucose-Capillary: 132 mg/dL — ABNORMAL HIGH (ref 70–99)
Glucose-Capillary: 168 mg/dL — ABNORMAL HIGH (ref 70–99)
Glucose-Capillary: 153 mg/dL — ABNORMAL HIGH (ref 70–99)

## 2015-02-06 LAB — PROCALCITONIN: Procalcitonin: 3.14 ng/mL

## 2015-02-06 LAB — INFLUENZA PANEL BY PCR (TYPE A & B)
H1N1 flu by pcr: NOT DETECTED
Influenza A By PCR: NEGATIVE
Influenza B By PCR: NEGATIVE

## 2015-02-06 LAB — TSH: TSH: 1.297 u[IU]/mL (ref 0.350–4.500)

## 2015-02-06 LAB — MAGNESIUM: Magnesium: 2.3 mg/dL (ref 1.5–2.5)

## 2015-02-06 LAB — LACTIC ACID, PLASMA
Lactic Acid, Venous: 1.2 mmol/L (ref 0.5–2.0)
Lactic Acid, Venous: 0.9 mmol/L (ref 0.5–2.0)
Lactic Acid, Venous: 1.5 mmol/L (ref 0.5–2.0)

## 2015-02-06 LAB — PHOSPHORUS: Phosphorus: 3.7 mg/dL (ref 2.3–4.6)

## 2015-02-06 LAB — SODIUM, URINE, RANDOM: Sodium, Ur: 69 mmol/L

## 2015-02-06 LAB — MRSA PCR SCREENING: MRSA by PCR: NEGATIVE

## 2015-02-06 MED ORDER — FUROSEMIDE 10 MG/ML IJ SOLN
INTRAMUSCULAR | Status: AC
  Filled 2015-02-06: qty 2

## 2015-02-06 MED ORDER — ACETAMINOPHEN 650 MG RE SUPP
650.0000 mg | Freq: Four times a day (QID) | RECTAL | Status: DC | PRN
  Administered 2015-02-07 – 2015-02-09 (×3): 650 mg via RECTAL
  Filled 2015-02-06 (×3): qty 1

## 2015-02-06 MED ORDER — INSULIN ASPART 100 UNIT/ML ~~LOC~~ SOLN: 0.0000 [IU] | Freq: Every day | SUBCUTANEOUS | Status: DC

## 2015-02-06 MED ORDER — HYDROCODONE-ACETAMINOPHEN 5-325 MG PO TABS
1.0000 | ORAL_TABLET | ORAL | Status: DC | PRN
  Administered 2015-02-06: 1 via ORAL
  Administered 2015-02-07 – 2015-02-11 (×6): 2 via ORAL
  Filled 2015-02-06 (×2): qty 2
  Filled 2015-02-06: qty 1
  Filled 2015-02-06 (×4): qty 2

## 2015-02-06 MED ORDER — LEVALBUTEROL HCL 0.63 MG/3ML IN NEBU
0.6300 mg | INHALATION_SOLUTION | Freq: Three times a day (TID) | RESPIRATORY_TRACT | Status: DC
  Administered 2015-02-06 – 2015-02-12 (×18): 0.63 mg via RESPIRATORY_TRACT
  Filled 2015-02-06 (×19): qty 3

## 2015-02-06 MED ORDER — SODIUM CHLORIDE 0.9 % IV SOLN
INTRAVENOUS | Status: AC
  Administered 2015-02-06: 02:00:00 via INTRAVENOUS

## 2015-02-06 MED ORDER — INSULIN GLARGINE 100 UNIT/ML ~~LOC~~ SOLN
25.0000 [IU] | Freq: Every morning | SUBCUTANEOUS | Status: DC
  Administered 2015-02-06 – 2015-02-07 (×2): 25 [IU] via SUBCUTANEOUS
  Filled 2015-02-06 (×2): qty 0.25

## 2015-02-06 MED ORDER — ONDANSETRON HCL 4 MG/2ML IJ SOLN: 4.0000 mg | Freq: Four times a day (QID) | INTRAMUSCULAR | Status: DC | PRN

## 2015-02-06 MED ORDER — IPRATROPIUM BROMIDE 0.02 % IN SOLN
0.5000 mg | Freq: Four times a day (QID) | RESPIRATORY_TRACT | Status: DC
  Administered 2015-02-06: 0.5 mg via RESPIRATORY_TRACT
  Filled 2015-02-06: qty 2.5

## 2015-02-06 MED ORDER — CLOPIDOGREL BISULFATE 75 MG PO TABS
75.0000 mg | ORAL_TABLET | Freq: Every day | ORAL | Status: DC
  Administered 2015-02-06 – 2015-02-13 (×8): 75 mg via ORAL
  Filled 2015-02-06 (×9): qty 1

## 2015-02-06 MED ORDER — ENOXAPARIN SODIUM 40 MG/0.4ML ~~LOC~~ SOLN
40.0000 mg | Freq: Every day | SUBCUTANEOUS | Status: DC
  Administered 2015-02-06 – 2015-02-08 (×3): 40 mg via SUBCUTANEOUS
  Filled 2015-02-06 (×3): qty 0.4

## 2015-02-06 MED ORDER — LEVALBUTEROL HCL 0.63 MG/3ML IN NEBU
0.6300 mg | INHALATION_SOLUTION | Freq: Four times a day (QID) | RESPIRATORY_TRACT | Status: DC | PRN
  Administered 2015-02-08 – 2015-02-09 (×2): 0.63 mg via RESPIRATORY_TRACT
  Filled 2015-02-06 (×2): qty 3

## 2015-02-06 MED ORDER — FUROSEMIDE 10 MG/ML IJ SOLN
INTRAMUSCULAR | Status: AC
  Administered 2015-02-06: 40 mg via INTRAVENOUS
  Filled 2015-02-06: qty 2

## 2015-02-06 MED ORDER — METOPROLOL TARTRATE 12.5 MG HALF TABLET
12.5000 mg | ORAL_TABLET | Freq: Two times a day (BID) | ORAL | Status: DC
  Administered 2015-02-06 – 2015-02-07 (×4): 12.5 mg via ORAL
  Filled 2015-02-06 (×4): qty 1

## 2015-02-06 MED ORDER — INSULIN ASPART 100 UNIT/ML ~~LOC~~ SOLN
0.0000 [IU] | Freq: Three times a day (TID) | SUBCUTANEOUS | Status: DC
  Administered 2015-02-06: 3 [IU] via SUBCUTANEOUS
  Administered 2015-02-06: 2 [IU] via SUBCUTANEOUS
  Administered 2015-02-06: 3 [IU] via SUBCUTANEOUS
  Administered 2015-02-07: 2 [IU] via SUBCUTANEOUS
  Administered 2015-02-07: 3 [IU] via SUBCUTANEOUS
  Administered 2015-02-08 (×2): 2 [IU] via SUBCUTANEOUS
  Administered 2015-02-09 (×2): 3 [IU] via SUBCUTANEOUS
  Administered 2015-02-09: 2 [IU] via SUBCUTANEOUS

## 2015-02-06 MED ORDER — SODIUM CHLORIDE 0.9 % IJ SOLN
3.0000 mL | Freq: Two times a day (BID) | INTRAMUSCULAR | Status: DC
  Administered 2015-02-06 – 2015-02-12 (×11): 3 mL via INTRAVENOUS

## 2015-02-06 MED ORDER — ONDANSETRON HCL 4 MG PO TABS: 4.0000 mg | ORAL_TABLET | Freq: Four times a day (QID) | ORAL | Status: DC | PRN

## 2015-02-06 MED ORDER — ACETAMINOPHEN 325 MG PO TABS
650.0000 mg | ORAL_TABLET | Freq: Four times a day (QID) | ORAL | Status: DC | PRN
  Administered 2015-02-06 – 2015-02-10 (×6): 650 mg via ORAL
  Filled 2015-02-06 (×6): qty 2

## 2015-02-06 MED ORDER — FUROSEMIDE 10 MG/ML IJ SOLN
40.0000 mg | INTRAMUSCULAR | Status: AC
  Administered 2015-02-06: 40 mg via INTRAVENOUS

## 2015-02-06 MED ORDER — IPRATROPIUM BROMIDE 0.02 % IN SOLN
0.5000 mg | Freq: Three times a day (TID) | RESPIRATORY_TRACT | Status: DC
  Administered 2015-02-06 – 2015-02-12 (×18): 0.5 mg via RESPIRATORY_TRACT
  Filled 2015-02-06 (×19): qty 2.5

## 2015-02-06 MED ORDER — GUAIFENESIN-DM 100-10 MG/5ML PO SYRP
5.0000 mL | ORAL_SOLUTION | ORAL | Status: DC | PRN
Start: 2015-02-06 — End: 2015-02-13
  Administered 2015-02-08: 5 mL via ORAL
  Filled 2015-02-06: qty 10

## 2015-02-06 MED ORDER — GUAIFENESIN ER 600 MG PO TB12
600.0000 mg | ORAL_TABLET | Freq: Two times a day (BID) | ORAL | Status: DC
  Administered 2015-02-06 – 2015-02-13 (×15): 600 mg via ORAL
  Filled 2015-02-06 (×16): qty 1

## 2015-02-06 NOTE — Progress Notes (Signed)
[name] daughter took his home meds back to his house. [name], Student RN and Lacinda Axon, RN.

## 2015-02-06 NOTE — Progress Notes (Signed)
Reported to physician changes in patient condition:  BIL arm/hand tremors, heart rate 140s when out of bed, fever 102.9, changes in lung sounds.  MD transferring pt to stepdown.

## 2015-02-06 NOTE — Progress Notes (Signed)
Transferred patient to Room 1239.  Camera operator and myself transporting.  2L [place].  IVs SL.  Arrived to Room 1239 in ICU.

## 2015-02-06 NOTE — Progress Notes (Signed)
PROGRESS NOTE  Gregory [name] HFW:[id number] DOB: 22-Nov-1950 DOA: 02/05/2015 PCP: Kandice Hams, MD  64 year old male 64 year old male with a history of diabetes mellitus, prostate cancer hypertension, coronary artery disease, gouty arthritis presents with one-week history of progressive shortness of breath, coughing, or lightheadedness. The patient had a fever of 104.1F in the emergency department with associated tachycardia, tachypnea and hypoxemia. The patient was admitted for sepsis workup. Working diagnosis was pneumonia, and the patient was started on vancomycin and cefepime. Influenza PCR was negative, but respiratory viral panel was obtained. During the course of the day on 02/06/2015, the patient developed progressive dyspnea. Repeat chest x-ray showed increase in interstitial thickening particularly at the lung bases. BNP was 91 and repeat lactic acid was 1.2. The patient was clinical stable when he was initially seen at 0900, but he developed progressive dyspnea prompting transfer to the stepdown unit. I reevaluated the patient at 1545. He denied any chest pain but complained of shortness of breath. He has also had 2 loose stools during the course of the day. C. difficile PCR was sent. He denied any vomiting, abdominal pain, dysuria, hematuria.  Assessment/Plan: Acute respiratory failure -Concerns for pulmonary edema versus interstitial pneumonitis -Continue broad-spectrum IV antibiotics -02/06/2015 chest x-ray shows increasing interstitial thickening -We'll give furosemide 40 mg IV 1 -ABG -Chek procalcitonin -Repeat CMP and CBC -Xopenex and Atrovent nebs Sepsis -present at the time of admission -concerned about pneumonia -Repeat lactic acid, check procalcitonin -Continue IV vancomycin and cefepime -Influenza PCR negative--> discontinue oseltamivir  Acute on chronic renal failure (CKD 2-3) -Secondary to sepsis  -Initially some improvement with fluid hydration,  but now concerned about fluid overload  -Monitor renal function closely  Diabetes mellitus type 2  -Continue Lantus 25 units  -NovoLog sliding scale  Coronary artery disease  -Continue metoprolol tartrate  -Continue Plavix hypertension  -Continue metoprolol tartrate  -I expect improvement with diuresis  Tremor -resting tremor -pt can suppress on command -suspect a component of shivering and rigors as well as worsening due to acute medical ilnness    Family Communication:   Sister updated at beside Disposition Plan:   Transfer to stepdown      Procedures/Studies: Dg Chest Port 1 View  02/06/2015   CLINICAL DATA:  Worsening dyspnea and fever. Short of breath today. History of hypertension.  EXAM: PORTABLE CHEST - 1 VIEW  COMPARISON:  02/05/2015.  FINDINGS: Since prior exam, bilateral interstitial thickening has increased. Mild hazy airspace opacity is noted in the medial right lung base and lateral left lung base. There is no convincing pleural effusion and no pneumothorax.  Cardiac silhouette is mildly enlarged. No mediastinal or hilar masses.  Bony thorax is grossly intact.  IMPRESSION: 1. Increased interstitial thickening when compared the prior study and with mild hazy airspace opacity at the lung bases. Findings could reflect pulmonary edema. However, given the history of fever, diffuse interstitial pneumonia is suspected.   Electronically Signed   By: Lajean Manes M.D.   On: 02/06/2015 09:27   Dg Chest Port 1 View  (if Code Sepsis Called)  02/05/2015   CLINICAL DATA:  Shortness of breath. Dizziness. Shortness of breath for 2 days. Dizziness is today. History of diabetes.  EXAM: PORTABLE CHEST - 1 VIEW  COMPARISON:  12/15/2013  FINDINGS: Cardiac silhouette normal in size configuration. No mediastinal or hilar masses. Lungs are clear. No pleural effusion or pneumothorax.  Bony thorax is intact.  IMPRESSION: No acute  cardiopulmonary disease.   Electronically Signed   By: Lajean Manes  M.D.   On: 02/05/2015 19:15         Subjective: Patient complaint fevers and chills. He has shortness of breath but denies any chest pain, vomiting, abdominal pain, dysuria, hematochezia, melena, headache.   Objective: Filed Vitals:   02/06/15 0553 02/06/15 0918 02/06/15 1000 02/06/15 1429  BP: 160/82  155/70 161/96  Pulse: 94  95 130  Temp: 99.8 F (37.7 C)  99 F (37.2 C) 102.9 F (39.4 C)  TempSrc: Oral  Oral Oral  Resp: 18  20 22   Height:      Weight:      SpO2: 95% 88% 92% 93%    Intake/Output Summary (Last 24 hours) at 02/06/15 1611 Last data filed at 02/06/15 1300  Gross per 24 hour  Intake   3700 ml  Output    900 ml  Net   2800 ml   Weight change:  Exam:   General:  Pt is alert, follows commands appropriately, not in acute distress  HEENT: No icterus, No thrush,  [place]/AT  Cardiovascular: RRR, S1/S2, no rubs, no gallops  Respiratory: bilateral rales. No wheeze.  Abdomen: Soft/+BS, non tender, non distended, no guarding  Extremities: trace LE edema, No lymphangitis, No petechiae, No rashes, no synovitis  Data Reviewed: Basic Metabolic Panel:  Recent Labs Lab 02/05/15 1845 02/06/15 0533  NA 132* 136  K 4.1 4.0  CL 100 104  CO2 24 23  GLUCOSE 238* 163*  BUN 29* 25*  CREATININE 1.89* 1.62*  CALCIUM 8.1* 7.7*  MG  --  2.3  PHOS  --  3.7   Liver Function Tests:  Recent Labs Lab 02/05/15 1845 02/06/15 0533  AST 41* 38*  ALT 31 29  ALKPHOS 94 85  BILITOT 1.7* 1.4*  PROT 7.7 6.7  ALBUMIN 3.6 3.0*   No results for input(s): LIPASE, AMYLASE in the last 168 hours. No results for input(s): AMMONIA in the last 168 hours. CBC:  Recent Labs Lab 02/05/15 1845 02/06/15 0533  WBC 10.0 10.4  NEUTROABS 8.6*  --   HGB 13.8 13.0  HCT 41.3 38.5*  MCV 89.2 90.0  PLT 181 171   Cardiac Enzymes: No results for input(s): CKTOTAL, CKMB, CKMBINDEX, TROPONINI in the last 168 hours. BNP: Invalid input(s): POCBNP CBG:  Recent Labs Lab  02/05/15 2313 02/06/15 0149 02/06/15 0742 02/06/15 1109 02/06/15 1557  GLUCAP 143* 146* 132* 168* 153*    No results found for this or any previous visit (from the past 240 hour(s)).   Scheduled Meds: . furosemide      . ceFEPime (MAXIPIME) IV  2 g Intravenous Q24H  . clopidogrel  75 mg Oral Daily  . enoxaparin (LOVENOX) injection  40 mg Subcutaneous Daily  . guaiFENesin  600 mg Oral BID  . insulin aspart  0-15 Units Subcutaneous TID WC  . insulin aspart  0-5 Units Subcutaneous QHS  . insulin glargine  25 Units Subcutaneous q morning - 10a  . ipratropium  0.5 mg Nebulization TID  . levalbuterol  0.63 mg Nebulization TID  . metoprolol tartrate  12.5 mg Oral BID  . sodium chloride  3 mL Intravenous Q12H  . vancomycin  1,250 mg Intravenous Q24H   Continuous Infusions:    Ankit Degregorio, DO  [facility] Pager [phone]  If 7PM-7AM, please contact night-coverage [email or web address] Password [email or web address] 02/06/2015, 4:11 PM   LOS: 1 day

## 2015-02-06 NOTE — Progress Notes (Signed)
Pt stated he had 2-3 loose stools. Patient placed on c-diff protocol - c-diff PCR and enteric precautions ordered.

## 2015-02-06 NOTE — Progress Notes (Signed)
Called report to [place], ICU, X [phone].  Patient being transferred to Room 1239.

## 2015-02-07 ENCOUNTER — Encounter (HOSPITAL_COMMUNITY): Payer: Self-pay | Admitting: Radiology

## 2015-02-07 ENCOUNTER — Inpatient Hospital Stay (HOSPITAL_COMMUNITY): Payer: 59

## 2015-02-07 DIAGNOSIS — R0600 Dyspnea, unspecified: Secondary | ICD-10-CM

## 2015-02-07 LAB — BLOOD GAS, ARTERIAL
Acid-Base Excess: 2.1 mmol/L — ABNORMAL HIGH (ref 0.0–2.0)
Bicarbonate: 25.6 mEq/L — ABNORMAL HIGH (ref 20.0–24.0)
Drawn by: 276051
O2 Content: 6 L/min
O2 Saturation: 94 %
Patient temperature: 101
TCO2: 22.6 mmol/L (ref 0–100)
pCO2 arterial: 40.1 mmHg (ref 35.0–45.0)
pH, Arterial: 7.428 (ref 7.350–7.450)
pO2, Arterial: 74.8 mmHg — ABNORMAL LOW (ref 80.0–100.0)

## 2015-02-07 LAB — GLUCOSE, CAPILLARY
Glucose-Capillary: 134 mg/dL — ABNORMAL HIGH (ref 70–99)
Glucose-Capillary: 170 mg/dL — ABNORMAL HIGH (ref 70–99)
Glucose-Capillary: 141 mg/dL — ABNORMAL HIGH (ref 70–99)
Glucose-Capillary: 144 mg/dL — ABNORMAL HIGH (ref 70–99)
Glucose-Capillary: 147 mg/dL — ABNORMAL HIGH (ref 70–99)
Glucose-Capillary: 116 mg/dL — ABNORMAL HIGH (ref 70–99)

## 2015-02-07 LAB — URINALYSIS, ROUTINE W REFLEX MICROSCOPIC
Bilirubin Urine: NEGATIVE
Glucose, UA: NEGATIVE mg/dL
Ketones, ur: NEGATIVE mg/dL
Leukocytes, UA: NEGATIVE
Nitrite: NEGATIVE
Protein, ur: 30 mg/dL — AB
Specific Gravity, Urine: 1.01 (ref 1.005–1.030)
Urobilinogen, UA: 1 mg/dL (ref 0.0–1.0)
pH: 5 (ref 5.0–8.0)

## 2015-02-07 LAB — COMPREHENSIVE METABOLIC PANEL
ALT: 42 U/L (ref 0–53)
AST: 53 U/L — ABNORMAL HIGH (ref 0–37)
Albumin: 2.8 g/dL — ABNORMAL LOW (ref 3.5–5.2)
Alkaline Phosphatase: 132 U/L — ABNORMAL HIGH (ref 39–117)
Anion gap: 8 (ref 5–15)
BUN: 27 mg/dL — ABNORMAL HIGH (ref 6–23)
CO2: 25 mmol/L (ref 19–32)
Calcium: 7.6 mg/dL — ABNORMAL LOW (ref 8.4–10.5)
Chloride: 102 mmol/L (ref 96–112)
Creatinine, Ser: 1.72 mg/dL — ABNORMAL HIGH (ref 0.50–1.35)
GFR calc Af Amer: 47 mL/min — ABNORMAL LOW (ref >90)
GFR calc non Af Amer: 40 mL/min — ABNORMAL LOW (ref >90)
Glucose, Bld: 248 mg/dL — ABNORMAL HIGH (ref 70–99)
Potassium: 4.1 mmol/L (ref 3.5–5.1)
Sodium: 135 mmol/L (ref 135–145)
Total Bilirubin: 1.3 mg/dL — ABNORMAL HIGH (ref 0.3–1.2)
Total Protein: 6.6 g/dL (ref 6.0–8.3)

## 2015-02-07 LAB — CBC
HCT: 37.6 % — ABNORMAL LOW (ref 39.0–52.0)
Hemoglobin: 12.3 g/dL — ABNORMAL LOW (ref 13.0–17.0)
MCH: 29.6 pg (ref 26.0–34.0)
MCHC: 32.7 g/dL (ref 30.0–36.0)
MCV: 90.4 fL (ref 78.0–100.0)
Platelets: 204 10*3/uL (ref 150–400)
RBC: 4.16 MIL/uL — ABNORMAL LOW (ref 4.22–5.81)
RDW: 14.8 % (ref 11.5–15.5)
WBC: 11.1 10*3/uL — ABNORMAL HIGH (ref 4.0–10.5)

## 2015-02-07 LAB — URINE CULTURE: Colony Count: 15000

## 2015-02-07 LAB — LACTIC ACID, PLASMA
Lactic Acid, Venous: 1.3 mmol/L (ref 0.5–2.0)
Lactic Acid, Venous: 0.8 mmol/L (ref 0.5–2.0)

## 2015-02-07 LAB — CLOSTRIDIUM DIFFICILE BY PCR: Toxigenic C. Difficile by PCR: NEGATIVE

## 2015-02-07 LAB — URINE MICROSCOPIC-ADD ON

## 2015-02-07 MED ORDER — HYDRALAZINE HCL 20 MG/ML IJ SOLN
10.0000 mg | Freq: Four times a day (QID) | INTRAMUSCULAR | Status: DC | PRN
  Administered 2015-02-08: 10 mg via INTRAVENOUS
  Filled 2015-02-07: qty 1

## 2015-02-07 MED ORDER — FUROSEMIDE 10 MG/ML IJ SOLN
40.0000 mg | Freq: Once | INTRAMUSCULAR | Status: AC
  Administered 2015-02-07: 40 mg via INTRAVENOUS
  Filled 2015-02-07: qty 4

## 2015-02-07 MED ORDER — INSULIN GLARGINE 100 UNIT/ML ~~LOC~~ SOLN
10.0000 [IU] | Freq: Every morning | SUBCUTANEOUS | Status: DC
  Administered 2015-02-08 – 2015-02-13 (×6): 10 [IU] via SUBCUTANEOUS
  Filled 2015-02-07 (×6): qty 0.1

## 2015-02-07 MED ORDER — METOPROLOL TARTRATE 25 MG PO TABS
25.0000 mg | ORAL_TABLET | Freq: Two times a day (BID) | ORAL | Status: DC
  Administered 2015-02-07 – 2015-02-09 (×4): 25 mg via ORAL
  Filled 2015-02-07 (×4): qty 1

## 2015-02-07 MED ORDER — PIPERACILLIN-TAZOBACTAM 3.375 G IVPB
3.3750 g | Freq: Three times a day (TID) | INTRAVENOUS | Status: DC
  Administered 2015-02-07 – 2015-02-11 (×12): 3.375 g via INTRAVENOUS
  Filled 2015-02-07 (×11): qty 50

## 2015-02-07 MED ORDER — SODIUM CHLORIDE 0.9 % IV SOLN
INTRAVENOUS | Status: DC
  Administered 2015-02-07 – 2015-02-08 (×2): via INTRAVENOUS

## 2015-02-07 NOTE — Progress Notes (Signed)
Called infectious disease RN about doctor continues to d/c isolation.  Patient is on droplet isolation and enteric isolation to r/o C diff.  Dr. Carles Collet d/c'd isolation ordered earlier.  Droplet isolation has to be in place, and patient now with diarrhea, so enteric isolation in place also.  Continue to monitor patient closely.  Cheryal Salas Roselie Awkward RN

## 2015-02-07 NOTE — Progress Notes (Signed)
PROGRESS NOTE  MILLEDGE GERDING KXF:[id number] DOB: 12-30-1950 DOA: 02/05/2015 PCP: Kandice Hams, MD  Brief History 64 year old male 64 year old male with a history of diabetes mellitus, prostate cancer hypertension, coronary artery disease, gouty arthritis presents with one-week history of progressive shortness of breath, coughing, or lightheadedness. The patient had a fever of 104.107F in the emergency department with associated tachycardia, tachypnea and hypoxemia. The patient was admitted for sepsis workup. Working diagnosis was pneumonia, and the patient was started on vancomycin and cefepime. Influenza PCR was negative, but respiratory viral panel was obtained. During the course of the day on 02/06/2015, the patient developed progressive dyspnea. Repeat chest x-ray showed increase in interstitial thickening particularly at the lung bases. BNP was 91 and repeat lactic acid was 1.2. The patient was clinical stable when he was initially seen at 0900, but he developed progressive dyspnea prompting transfer to the stepdown unit. I reevaluated the patient at 1545. He denied any chest pain but complained of shortness of breath. He has also had 2 loose stools during the course of the day. C. difficile PCR was sent. He denied any vomiting, abdominal pain, dysuria, hematuria.  Assessment/Plan: Acute respiratory failure -Concerns for pulmonary edema versus interstitial pneumonitis -Continue broad-spectrum IV antibiotics -02/06/2015 chest x-ray shows increasing interstitial thickening -Improved breathing/decreased tachypnea with furosemide 02/06/2015 -redose IV furosemide 40mg  -ABG--7.42/36/58/23 (2L) -Chek procalcitonin 3.14 -Xopenex and Atrovent nebs -Influenza negative -Respiratory viral panel Sepsis -present at the time of admission -concerned about pneumonia -Repeat lactic acid  1.5,  -check procalcitonin--3.14 -Continue IV vancomycin and cefepime -Influenza PCR negative-->  discontinue oseltamivir  -4/18--repeat CXR -4/18--abdominal xray Acute on chronic diastolic CHF -Daily weights -Redose furosemide -Repeat echocardiogram -06/04/2012 echo shows EF 69-67%, grade 2 diastolic dysfunction -Continue metoprolol tartrate for now Acute on chronic renal failure (CKD 2-3) -Secondary to sepsis  -Initially some improvement with fluid hydration, but now concerned about fluid overload  -Monitor renal function closely  Diabetes mellitus type 2  -Continue Lantus 25 units  -NovoLog sliding scale  Coronary artery disease  -Continue metoprolol tartrate  -Continue Plavix hypertension  -Continue metoprolol tartrate  -I expect improvement with diuresis  Tremor -resting tremor -pt can suppress on command -suspect a component of shivering and rigors as well as worsening due to acute medical ilnness    Family Communication: Sister updated at beside 4/16 Disposition Plan: remain in stepdown     Procedures/Studies: Dg Chest Port 1 View  02/06/2015   CLINICAL DATA:  Worsening dyspnea and fever. Short of breath today. History of hypertension.  EXAM: PORTABLE CHEST - 1 VIEW  COMPARISON:  02/05/2015.  FINDINGS: Since prior exam, bilateral interstitial thickening has increased. Mild hazy airspace opacity is noted in the medial right lung base and lateral left lung base. There is no convincing pleural effusion and no pneumothorax.  Cardiac silhouette is mildly enlarged. No mediastinal or hilar masses.  Bony thorax is grossly intact.  IMPRESSION: 1. Increased interstitial thickening when compared the prior study and with mild hazy airspace opacity at the lung bases. Findings could reflect pulmonary edema. However, given the history of fever, diffuse interstitial pneumonia is suspected.   Electronically Signed   By: Lajean Manes M.D.   On: 02/06/2015 09:27   Dg Chest Port 1 View  (if Code Sepsis Called)  02/05/2015   CLINICAL DATA:  Shortness of breath. Dizziness.  Shortness of breath for 2 days. Dizziness is today. History of diabetes.  EXAM: PORTABLE CHEST -  1 VIEW  COMPARISON:  12/15/2013  FINDINGS: Cardiac silhouette normal in size configuration. No mediastinal or hilar masses. Lungs are clear. No pleural effusion or pneumothorax.  Bony thorax is intact.  IMPRESSION: No acute cardiopulmonary disease.   Electronically Signed   By: Lajean Manes M.D.   On: 02/05/2015 19:15         Subjective: Patient breathing better than yesterday but still a little breath. Denies any headache, chest pain, hemoptysis, nausea, vomiting, diarrhea. He denies any abdominal pain, dysuria. Has a nonproductive cough.  Objective: Filed Vitals:   02/07/15 0200 02/07/15 0400 02/07/15 0600 02/07/15 0933  BP: 141/81 133/71  156/108  Pulse: 97 97  117  Temp:   101.6 F (38.7 C)   TempSrc:   Oral   Resp: 31 29    Height:      Weight:      SpO2: 95% 95%      Intake/Output Summary (Last 24 hours) at 02/07/15 1004 Last data filed at 02/07/15 0900  Gross per 24 hour  Intake   1050 ml  Output   1400 ml  Net   -350 ml   Weight change: 2.009 kg (4 lb 6.9 oz) Exam:   General:  Pt is alert, follows commands appropriately, not in acute distress  HEENT: No icterus, No thrush,  [place]/AT  Cardiovascular: RRR, S1/S2, no rubs, no gallops  Respiratory: Bilateral crackles, left greater than right. Minimal basilar wheezes  Abdomen: Soft/+BS, non tender, mildly distended, no guarding  Extremities: trace LE edema, No lymphangitis, No petechiae, No rashes, no synovitis  Data Reviewed: Basic Metabolic Panel:  Recent Labs Lab 02/05/15 1845 02/06/15 0533 02/06/15 1627  NA 132* 136 138  K 4.1 4.0 3.8  CL 100 104 106  CO2 24 23 23   GLUCOSE 238* 163* 159*  BUN 29* 25* 23  CREATININE 1.89* 1.62* 1.58*  CALCIUM 8.1* 7.7* 7.6*  MG  --  2.3  --   PHOS  --  3.7  --    Liver Function Tests:  Recent Labs Lab 02/05/15 1845 02/06/15 0533 02/06/15 1627  AST 41* 38* 45*    ALT 31 29 35  ALKPHOS 94 85 108  BILITOT 1.7* 1.4* 1.4*  PROT 7.7 6.7 6.6  ALBUMIN 3.6 3.0* 2.9*   No results for input(s): LIPASE, AMYLASE in the last 168 hours. No results for input(s): AMMONIA in the last 168 hours. CBC:  Recent Labs Lab 02/05/15 1845 02/06/15 0533 02/06/15 1627 02/07/15 0932  WBC 10.0 10.4 9.2 11.1*  NEUTROABS 8.6*  --   --   --   HGB 13.8 13.0 12.4* 12.3*  HCT 41.3 38.5* 38.3* 37.6*  MCV 89.2 90.0 89.1 90.4  PLT 181 171 181 204   Cardiac Enzymes: No results for input(s): CKTOTAL, CKMB, CKMBINDEX, TROPONINI in the last 168 hours. BNP: Invalid input(s): POCBNP CBG:  Recent Labs Lab 02/06/15 0742 02/06/15 1109 02/06/15 1557 02/07/15 0030 02/07/15 0744  GLUCAP 132* 168* 153* 134* 170*    Recent Results (from the past 240 hour(s))  Blood Culture (routine x 2)     Status: None (Preliminary result)   Collection Time: 02/05/15  6:45 PM  Result Value Ref Range Status   Specimen Description BLOOD LEFT ARM  Final   Special Requests BOTTLES DRAWN AEROBIC AND ANAEROBIC 5CC  Final   Culture   Final           BLOOD CULTURE RECEIVED NO GROWTH TO DATE CULTURE WILL BE HELD FOR  5 DAYS BEFORE ISSUING A FINAL NEGATIVE REPORT Performed at [facility]    Report Status PENDING  Incomplete  Blood Culture (routine x 2)     Status: None (Preliminary result)   Collection Time: 02/05/15  6:45 PM  Result Value Ref Range Status   Specimen Description BLOOD RIGHT FOREARM  Final   Special Requests BOTTLES DRAWN AEROBIC AND ANAEROBIC 5CC  Final   Culture   Final           BLOOD CULTURE RECEIVED NO GROWTH TO DATE CULTURE WILL BE HELD FOR 5 DAYS BEFORE ISSUING A FINAL NEGATIVE REPORT Performed at [facility]    Report Status PENDING  Incomplete  MRSA PCR Screening     Status: None   Collection Time: 02/06/15  5:02 PM  Result Value Ref Range Status   MRSA by PCR NEGATIVE NEGATIVE Final    Comment:        The GeneXpert MRSA Assay (FDA approved for  NASAL specimens only), is one component of a comprehensive MRSA colonization surveillance program. It is not intended to diagnose MRSA infection nor to guide or monitor treatment for MRSA infections.      Scheduled Meds: . ceFEPime (MAXIPIME) IV  2 g Intravenous Q24H  . clopidogrel  75 mg Oral Daily  . enoxaparin (LOVENOX) injection  40 mg Subcutaneous Daily  . guaiFENesin  600 mg Oral BID  . insulin aspart  0-15 Units Subcutaneous TID WC  . insulin aspart  0-5 Units Subcutaneous QHS  . insulin glargine  25 Units Subcutaneous q morning - 10a  . ipratropium  0.5 mg Nebulization TID  . levalbuterol  0.63 mg Nebulization TID  . metoprolol tartrate  12.5 mg Oral BID  . sodium chloride  3 mL Intravenous Q12H  . vancomycin  1,250 mg Intravenous Q24H   Continuous Infusions: . sodium chloride 10 mL/hr at 02/07/15 0604     Eon Zunker, DO  [facility] Pager [phone]  If 7PM-7AM, please contact night-coverage [email or web address] Password [email or web address] 02/07/2015, 10:04 AM   LOS: 2 days

## 2015-02-07 NOTE — Progress Notes (Signed)
  Echocardiogram 2D Echocardiogram has been performed.  Lysle Rubens 02/07/2015, 3:23 PM

## 2015-02-08 ENCOUNTER — Inpatient Hospital Stay (HOSPITAL_COMMUNITY): Payer: 59

## 2015-02-08 DIAGNOSIS — M7989 Other specified soft tissue disorders: Secondary | ICD-10-CM

## 2015-02-08 LAB — COMPREHENSIVE METABOLIC PANEL
ALT: 56 U/L — ABNORMAL HIGH (ref 0–53)
AST: 76 U/L — ABNORMAL HIGH (ref 0–37)
Albumin: 2.8 g/dL — ABNORMAL LOW (ref 3.5–5.2)
Alkaline Phosphatase: 180 U/L — ABNORMAL HIGH (ref 39–117)
Anion gap: 12 (ref 5–15)
BUN: 29 mg/dL — ABNORMAL HIGH (ref 6–23)
CO2: 24 mmol/L (ref 19–32)
Calcium: 7.8 mg/dL — ABNORMAL LOW (ref 8.4–10.5)
Chloride: 99 mmol/L (ref 96–112)
Creatinine, Ser: 1.99 mg/dL — ABNORMAL HIGH (ref 0.50–1.35)
GFR calc Af Amer: 39 mL/min — ABNORMAL LOW (ref >90)
GFR calc non Af Amer: 34 mL/min — ABNORMAL LOW (ref >90)
Glucose, Bld: 139 mg/dL — ABNORMAL HIGH (ref 70–99)
Potassium: 4.3 mmol/L (ref 3.5–5.1)
Sodium: 135 mmol/L (ref 135–145)
Total Bilirubin: 1.9 mg/dL — ABNORMAL HIGH (ref 0.3–1.2)
Total Protein: 6.9 g/dL (ref 6.0–8.3)

## 2015-02-08 LAB — GLUCOSE, CAPILLARY
Glucose-Capillary: 173 mg/dL — ABNORMAL HIGH (ref 70–99)
Glucose-Capillary: 133 mg/dL — ABNORMAL HIGH (ref 70–99)
Glucose-Capillary: 133 mg/dL — ABNORMAL HIGH (ref 70–99)
Glucose-Capillary: 115 mg/dL — ABNORMAL HIGH (ref 70–99)

## 2015-02-08 LAB — URINE CULTURE
Colony Count: NO GROWTH
Culture: NO GROWTH

## 2015-02-08 LAB — VANCOMYCIN, TROUGH: Vancomycin Tr: 8 ug/mL — ABNORMAL LOW (ref 10.0–20.0)

## 2015-02-08 LAB — CBC
HCT: 39.2 % (ref 39.0–52.0)
Hemoglobin: 12.8 g/dL — ABNORMAL LOW (ref 13.0–17.0)
MCH: 29.4 pg (ref 26.0–34.0)
MCHC: 32.7 g/dL (ref 30.0–36.0)
MCV: 90.1 fL (ref 78.0–100.0)
Platelets: 220 10*3/uL (ref 150–400)
RBC: 4.35 MIL/uL (ref 4.22–5.81)
RDW: 14.8 % (ref 11.5–15.5)
WBC: 11.4 10*3/uL — ABNORMAL HIGH (ref 4.0–10.5)

## 2015-02-08 LAB — PROCALCITONIN: Procalcitonin: 4.18 ng/mL

## 2015-02-08 LAB — HEMOGLOBIN A1C
Hgb A1c MFr Bld: 6.9 % — ABNORMAL HIGH (ref 4.8–5.6)
Mean Plasma Glucose: 151 mg/dL

## 2015-02-08 MED ORDER — LEVOFLOXACIN 500 MG PO TABS
500.0000 mg | ORAL_TABLET | Freq: Every day | ORAL | Status: DC
  Administered 2015-02-08 – 2015-02-13 (×6): 500 mg via ORAL
  Filled 2015-02-08 (×6): qty 1

## 2015-02-08 MED ORDER — ENOXAPARIN SODIUM 60 MG/0.6ML ~~LOC~~ SOLN
0.5000 mg/kg | Freq: Every day | SUBCUTANEOUS | Status: DC
  Administered 2015-02-09 – 2015-02-13 (×5): 50 mg via SUBCUTANEOUS
  Filled 2015-02-08 (×5): qty 0.6

## 2015-02-08 MED ORDER — VANCOMYCIN HCL 10 G IV SOLR
1500.0000 mg | INTRAVENOUS | Status: DC
  Administered 2015-02-09: 1500 mg via INTRAVENOUS
  Filled 2015-02-08 (×2): qty 1500

## 2015-02-08 MED ORDER — LABETALOL HCL 5 MG/ML IV SOLN
10.0000 mg | Freq: Four times a day (QID) | INTRAVENOUS | Status: DC | PRN
  Administered 2015-02-08: 10 mg via INTRAVENOUS
  Filled 2015-02-08 (×2): qty 4

## 2015-02-08 NOTE — Progress Notes (Signed)
PROGRESS NOTE  Gregory [name] SNK:[id number] DOB: [birth year] DOA: 02/05/2015 PCP: Kandice Hams, MD   Brief History 64 year old male 64 year old male with a history of diabetes mellitus, prostate cancer hypertension, coronary artery disease, gouty arthritis presents with one-week history of progressive shortness of breath, coughing, or lightheadedness. The patient had a fever of 104.28F in the emergency department with associated tachycardia, tachypnea and hypoxemia. The patient was admitted for sepsis workup. Working diagnosis was pneumonia, and the patient was started on vancomycin and cefepime. Influenza PCR was negative, but respiratory viral panel was obtained. During the course of the day on 02/06/2015, the patient developed progressive dyspnea. Repeat chest x-ray showed increase in interstitial thickening particularly at the lung bases. BNP was 91 and repeat lactic acid was 1.2. The patient was clinical stable when he was initially seen at 0900, but he developed progressive dyspnea prompting transfer to the stepdown unit.   Assessment/Plan: Acute hypoxemic respiratory failure -multifactorial including pulmonary edema, interstitial pneumonitis, lobar pneumonia -Continue broad-spectrum IV antibiotics -02/06/2015 chest x-ray showed increasing interstitial thickening -mildly Improved breathing/decreased tachypnea with furosemide 02/06/2015 -02/07/15 redosed IV furosemide 40mg --plan to maintain even I/O for now -wean to 4L [place] today -Chek procalcitonin 3.14-->4.18 -Xopenex and Atrovent nebs -Influenza negative -Respiratory viral panel--pending  Pneumonia -add levofloxacin to cover atypicals -hesitate to add azithro (cause cholestasis) due to already elevated LFTs -urine legionella antigen  Sepsis -present at the time of admission -concerned about pneumonia -Repeat lactic acid 1.5,  -check procalcitonin--3.14-->4.18 -Continue IV vancomycin and cefepime -Influenza PCR  negative--> discontinue oseltamivir  -4/18--repeat CXR--increased interstitial marking -4/17--abdominal xray--nonspecific bowel gas -02/07/2015 CT chest--paratracheal lymph node 11 mm, trace right pleural effusion -02/07/2015 CT abdomen and pelvis--no bowel obstruction, negative gallbladder and liver, mild perinephric stranding, 4.5x3cm ovoid lesion left external iliac chain--possible lymphocele -Patient continues to have fevers, cefepime to Zosyn on 02/07/2015 -02/07/15 repeated blood cultures  Transaminasemia/Hyperbilirubinemia -suspect due to sepsis and cholestasis -RUQ ultrasound  Acute on chronic diastolic CHF -Daily weights -Repeat echocardiogram--EF 93-79%, grade 1 diastolic dysfunction, no WMA -06/04/2012 echo shows EF 02-40%, grade 2 diastolic dysfunction -Continue metoprolol tartrate for now Acute on chronic renal failure (CKD 2-3) -Secondary to sepsis  -Initially some improvement with fluid hydration, but then concerned about fluid overload  -Monitor renal function closely  Diabetes mellitus type 2  -Decreased to Lantus 10 units  -NovoLog sliding scale  Coronary artery disease  -Continue metoprolol tartrate  -Continue Plavix hypertension  -Continue metoprolol tartrate--increased to 25mg  bid  Tremor -resting tremor -pt can suppress on command -suspect a component of shivering and rigors as well as worsening due to acute medical ilnness LUE edema -venous duplex--?superficial phlebitis  Family Communication: no family at bedside Disposition Plan: remain in stepdown     Procedures/Studies: Ct Abdomen Pelvis Wo Contrast  02/07/2015   CLINICAL DATA:  Diarrhea, fever, cough, and worsening shortness of breath for 1 week. Sepsis and acute renal failure. History of prostate cancer.  EXAM: CT CHEST, ABDOMEN AND PELVIS WITHOUT CONTRAST  TECHNIQUE: Multidetector CT imaging of the chest, abdomen and pelvis was performed following the standard protocol without IV  contrast.  COMPARISON:  Three-way abdominal series earlier today. CT pelvis 12/15/2013.  FINDINGS: CT CHEST FINDINGS  Mildly enlarged right paratracheal lymph nodes measure up to 11 mm in short axis, most likely reactive. No definite enlarged hilar lymph nodes are identified, however evaluation is limited given lack of IV contrast. Three-vessel coronary artery calcification is noted. The  heart is mildly enlarged. There is a trace right pleural effusion. There is no pericardial effusion.  Major airways are patent. There is consolidation with air bronchograms involving much of the right lower lobe. Minimal opacity in the left lower lobe likely represents atelectasis. No lytic or blastic osseous lesions are identified.  CT ABDOMEN AND PELVIS FINDINGS  The liver, gallbladder, spleen, adrenal glands, and pancreas have an unremarkable unenhanced appearance. Small hypodensities in the kidneys are most compatible with cysts, measuring up to 1.2 cm on the right and 2.3 cm on the left. There is mild, symmetric perinephric stranding.  Oral contrast is present in distal small bowel and colon to the level of the proximal sigmoid colon without evidence of obstruction. There is mild colonic diverticulosis without evidence of diverticulitis. The prostate is absent. Bladder is unremarkable. Diffuse aortoiliac atherosclerotic calcification is noted.  There is an oval low-density lesion measuring 4.5 x 3.0 cm along the left external iliac chain, new from the prior CT. A similar but smaller lesion along the right external iliac chain is also new, measuring 2.0 x 1.3 cm. No enlarged lymph nodes are identified elsewhere in the abdomen or pelvis. There is no free fluid. There is atrophy of the left psoas muscle. Degenerative changes are again noted in the thoracolumbar spine, hips, and SI joints.  IMPRESSION: 1. Right lower lobe pneumonia. 2. No acute abnormality identified in the abdomen or pelvis. 3. Two new low-density lesions along  the external iliac chains, likely small lymphoceles.   Electronically Signed   By: Logan Bores   On: 02/07/2015 19:14   Ct Chest Wo Contrast  02/07/2015   CLINICAL DATA:  Diarrhea, fever, cough, and worsening shortness of breath for 1 week. Sepsis and acute renal failure. History of prostate cancer.  EXAM: CT CHEST, ABDOMEN AND PELVIS WITHOUT CONTRAST  TECHNIQUE: Multidetector CT imaging of the chest, abdomen and pelvis was performed following the standard protocol without IV contrast.  COMPARISON:  Three-way abdominal series earlier today. CT pelvis 12/15/2013.  FINDINGS: CT CHEST FINDINGS  Mildly enlarged right paratracheal lymph nodes measure up to 11 mm in short axis, most likely reactive. No definite enlarged hilar lymph nodes are identified, however evaluation is limited given lack of IV contrast. Three-vessel coronary artery calcification is noted. The heart is mildly enlarged. There is a trace right pleural effusion. There is no pericardial effusion.  Major airways are patent. There is consolidation with air bronchograms involving much of the right lower lobe. Minimal opacity in the left lower lobe likely represents atelectasis. No lytic or blastic osseous lesions are identified.  CT ABDOMEN AND PELVIS FINDINGS  The liver, gallbladder, spleen, adrenal glands, and pancreas have an unremarkable unenhanced appearance. Small hypodensities in the kidneys are most compatible with cysts, measuring up to 1.2 cm on the right and 2.3 cm on the left. There is mild, symmetric perinephric stranding.  Oral contrast is present in distal small bowel and colon to the level of the proximal sigmoid colon without evidence of obstruction. There is mild colonic diverticulosis without evidence of diverticulitis. The prostate is absent. Bladder is unremarkable. Diffuse aortoiliac atherosclerotic calcification is noted.  There is an oval low-density lesion measuring 4.5 x 3.0 cm along the left external iliac chain, new from the  prior CT. A similar but smaller lesion along the right external iliac chain is also new, measuring 2.0 x 1.3 cm. No enlarged lymph nodes are identified elsewhere in the abdomen or pelvis. There is no free fluid.  There is atrophy of the left psoas muscle. Degenerative changes are again noted in the thoracolumbar spine, hips, and SI joints.  IMPRESSION: 1. Right lower lobe pneumonia. 2. No acute abnormality identified in the abdomen or pelvis. 3. Two new low-density lesions along the external iliac chains, likely small lymphoceles.   Electronically Signed   By: Logan Bores   On: 02/07/2015 19:14   Dg Chest Port 1 View  02/08/2015   CLINICAL DATA:  Respiratory failure.  EXAM: PORTABLE CHEST - 1 VIEW  COMPARISON:  CT 02/07/2015.  Chest x-ray 02/06/2015.  FINDINGS: Cardiomegaly with pulmonary venous congestion and bilateral infiltrates. Small right pleural effusion. These findings are consistent with congestive heart failure. Underlying pneumonia particularly in the right lower lobe cannot be excluded. No pneumothorax. No acute bony abnormality.  IMPRESSION: 1. Congestive heart failure with bilateral pulmonary edema and small right pleural effusion. 2. Persistent underlying right lower lobe pneumonia cannot be excluded .   Electronically Signed   By: Marcello Moores  Register   On: 02/08/2015 07:19   Dg Chest Port 1 View  02/06/2015   CLINICAL DATA:  Worsening dyspnea and fever. Short of breath today. History of hypertension.  EXAM: PORTABLE CHEST - 1 VIEW  COMPARISON:  02/05/2015.  FINDINGS: Since prior exam, bilateral interstitial thickening has increased. Mild hazy airspace opacity is noted in the medial right lung base and lateral left lung base. There is no convincing pleural effusion and no pneumothorax.  Cardiac silhouette is mildly enlarged. No mediastinal or hilar masses.  Bony thorax is grossly intact.  IMPRESSION: 1. Increased interstitial thickening when compared the prior study and with mild hazy airspace  opacity at the lung bases. Findings could reflect pulmonary edema. However, given the history of fever, diffuse interstitial pneumonia is suspected.   Electronically Signed   By: Lajean Manes M.D.   On: 02/06/2015 09:27   Dg Chest Port 1 View  (if Code Sepsis Called)  02/05/2015   CLINICAL DATA:  Shortness of breath. Dizziness. Shortness of breath for 2 days. Dizziness is today. History of diabetes.  EXAM: PORTABLE CHEST - 1 VIEW  COMPARISON:  12/15/2013  FINDINGS: Cardiac silhouette normal in size configuration. No mediastinal or hilar masses. Lungs are clear. No pleural effusion or pneumothorax.  Bony thorax is intact.  IMPRESSION: No acute cardiopulmonary disease.   Electronically Signed   By: Lajean Manes M.D.   On: 02/05/2015 19:15   Dg Abd Acute W/chest  02/07/2015   CLINICAL DATA:  64 year old male with a history of shortness of breath.  EXAM: DG ABDOMEN ACUTE W/ 1V CHEST  COMPARISON:  Chest x-ray 02/06/2015, CT pelvis 12/15/2013  FINDINGS: Chest:  Cardiomediastinal silhouette unchanged in size and contour with cardiomegaly. Mediastinal border somewhat obscures secondary to the apical lordotic positioning.  Pulmonary vascular congestion.  Linear and patchy opacities at the bilateral lung bases with obscuration of the retrocardiac region. No pneumothorax.  Decubitus chest image demonstrates no layering pleural effusion along the right mediastinum.  Abdomen:  Small bowel loops and colon filled with gas. Borderline dilation of central small bowel loops. Body habitus limits evaluation the abdomen.  Decubitus image limited by respiratory motion, though there are air-fluid levels present.  No displaced fracture.  IMPRESSION: Chest:  Linear and patchy opacities at the lung bases may reflect atelectasis, edema, and/or consolidation. No layering pleural effusion.  Abdomen:  Limited abdominal plain film demonstrates nonspecific bowel gas pattern with borderline dilated small bowel and several air-fluid levels.  If there is  concern for acute intra-abdominal process, CT would be recommended.  Signed,  Dulcy Fanny. Earleen Newport, DO  [facility]  [facility]   Electronically Signed   By: Corrie Mckusick D.O.   On: 02/07/2015 11:51         Subjective:  patient compliance of some shortness of breath. Denies any chest pain, nausea, vomiting, diarrhea, abdominal pain, dysuria. Denies any headaches or neck pain. No rashes.  Objective: Filed Vitals:   02/08/15 0537 02/08/15 0600 02/08/15 0800 02/08/15 0824  BP:  159/78    Pulse:  111    Temp:   100.1 F (37.8 C)   TempSrc:   Oral   Resp:  31    Height:      Weight:      SpO2: 95%   95%    Intake/Output Summary (Last 24 hours) at 02/08/15 0839 Last data filed at 02/08/15 9562  Gross per 24 hour  Intake   1073 ml  Output   3202 ml  Net  -2129 ml   Weight change: -2 kg (-4 lb 6.5 oz) Exam:   General:  Pt is alert, follows commands appropriately, not in acute distress  HEENT: No icterus, No thrush,  [place]/AT; no meningismus  Cardiovascular: RRR, S1/S2, no rubs, no gallops  Respiratory: bilateral respiratory wheezes. Good air movement.   Abdomen: Soft/+BS, non tender, non distended, no guarding  Extremities: No LE edema, No lymphangitis, No petechiae, No rashes, no synovitis; mild left hand, wrist, forearm edema without any erythema or crepitance.   Data Reviewed: Basic Metabolic Panel:  Recent Labs Lab 02/05/15 1845 02/06/15 0533 02/06/15 1627 02/07/15 0932 02/08/15 0412  NA 132* 136 138 135 135  K 4.1 4.0 3.8 4.1 4.3  CL 100 104 106 102 99  CO2 24 23 23 25 24   GLUCOSE 238* 163* 159* 248* 139*  BUN 29* 25* 23 27* 29*  CREATININE 1.89* 1.62* 1.58* 1.72* 1.99*  CALCIUM 8.1* 7.7* 7.6* 7.6* 7.8*  MG  --  2.3  --   --   --   PHOS  --  3.7  --   --   --    Liver Function Tests:  Recent Labs Lab 02/05/15 1845 02/06/15 0533 02/06/15 1627 02/07/15 0932 02/08/15 0412  AST 41* 38* 45* 53*  76*  ALT 31 29 35 42 56*  ALKPHOS 94 85 108 132* 180*  BILITOT 1.7* 1.4* 1.4* 1.3* 1.9*  PROT 7.7 6.7 6.6 6.6 6.9  ALBUMIN 3.6 3.0* 2.9* 2.8* 2.8*   No results for input(s): LIPASE, AMYLASE in the last 168 hours. No results for input(s): AMMONIA in the last 168 hours. CBC:  Recent Labs Lab 02/05/15 1845 02/06/15 0533 02/06/15 1627 02/07/15 0932 02/08/15 0412  WBC 10.0 10.4 9.2 11.1* 11.4*  NEUTROABS 8.6*  --   --   --   --   HGB 13.8 13.0 12.4* 12.3* 12.8*  HCT 41.3 38.5* 38.3* 37.6* 39.2  MCV 89.2 90.0 89.1 90.4 90.1  PLT 181 171 181 204 220   Cardiac Enzymes: No results for input(s): CKTOTAL, CKMB, CKMBINDEX, TROPONINI in the last 168 hours. BNP: Invalid input(s): POCBNP CBG:  Recent Labs Lab 02/07/15 0809 02/07/15 1235 02/07/15 1702 02/07/15 2146 02/08/15 0738  GLUCAP 141* 144* 116* 173* 133*    Recent Results (from the past 240 hour(s))  Blood Culture (routine x 2)     Status: None (Preliminary result)   Collection Time: 02/05/15  6:45 PM  Result Value  Ref Range Status   Specimen Description BLOOD LEFT ARM  Final   Special Requests BOTTLES DRAWN AEROBIC AND ANAEROBIC 5CC  Final   Culture   Final           BLOOD CULTURE RECEIVED NO GROWTH TO DATE CULTURE WILL BE HELD FOR 5 DAYS BEFORE ISSUING A FINAL NEGATIVE REPORT Performed at [facility]    Report Status PENDING  Incomplete  Blood Culture (routine x 2)     Status: None (Preliminary result)   Collection Time: 02/05/15  6:45 PM  Result Value Ref Range Status   Specimen Description BLOOD RIGHT FOREARM  Final   Special Requests BOTTLES DRAWN AEROBIC AND ANAEROBIC 5CC  Final   Culture   Final           BLOOD CULTURE RECEIVED NO GROWTH TO DATE CULTURE WILL BE HELD FOR 5 DAYS BEFORE ISSUING A FINAL NEGATIVE REPORT Performed at [facility]    Report Status PENDING  Incomplete  Urine culture     Status: None   Collection Time: 02/05/15  6:52 PM  Result Value Ref Range Status   Specimen  Description URINE, RANDOM  Final   Special Requests NONE  Final   Colony Count   Final    15,000 COLONIES/ML Performed at [facility]    Culture   Final    DIPHTHEROIDS(CORYNEBACTERIUM SPECIES) Note: Standardized susceptibility testing for this organism is not available. Performed at [facility]    Report Status 02/07/2015 FINAL  Final  MRSA PCR Screening     Status: None   Collection Time: 02/06/15  5:02 PM  Result Value Ref Range Status   MRSA by PCR NEGATIVE NEGATIVE Final    Comment:        The GeneXpert MRSA Assay (FDA approved for NASAL specimens only), is one component of a comprehensive MRSA colonization surveillance program. It is not intended to diagnose MRSA infection nor to guide or monitor treatment for MRSA infections.   Culture, blood (routine x 2)     Status: None (Preliminary result)   Collection Time: 02/07/15  2:07 PM  Result Value Ref Range Status   Specimen Description BLOOD LEFT ARM  Final   Special Requests   Final    BOTTLES DRAWN AEROBIC AND ANAEROBIC 10CC BOTH BOTTLES   Culture   Final           BLOOD CULTURE RECEIVED NO GROWTH TO DATE CULTURE WILL BE HELD FOR 5 DAYS BEFORE ISSUING A FINAL NEGATIVE REPORT Performed at [facility]    Report Status PENDING  Incomplete  Culture, blood (routine x 2)     Status: None (Preliminary result)   Collection Time: 02/07/15  2:14 PM  Result Value Ref Range Status   Specimen Description BLOOD RIGHT ARM  Final   Special Requests   Final    BOTTLES DRAWN AEROBIC AND ANAEROBIC 10CC BOTH BOTTLES   Culture   Final           BLOOD CULTURE RECEIVED NO GROWTH TO DATE CULTURE WILL BE HELD FOR 5 DAYS BEFORE ISSUING A FINAL NEGATIVE REPORT Performed at [facility]    Report Status PENDING  Incomplete  Clostridium Difficile by PCR     Status: None   Collection Time: 02/07/15  4:10 PM  Result Value Ref Range Status   C difficile by pcr NEGATIVE NEGATIVE Final     Scheduled  Meds: . clopidogrel  75 mg Oral Daily  .  enoxaparin (LOVENOX) injection  40 mg Subcutaneous Daily  . guaiFENesin  600 mg Oral BID  . insulin aspart  0-15 Units Subcutaneous TID WC  . insulin aspart  0-5 Units Subcutaneous QHS  . insulin glargine  10 Units Subcutaneous q morning - 10a  . ipratropium  0.5 mg Nebulization TID  . levalbuterol  0.63 mg Nebulization TID  . metoprolol tartrate  25 mg Oral BID  . piperacillin-tazobactam  3.375 g Intravenous 3 times per day  . sodium chloride  3 mL Intravenous Q12H  . vancomycin  1,250 mg Intravenous Q24H   Continuous Infusions: . sodium chloride 10 mL/hr at 02/07/15 0604     Sibbie Flammia, DO  [facility] Pager [phone]  If 7PM-7AM, please contact night-coverage [email or web address] Password [facility] 02/08/2015, 8:39 AM   LOS: 3 days

## 2015-02-08 NOTE — Progress Notes (Signed)
*  Preliminary Results* Left upper extremity venous duplex completed. Left upper extremity is negative for deep and superficial vein thrombosis.  02/08/2015 1:57 PM  Maudry Mayhew, RVT, RDCS, RDMS

## 2015-02-08 NOTE — Progress Notes (Signed)
ANTIBIOTIC CONSULT NOTE  Pharmacy Consult for vancomycin Indication: RLL pneumonia  No Known Allergies  Patient Measurements: Height: 5\' 7"  (170.2 cm) Weight: 221 lb 1.9 oz (100.3 kg) IBW/kg (Calculated) : 66.1   Vital Signs: Temp: 101.3 F (38.5 C) (04/18 1057) Temp Source: Oral (04/18 1057) BP: 159/82 mmHg (04/18 0900) Pulse Rate: 109 (04/18 0800) Intake/Output from previous day: 04/17 0701 - 04/18 0700 In: 1083 [P.O.:660; I.V.:23; IV Piggyback:400] Out: 3102 [Urine:3101; Stool:1] Intake/Output from this shift: Total I/O In: 483 [P.O.:480; I.V.:3] Out: 700 [Urine:700]  Labs:  Recent Labs  02/06/15 1235 02/06/15 1627 02/07/15 0932 02/08/15 0412  WBC  --  9.2 11.1* 11.4*  HGB  --  12.4* 12.3* 12.8*  PLT  --  181 204 220  LABCREA 122.32  --   --   --   CREATININE  --  1.58* 1.72* 1.99*   Estimated Creatinine Clearance: 42.3 mL/min (by C-G formula based on Cr of 1.99). No results for input(s): VANCOTROUGH, VANCOPEAK, VANCORANDOM, GENTTROUGH, GENTPEAK, GENTRANDOM, TOBRATROUGH, TOBRAPEAK, TOBRARND, AMIKACINPEAK, AMIKACINTROU, AMIKACIN in the last 72 hours.   Microbiology: Recent Results (from the past 720 hour(s))  Blood Culture (routine x 2)     Status: None (Preliminary result)   Collection Time: 02/05/15  6:45 PM  Result Value Ref Range Status   Specimen Description BLOOD LEFT ARM  Final   Special Requests BOTTLES DRAWN AEROBIC AND ANAEROBIC 5CC  Final   Culture   Final           BLOOD CULTURE RECEIVED NO GROWTH TO DATE CULTURE WILL BE HELD FOR 5 DAYS BEFORE ISSUING A FINAL NEGATIVE REPORT Performed at [facility]    Report Status PENDING  Incomplete  Blood Culture (routine x 2)     Status: None (Preliminary result)   Collection Time: 02/05/15  6:45 PM  Result Value Ref Range Status   Specimen Description BLOOD RIGHT FOREARM  Final   Special Requests BOTTLES DRAWN AEROBIC AND ANAEROBIC 5CC  Final   Culture   Final           BLOOD CULTURE RECEIVED  NO GROWTH TO DATE CULTURE WILL BE HELD FOR 5 DAYS BEFORE ISSUING A FINAL NEGATIVE REPORT Performed at [facility]    Report Status PENDING  Incomplete  Urine culture     Status: None   Collection Time: 02/05/15  6:52 PM  Result Value Ref Range Status   Specimen Description URINE, RANDOM  Final   Special Requests NONE  Final   Colony Count   Final    15,000 COLONIES/ML Performed at [facility]    Culture   Final    DIPHTHEROIDS(CORYNEBACTERIUM SPECIES) Note: Standardized susceptibility testing for this organism is not available. Performed at [facility]    Report Status 02/07/2015 FINAL  Final  MRSA PCR Screening     Status: None   Collection Time: 02/06/15  5:02 PM  Result Value Ref Range Status   MRSA by PCR NEGATIVE NEGATIVE Final    Comment:        The GeneXpert MRSA Assay (FDA approved for NASAL specimens only), is one component of a comprehensive MRSA colonization surveillance program. It is not intended to diagnose MRSA infection nor to guide or monitor treatment for MRSA infections.   Culture, blood (routine x 2)     Status: None (Preliminary result)   Collection Time: 02/07/15  2:07 PM  Result Value Ref Range Status   Specimen Description BLOOD LEFT ARM  Final   Special Requests   Final    BOTTLES DRAWN AEROBIC AND ANAEROBIC 10CC BOTH BOTTLES   Culture   Final           BLOOD CULTURE RECEIVED NO GROWTH TO DATE CULTURE WILL BE HELD FOR 5 DAYS BEFORE ISSUING A FINAL NEGATIVE REPORT Performed at [facility]    Report Status PENDING  Incomplete  Culture, blood (routine x 2)     Status: None (Preliminary result)   Collection Time: 02/07/15  2:14 PM  Result Value Ref Range Status   Specimen Description BLOOD RIGHT ARM  Final   Special Requests   Final    BOTTLES DRAWN AEROBIC AND ANAEROBIC 10CC BOTH BOTTLES   Culture   Final           BLOOD CULTURE RECEIVED NO GROWTH TO DATE CULTURE WILL BE HELD FOR 5 DAYS BEFORE ISSUING A  FINAL NEGATIVE REPORT Performed at [facility]    Report Status PENDING  Incomplete  Clostridium Difficile by PCR     Status: None   Collection Time: 02/07/15  4:10 PM  Result Value Ref Range Status   C difficile by pcr NEGATIVE NEGATIVE Final    Assessment 64 yo male with a hx of IDDM, HTN, MI (with angioplasty and stent placement on 06/05/12), gout, arthritis presents to the ED with gradual, persistent, progressively worsening SOB x 1 week.  Pharmacy to dose vancomycin/cefepime for pneumonia. Cefepime changed to Zosyn 4/17 with continued fevers on previous regimen; repeated cultures.  Tamiflu stopped w/ negative flu panel.  4/15 >> vanc >> 4/15 >> cefepime >> 4/17 4/17 >> zosyn (MD) >> 4/18 >> levofloxacin (MD) >>  Tmax: 103 yesterday PM, now afebrile WBC: sl elevated Renal: SCr recovering initially, now increasing again; CrCl 42 CG (UOP improved though) PCT 3.14 > 4.18 LA resolved 4/17  4/15 blood: ngtd x 2 41/5 urine: 15k corynebacterium 4/16 resp virus panel: IP 4/16 influenza panel: neg 4/17 blood: ngtd x 2 4/17 urine: sent 4/17 Cdiff: neg 4/17 Legionella UAg: IP   Dose adjustments/levels:   Goal of Therapy:   Vancomycin trough level 15-20 mcg/ml   Eradication of infection  Appropriate antibiotic dosing for indication and renal function  Plan:  Day 4 of antibiotics Continue vancomycin 1250 mg IV q24 hr Will check vancomycin trough tonight given obesity dosing and unstable renal function Zosyn/Levaquin dosing appropriate  Follow clinical course, renal function, culture results as available  Follow for de-escalation of antibiotics and LOT   Reuel Boom, PharmD Pager: [phone] 02/08/2015, 11:00 AM

## 2015-02-08 NOTE — Progress Notes (Signed)
PHARMACY - VANCOMYCIN  Day #4 Vancomycin for pneumonia (also on Zosyn and Levaquin) - full note written earlier today.  Patient currently on regimen of Vancomycin 1250mg  IV q24h.  Last dose charted as given on 4/18 @ 20:00.  Vancomycin Trough level @ 19:25 = 8 mcg/ml (goal 15-20 mcg/ml)  Plan:  Will increase Vancomycin to 1500mg  IV q24h           Recheck Vancomycin level when appropriate  Leone Haven, PharmD

## 2015-02-08 NOTE — Progress Notes (Signed)
Patient is A/Ox4. He is on oxygen at 5 L [place]. Patient experiences shortness of breath upon exertion. He had no complaints of pain. Patient had elevated temperature and prn Tylenol was given. HR and BP became elevated later during the shift. Prn medication given for blood pressure and was effective. HR began to increase and sustain in the 130's to 140's with elevated BP and temperature. Attending MD Dr. Carles Collet was called and notified. New orders were written.

## 2015-02-08 NOTE — Progress Notes (Signed)
I did not d/c isolation.  Pt was on droplet isolation on [address] [place] for pending respiratory viral panel and he was placed on contact isolation when protocol for C.diff was triggered by his loose stool while he was on [place].  He was transferred to stepdown unit with droplet and contact isolation in place and should remain in place pending further notice.  DTat

## 2015-02-09 LAB — RESPIRATORY VIRUS PANEL
Adenovirus: NEGATIVE
Influenza A: NEGATIVE
Influenza B: NEGATIVE
Metapneumovirus: NEGATIVE
Parainfluenza 1: NEGATIVE
Parainfluenza 2: NEGATIVE
Parainfluenza 3: NEGATIVE
Respiratory Syncytial Virus A: NEGATIVE
Respiratory Syncytial Virus B: NEGATIVE
Rhinovirus: NEGATIVE

## 2015-02-09 LAB — COMPREHENSIVE METABOLIC PANEL
ALT: 65 U/L — ABNORMAL HIGH (ref 0–53)
AST: 78 U/L — ABNORMAL HIGH (ref 0–37)
Albumin: 2.7 g/dL — ABNORMAL LOW (ref 3.5–5.2)
Alkaline Phosphatase: 196 U/L — ABNORMAL HIGH (ref 39–117)
Anion gap: 12 (ref 5–15)
BUN: 40 mg/dL — ABNORMAL HIGH (ref 6–23)
CO2: 25 mmol/L (ref 19–32)
Calcium: 8 mg/dL — ABNORMAL LOW (ref 8.4–10.5)
Chloride: 98 mmol/L (ref 96–112)
Creatinine, Ser: 2.39 mg/dL — ABNORMAL HIGH (ref 0.50–1.35)
GFR calc Af Amer: 31 mL/min — ABNORMAL LOW (ref >90)
GFR calc non Af Amer: 27 mL/min — ABNORMAL LOW (ref >90)
Glucose, Bld: 155 mg/dL — ABNORMAL HIGH (ref 70–99)
Potassium: 4.2 mmol/L (ref 3.5–5.1)
Sodium: 135 mmol/L (ref 135–145)
Total Bilirubin: 2.3 mg/dL — ABNORMAL HIGH (ref 0.3–1.2)
Total Protein: 7.3 g/dL (ref 6.0–8.3)

## 2015-02-09 LAB — CBC WITH DIFFERENTIAL/PLATELET
Basophils Absolute: 0 10*3/uL (ref 0.0–0.1)
Basophils Relative: 0 % (ref 0–1)
Eosinophils Absolute: 0.1 10*3/uL (ref 0.0–0.7)
Eosinophils Relative: 1 % (ref 0–5)
HCT: 37.8 % — ABNORMAL LOW (ref 39.0–52.0)
Hemoglobin: 12.1 g/dL — ABNORMAL LOW (ref 13.0–17.0)
Lymphocytes Relative: 9 % — ABNORMAL LOW (ref 12–46)
Lymphs Abs: 1.2 10*3/uL (ref 0.7–4.0)
MCH: 28.9 pg (ref 26.0–34.0)
MCHC: 32 g/dL (ref 30.0–36.0)
MCV: 90.2 fL (ref 78.0–100.0)
Monocytes Absolute: 0.7 10*3/uL (ref 0.1–1.0)
Monocytes Relative: 6 % (ref 3–12)
Neutro Abs: 10.9 10*3/uL — ABNORMAL HIGH (ref 1.7–7.7)
Neutrophils Relative %: 84 % — ABNORMAL HIGH (ref 43–77)
Platelets: 292 10*3/uL (ref 150–400)
RBC: 4.19 MIL/uL — ABNORMAL LOW (ref 4.22–5.81)
RDW: 14.9 % (ref 11.5–15.5)
WBC: 12.9 10*3/uL — ABNORMAL HIGH (ref 4.0–10.5)

## 2015-02-09 LAB — GLUCOSE, CAPILLARY
Glucose-Capillary: 133 mg/dL — ABNORMAL HIGH (ref 70–99)
Glucose-Capillary: 161 mg/dL — ABNORMAL HIGH (ref 70–99)
Glucose-Capillary: 158 mg/dL — ABNORMAL HIGH (ref 70–99)
Glucose-Capillary: 182 mg/dL — ABNORMAL HIGH (ref 70–99)
Glucose-Capillary: 137 mg/dL — ABNORMAL HIGH (ref 70–99)
Glucose-Capillary: 104 mg/dL — ABNORMAL HIGH (ref 70–99)

## 2015-02-09 LAB — LEGIONELLA ANTIGEN, URINE

## 2015-02-09 MED ORDER — SODIUM CHLORIDE 0.9 % IV SOLN
INTRAVENOUS | Status: DC
  Administered 2015-02-09: 16:00:00 via INTRAVENOUS

## 2015-02-09 MED ORDER — DOCUSATE SODIUM 100 MG PO CAPS
100.0000 mg | ORAL_CAPSULE | Freq: Two times a day (BID) | ORAL | Status: DC
  Administered 2015-02-09 – 2015-02-13 (×8): 100 mg via ORAL
  Filled 2015-02-09 (×10): qty 1

## 2015-02-09 MED ORDER — BISACODYL 10 MG RE SUPP
10.0000 mg | Freq: Once | RECTAL | Status: AC
  Administered 2015-02-09: 10 mg via RECTAL
  Filled 2015-02-09: qty 1

## 2015-02-09 MED ORDER — METOPROLOL TARTRATE 50 MG PO TABS
50.0000 mg | ORAL_TABLET | Freq: Two times a day (BID) | ORAL | Status: DC
  Administered 2015-02-09 – 2015-02-11 (×4): 50 mg via ORAL
  Filled 2015-02-09: qty 1
  Filled 2015-02-09: qty 2
  Filled 2015-02-09: qty 1
  Filled 2015-02-09: qty 2
  Filled 2015-02-09: qty 1
  Filled 2015-02-09: qty 2
  Filled 2015-02-09: qty 1

## 2015-02-09 NOTE — Progress Notes (Signed)
PROGRESS NOTE  Gregory [name] IWL:[id number] DOB: [birth year] DOA: 02/05/2015 PCP: Kandice Hams, MD  Brief History 64 year old male 64 year old male with a history of diabetes mellitus, prostate cancer hypertension, coronary artery disease, gouty arthritis presents with one-week history of progressive shortness of breath, coughing, or lightheadedness. The patient had a fever of 104.2F in the emergency department with associated tachycardia, tachypnea and hypoxemia. The patient was admitted for sepsis workup. Working diagnosis was pneumonia, and the patient was started on vancomycin and cefepime. Influenza PCR was negative, but respiratory viral panel was obtained. During the course of the day on 02/06/2015, the patient developed progressive dyspnea. Repeat chest x-ray showed increase in interstitial thickening particularly at the lung bases. BNP was 91 and repeat lactic acid was 1.2. The patient was clinical stable when he was initially seen at 0900, but he developed progressive dyspnea prompting transfer to the stepdown unit.  Assessment/Plan: Acute hypoxemic respiratory failure -multifactorial including pulmonary edema?, interstitial pneumonitis, lobar pneumonia -Continue broad-spectrum IV antibiotics -02/06/2015 chest x-ray showed increasing interstitial thickening -mildly Improved breathing/decreased tachypnea with furosemide IV 02/06/2015 -02/07/15 redosed IV furosemide 40mg  -wean to 4L [place] today -Chek procalcitonin 3.14-->4.18 -Xopenex and Atrovent nebs -Influenza negative -Respiratory viral panel--pending -02/10/15 repeat CXR  Pneumonia -02/08/15 add levofloxacin to cover atypicals-->no fever for 24 hours -hesitate to add azithro (cause cholestasis) due to already elevated LFTs -urine legionella antigen--neg -02/09/15--D#5 total abx, D#2 levoflox -can likely d/c vanco 4/20 if cultures remain neg  Sepsis -present at the time of admission -due to pneumonia -Repeat  lactic acid 0.8  -check procalcitonin--3.14-->4.18 -Continue IV vancomycin and zosyn (cefepime to zosyn on 4/17) -Influenza PCR negative--> discontinue oseltamivir  -4/18--repeat CXR--increased interstitial marking -4/17--abdominal xray--nonspecific bowel gas -because of persistent fever CT chest/abd/pelvis ordered -02/07/2015 CT chest--paratracheal lymph node 11 mm, trace right pleural effusion -02/07/2015 CT abdomen and pelvis--no bowel obstruction, negative gallbladder and liver, mild perinephric stranding, 4.5x3cm ovoid lesion left external iliac chain--possible lymphocele -02/05/2015 +02/07/15 blood cultures---Neg  Transaminasemia/Hyperbilirubinemia -suspect due to sepsis and cholestasis -RUQ ultrasound--neg  Acute on chronic renal failure (CKD 2-3) -Secondary to sepsis  -pt initially felt to have pulmonary edema-->given lasix 40mg  IV x 2 (4/16 & 4/17)-->worsen renal function -02/10/15--will give 1L NS -Monitor renal function closely  Diabetes mellitus type 2  -Decreased to Lantus 10 units  -NovoLog sliding scale  Coronary artery disease  -Continue metoprolol tartrate  -Continue Plavix hypertension  -Continue metoprolol tartrate--increase to 50mg  bid  Tremor -resting tremor -pt can suppress on command -suspect a component of shivering and rigors as well as worsening due to acute medical ilnness LUE edema -venous duplex--neg  Family Communication: no family at bedside Disposition Plan: transfer to tele 4/20 if stable          Procedures/Studies: Ct Abdomen Pelvis Wo Contrast  02/07/2015   CLINICAL DATA:  Diarrhea, fever, cough, and worsening shortness of breath for 1 week. Sepsis and acute renal failure. History of prostate cancer.  EXAM: CT CHEST, ABDOMEN AND PELVIS WITHOUT CONTRAST  TECHNIQUE: Multidetector CT imaging of the chest, abdomen and pelvis was performed following the standard protocol without IV contrast.  COMPARISON:  Three-way abdominal  series earlier today. CT pelvis 12/15/2013.  FINDINGS: CT CHEST FINDINGS  Mildly enlarged right paratracheal lymph nodes measure up to 11 mm in short axis, most likely reactive. No definite enlarged hilar lymph nodes are identified, however evaluation is limited given lack of IV contrast. Three-vessel coronary artery calcification is  noted. The heart is mildly enlarged. There is a trace right pleural effusion. There is no pericardial effusion.  Major airways are patent. There is consolidation with air bronchograms involving much of the right lower lobe. Minimal opacity in the left lower lobe likely represents atelectasis. No lytic or blastic osseous lesions are identified.  CT ABDOMEN AND PELVIS FINDINGS  The liver, gallbladder, spleen, adrenal glands, and pancreas have an unremarkable unenhanced appearance. Small hypodensities in the kidneys are most compatible with cysts, measuring up to 1.2 cm on the right and 2.3 cm on the left. There is mild, symmetric perinephric stranding.  Oral contrast is present in distal small bowel and colon to the level of the proximal sigmoid colon without evidence of obstruction. There is mild colonic diverticulosis without evidence of diverticulitis. The prostate is absent. Bladder is unremarkable. Diffuse aortoiliac atherosclerotic calcification is noted.  There is an oval low-density lesion measuring 4.5 x 3.0 cm along the left external iliac chain, new from the prior CT. A similar but smaller lesion along the right external iliac chain is also new, measuring 2.0 x 1.3 cm. No enlarged lymph nodes are identified elsewhere in the abdomen or pelvis. There is no free fluid. There is atrophy of the left psoas muscle. Degenerative changes are again noted in the thoracolumbar spine, hips, and SI joints.  IMPRESSION: 1. Right lower lobe pneumonia. 2. No acute abnormality identified in the abdomen or pelvis. 3. Two new low-density lesions along the external iliac chains, likely small  lymphoceles.   Electronically Signed   By: Logan Bores   On: 02/07/2015 19:14   Ct Chest Wo Contrast  02/07/2015   CLINICAL DATA:  Diarrhea, fever, cough, and worsening shortness of breath for 1 week. Sepsis and acute renal failure. History of prostate cancer.  EXAM: CT CHEST, ABDOMEN AND PELVIS WITHOUT CONTRAST  TECHNIQUE: Multidetector CT imaging of the chest, abdomen and pelvis was performed following the standard protocol without IV contrast.  COMPARISON:  Three-way abdominal series earlier today. CT pelvis 12/15/2013.  FINDINGS: CT CHEST FINDINGS  Mildly enlarged right paratracheal lymph nodes measure up to 11 mm in short axis, most likely reactive. No definite enlarged hilar lymph nodes are identified, however evaluation is limited given lack of IV contrast. Three-vessel coronary artery calcification is noted. The heart is mildly enlarged. There is a trace right pleural effusion. There is no pericardial effusion.  Major airways are patent. There is consolidation with air bronchograms involving much of the right lower lobe. Minimal opacity in the left lower lobe likely represents atelectasis. No lytic or blastic osseous lesions are identified.  CT ABDOMEN AND PELVIS FINDINGS  The liver, gallbladder, spleen, adrenal glands, and pancreas have an unremarkable unenhanced appearance. Small hypodensities in the kidneys are most compatible with cysts, measuring up to 1.2 cm on the right and 2.3 cm on the left. There is mild, symmetric perinephric stranding.  Oral contrast is present in distal small bowel and colon to the level of the proximal sigmoid colon without evidence of obstruction. There is mild colonic diverticulosis without evidence of diverticulitis. The prostate is absent. Bladder is unremarkable. Diffuse aortoiliac atherosclerotic calcification is noted.  There is an oval low-density lesion measuring 4.5 x 3.0 cm along the left external iliac chain, new from the prior CT. A similar but smaller lesion  along the right external iliac chain is also new, measuring 2.0 x 1.3 cm. No enlarged lymph nodes are identified elsewhere in the abdomen or pelvis. There is no  free fluid. There is atrophy of the left psoas muscle. Degenerative changes are again noted in the thoracolumbar spine, hips, and SI joints.  IMPRESSION: 1. Right lower lobe pneumonia. 2. No acute abnormality identified in the abdomen or pelvis. 3. Two new low-density lesions along the external iliac chains, likely small lymphoceles.   Electronically Signed   By: Logan Bores   On: 02/07/2015 19:14   Dg Chest Port 1 View  02/08/2015   CLINICAL DATA:  Respiratory failure.  EXAM: PORTABLE CHEST - 1 VIEW  COMPARISON:  CT 02/07/2015.  Chest x-ray 02/06/2015.  FINDINGS: Cardiomegaly with pulmonary venous congestion and bilateral infiltrates. Small right pleural effusion. These findings are consistent with congestive heart failure. Underlying pneumonia particularly in the right lower lobe cannot be excluded. No pneumothorax. No acute bony abnormality.  IMPRESSION: 1. Congestive heart failure with bilateral pulmonary edema and small right pleural effusion. 2. Persistent underlying right lower lobe pneumonia cannot be excluded .   Electronically Signed   By: Marcello Moores  Register   On: 02/08/2015 07:19   Dg Chest Port 1 View  02/06/2015   CLINICAL DATA:  Worsening dyspnea and fever. Short of breath today. History of hypertension.  EXAM: PORTABLE CHEST - 1 VIEW  COMPARISON:  02/05/2015.  FINDINGS: Since prior exam, bilateral interstitial thickening has increased. Mild hazy airspace opacity is noted in the medial right lung base and lateral left lung base. There is no convincing pleural effusion and no pneumothorax.  Cardiac silhouette is mildly enlarged. No mediastinal or hilar masses.  Bony thorax is grossly intact.  IMPRESSION: 1. Increased interstitial thickening when compared the prior study and with mild hazy airspace opacity at the lung bases. Findings could  reflect pulmonary edema. However, given the history of fever, diffuse interstitial pneumonia is suspected.   Electronically Signed   By: Lajean Manes M.D.   On: 02/06/2015 09:27   Dg Chest Port 1 View  (if Code Sepsis Called)  02/05/2015   CLINICAL DATA:  Shortness of breath. Dizziness. Shortness of breath for 2 days. Dizziness is today. History of diabetes.  EXAM: PORTABLE CHEST - 1 VIEW  COMPARISON:  12/15/2013  FINDINGS: Cardiac silhouette normal in size configuration. No mediastinal or hilar masses. Lungs are clear. No pleural effusion or pneumothorax.  Bony thorax is intact.  IMPRESSION: No acute cardiopulmonary disease.   Electronically Signed   By: Lajean Manes M.D.   On: 02/05/2015 19:15   Dg Abd Acute W/chest  02/07/2015   CLINICAL DATA:  64 year old male with a history of shortness of breath.  EXAM: DG ABDOMEN ACUTE W/ 1V CHEST  COMPARISON:  Chest x-ray 02/06/2015, CT pelvis 12/15/2013  FINDINGS: Chest:  Cardiomediastinal silhouette unchanged in size and contour with cardiomegaly. Mediastinal border somewhat obscures secondary to the apical lordotic positioning.  Pulmonary vascular congestion.  Linear and patchy opacities at the bilateral lung bases with obscuration of the retrocardiac region. No pneumothorax.  Decubitus chest image demonstrates no layering pleural effusion along the right mediastinum.  Abdomen:  Small bowel loops and colon filled with gas. Borderline dilation of central small bowel loops. Body habitus limits evaluation the abdomen.  Decubitus image limited by respiratory motion, though there are air-fluid levels present.  No displaced fracture.  IMPRESSION: Chest:  Linear and patchy opacities at the lung bases may reflect atelectasis, edema, and/or consolidation. No layering pleural effusion.  Abdomen:  Limited abdominal plain film demonstrates nonspecific bowel gas pattern with borderline dilated small bowel and several air-fluid levels. If there  is concern for acute  intra-abdominal process, CT would be recommended.  Signed,  Dulcy Fanny. Earleen Newport, DO  [facility]  [facility]   Electronically Signed   By: Corrie Mckusick D.O.   On: 02/07/2015 11:51   US Abdomen Limited Ruq  02/08/2015   CLINICAL DATA:  Initial encounter for elevated LFTs with hyperbilirubinemia  EXAM: US ABDOMEN LIMITED - RIGHT UPPER QUADRANT  COMPARISON:  CT scan 02/07/2015  FINDINGS: Gallbladder:  No gallstones or wall thickening visualized. No sonographic Murphy sign noted.  Common bile duct:  Diameter: Nondilated at 3 mm diameter.  Liver:  No focal lesion identified. Within normal limits in parenchymal echogenicity.  Other: Incidental imaging of the right kidney shows increased echogenicity. 14 mm hypoechoic structure near the upper pole is likely a cyst.  IMPRESSION: No evidence for biliary dilatation.  No gallstones.   Electronically Signed   By: Misty Stanley M.D.   On: 02/08/2015 15:24        Subjective: Patient is feeling better. He does have some intermittent shortness of breath. He complains of constipation. Denies any chest pain, nausea, vomiting, diarrhea, headache, dysuria, hematuria.  Objective: Filed Vitals:   02/09/15 1400 02/09/15 1407 02/09/15 1500 02/09/15 1600  BP: 165/96  159/96   Pulse: 98  94   Temp:    98 F (36.7 C)  TempSrc:    Oral  Resp: 30  24   Height:      Weight:      SpO2: 96% 95% 97%     Intake/Output Summary (Last 24 hours) at 02/09/15 1830 Last data filed at 02/09/15 1700  Gross per 24 hour  Intake 1183.75 ml  Output   1425 ml  Net -241.25 ml   Weight change: -1.3 kg (-2 lb 13.9 oz) Exam:   General:  Pt is alert, follows commands appropriately, not in acute distress  HEENT: No icterus, No thrush, No meningismus, [place]/AT  Cardiovascular: RRR, S1/S2, no rubs, no gallops  Respiratory: Bilateral rales, right greater than left. No wheezing.  Abdomen: Soft/+BS, non tender, non distended, no  guarding  Extremities: No edema, No lymphangitis, No petechiae, No rashes, no synovitis  Data Reviewed: Basic Metabolic Panel:  Recent Labs Lab 02/06/15 0533 02/06/15 1627 02/07/15 0932 02/08/15 0412 02/09/15 0840  NA 136 138 135 135 135  K 4.0 3.8 4.1 4.3 4.2  CL 104 106 102 99 98  CO2 23 23 25 24 25   GLUCOSE 163* 159* 248* 139* 155*  BUN 25* 23 27* 29* 40*  CREATININE 1.62* 1.58* 1.72* 1.99* 2.39*  CALCIUM 7.7* 7.6* 7.6* 7.8* 8.0*  MG 2.3  --   --   --   --   PHOS 3.7  --   --   --   --    Liver Function Tests:  Recent Labs Lab 02/06/15 0533 02/06/15 1627 02/07/15 0932 02/08/15 0412 02/09/15 0840  AST 38* 45* 53* 76* 78*  ALT 29 35 42 56* 65*  ALKPHOS 85 108 132* 180* 196*  BILITOT 1.4* 1.4* 1.3* 1.9* 2.3*  PROT 6.7 6.6 6.6 6.9 7.3  ALBUMIN 3.0* 2.9* 2.8* 2.8* 2.7*   No results for input(s): LIPASE, AMYLASE in the last 168 hours. No results for input(s): AMMONIA in the last 168 hours. CBC:  Recent Labs Lab 02/05/15 1845 02/06/15 0533 02/06/15 1627 02/07/15 0932 02/08/15 0412 02/09/15 0840  WBC 10.0 10.4 9.2 11.1* 11.4* 12.9*  NEUTROABS 8.6*  --   --   --   --  10.9*  HGB 13.8 13.0 12.4* 12.3* 12.8* 12.1*  HCT 41.3 38.5* 38.3* 37.6* 39.2 37.8*  MCV 89.2 90.0 89.1 90.4 90.1 90.2  PLT 181 171 181 204 220 292   Cardiac Enzymes: No results for input(s): CKTOTAL, CKMB, CKMBINDEX, TROPONINI in the last 168 hours. BNP: Invalid input(s): POCBNP CBG:  Recent Labs Lab 02/08/15 2133 02/08/15 2154 02/09/15 0814 02/09/15 1243 02/09/15 1529  GLUCAP 133* 161* 158* 182* 137*    Recent Results (from the past 240 hour(s))  Blood Culture (routine x 2)     Status: None (Preliminary result)   Collection Time: 02/05/15  6:45 PM  Result Value Ref Range Status   Specimen Description BLOOD LEFT ARM  Final   Special Requests BOTTLES DRAWN AEROBIC AND ANAEROBIC 5CC  Final   Culture   Final           BLOOD CULTURE RECEIVED NO GROWTH TO DATE CULTURE WILL BE HELD  FOR 5 DAYS BEFORE ISSUING A FINAL NEGATIVE REPORT Performed at [facility]    Report Status PENDING  Incomplete  Blood Culture (routine x 2)     Status: None (Preliminary result)   Collection Time: 02/05/15  6:45 PM  Result Value Ref Range Status   Specimen Description BLOOD RIGHT FOREARM  Final   Special Requests BOTTLES DRAWN AEROBIC AND ANAEROBIC 5CC  Final   Culture   Final           BLOOD CULTURE RECEIVED NO GROWTH TO DATE CULTURE WILL BE HELD FOR 5 DAYS BEFORE ISSUING A FINAL NEGATIVE REPORT Performed at [facility]    Report Status PENDING  Incomplete  Urine culture     Status: None   Collection Time: 02/05/15  6:52 PM  Result Value Ref Range Status   Specimen Description URINE, RANDOM  Final   Special Requests NONE  Final   Colony Count   Final    15,000 COLONIES/ML Performed at [facility]    Culture   Final    DIPHTHEROIDS(CORYNEBACTERIUM SPECIES) Note: Standardized susceptibility testing for this organism is not available. Performed at [facility]    Report Status 02/07/2015 FINAL  Final  MRSA PCR Screening     Status: None   Collection Time: 02/06/15  5:02 PM  Result Value Ref Range Status   MRSA by PCR NEGATIVE NEGATIVE Final    Comment:        The GeneXpert MRSA Assay (FDA approved for NASAL specimens only), is one component of a comprehensive MRSA colonization surveillance program. It is not intended to diagnose MRSA infection nor to guide or monitor treatment for MRSA infections.   Culture, blood (routine x 2)     Status: None (Preliminary result)   Collection Time: 02/07/15  2:07 PM  Result Value Ref Range Status   Specimen Description BLOOD LEFT ARM  Final   Special Requests   Final    BOTTLES DRAWN AEROBIC AND ANAEROBIC 10CC BOTH BOTTLES   Culture   Final           BLOOD CULTURE RECEIVED NO GROWTH TO DATE CULTURE WILL BE HELD FOR 5 DAYS BEFORE ISSUING A FINAL NEGATIVE REPORT Performed at [facility]     Report Status PENDING  Incomplete  Urine culture     Status: None   Collection Time: 02/07/15  2:09 PM  Result Value Ref Range Status   Specimen Description URINE, RANDOM  Final   Special Requests NONE  Final  Colony Count NO GROWTH Performed at [facility]   Final   Culture NO GROWTH Performed at [facility]   Final   Report Status 02/08/2015 FINAL  Final  Culture, blood (routine x 2)     Status: None (Preliminary result)   Collection Time: 02/07/15  2:14 PM  Result Value Ref Range Status   Specimen Description BLOOD RIGHT ARM  Final   Special Requests   Final    BOTTLES DRAWN AEROBIC AND ANAEROBIC 10CC BOTH BOTTLES   Culture   Final           BLOOD CULTURE RECEIVED NO GROWTH TO DATE CULTURE WILL BE HELD FOR 5 DAYS BEFORE ISSUING A FINAL NEGATIVE REPORT Performed at [facility]    Report Status PENDING  Incomplete  Clostridium Difficile by PCR     Status: None   Collection Time: 02/07/15  4:10 PM  Result Value Ref Range Status   C difficile by pcr NEGATIVE NEGATIVE Final     Scheduled Meds: . clopidogrel  75 mg Oral Daily  . docusate sodium  100 mg Oral BID  . enoxaparin (LOVENOX) injection  0.5 mg/kg Subcutaneous Daily  . guaiFENesin  600 mg Oral BID  . insulin aspart  0-15 Units Subcutaneous TID WC  . insulin aspart  0-5 Units Subcutaneous QHS  . insulin glargine  10 Units Subcutaneous q morning - 10a  . ipratropium  0.5 mg Nebulization TID  . levalbuterol  0.63 mg Nebulization TID  . levofloxacin  500 mg Oral Daily  . metoprolol tartrate  50 mg Oral BID  . piperacillin-tazobactam  3.375 g Intravenous 3 times per day  . sodium chloride  3 mL Intravenous Q12H  . vancomycin  1,500 mg Intravenous Q24H   Continuous Infusions: . sodium chloride 10 mL/hr at 02/08/15 0921  . sodium chloride 75 mL/hr at 02/09/15 1609     Jermell Holeman, DO  [facility] Pager [phone]  If 7PM-7AM, please contact  night-coverage [email or web address] Password [email or web address] 02/09/2015, 6:30 PM   LOS: 4 days

## 2015-02-09 NOTE — Progress Notes (Signed)
CARE MANAGEMENT NOTE 02/09/2015  Patient:  Gregory [name], Gregory [name]   Account Number:  [id number]  Date Initiated:  02/09/2015  Documentation initiated by:  DAVIS,RHONDA  Subjective/Objective Assessment:   transferred to sdu on [id number] pm due to increased wob and dyspnea.     Action/Plan:   home when stable   Anticipated DC Date:  02/12/2015   Anticipated DC Plan:  HOME/SELF CARE  In-house referral  NA      DC Planning Services  NA      [facility] [place] [facility] Choice  NA   Choice offered to / List presented to:  NA           Status of service:  In process, will continue to follow Medicare Important Message given?   (If response is "NO", the following Medicare IM given date fields will be blank) Date Medicare IM given:   Medicare IM given by:   Date Additional Medicare IM given:   Additional Medicare IM given by:    Discharge Disposition:    Per UR Regulation:  Reviewed for med. necessity/level of care/duration of stay  If discussed at [place] of Stay Meetings, dates discussed:    Comments:  February 09, 2015/Rhonda L. Rosana Hoes, RN, BSN, CCM. Case Management [place] [phone] No discharge needs present of time of review.

## 2015-02-10 ENCOUNTER — Inpatient Hospital Stay (HOSPITAL_COMMUNITY): Payer: 59

## 2015-02-10 DIAGNOSIS — J189 Pneumonia, unspecified organism: Secondary | ICD-10-CM

## 2015-02-10 LAB — GLUCOSE, CAPILLARY
Glucose-Capillary: 127 mg/dL — ABNORMAL HIGH (ref 70–99)
Glucose-Capillary: 230 mg/dL — ABNORMAL HIGH (ref 70–99)
Glucose-Capillary: 196 mg/dL — ABNORMAL HIGH (ref 70–99)
Glucose-Capillary: 179 mg/dL — ABNORMAL HIGH (ref 70–99)

## 2015-02-10 LAB — BASIC METABOLIC PANEL
Anion gap: 12 (ref 5–15)
BUN: 45 mg/dL — ABNORMAL HIGH (ref 6–23)
CO2: 25 mmol/L (ref 19–32)
Calcium: 8.2 mg/dL — ABNORMAL LOW (ref 8.4–10.5)
Chloride: 101 mmol/L (ref 96–112)
Creatinine, Ser: 2.32 mg/dL — ABNORMAL HIGH (ref 0.50–1.35)
GFR calc Af Amer: 32 mL/min — ABNORMAL LOW (ref >90)
GFR calc non Af Amer: 28 mL/min — ABNORMAL LOW (ref >90)
Glucose, Bld: 117 mg/dL — ABNORMAL HIGH (ref 70–99)
Potassium: 4 mmol/L (ref 3.5–5.1)
Sodium: 138 mmol/L (ref 135–145)

## 2015-02-10 LAB — CBC
HCT: 36.1 % — ABNORMAL LOW (ref 39.0–52.0)
Hemoglobin: 11.7 g/dL — ABNORMAL LOW (ref 13.0–17.0)
MCH: 29.1 pg (ref 26.0–34.0)
MCHC: 32.4 g/dL (ref 30.0–36.0)
MCV: 89.8 fL (ref 78.0–100.0)
Platelets: 303 10*3/uL (ref 150–400)
RBC: 4.02 MIL/uL — ABNORMAL LOW (ref 4.22–5.81)
RDW: 14.9 % (ref 11.5–15.5)
WBC: 12.4 10*3/uL — ABNORMAL HIGH (ref 4.0–10.5)

## 2015-02-10 LAB — PROCALCITONIN: Procalcitonin: 3.76 ng/mL

## 2015-02-10 MED ORDER — AMLODIPINE BESYLATE 10 MG PO TABS
10.0000 mg | ORAL_TABLET | Freq: Every day | ORAL | Status: DC
  Administered 2015-02-10 – 2015-02-13 (×4): 10 mg via ORAL
  Filled 2015-02-10 (×4): qty 1

## 2015-02-10 MED ORDER — INSULIN ASPART 100 UNIT/ML ~~LOC~~ SOLN
0.0000 [IU] | Freq: Three times a day (TID) | SUBCUTANEOUS | Status: DC
  Administered 2015-02-10 – 2015-02-11 (×2): 2 [IU] via SUBCUTANEOUS
  Administered 2015-02-11: 1 [IU] via SUBCUTANEOUS
  Administered 2015-02-11: 3 [IU] via SUBCUTANEOUS
  Administered 2015-02-12: 2 [IU] via SUBCUTANEOUS
  Administered 2015-02-12 – 2015-02-13 (×3): 1 [IU] via SUBCUTANEOUS

## 2015-02-10 MED ORDER — SODIUM CHLORIDE 0.9 % IV SOLN: INTRAVENOUS | Status: AC

## 2015-02-10 MED ORDER — INSULIN ASPART 100 UNIT/ML ~~LOC~~ SOLN: 0.0000 [IU] | Freq: Every day | SUBCUTANEOUS | Status: DC

## 2015-02-10 NOTE — Progress Notes (Signed)
PROGRESS NOTE    Gregory [name]:[id number] DOB: August 26, 1951 DOA: 02/05/2015 PCP: Kandice Hams, MD  HPI/Brief narrative 64 year old male with a history of DJ4/HFWY with renal complications, prostate cancer, hypertension, coronary artery disease, gouty arthritis presented with one-week history of progressive shortness of breath, coughing, and lightheadedness. He had a fever of 104.57F in the emergency department with associated tachycardia, tachypnea and hypoxemia. He was admitted for sepsis workup. Working diagnosis was pneumonia, and the patient was started on vancomycin and cefepime. Influenza PCR was negative. On 02/06/2015, the patient developed progressive dyspnea and was transferred to the stepdown unit. On 02/10/15, patient noted to be stable, no fevers since 4/18, cultures thus far negative. Patient will be transferred to telemetry.   Assessment/Plan:  Acute hypoxemic respiratory failure - likely due community-acquired right lower lobe pneumonia and less likely from pulmonary edema - Treated empirically with broad-spectrum IV antibiotics including IV vancomycin, IV cefepime (changed to Zosyn) and subsequently levofloxacin added - Blood cultures 4: Negative to date  - flu panel PCR and RSV panel negative - 02/06/2015 chest x-ray showed increasing interstitial thickening > treated with 2 doses of Lasix - Chek procalcitonin 3.14-->4.18 > 3.76 - 02/10/15 repeat CXR shows right lower lobe consolidation  - On 4/20, DC vancomycin and continue Zosyn + levofloxacin. In the next 24 hours, consider stopping Zosyn   Community-acquired right lower lobe pneumonia - Antibiotic management as indicated above - We'll need follow-up chest x-ray in 4-6 weeks to ensure resolution of pneumonia findings - Blood cultures 4: Negative to date - Urine legionella antigen negative  Sepsis, present on admission  -due to pneumonia -Repeat lactic acid 0.8  -check procalcitonin--3.14-->4.18 >3.76    - Extensive workup done and unremarkable except for pneumonia  - Blood cultures 4: Negative to date  - Urine culture: Negative  - Flu panel PCR: Negative  - RSV panel: Negative - C. difficile PCR: Negative  - CT abdomen and pelvis without contrast: No acute findings except for right lower lobe pneumonia   Transaminasemia/Hyperbilirubinemia -suspect due to sepsis and cholestasis -RUQ ultrasound--neg - Denies GI symptoms.  - Stable. Follow LFTs   Acute on chronic renal failure (CKD 2-3) - Secondary to sepsis, ARB at home & ? NSAID's at home  - pt initially felt to have pulmonary edema-->given lasix 40mg  IV x 2 (4/16 & 4/17)-->worsen renal function - Creatinine in November 2015: 1.24. Admitting creatinine 1.89. Baseline creatinine may be in the 1.4-1.7 range - Acute renal failure possibly from Lasix complicating underlying chronic kidney disease - Hold nephrotoxic. - Brief gentle IV fluids and follow BMP closely  Diabetes mellitus type 2  -Decreased to Lantus 10 units  -NovoLog sliding scale  - Good inpatient control   Coronary artery disease  -Continue metoprolol tartrate  -Continue Plavix  Hypertension  -Continue metoprolol tartrate--increase to 50mg  bid  - Added amlodipine   Tremor -resting tremor -pt can suppress on command -suspect a component of shivering and rigors as well as worsening due to acute medical ilnness  LUE edema - venous duplex--neg   Code Status: Full Family Communication: None at bedside Disposition Plan: Transfer to Tele. DC home possibly in 3-4 days.   Consultants:  None  Procedures:  None  Antibiotics:  IV Cefepime 4/15> 4/16   Levofloxacin oral 4/18 >  Tamiflu 4/15 > 4/16   IV Zosyn 4/17 >  IV vancomycin 4/15 > 4/20    Subjective: Patient states that he feels fine. Denies chest pain, dyspnea or cough.  No fever since 4/18.  Objective: Filed Vitals:   02/10/15 0900 02/10/15 1000 02/10/15 1100 02/10/15 1200  BP:  156/93 164/83 137/86   Pulse: 88 103 87   Temp:    97.9 F (36.6 C)  TempSrc:    Oral  Resp: 26 25 26    Height:      Weight:      SpO2: 92% 91% 93%     Intake/Output Summary (Last 24 hours) at 02/10/15 1321 Last data filed at 02/10/15 1237  Gross per 24 hour  Intake 2703.75 ml  Output   1850 ml  Net 853.75 ml   Filed Weights   02/08/15 0500 02/09/15 0500 02/10/15 0451  Weight: 100.3 kg (221 lb 1.9 oz) 98.5 kg (217 lb 2.5 oz) 98.7 kg (217 lb 9.5 oz)     Exam:  General exam: Pleasant middle-aged male lying comfortably propped up in bed Respiratory system:  right basal bronchial breath sounds with occasional crackles. Rest of lung fields clear to auscultation . No increased work of breathing. Cardiovascular system: S1 & S2 heard, RRR. No JVD, murmurs, gallops, clicks or pedal edema. telemetry: SR-ST 100's Gastrointestinal system: Abdomen is nondistended, soft and nontender. Normal bowel sounds heard. Central nervous system: Alert and oriented. No focal neurological deficits. Extremities: Symmetric 5 x 5 power.   Data Reviewed: Basic Metabolic Panel:  Recent Labs Lab 02/06/15 0533 02/06/15 1627 02/07/15 0932 02/08/15 0412 02/09/15 0840 02/10/15 0329  NA 136 138 135 135 135 138  K 4.0 3.8 4.1 4.3 4.2 4.0  CL 104 106 102 99 98 101  CO2 23 23 25 24 25 25   GLUCOSE 163* 159* 248* 139* 155* 117*  BUN 25* 23 27* 29* 40* 45*  CREATININE 1.62* 1.58* 1.72* 1.99* 2.39* 2.32*  CALCIUM 7.7* 7.6* 7.6* 7.8* 8.0* 8.2*  MG 2.3  --   --   --   --   --   PHOS 3.7  --   --   --   --   --    Liver Function Tests:  Recent Labs Lab 02/06/15 0533 02/06/15 1627 02/07/15 0932 02/08/15 0412 02/09/15 0840  AST 38* 45* 53* 76* 78*  ALT 29 35 42 56* 65*  ALKPHOS 85 108 132* 180* 196*  BILITOT 1.4* 1.4* 1.3* 1.9* 2.3*  PROT 6.7 6.6 6.6 6.9 7.3  ALBUMIN 3.0* 2.9* 2.8* 2.8* 2.7*   No results for input(s): LIPASE, AMYLASE in the last 168 hours. No results for input(s): AMMONIA in  the last 168 hours. CBC:  Recent Labs Lab 02/05/15 1845  02/06/15 1627 02/07/15 0932 02/08/15 0412 02/09/15 0840 02/10/15 0329  WBC 10.0  < > 9.2 11.1* 11.4* 12.9* 12.4*  NEUTROABS 8.6*  --   --   --   --  10.9*  --   HGB 13.8  < > 12.4* 12.3* 12.8* 12.1* 11.7*  HCT 41.3  < > 38.3* 37.6* 39.2 37.8* 36.1*  MCV 89.2  < > 89.1 90.4 90.1 90.2 89.8  PLT 181  < > 181 204 220 292 303  < > = values in this interval not displayed. Cardiac Enzymes: No results for input(s): CKTOTAL, CKMB, CKMBINDEX, TROPONINI in the last 168 hours. BNP (last 3 results) No results for input(s): PROBNP in the last 8760 hours. CBG:  Recent Labs Lab 02/09/15 0814 02/09/15 1243 02/09/15 1529 02/09/15 2301 02/10/15 0811  GLUCAP 158* 182* 137* 104* 127*    Recent Results (from the past 240 hour(s))  Blood Culture (routine  x 2)     Status: None (Preliminary result)   Collection Time: 02/05/15  6:45 PM  Result Value Ref Range Status   Specimen Description BLOOD LEFT ARM  Final   Special Requests BOTTLES DRAWN AEROBIC AND ANAEROBIC 5CC  Final   Culture   Final           BLOOD CULTURE RECEIVED NO GROWTH TO DATE CULTURE WILL BE HELD FOR 5 DAYS BEFORE ISSUING A FINAL NEGATIVE REPORT Performed at [facility]    Report Status PENDING  Incomplete  Blood Culture (routine x 2)     Status: None (Preliminary result)   Collection Time: 02/05/15  6:45 PM  Result Value Ref Range Status   Specimen Description BLOOD RIGHT FOREARM  Final   Special Requests BOTTLES DRAWN AEROBIC AND ANAEROBIC 5CC  Final   Culture   Final           BLOOD CULTURE RECEIVED NO GROWTH TO DATE CULTURE WILL BE HELD FOR 5 DAYS BEFORE ISSUING A FINAL NEGATIVE REPORT Performed at [facility]    Report Status PENDING  Incomplete  Urine culture     Status: None   Collection Time: 02/05/15  6:52 PM  Result Value Ref Range Status   Specimen Description URINE, RANDOM  Final   Special Requests NONE  Final   Colony Count    Final    15,000 COLONIES/ML Performed at [facility]    Culture   Final    DIPHTHEROIDS(CORYNEBACTERIUM SPECIES) Note: Standardized susceptibility testing for this organism is not available. Performed at [facility]    Report Status 02/07/2015 FINAL  Final  Respiratory virus panel     Status: None   Collection Time: 02/06/15 10:46 AM  Result Value Ref Range Status   Source - RVPAN NOSE  Corrected   Respiratory Syncytial Virus A Negative Negative Final   Respiratory Syncytial Virus B Negative Negative Final   Influenza A Negative Negative Final   Influenza B Negative Negative Final   Parainfluenza 1 Negative Negative Final   Parainfluenza 2 Negative Negative Final   Parainfluenza 3 Negative Negative Final   Metapneumovirus Negative Negative Final   Rhinovirus Negative Negative Final   Adenovirus Negative Negative Final    Comment: (NOTE) Performed At: [facility] [place], [place] [id number] Lindon Romp MD LG:[id number]   MRSA PCR Screening     Status: None   Collection Time: 02/06/15  5:02 PM  Result Value Ref Range Status   MRSA by PCR NEGATIVE NEGATIVE Final    Comment:        The GeneXpert MRSA Assay (FDA approved for NASAL specimens only), is one component of a comprehensive MRSA colonization surveillance program. It is not intended to diagnose MRSA infection nor to guide or monitor treatment for MRSA infections.   Culture, blood (routine x 2)     Status: None (Preliminary result)   Collection Time: 02/07/15  2:07 PM  Result Value Ref Range Status   Specimen Description BLOOD LEFT ARM  Final   Special Requests   Final    BOTTLES DRAWN AEROBIC AND ANAEROBIC 10CC BOTH BOTTLES   Culture   Final           BLOOD CULTURE RECEIVED NO GROWTH TO DATE CULTURE WILL BE HELD FOR 5 DAYS BEFORE ISSUING A FINAL NEGATIVE REPORT Performed at [facility]    Report Status PENDING  Incomplete  Urine culture  Status: None     Collection Time: 02/07/15  2:09 PM  Result Value Ref Range Status   Specimen Description URINE, RANDOM  Final   Special Requests NONE  Final   Colony Count NO GROWTH Performed at [facility]   Final   Culture NO GROWTH Performed at [facility]   Final   Report Status 02/08/2015 FINAL  Final  Culture, blood (routine x 2)     Status: None (Preliminary result)   Collection Time: 02/07/15  2:14 PM  Result Value Ref Range Status   Specimen Description BLOOD RIGHT ARM  Final   Special Requests   Final    BOTTLES DRAWN AEROBIC AND ANAEROBIC 10CC BOTH BOTTLES   Culture   Final           BLOOD CULTURE RECEIVED NO GROWTH TO DATE CULTURE WILL BE HELD FOR 5 DAYS BEFORE ISSUING A FINAL NEGATIVE REPORT Performed at [facility]    Report Status PENDING  Incomplete  Clostridium Difficile by PCR     Status: None   Collection Time: 02/07/15  4:10 PM  Result Value Ref Range Status   C difficile by pcr NEGATIVE NEGATIVE Final         Studies: Dg Chest Port 1 View  02/10/2015   CLINICAL DATA:  Respiratory failure, shortness of breath.  EXAM: PORTABLE CHEST - 1 VIEW  COMPARISON:  02/08/2015  FINDINGS: Enlarged cardiac silhouette. Central vascular congestion. Right lower lobe airspace opacity. Linear left lung base opacity, favor atelectasis. Mild increased interstitial markings. Small pleural effusions not excluded. No pneumothorax. Multilevel degenerative changes.  IMPRESSION: Right lung consolidation is most in keeping with pneumonia.  Central vascular congestion. Interstitial edema not excluded.   Electronically Signed   By: Carlos Levering M.D.   On: 02/10/2015 06:23   US Abdomen Limited Ruq  02/08/2015   CLINICAL DATA:  Initial encounter for elevated LFTs with hyperbilirubinemia  EXAM: US ABDOMEN LIMITED - RIGHT UPPER QUADRANT  COMPARISON:  CT scan 02/07/2015  FINDINGS: Gallbladder:  No gallstones or wall thickening visualized. No sonographic Murphy sign noted.   Common bile duct:  Diameter: Nondilated at 3 mm diameter.  Liver:  No focal lesion identified. Within normal limits in parenchymal echogenicity.  Other: Incidental imaging of the right kidney shows increased echogenicity. 14 mm hypoechoic structure near the upper pole is likely a cyst.  IMPRESSION: No evidence for biliary dilatation.  No gallstones.   Electronically Signed   By: Misty Stanley M.D.   On: 02/08/2015 15:24        Scheduled Meds: . amLODipine  10 mg Oral Daily  . clopidogrel  75 mg Oral Daily  . docusate sodium  100 mg Oral BID  . enoxaparin (LOVENOX) injection  0.5 mg/kg Subcutaneous Daily  . guaiFENesin  600 mg Oral BID  . insulin aspart  0-5 Units Subcutaneous QHS  . insulin aspart  0-9 Units Subcutaneous TID WC  . insulin glargine  10 Units Subcutaneous q morning - 10a  . ipratropium  0.5 mg Nebulization TID  . levalbuterol  0.63 mg Nebulization TID  . levofloxacin  500 mg Oral Daily  . metoprolol tartrate  50 mg Oral BID  . piperacillin-tazobactam  3.375 g Intravenous 3 times per day  . sodium chloride  3 mL Intravenous Q12H   Continuous Infusions: . sodium chloride 10 mL/hr at 02/08/15 0921  . sodium chloride      Active Problems:   HTN (hypertension)  Coronary atherosclerosis of native coronary artery   Diabetes mellitus type 2, uncontrolled   SIRS (systemic inflammatory response syndrome)   Acute on chronic renal failure   Chronic diastolic heart failure   Acute respiratory failure with hypoxia   Sepsis   Hyperbilirubinemia    Time spent: 30 minutes     Annahi Short, MD, FACP, FHM. [facility] Pager [phone]  If 7PM-7AM, please contact night-coverage [email or web address] Password [email or web address] 02/10/2015, 1:21 PM    LOS: 5 days

## 2015-02-11 DIAGNOSIS — I2510 Atherosclerotic heart disease of native coronary artery without angina pectoris: Secondary | ICD-10-CM

## 2015-02-11 DIAGNOSIS — I472 Ventricular tachycardia: Secondary | ICD-10-CM

## 2015-02-11 LAB — COMPREHENSIVE METABOLIC PANEL
ALT: 66 U/L — ABNORMAL HIGH (ref 0–53)
AST: 69 U/L — ABNORMAL HIGH (ref 0–37)
Albumin: 2.2 g/dL — ABNORMAL LOW (ref 3.5–5.2)
Alkaline Phosphatase: 224 U/L — ABNORMAL HIGH (ref 39–117)
Anion gap: 6 (ref 5–15)
BUN: 44 mg/dL — ABNORMAL HIGH (ref 6–23)
CO2: 26 mmol/L (ref 19–32)
Calcium: 8 mg/dL — ABNORMAL LOW (ref 8.4–10.5)
Chloride: 106 mmol/L (ref 96–112)
Creatinine, Ser: 2.07 mg/dL — ABNORMAL HIGH (ref 0.50–1.35)
GFR calc Af Amer: 37 mL/min — ABNORMAL LOW (ref >90)
GFR calc non Af Amer: 32 mL/min — ABNORMAL LOW (ref >90)
Glucose, Bld: 143 mg/dL — ABNORMAL HIGH (ref 70–99)
Potassium: 4.1 mmol/L (ref 3.5–5.1)
Sodium: 138 mmol/L (ref 135–145)
Total Bilirubin: 1.2 mg/dL (ref 0.3–1.2)
Total Protein: 6.4 g/dL (ref 6.0–8.3)

## 2015-02-11 LAB — MAGNESIUM: Magnesium: 2.5 mg/dL (ref 1.5–2.5)

## 2015-02-11 LAB — GLUCOSE, CAPILLARY
Glucose-Capillary: 153 mg/dL — ABNORMAL HIGH (ref 70–99)
Glucose-Capillary: 241 mg/dL — ABNORMAL HIGH (ref 70–99)
Glucose-Capillary: 148 mg/dL — ABNORMAL HIGH (ref 70–99)
Glucose-Capillary: 132 mg/dL — ABNORMAL HIGH (ref 70–99)

## 2015-02-11 LAB — CBC
HCT: 38.8 % — ABNORMAL LOW (ref 39.0–52.0)
Hemoglobin: 12.2 g/dL — ABNORMAL LOW (ref 13.0–17.0)
MCH: 28.4 pg (ref 26.0–34.0)
MCHC: 31.4 g/dL (ref 30.0–36.0)
MCV: 90.4 fL (ref 78.0–100.0)
Platelets: 356 10*3/uL (ref 150–400)
RBC: 4.29 MIL/uL (ref 4.22–5.81)
RDW: 15.2 % (ref 11.5–15.5)
WBC: 10.5 10*3/uL (ref 4.0–10.5)

## 2015-02-11 MED ORDER — METOPROLOL TARTRATE 100 MG PO TABS
100.0000 mg | ORAL_TABLET | Freq: Two times a day (BID) | ORAL | Status: DC
  Administered 2015-02-11 – 2015-02-13 (×4): 100 mg via ORAL
  Filled 2015-02-11 (×5): qty 1

## 2015-02-11 MED ORDER — SODIUM CHLORIDE 0.9 % IV SOLN
INTRAVENOUS | Status: DC
  Administered 2015-02-11: 16:00:00 via INTRAVENOUS

## 2015-02-11 NOTE — Consult Note (Signed)
Admit date: 02/05/2015 Referring Physician  Dr. Algis Liming Primary Physician Kandice Hams, MD Primary Cardiologist  Dr. 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  He has distal LAD disease medically managed and has not been experiencing any indigestion-like symptoms as he had with his prior myocardial infarction.  He was admitted with 1 week of progressive shortness of breath, coughing, lightheadedness with fever of up to 104F in the emergency department associated with tachycardia, increased work of breathing and hypoxemia. Diagnosis was pneumonia and he was started on vancomycin as well as cefepime. Influenza was negative. His shortness of breath became more progressive and on 02/06/15 he was transferred to the stepdown unit. Blood cultures have been negative.  On telemetry per report, he had an asymptomatic 37 beats of nonsustained ventricular tachycardia at 1:32 AM on 02/11/15. Personally viewed, this is ventricular tachycardia. This is not artifactual from possible tremor for instance.  He has not had any syncopal episodes at home. No unexplained chest pain episodes or shortness of breath at home.  Echo on this admission showed normal ejection fraction. Metoprolol was increased on this admission as well. Magnesium and potassium were normal.  PMH:   Past Medical History  Diagnosis Date  . Hypertension   . Hyperlipidemia   . Coronary artery disease   . Swelling of left lower extremity     LLE; "happens often"  . Myocardial infarction 05/2012    "mild"  . Type II diabetes mellitus   . Gout   . Arthritis   . Cancer     PSH:   Past Surgical History  Procedure Laterality Date  . Coronary angioplasty with stent placement  06/05/2012      "2; total of 2"  . Back surgery    . Lumbar disc surgery  1990's  . Robot assisted laparoscopic radical prostatectomy N/A 02/25/2014    Procedure: ROBOTIC ASSISTED LAPAROSCOPIC RADICAL  PROSTATECTOMY,  UMBILICAL HERNIA REPAIR;  Surgeon: Molli Hazard, MD;  Location: [facility] ORS;  Service: Urology;  Laterality: N/A;  . Lymphadenectomy Bilateral 02/25/2014    Procedure: [facility] LYMPH NODE DISSECTION";  Surgeon: Molli Hazard, MD;  Location: [facility] ORS;  Service: Urology;  Laterality: Bilateral;  . Prostate surgery    . Left heart catheterization with coronary angiogram N/A 06/05/2012    Procedure: LEFT HEART CATHETERIZATION WITH CORONARY ANGIOGRAM;  Surgeon: Jettie Booze, MD;  Location: [facility] CATH LAB;  Service: Cardiovascular;  Laterality: N/A;  possible PCI   Allergies:  Review of patient's allergies indicates no known allergies. Prior to Admit Meds:   Prior to Admission medications   Medication Sig Start Date End Date Taking? Authorizing Provider  allopurinol (ZYLOPRIM) 100 MG tablet Take 300 mg by mouth daily as needed (gout).    Yes Historical Provider, MD  amLODipine-valsartan (EXFORGE) 10-320 MG per tablet Take 1 tablet by mouth daily.   Yes Historical Provider, MD  aspirin-acetaminophen-caffeine (EXCEDRIN MIGRAINE) [phone] MG per tablet Take 1 tablet by mouth every 6 (six) hours as needed for headache or migraine (migraine).   Yes Historical Provider, MD  clopidogrel (PLAVIX) 75 MG tablet TAKE 1 TABLET (75 MG TOTAL) BY MOUTH DAILY. 10/22/14  Yes Jettie Booze, MD  insulin glargine (LANTUS) 100 UNIT/ML injection Inject 25 Units into the skin every morning.    Yes  Historical Provider, MD  metFORMIN (GLUCOPHAGE) 1000 MG tablet Take 1,000 mg by mouth 2 (two) times daily with a meal.   Yes Historical Provider, MD  metoprolol tartrate (LOPRESSOR) 25 MG tablet TAKE 1/2 TABLET BY MOUTH TWICE DAILY 11/12/14  Yes Jettie Booze, MD  naproxen sodium (ANAPROX)  220 MG tablet Take 220 mg by mouth daily as needed (pain).    Yes Historical Provider, MD  nitroGLYCERIN (NITROSTAT) 0.4 MG SL tablet Place 0.4 mg under the tongue every 5 (five) minutes as needed for chest pain.   Yes Historical Provider, MD   Fam HX:    Family History  Problem Relation Age of Onset  . Breast cancer Mother   . Asthma Father   . Parkinson's disease Father    Social HX:    History   Social History  . Marital Status: Married    Spouse Name: N/A  . Number of Children: N/A  . Years of Education: N/A   Occupational History  . Not on file.   Social History Main Topics  . Smoking status: Former Smoker -- 2.00 packs/day for 30 years    Types: Cigarettes    Quit date: 10/23/1993  . Smokeless tobacco: Never Used  . Alcohol Use: No  . Drug Use: No  . Sexual Activity: Not Currently   Other Topics Concern  . Not on file   Social History Narrative     ROS:  No bleeding, no syncope, no orthopnea, asymptomatic NSVT. All 11 ROS were addressed and are negative except what is stated in the HPI   Physical Exam: Blood pressure 142/80, pulse 83, temperature 98.4 F (36.9 C), temperature source Oral, resp. rate 18, height 5\' 7"  (1.702 m), weight 216 lb 12.8 oz (98.34 kg), SpO2 93 %.   General: Appears comfortable in bed, overweight Head: Eyes PERRLA, No xanthomas.   Normal cephalic and atramatic  Lungs:  Mild crackles heard right fields.. Heart:   HRRR S1 S2 Pulses are 2+ & equal. No murmur, rubs, gallops.  No carotid bruit. No JVD.  No abdominal bruits.  Abdomen: Bowel sounds are positive, abdomen soft and non-tender without masses. No hepatosplenomegaly. Overweight Msk:  Back normal. Normal strength and tone for age. Extremities:  No clubbing, cyanosis or edema.  DP +1 Neuro: Alert and oriented X 3, non-focal, MAE x 4 GU: Deferred Rectal: Deferred Psych:  Good affect, responds appropriately    ECHO: 02/07/15:  - Normal LV function; moderate LVH; grade 1 diastolic  dysfunction; trace MR and TR.  Labs: Lab Results  Component Value Date   WBC 10.5 02/11/2015   HGB 12.2* 02/11/2015   HCT 38.8* 02/11/2015   MCV 90.4 02/11/2015   PLT 356 02/11/2015     Recent Labs Lab 02/11/15 0515  NA 138  K 4.1  CL 106  CO2 26  BUN 44*  CREATININE 2.07*  CALCIUM 8.0*  PROT 6.4  BILITOT 1.2  ALKPHOS 224*  ALT 66*  AST 69*  GLUCOSE 143*     Radiology:  Dg Chest Port 1 View  02/10/2015   CLINICAL DATA:  Respiratory failure, shortness of breath.  EXAM: PORTABLE CHEST - 1 VIEW  COMPARISON:  02/08/2015  FINDINGS: Enlarged cardiac silhouette. Central vascular congestion. Right lower lobe airspace opacity. Linear left lung base opacity, favor atelectasis. Mild increased interstitial markings. Small pleural effusions not excluded. No pneumothorax. Multilevel degenerative changes.  IMPRESSION: Right lung consolidation is most in keeping with pneumonia.  Central vascular congestion. Interstitial edema  not excluded.   Electronically Signed   By: Carlos Levering M.D.   On: 02/10/2015 06:23   Personally viewed.  EKG:  02/05/15- Sinus tachycardia rate 128 with nonspecific ST-T wave changes, poor R-wave progression Personally viewed.   Scheduled Meds: . amLODipine  10 mg Oral Daily  . clopidogrel  75 mg Oral Daily  . docusate sodium  100 mg Oral BID  . enoxaparin (LOVENOX) injection  0.5 mg/kg Subcutaneous Daily  . guaiFENesin  600 mg Oral BID  . insulin aspart  0-5 Units Subcutaneous QHS  . insulin aspart  0-9 Units Subcutaneous TID WC  . insulin glargine  10 Units Subcutaneous q morning - 10a  . ipratropium  0.5 mg Nebulization TID  . levalbuterol  0.63 mg Nebulization TID  . levofloxacin  500 mg Oral Daily  . metoprolol tartrate  100 mg Oral BID  . sodium chloride  3 mL Intravenous Q12H   Continuous Infusions: . sodium chloride 75 mL/hr at 02/11/15 1547   PRN Meds:.acetaminophen **OR** acetaminophen, guaiFENesin-dextromethorphan,  HYDROcodone-acetaminophen, labetalol, levalbuterol, ondansetron **OR** ondansetron (ZOFRAN) IV  ASSESSMENT/PLAN:    64 year old male with coronary artery disease, overlapping DES to LAD 05/2012 with normal ejection fraction, moderate LVH, right lower lobe pneumonia, fevers mildly elevated liver enzymes/ALT with 37 beats of nonsustained ventricular tachycardia/wide-complex tachycardia seen on telemetry at 1:32 AM on 02/11/15.  1. Nonsustained ventricular tachycardia  - Does not have any underlying anginal symptoms (typical angina in the past was indigestion-like symptom for him)  - Nuclear stress test 09/29/13 after stent placement was reassuring.  - Echocardiogram on this admission showed normal ejection fraction with moderate LVH.  - I agree with increasing metoprolol in fact I will go ahead and increase once again assess pulse should be able to tolerate quite well. We will move his metoprolol up from 50 mg twice a day to 100 mg twice a day.  - If hypotension occurs, I would decrease other antihypertensive such as amlodipine or angiotensin receptor blocker.  - No further cardiac testing at this point.  - Agree with potassium and magnesium greater than 4/2.  - Asymptomatic. At home, no high-risk symptoms such as unexplained syncope.  - Try to avoid Zofran if possible.  2. Coronary artery disease  - Overlapping LAD stents placed in 2013  - Okay continuing with Plavix  3. Essential hypertension  - Continue with current medications. Increasing metoprolol.  4. Right lower lobe pneumonia-per primary team, antibiotics  We will follow along with you.  Candee Furbish, MD  02/11/2015  4:29 PM

## 2015-02-11 NOTE — Progress Notes (Addendum)
PROGRESS NOTE    GEORGE HAGGART ASN:[id number] DOB: 1950-12-14 DOA: 02/05/2015 PCP: Kandice Hams, MD  HPI/Brief narrative 64 year old male with a history of LP3/XTKW with renal complications, prostate cancer, hypertension, coronary artery disease, gouty arthritis presented with one-week history of progressive shortness of breath, coughing, and lightheadedness. He had a fever of 104.86F in the emergency department with associated tachycardia, tachypnea and hypoxemia. He was admitted for sepsis workup. Working diagnosis was pneumonia, and the patient was started on vancomycin and cefepime. Influenza PCR was negative. On 02/06/2015, the patient developed progressive dyspnea and was transferred to the stepdown unit. On 02/10/15, patient noted to be stable, no fevers since 4/18, cultures thus far negative. Patient will be transferred to telemetry.   Assessment/Plan:  Acute hypoxemic respiratory failure - likely due community-acquired right lower lobe pneumonia and less likely from pulmonary edema - Treated empirically with broad-spectrum IV antibiotics including IV vancomycin, IV cefepime (changed to Zosyn) and subsequently levofloxacin added - Blood cultures 4: Negative to date  - flu panel PCR and RSV panel negative - 02/06/2015 chest x-ray showed increasing interstitial thickening > treated with 2 doses of Lasix - Chek procalcitonin 3.14-->4.18 > 3.76 - 02/10/15 repeat CXR shows right lower lobe consolidation  - Discontinued vancomycin 4/20 and Zosyn 4/21. Continue levofloxacin by mouth and complete total 7 days treatment.  Community-acquired right lower lobe pneumonia - Antibiotic management as indicated above - Will need follow-up chest x-ray in 4-6 weeks to ensure resolution of pneumonia findings - Blood cultures 4: Negative to date - Urine legionella antigen negative  Sepsis, present on admission  -due to pneumonia -Repeat lactic acid 0.8  -check procalcitonin--3.14-->4.18  >3.76  - Extensive workup done and unremarkable except for pneumonia  - Blood cultures 4: Negative to date  - Urine culture: Negative  - Flu panel PCR: Negative  - RSV panel: Negative - C. difficile PCR: Negative  - CT abdomen and pelvis without contrast: No acute findings except for right lower lobe pneumonia   Transaminasemia/Hyperbilirubinemia -suspect due to sepsis and cholestasis -RUQ ultrasound--neg - Denies GI symptoms.  - Stable without significant worsening or improvement. Follow LFTs  - ? Related to antibiotics - Check acute hepatitis panel.  Acute on chronic renal failure (CKD 2-3) - Secondary to sepsis, ARB at home & ? NSAID's at home  - pt initially felt to have pulmonary edema-->given lasix 40mg  IV x 2 (4/16 & 4/17)-->worsen renal function - Creatinine in November 2015: 1.24. Admitting creatinine 1.89. Baseline creatinine may be in the 1.4-1.7 range - Acute renal failure possibly from Lasix complicating underlying chronic kidney disease - CT abdomen without contrast did not show hydronephrosis - Hold nephrotoxic. - Brief gentle IV fluids and follow BMP closely - Creatinine finally starting to improve.  Diabetes mellitus type 2  -Decreased to Lantus 10 units  -NovoLog sliding scale  - Good inpatient control   Coronary artery disease  -Continue metoprolol tartrate  -Continue Plavix - 2-D echo 02/07/15: LVEF 55-60 percent and grade 1 diastolic dysfunction.  Hypertension  -Continue metoprolol tartrate--increase to 50mg  bid  - Added amlodipine  - Mildly uncontrolled  Tremor -resting tremor -pt can suppress on command -suspect a component of shivering and rigors as well as worsening due to acute medical ilnness - Seems to have resolved.  LUE edema - venous duplex--neg  NSVT - Patient had asymptomatic 37 beat NSVT on monitor on 02/11/15 at 1:32 AM - Echo this admission with normal EF - Metoprolol increased this admission -  Magnesium and potassium  normal. - Cardiology consulted   Code Status: Full Family Communication: None at bedside Disposition Plan: Transfer to Tele. DC home possibly in 1-2 days.   Consultants:  None  Procedures:  None  Antibiotics:  IV Cefepime 4/15> 4/16   Levofloxacin oral 4/18 >  Tamiflu 4/15 > 4/16   IV Zosyn 4/17 > 4/21  IV vancomycin 4/15 > 4/20    Subjective: Overall states that he continues to feel better. Denied cough, dyspnea, chest pain or palpitations.  Objective: Filed Vitals:   02/10/15 1949 02/10/15 2215 02/11/15 0500 02/11/15 0727  BP:  153/81 158/90   Pulse:  85 79   Temp:  98.4 F (36.9 C) 97.7 F (36.5 C)   TempSrc:  Oral Oral   Resp:  18 18   Height:      Weight:   98.34 kg (216 lb 12.8 oz)   SpO2: 91% 93% 94% 93%    Intake/Output Summary (Last 24 hours) at 02/11/15 1521 Last data filed at 02/11/15 1215  Gross per 24 hour  Intake    440 ml  Output   1050 ml  Net   -610 ml   Filed Weights   02/09/15 0500 02/10/15 0451 02/11/15 0500  Weight: 98.5 kg (217 lb 2.5 oz) 98.7 kg (217 lb 9.5 oz) 98.34 kg (216 lb 12.8 oz)     Exam:  General exam: Pleasant middle-aged male lying comfortably propped up in bed Respiratory system:  right basal bronchial breath sounds with occasional crackles. Rest of lung fields clear to auscultation . No increased work of breathing. Cardiovascular system: S1 & S2 heard, RRR. No JVD, murmurs, gallops, clicks or pedal edema. telemetry: Sinus rhythm. 37 beat NSVT on 4/21 at 1:32 AM. Gastrointestinal system: Abdomen is nondistended, soft and nontender. Normal bowel sounds heard. Central nervous system: Alert and oriented. No focal neurological deficits. Extremities: Symmetric 5 x 5 power.   Data Reviewed: Basic Metabolic Panel:  Recent Labs Lab 02/06/15 0533  02/07/15 0932 02/08/15 0412 02/09/15 0840 02/10/15 0329 02/11/15 0515  NA 136  < > 135 135 135 138 138  K 4.0  < > 4.1 4.3 4.2 4.0 4.1  CL 104  < > 102 99 98 101  106  CO2 23  < > 25 24 25 25 26   GLUCOSE 163*  < > 248* 139* 155* 117* 143*  BUN 25*  < > 27* 29* 40* 45* 44*  CREATININE 1.62*  < > 1.72* 1.99* 2.39* 2.32* 2.07*  CALCIUM 7.7*  < > 7.6* 7.8* 8.0* 8.2* 8.0*  MG 2.3  --   --   --   --   --  2.5  PHOS 3.7  --   --   --   --   --   --   < > = values in this interval not displayed. Liver Function Tests:  Recent Labs Lab 02/06/15 1627 02/07/15 0932 02/08/15 0412 02/09/15 0840 02/11/15 0515  AST 45* 53* 76* 78* 69*  ALT 35 42 56* 65* 66*  ALKPHOS 108 132* 180* 196* 224*  BILITOT 1.4* 1.3* 1.9* 2.3* 1.2  PROT 6.6 6.6 6.9 7.3 6.4  ALBUMIN 2.9* 2.8* 2.8* 2.7* 2.2*   No results for input(s): LIPASE, AMYLASE in the last 168 hours. No results for input(s): AMMONIA in the last 168 hours. CBC:  Recent Labs Lab 02/05/15 1845  02/07/15 0932 02/08/15 0412 02/09/15 0840 02/10/15 0329 02/11/15 0515  WBC 10.0  < > 11.1* 11.4*  12.9* 12.4* 10.5  NEUTROABS 8.6*  --   --   --  10.9*  --   --   HGB 13.8  < > 12.3* 12.8* 12.1* 11.7* 12.2*  HCT 41.3  < > 37.6* 39.2 37.8* 36.1* 38.8*  MCV 89.2  < > 90.4 90.1 90.2 89.8 90.4  PLT 181  < > 204 220 292 303 356  < > = values in this interval not displayed. Cardiac Enzymes: No results for input(s): CKTOTAL, CKMB, CKMBINDEX, TROPONINI in the last 168 hours. BNP (last 3 results) No results for input(s): PROBNP in the last 8760 hours. CBG:  Recent Labs Lab 02/10/15 1235 02/10/15 1615 02/10/15 2110 02/11/15 0732 02/11/15 1203  GLUCAP 230* 196* 179* 153* 241*    Recent Results (from the past 240 hour(s))  Blood Culture (routine x 2)     Status: None (Preliminary result)   Collection Time: 02/05/15  6:45 PM  Result Value Ref Range Status   Specimen Description BLOOD LEFT ARM  Final   Special Requests BOTTLES DRAWN AEROBIC AND ANAEROBIC 5CC  Final   Culture   Final           BLOOD CULTURE RECEIVED NO GROWTH TO DATE CULTURE WILL BE HELD FOR 5 DAYS BEFORE ISSUING A FINAL NEGATIVE  REPORT Performed at [facility]    Report Status PENDING  Incomplete  Blood Culture (routine x 2)     Status: None (Preliminary result)   Collection Time: 02/05/15  6:45 PM  Result Value Ref Range Status   Specimen Description BLOOD RIGHT FOREARM  Final   Special Requests BOTTLES DRAWN AEROBIC AND ANAEROBIC 5CC  Final   Culture   Final           BLOOD CULTURE RECEIVED NO GROWTH TO DATE CULTURE WILL BE HELD FOR 5 DAYS BEFORE ISSUING A FINAL NEGATIVE REPORT Performed at [facility]    Report Status PENDING  Incomplete  Urine culture     Status: None   Collection Time: 02/05/15  6:52 PM  Result Value Ref Range Status   Specimen Description URINE, RANDOM  Final   Special Requests NONE  Final   Colony Count   Final    15,000 COLONIES/ML Performed at [facility]    Culture   Final    DIPHTHEROIDS(CORYNEBACTERIUM SPECIES) Note: Standardized susceptibility testing for this organism is not available. Performed at [facility]    Report Status 02/07/2015 FINAL  Final  Respiratory virus panel     Status: None   Collection Time: 02/06/15 10:46 AM  Result Value Ref Range Status   Source - RVPAN NOSE  Corrected   Respiratory Syncytial Virus A Negative Negative Final   Respiratory Syncytial Virus B Negative Negative Final   Influenza A Negative Negative Final   Influenza B Negative Negative Final   Parainfluenza 1 Negative Negative Final   Parainfluenza 2 Negative Negative Final   Parainfluenza 3 Negative Negative Final   Metapneumovirus Negative Negative Final   Rhinovirus Negative Negative Final   Adenovirus Negative Negative Final    Comment: (NOTE) Performed At: [facility] [address] [place], [place] [id number] Lindon Romp MD [id number]   MRSA PCR Screening     Status: None   Collection Time: 02/06/15  5:02 PM  Result Value Ref Range Status   MRSA by PCR NEGATIVE NEGATIVE Final    Comment:        The GeneXpert MRSA  Assay (FDA approved for  NASAL specimens only), is one component of a comprehensive MRSA colonization surveillance program. It is not intended to diagnose MRSA infection nor to guide or monitor treatment for MRSA infections.   Culture, blood (routine x 2)     Status: None (Preliminary result)   Collection Time: 02/07/15  2:07 PM  Result Value Ref Range Status   Specimen Description BLOOD LEFT ARM  Final   Special Requests   Final    BOTTLES DRAWN AEROBIC AND ANAEROBIC 10CC BOTH BOTTLES   Culture   Final           BLOOD CULTURE RECEIVED NO GROWTH TO DATE CULTURE WILL BE HELD FOR 5 DAYS BEFORE ISSUING A FINAL NEGATIVE REPORT Performed at [facility]    Report Status PENDING  Incomplete  Urine culture     Status: None   Collection Time: 02/07/15  2:09 PM  Result Value Ref Range Status   Specimen Description URINE, RANDOM  Final   Special Requests NONE  Final   Colony Count NO GROWTH Performed at [facility]   Final   Culture NO GROWTH Performed at [facility]   Final   Report Status 02/08/2015 FINAL  Final  Culture, blood (routine x 2)     Status: None (Preliminary result)   Collection Time: 02/07/15  2:14 PM  Result Value Ref Range Status   Specimen Description BLOOD RIGHT ARM  Final   Special Requests   Final    BOTTLES DRAWN AEROBIC AND ANAEROBIC 10CC BOTH BOTTLES   Culture   Final           BLOOD CULTURE RECEIVED NO GROWTH TO DATE CULTURE WILL BE HELD FOR 5 DAYS BEFORE ISSUING A FINAL NEGATIVE REPORT Performed at [facility]    Report Status PENDING  Incomplete  Clostridium Difficile by PCR     Status: None   Collection Time: 02/07/15  4:10 PM  Result Value Ref Range Status   C difficile by pcr NEGATIVE NEGATIVE Final         Studies: Dg Chest Port 1 View  02/10/2015   CLINICAL DATA:  Respiratory failure, shortness of breath.  EXAM: PORTABLE CHEST - 1 VIEW  COMPARISON:  02/08/2015  FINDINGS: Enlarged cardiac silhouette.  Central vascular congestion. Right lower lobe airspace opacity. Linear left lung base opacity, favor atelectasis. Mild increased interstitial markings. Small pleural effusions not excluded. No pneumothorax. Multilevel degenerative changes.  IMPRESSION: Right lung consolidation is most in keeping with pneumonia.  Central vascular congestion. Interstitial edema not excluded.   Electronically Signed   By: Carlos Levering M.D.   On: 02/10/2015 06:23        Scheduled Meds: . amLODipine  10 mg Oral Daily  . clopidogrel  75 mg Oral Daily  . docusate sodium  100 mg Oral BID  . enoxaparin (LOVENOX) injection  0.5 mg/kg Subcutaneous Daily  . guaiFENesin  600 mg Oral BID  . insulin aspart  0-5 Units Subcutaneous QHS  . insulin aspart  0-9 Units Subcutaneous TID WC  . insulin glargine  10 Units Subcutaneous q morning - 10a  . ipratropium  0.5 mg Nebulization TID  . levalbuterol  0.63 mg Nebulization TID  . levofloxacin  500 mg Oral Daily  . metoprolol tartrate  50 mg Oral BID  . sodium chloride  3 mL Intravenous Q12H   Continuous Infusions:    Active Problems:   HTN (hypertension)   Coronary atherosclerosis of native coronary artery  Diabetes mellitus type 2, uncontrolled   SIRS (systemic inflammatory response syndrome)   Acute on chronic renal failure   Chronic diastolic heart failure   Acute respiratory failure with hypoxia   Sepsis   Hyperbilirubinemia    Time spent: 30 minutes     Tarahji Ramthun, MD, FACP, FHM. [facility] Pager [phone]  If 7PM-7AM, please contact night-coverage [email or web address] Password [email or web address] 02/11/2015, 3:21 PM    LOS: 6 days

## 2015-02-11 NOTE — Evaluation (Addendum)
Physical Therapy Evaluation Patient Details Name: IRVINE GLORIOSO MRN: [id number] DOB: Feb 26, 1951 Today's Date: 02/11/2015   History of Present Illness  pstient admitted  02/05/15 with increased SOB, temp, tachycardia, hypoxia.   Clinical Impression  Patient coming out of bathroom,  Assisted to ambulate in hall without oxygen. Stas 85%, HR 107, sats returned to 90 quickly after rest. Replaced 2 liters. Patient will benefit from PT to address problems listed in note below.    Follow Up Recommendations No PT follow up    Equipment Recommendations  None recommended by PT    Recommendations for Other Services       Precautions / Restrictions Precautions Precautions: Fall Precaution Comments: monitor sats      Mobility  Bed Mobility                  Transfers                    Ambulation/Gait Ambulation/Gait assistance: Supervision Ambulation Distance (Feet): 200 Feet (with IV pole, then 100' with no support)   Gait Pattern/deviations: WFL(Within Functional Limits)     General Gait Details: no balance loss noted.,noted 2/4 dyspnea on /ra  Stairs            Wheelchair Mobility    Modified Rankin (Stroke Patients Only)       Balance           Standing balance support: During functional activity Standing balance-Leahy Scale: Normal                               Pertinent Vitals/Pain Pain Assessment: No/denies pain    Home Living Family/patient expects to be discharged to:: Private residence Living Arrangements: Alone Available Help at Discharge: Family;Friend(s) Type of Home: House Home Access: Stairs to enter Entrance Stairs-Rails: Psychiatric nurse of Steps: 8 Home Layout: One level Home Equipment: None Additional Comments: wife in SNF currently    Prior Function Level of Independence: Independent               Hand Dominance        Extremity/Trunk Assessment   Upper Extremity  Assessment: Overall WFL for tasks assessed           Lower Extremity Assessment: LLE deficits/detail   LLE Deficits / Details: old injury to Achilles     Communication   Communication: No difficulties  Cognition Arousal/Alertness: Awake/alert Behavior During Therapy: WFL for tasks assessed/performed Overall Cognitive Status: Within Functional Limits for tasks assessed                      General Comments      Exercises        Assessment/Plan    PT Assessment Patient needs continued PT services  PT Diagnosis Difficulty walking   PT Problem List Decreased mobility;Cardiopulmonary status limiting activity  PT Treatment Interventions Gait training;Stair training   PT Goals (Current goals can be found in the Care Plan section) Acute Rehab PT Goals Patient Stated Goal: too walk more, go home PT Goal Formulation: With patient Time For Goal Achievement: 02/25/15 Potential to Achieve Goals: Good    Frequency Min 3X/week   Barriers to discharge        Co-evaluation               End of Session   Activity Tolerance: Patient tolerated treatment well Patient left: in chair;with  call bell/phone within reach Nurse Communication: Mobility status         Time: 7356-7014 PT Time Calculation (min) (ACUTE ONLY): 15 min   Charges:   PT Evaluation $Initial PT Evaluation Tier I: 1 Procedure     PT G CodesClaretha Cooper 02/11/2015, 1:46 PM Tresa Endo PT [phone]

## 2015-02-12 DIAGNOSIS — I5032 Chronic diastolic (congestive) heart failure: Secondary | ICD-10-CM

## 2015-02-12 LAB — HEPATITIS PANEL, ACUTE
HCV Ab: NEGATIVE
Hep A IgM: NONREACTIVE
Hep B C IgM: NONREACTIVE
Hepatitis B Surface Ag: NEGATIVE

## 2015-02-12 LAB — GLUCOSE, CAPILLARY
Glucose-Capillary: 146 mg/dL — ABNORMAL HIGH (ref 70–99)
Glucose-Capillary: 170 mg/dL — ABNORMAL HIGH (ref 70–99)
Glucose-Capillary: 149 mg/dL — ABNORMAL HIGH (ref 70–99)
Glucose-Capillary: 160 mg/dL — ABNORMAL HIGH (ref 70–99)

## 2015-02-12 LAB — COMPREHENSIVE METABOLIC PANEL
ALT: 69 U/L — ABNORMAL HIGH (ref 0–53)
AST: 73 U/L — ABNORMAL HIGH (ref 0–37)
Albumin: 2.3 g/dL — ABNORMAL LOW (ref 3.5–5.2)
Alkaline Phosphatase: 246 U/L — ABNORMAL HIGH (ref 39–117)
Anion gap: 8 (ref 5–15)
BUN: 41 mg/dL — ABNORMAL HIGH (ref 6–23)
CO2: 25 mmol/L (ref 19–32)
Calcium: 8 mg/dL — ABNORMAL LOW (ref 8.4–10.5)
Chloride: 105 mmol/L (ref 96–112)
Creatinine, Ser: 1.8 mg/dL — ABNORMAL HIGH (ref 0.50–1.35)
GFR calc Af Amer: 44 mL/min — ABNORMAL LOW (ref >90)
GFR calc non Af Amer: 38 mL/min — ABNORMAL LOW (ref >90)
Glucose, Bld: 193 mg/dL — ABNORMAL HIGH (ref 70–99)
Potassium: 3.8 mmol/L (ref 3.5–5.1)
Sodium: 138 mmol/L (ref 135–145)
Total Bilirubin: 1 mg/dL (ref 0.3–1.2)
Total Protein: 6.3 g/dL (ref 6.0–8.3)

## 2015-02-12 LAB — CULTURE, BLOOD (ROUTINE X 2)
Culture: NO GROWTH
Culture: NO GROWTH

## 2015-02-12 MED ORDER — POTASSIUM CHLORIDE CRYS ER 20 MEQ PO TBCR
20.0000 meq | EXTENDED_RELEASE_TABLET | Freq: Once | ORAL | Status: AC
  Administered 2015-02-12: 20 meq via ORAL
  Filled 2015-02-12: qty 1

## 2015-02-12 NOTE — Progress Notes (Addendum)
[facility] PROGRESS NOTE Interim History: 64 year old male with a history of KP5/WSFK with renal complications, prostate cancer, hypertension, coronary artery disease, gouty arthritis presented with one-week history of progressive shortness of breath, coughing, and lightheadedness. He had a fever of 104.71F in the emergency department with associated tachycardia, tachypnea and hypoxemia. He was admitted for sepsis workup. Working diagnosis was pneumonia, and the patient was started on vancomycin and cefepime. Influenza PCR was negative. On 02/06/2015, the patient developed progressive dyspnea and was transferred to the stepdown unit. On 02/10/15, patient noted to be stable, no fevers since 4/18, cultures thus far negative. Patient will be transferred to telemetry.   Assessment/Plan: Acute respiratory failure with hypoxia due to community-acquired pneumonia/sepsis: - Started on empiric vancomycin and cefepime on admission and subsequently Descalated to Scissors. - Blood cultures negative 4. Influenza PCR and RSV panel negative. - Repeat a chest x-ray of 02/10/2015 showed a right lower lobe pneumonia. - Lactic acid has been less than 2 since admission., Procalcitonin has improved since admission. - C. difficile negative. - Ct of the abdomen pelvis show no acute findings except for right lower lobe pneumonia.  Transaminitis/hyperbilirubinemia: - Due to sepsis, abdominal ultrasound showed no acute findings. - Acute hepatitis panel negative.  Controlled diabetes mellitus type 2 - Well-controlled Lantus and sliding scale insulin continue current management.  Acute on chronic kidney disease stage III: - Baseline creatinine 1.4-1.7. - Acute injury multifactorial due to sepsis ARB and NSAID use. Continue to hold nephrotoxic drugs. - CT abdomen and pelvis show no hydronephrosis, creatinine improving with hydration.  Coronary artery disease: - Continue metoprolol and Plavix. 2-D echo  showed an EF of 55% with grade 1 diastolic heart failure.  Resting Tremor: - Likely due to acute medical's illness no resolved.  Lower extremity edema: - Venous Doppler negative for DVT.  Nonsustained atrial tachycardia: - Patient asymptomatic cardiology consulted recommended echocardiogram. Potassium and magnesium within normal limits. - Cardiology recommended to continue to titrate metoprolol while monitoring heart rate. - keep potassium greater than 4 magnesium greater than 2.   Code Status: Full Family Communication: None at bedside Disposition Plan: Transfer to Tele. DC home possibly in  24 hrs.   Consultants:  None  Procedures:  None  Antibiotics:  IV Cefepime 4/15> 4/16   Levofloxacin oral 4/18 >  Tamiflu 4/15 > 4/16   IV Zosyn 4/17 > 4/21  IV vancomycin 4/15 > 4/20  HPI/Subjective: He has no complaints was ago home.  Objective: Filed Vitals:   02/11/15 1534 02/11/15 2228 02/12/15 0450 02/12/15 0801  BP: 142/80 147/80 155/92   Pulse: 83 75    Temp: 98.4 F (36.9 C) 97.8 F (36.6 C)    TempSrc: Oral Oral Oral   Resp: 18 18 18    Height:      Weight:      SpO2: 93% 96% 92% 87%    Intake/Output Summary (Last 24 hours) at 02/12/15 0843 Last data filed at 02/12/15 0500  Gross per 24 hour  Intake 1471.25 ml  Output    200 ml  Net 1271.25 ml   Filed Weights   02/09/15 0500 02/10/15 0451 02/11/15 0500  Weight: 98.5 kg (217 lb 2.5 oz) 98.7 kg (217 lb 9.5 oz) 98.34 kg (216 lb 12.8 oz)    Exam:  General: Alert, awake, oriented x3, in no acute distress.  HEENT: No bruits, no goiter.  Heart: Regular rate and rhythm. Lungs: Good air movement, clear Abdomen: Soft, nontender, nondistended, positive bowel sounds.  Neuro: Grossly intact, nonfocal.   Data Reviewed: Basic Metabolic Panel:  Recent Labs Lab 02/06/15 0533  02/08/15 0412 02/09/15 0840 02/10/15 0329 02/11/15 0515 02/12/15 0527  NA 136  < > 135 135 138 138 138  K 4.0  < > 4.3  4.2 4.0 4.1 3.8  CL 104  < > 99 98 101 106 105  CO2 23  < > 24 25 25 26 25   GLUCOSE 163*  < > 139* 155* 117* 143* 193*  BUN 25*  < > 29* 40* 45* 44* 41*  CREATININE 1.62*  < > 1.99* 2.39* 2.32* 2.07* 1.80*  CALCIUM 7.7*  < > 7.8* 8.0* 8.2* 8.0* 8.0*  MG 2.3  --   --   --   --  2.5  --   PHOS 3.7  --   --   --   --   --   --   < > = values in this interval not displayed. Liver Function Tests:  Recent Labs Lab 02/07/15 0932 02/08/15 0412 02/09/15 0840 02/11/15 0515 02/12/15 0527  AST 53* 76* 78* 69* 73*  ALT 42 56* 65* 66* 69*  ALKPHOS 132* 180* 196* 224* 246*  BILITOT 1.3* 1.9* 2.3* 1.2 1.0  PROT 6.6 6.9 7.3 6.4 6.3  ALBUMIN 2.8* 2.8* 2.7* 2.2* 2.3*   No results for input(s): LIPASE, AMYLASE in the last 168 hours. No results for input(s): AMMONIA in the last 168 hours. CBC:  Recent Labs Lab 02/05/15 1845  02/07/15 0932 02/08/15 0412 02/09/15 0840 02/10/15 0329 02/11/15 0515  WBC 10.0  < > 11.1* 11.4* 12.9* 12.4* 10.5  NEUTROABS 8.6*  --   --   --  10.9*  --   --   HGB 13.8  < > 12.3* 12.8* 12.1* 11.7* 12.2*  HCT 41.3  < > 37.6* 39.2 37.8* 36.1* 38.8*  MCV 89.2  < > 90.4 90.1 90.2 89.8 90.4  PLT 181  < > 204 220 292 303 356  < > = values in this interval not displayed. Cardiac Enzymes: No results for input(s): CKTOTAL, CKMB, CKMBINDEX, TROPONINI in the last 168 hours. BNP (last 3 results)  Recent Labs  02/06/15 0926  BNP 91.2    ProBNP (last 3 results) No results for input(s): PROBNP in the last 8760 hours.  CBG:  Recent Labs Lab 02/11/15 0732 02/11/15 1203 02/11/15 1702 02/11/15 2220 02/12/15 0733  GLUCAP 153* 241* 148* 132* 146*    Recent Results (from the past 240 hour(s))  Blood Culture (routine x 2)     Status: None   Collection Time: 02/05/15  6:45 PM  Result Value Ref Range Status   Specimen Description BLOOD LEFT ARM  Final   Special Requests BOTTLES DRAWN AEROBIC AND ANAEROBIC 5CC  Final   Culture   Final    NO GROWTH 5  DAYS Performed at [facility]    Report Status 02/12/2015 FINAL  Final  Blood Culture (routine x 2)     Status: None   Collection Time: 02/05/15  6:45 PM  Result Value Ref Range Status   Specimen Description BLOOD RIGHT FOREARM  Final   Special Requests BOTTLES DRAWN AEROBIC AND ANAEROBIC 5CC  Final   Culture   Final    NO GROWTH 5 DAYS Performed at [facility]    Report Status 02/12/2015 FINAL  Final  Urine culture     Status: None   Collection Time: 02/05/15  6:52 PM  Result Value Ref Range  Status   Specimen Description URINE, RANDOM  Final   Special Requests NONE  Final   Colony Count   Final    15,000 COLONIES/ML Performed at [facility]    Culture   Final    DIPHTHEROIDS(CORYNEBACTERIUM SPECIES) Note: Standardized susceptibility testing for this organism is not available. Performed at [facility]    Report Status 02/07/2015 FINAL  Final  Respiratory virus panel     Status: None   Collection Time: 02/06/15 10:46 AM  Result Value Ref Range Status   Source - RVPAN NOSE  Corrected   Respiratory Syncytial Virus A Negative Negative Final   Respiratory Syncytial Virus B Negative Negative Final   Influenza A Negative Negative Final   Influenza B Negative Negative Final   Parainfluenza 1 Negative Negative Final   Parainfluenza 2 Negative Negative Final   Parainfluenza 3 Negative Negative Final   Metapneumovirus Negative Negative Final   Rhinovirus Negative Negative Final   Adenovirus Negative Negative Final    Comment: (NOTE) Performed At: [facility] [place], [place] [id number] Lindon Romp MD OF:[id number]   MRSA PCR Screening     Status: None   Collection Time: 02/06/15  5:02 PM  Result Value Ref Range Status   MRSA by PCR NEGATIVE NEGATIVE Final    Comment:        The GeneXpert MRSA Assay (FDA approved for NASAL specimens only), is one component of a comprehensive MRSA colonization surveillance  program. It is not intended to diagnose MRSA infection nor to guide or monitor treatment for MRSA infections.   Culture, blood (routine x 2)     Status: None (Preliminary result)   Collection Time: 02/07/15  2:07 PM  Result Value Ref Range Status   Specimen Description BLOOD LEFT ARM  Final   Special Requests   Final    BOTTLES DRAWN AEROBIC AND ANAEROBIC 10CC BOTH BOTTLES   Culture   Final           BLOOD CULTURE RECEIVED NO GROWTH TO DATE CULTURE WILL BE HELD FOR 5 DAYS BEFORE ISSUING A FINAL NEGATIVE REPORT Performed at [facility]    Report Status PENDING  Incomplete  Urine culture     Status: None   Collection Time: 02/07/15  2:09 PM  Result Value Ref Range Status   Specimen Description URINE, RANDOM  Final   Special Requests NONE  Final   Colony Count NO GROWTH Performed at [facility]   Final   Culture NO GROWTH Performed at [facility]   Final   Report Status 02/08/2015 FINAL  Final  Culture, blood (routine x 2)     Status: None (Preliminary result)   Collection Time: 02/07/15  2:14 PM  Result Value Ref Range Status   Specimen Description BLOOD RIGHT ARM  Final   Special Requests   Final    BOTTLES DRAWN AEROBIC AND ANAEROBIC 10CC BOTH BOTTLES   Culture   Final           BLOOD CULTURE RECEIVED NO GROWTH TO DATE CULTURE WILL BE HELD FOR 5 DAYS BEFORE ISSUING A FINAL NEGATIVE REPORT Performed at [facility]    Report Status PENDING  Incomplete  Clostridium Difficile by PCR     Status: None   Collection Time: 02/07/15  4:10 PM  Result Value Ref Range Status   C difficile by pcr NEGATIVE NEGATIVE Final     Studies: No results found.  Scheduled  Meds: . amLODipine  10 mg Oral Daily  . clopidogrel  75 mg Oral Daily  . docusate sodium  100 mg Oral BID  . enoxaparin (LOVENOX) injection  0.5 mg/kg Subcutaneous Daily  . guaiFENesin  600 mg Oral BID  . insulin aspart  0-5 Units Subcutaneous QHS  . insulin aspart  0-9 Units  Subcutaneous TID WC  . insulin glargine  10 Units Subcutaneous q morning - 10a  . ipratropium  0.5 mg Nebulization TID  . levalbuterol  0.63 mg Nebulization TID  . levofloxacin  500 mg Oral Daily  . metoprolol tartrate  100 mg Oral BID  . sodium chloride  3 mL Intravenous Q12H   Continuous Infusions: . sodium chloride 75 mL/hr at 02/11/15 1547    Time Spent: 20 min   Charlynne Cousins  [facility] Pager [phone]. If 7PM-7AM, please contact night-coverage at [email or web address], password [email or web address] 02/12/2015, 8:43 AM  LOS: 7 days

## 2015-02-12 NOTE — Progress Notes (Signed)
Physical Therapy Treatment Patient Details Name: Gregory [name] [name] MRN: [id number] DOB: [birth year] Today's Date: Gregory [name] [name]    History of Present Illness Pt is a 64 year old male admitted 02/05/15 with increased SOB, temp, tachycardia, hypoxia with diagnosis of acute respiratory failure with hypoxia due to community-acquired pneumonia/sepsis    PT Comments    Pt denies SOB today.  SpO2 92% on room air and 92-95% during ambulation on room air.  Pt reports possible d/c home tomorrow.  Follow Up Recommendations  No PT follow up     Equipment Recommendations  None recommended by PT    Recommendations for Other Services       Precautions / Restrictions Precautions Precautions: Fall Precaution Comments: monitor sats    Mobility  Bed Mobility Overal bed mobility: Modified Independent                Transfers Overall transfer level: Modified independent                  Ambulation/Gait Ambulation/Gait assistance: Supervision Ambulation Distance (Feet): 200 Feet Assistive device: None Gait Pattern/deviations: WFL(Within Functional Limits)     General Gait Details: no unsteadiness, denies SOB, monitored SPO2 which remained 92-95% on room air   Stairs            Wheelchair Mobility    Modified Rankin (Stroke Patients Only)       Balance                                    Cognition Arousal/Alertness: Awake/alert Behavior During Therapy: WFL for tasks assessed/performed Overall Cognitive Status: Within Functional Limits for tasks assessed                      Exercises      General Comments        Pertinent Vitals/Pain Pain Assessment: No/denies pain    Home Living                      Prior Function            PT Goals (current goals can now be found in the care plan section) Progress towards PT goals: Progressing toward goals    Frequency  Min 3X/week    PT Plan Current plan remains  appropriate    Co-evaluation             End of Session   Activity Tolerance: Patient tolerated treatment well Patient left: in bed;with call bell/phone within reach     Time: 0347-4259 PT Time Calculation (min) (ACUTE ONLY): 17 min  Charges:  $Gait Training: 8-22 mins                    G Codes:      Gregory [name] [name],Gregory [name] [name], Gregory [name] [name], Gregory [name] [name] Carmelia Bake, PT, DPT Gregory [name] [name]/05/[name] Pager: [phone]

## 2015-02-12 NOTE — Progress Notes (Signed)
Patient Name: Gregory [name] Date of Encounter: 02/12/2015     Active Problems:   HTN (hypertension)   Coronary atherosclerosis of native coronary artery   Diabetes mellitus type 2, uncontrolled   SIRS (systemic inflammatory response syndrome)   Acute on chronic renal failure   Chronic diastolic heart failure   Acute respiratory failure with hypoxia   Sepsis   Hyperbilirubinemia    SUBJECTIVE  No further episodes of ventricular tachycardia.  Patient has had no symptoms of dizziness or syncope.  He is tolerating the higher dose of metoprolol 100 mg twice a day.  Potassium is still below target of 4.0  CURRENT MEDS . amLODipine  10 mg Oral Daily  . clopidogrel  75 mg Oral Daily  . docusate sodium  100 mg Oral BID  . enoxaparin (LOVENOX) injection  0.5 mg/kg Subcutaneous Daily  . guaiFENesin  600 mg Oral BID  . insulin aspart  0-5 Units Subcutaneous QHS  . insulin aspart  0-9 Units Subcutaneous TID WC  . insulin glargine  10 Units Subcutaneous q morning - 10a  . ipratropium  0.5 mg Nebulization TID  . levalbuterol  0.63 mg Nebulization TID  . levofloxacin  500 mg Oral Daily  . metoprolol tartrate  100 mg Oral BID  . sodium chloride  3 mL Intravenous Q12H    OBJECTIVE  Filed Vitals:   02/11/15 1534 02/11/15 2228 02/12/15 0450 02/12/15 0801  BP: 142/80 147/80 155/92   Pulse: 83 75    Temp: 98.4 F (36.9 C) 97.8 F (36.6 C)    TempSrc: Oral Oral Oral   Resp: 18 18 18    Height:      Weight:      SpO2: 93% 96% 92% 87%    Intake/Output Summary (Last 24 hours) at 02/12/15 0841 Last data filed at 02/12/15 0500  Gross per 24 hour  Intake 1471.25 ml  Output    200 ml  Net 1271.25 ml   Filed Weights   02/09/15 0500 02/10/15 0451 02/11/15 0500  Weight: 217 lb 2.5 oz (98.5 kg) 217 lb 9.5 oz (98.7 kg) 216 lb 12.8 oz (98.34 kg)    PHYSICAL EXAM  General: Pleasant, NAD. Neuro: Alert and oriented X 3. Moves all extremities spontaneously. Psych: Normal  affect. HEENT:  Normal  Neck: Supple without bruits or JVD. Lungs:  Crackles at bases Heart: RRR no s3, s4, or murmurs. Abdomen: Soft, non-tender, non-distended, BS + x 4.  Extremities: No clubbing, cyanosis or edema. DP/PT/Radials 2+ and equal bilaterally.  Accessory Clinical Findings  CBC  Recent Labs  02/10/15 0329 02/11/15 0515  WBC 12.4* 10.5  HGB 11.7* 12.2*  HCT 36.1* 38.8*  MCV 89.8 90.4  PLT 303 779   Basic Metabolic Panel  Recent Labs  02/11/15 0515 02/12/15 0527  NA 138 138  K 4.1 3.8  CL 106 105  CO2 26 25  GLUCOSE 143* 193*  BUN 44* 41*  CREATININE 2.07* 1.80*  CALCIUM 8.0* 8.0*  MG 2.5  --    Liver Function Tests  Recent Labs  02/11/15 0515 02/12/15 0527  AST 69* 73*  ALT 66* 69*  ALKPHOS 224* 246*  BILITOT 1.2 1.0  PROT 6.4 6.3  ALBUMIN 2.2* 2.3*   No results for input(s): LIPASE, AMYLASE in the last 72 hours. Cardiac Enzymes No results for input(s): CKTOTAL, CKMB, CKMBINDEX, TROPONINI in the last 72 hours. BNP Invalid input(s): POCBNP D-Dimer No results for input(s): DDIMER in the last 72 hours. Hemoglobin  A1C No results for input(s): HGBA1C in the last 72 hours. Fasting Lipid Panel No results for input(s): CHOL, HDL, LDLCALC, TRIG, CHOLHDL, LDLDIRECT in the last 72 hours. Thyroid Function Tests No results for input(s): TSH, T4TOTAL, T3FREE, THYROIDAB in the last 72 hours.  Invalid input(s): FREET3  TELE  Normal sinus rhythm 90 per minute  ECG    Radiology/Studies  Ct Abdomen Pelvis Wo Contrast  02/07/2015   CLINICAL DATA:  Diarrhea, fever, cough, and worsening shortness of breath for 1 week. Sepsis and acute renal failure. History of prostate cancer.  EXAM: CT CHEST, ABDOMEN AND PELVIS WITHOUT CONTRAST  TECHNIQUE: Multidetector CT imaging of the chest, abdomen and pelvis was performed following the standard protocol without IV contrast.  COMPARISON:  Three-way abdominal series earlier today. CT pelvis 12/15/2013.   FINDINGS: CT CHEST FINDINGS  Mildly enlarged right paratracheal lymph nodes measure up to 11 mm in short axis, most likely reactive. No definite enlarged hilar lymph nodes are identified, however evaluation is limited given lack of IV contrast. Three-vessel coronary artery calcification is noted. The heart is mildly enlarged. There is a trace right pleural effusion. There is no pericardial effusion.  Major airways are patent. There is consolidation with air bronchograms involving much of the right lower lobe. Minimal opacity in the left lower lobe likely represents atelectasis. No lytic or blastic osseous lesions are identified.  CT ABDOMEN AND PELVIS FINDINGS  The liver, gallbladder, spleen, adrenal glands, and pancreas have an unremarkable unenhanced appearance. Small hypodensities in the kidneys are most compatible with cysts, measuring up to 1.2 cm on the right and 2.3 cm on the left. There is mild, symmetric perinephric stranding.  Oral contrast is present in distal small bowel and colon to the level of the proximal sigmoid colon without evidence of obstruction. There is mild colonic diverticulosis without evidence of diverticulitis. The prostate is absent. Bladder is unremarkable. Diffuse aortoiliac atherosclerotic calcification is noted.  There is an oval low-density lesion measuring 4.5 x 3.0 cm along the left external iliac chain, new from the prior CT. A similar but smaller lesion along the right external iliac chain is also new, measuring 2.0 x 1.3 cm. No enlarged lymph nodes are identified elsewhere in the abdomen or pelvis. There is no free fluid. There is atrophy of the left psoas muscle. Degenerative changes are again noted in the thoracolumbar spine, hips, and SI joints.  IMPRESSION: 1. Right lower lobe pneumonia. 2. No acute abnormality identified in the abdomen or pelvis. 3. Two new low-density lesions along the external iliac chains, likely small lymphoceles.   Electronically Signed   By: Logan Bores   On: 02/07/2015 19:14   Ct Chest Wo Contrast  02/07/2015   CLINICAL DATA:  Diarrhea, fever, cough, and worsening shortness of breath for 1 week. Sepsis and acute renal failure. History of prostate cancer.  EXAM: CT CHEST, ABDOMEN AND PELVIS WITHOUT CONTRAST  TECHNIQUE: Multidetector CT imaging of the chest, abdomen and pelvis was performed following the standard protocol without IV contrast.  COMPARISON:  Three-way abdominal series earlier today. CT pelvis 12/15/2013.  FINDINGS: CT CHEST FINDINGS  Mildly enlarged right paratracheal lymph nodes measure up to 11 mm in short axis, most likely reactive. No definite enlarged hilar lymph nodes are identified, however evaluation is limited given lack of IV contrast. Three-vessel coronary artery calcification is noted. The heart is mildly enlarged. There is a trace right pleural effusion. There is no pericardial effusion.  Major airways are patent. There  is consolidation with air bronchograms involving much of the right lower lobe. Minimal opacity in the left lower lobe likely represents atelectasis. No lytic or blastic osseous lesions are identified.  CT ABDOMEN AND PELVIS FINDINGS  The liver, gallbladder, spleen, adrenal glands, and pancreas have an unremarkable unenhanced appearance. Small hypodensities in the kidneys are most compatible with cysts, measuring up to 1.2 cm on the right and 2.3 cm on the left. There is mild, symmetric perinephric stranding.  Oral contrast is present in distal small bowel and colon to the level of the proximal sigmoid colon without evidence of obstruction. There is mild colonic diverticulosis without evidence of diverticulitis. The prostate is absent. Bladder is unremarkable. Diffuse aortoiliac atherosclerotic calcification is noted.  There is an oval low-density lesion measuring 4.5 x 3.0 cm along the left external iliac chain, new from the prior CT. A similar but smaller lesion along the right external iliac chain is also new,  measuring 2.0 x 1.3 cm. No enlarged lymph nodes are identified elsewhere in the abdomen or pelvis. There is no free fluid. There is atrophy of the left psoas muscle. Degenerative changes are again noted in the thoracolumbar spine, hips, and SI joints.  IMPRESSION: 1. Right lower lobe pneumonia. 2. No acute abnormality identified in the abdomen or pelvis. 3. Two new low-density lesions along the external iliac chains, likely small lymphoceles.   Electronically Signed   By: Logan Bores   On: 02/07/2015 19:14   Dg Chest Port 1 View  02/10/2015   CLINICAL DATA:  Respiratory failure, shortness of breath.  EXAM: PORTABLE CHEST - 1 VIEW  COMPARISON:  02/08/2015  FINDINGS: Enlarged cardiac silhouette. Central vascular congestion. Right lower lobe airspace opacity. Linear left lung base opacity, favor atelectasis. Mild increased interstitial markings. Small pleural effusions not excluded. No pneumothorax. Multilevel degenerative changes.  IMPRESSION: Right lung consolidation is most in keeping with pneumonia.  Central vascular congestion. Interstitial edema not excluded.   Electronically Signed   By: Carlos Levering M.D.   On: 02/10/2015 06:23   Dg Chest Port 1 View  02/08/2015   CLINICAL DATA:  Respiratory failure.  EXAM: PORTABLE CHEST - 1 VIEW  COMPARISON:  CT 02/07/2015.  Chest x-ray 02/06/2015.  FINDINGS: Cardiomegaly with pulmonary venous congestion and bilateral infiltrates. Small right pleural effusion. These findings are consistent with congestive heart failure. Underlying pneumonia particularly in the right lower lobe cannot be excluded. No pneumothorax. No acute bony abnormality.  IMPRESSION: 1. Congestive heart failure with bilateral pulmonary edema and small right pleural effusion. 2. Persistent underlying right lower lobe pneumonia cannot be excluded .   Electronically Signed   By: Marcello Moores  Register   On: 02/08/2015 07:19   Dg Chest Port 1 View  02/06/2015   CLINICAL DATA:  Worsening dyspnea and  fever. Short of breath today. History of hypertension.  EXAM: PORTABLE CHEST - 1 VIEW  COMPARISON:  02/05/2015.  FINDINGS: Since prior exam, bilateral interstitial thickening has increased. Mild hazy airspace opacity is noted in the medial right lung base and lateral left lung base. There is no convincing pleural effusion and no pneumothorax.  Cardiac silhouette is mildly enlarged. No mediastinal or hilar masses.  Bony thorax is grossly intact.  IMPRESSION: 1. Increased interstitial thickening when compared the prior study and with mild hazy airspace opacity at the lung bases. Findings could reflect pulmonary edema. However, given the history of fever, diffuse interstitial pneumonia is suspected.   Electronically Signed   By: Dedra Skeens.D.  On: 02/06/2015 09:27   Dg Chest Port 1 View  (if Code Sepsis Called)  02/05/2015   CLINICAL DATA:  Shortness of breath. Dizziness. Shortness of breath for 2 days. Dizziness is today. History of diabetes.  EXAM: PORTABLE CHEST - 1 VIEW  COMPARISON:  12/15/2013  FINDINGS: Cardiac silhouette normal in size configuration. No mediastinal or hilar masses. Lungs are clear. No pleural effusion or pneumothorax.  Bony thorax is intact.  IMPRESSION: No acute cardiopulmonary disease.   Electronically Signed   By: Lajean Manes M.D.   On: 02/05/2015 19:15   Dg Abd Acute W/chest  02/07/2015   CLINICAL DATA:  64 year old male with a history of shortness of breath.  EXAM: DG ABDOMEN ACUTE W/ 1V CHEST  COMPARISON:  Chest x-ray 02/06/2015, CT pelvis 12/15/2013  FINDINGS: Chest:  Cardiomediastinal silhouette unchanged in size and contour with cardiomegaly. Mediastinal border somewhat obscures secondary to the apical lordotic positioning.  Pulmonary vascular congestion.  Linear and patchy opacities at the bilateral lung bases with obscuration of the retrocardiac region. No pneumothorax.  Decubitus chest image demonstrates no layering pleural effusion along the right mediastinum.   Abdomen:  Small bowel loops and colon filled with gas. Borderline dilation of central small bowel loops. Body habitus limits evaluation the abdomen.  Decubitus image limited by respiratory motion, though there are air-fluid levels present.  No displaced fracture.  IMPRESSION: Chest:  Linear and patchy opacities at the lung bases may reflect atelectasis, edema, and/or consolidation. No layering pleural effusion.  Abdomen:  Limited abdominal plain film demonstrates nonspecific bowel gas pattern with borderline dilated small bowel and several air-fluid levels. If there is concern for acute intra-abdominal process, CT would be recommended.  Signed,  Dulcy Fanny. Earleen Newport, DO  [facility]  [facility]   Electronically Signed   By: Corrie Mckusick D.O.   On: 02/07/2015 11:51   US Abdomen Limited Ruq  02/08/2015   CLINICAL DATA:  Initial encounter for elevated LFTs with hyperbilirubinemia  EXAM: US ABDOMEN LIMITED - RIGHT UPPER QUADRANT  COMPARISON:  CT scan 02/07/2015  FINDINGS: Gallbladder:  No gallstones or wall thickening visualized. No sonographic Murphy sign noted.  Common bile duct:  Diameter: Nondilated at 3 mm diameter.  Liver:  No focal lesion identified. Within normal limits in parenchymal echogenicity.  Other: Incidental imaging of the right kidney shows increased echogenicity. 14 mm hypoechoic structure near the upper pole is likely a cyst.  IMPRESSION: No evidence for biliary dilatation.  No gallstones.   Electronically Signed   By: Misty Stanley M.D.   On: 02/08/2015 15:24    ASSESSMENT AND PLAN 1.  Nonsustained ventricular tachycardia, asymptomatic.  No recurrence. Plan: Continue higher dose of beta blocker 100 mg twice a day.  Replete potassium.  2.  Coronary artery disease.  Overlapping LAD stents placed in 2013.  Continue Plavix  3.  Essential hypertension  4.  Right lower lobe pneumonia   Signed, Darlin Coco MD

## 2015-02-13 DIAGNOSIS — J189 Pneumonia, unspecified organism: Secondary | ICD-10-CM

## 2015-02-13 LAB — CULTURE, BLOOD (ROUTINE X 2)
Culture: NO GROWTH
Culture: NO GROWTH

## 2015-02-13 LAB — BASIC METABOLIC PANEL
Anion gap: 8 (ref 5–15)
BUN: 31 mg/dL — ABNORMAL HIGH (ref 6–23)
CO2: 27 mmol/L (ref 19–32)
Calcium: 8.3 mg/dL — ABNORMAL LOW (ref 8.4–10.5)
Chloride: 108 mmol/L (ref 96–112)
Creatinine, Ser: 1.66 mg/dL — ABNORMAL HIGH (ref 0.50–1.35)
GFR calc Af Amer: 49 mL/min — ABNORMAL LOW (ref >90)
GFR calc non Af Amer: 42 mL/min — ABNORMAL LOW (ref >90)
Glucose, Bld: 134 mg/dL — ABNORMAL HIGH (ref 70–99)
Potassium: 4.1 mmol/L (ref 3.5–5.1)
Sodium: 143 mmol/L (ref 135–145)

## 2015-02-13 LAB — GLUCOSE, CAPILLARY: Glucose-Capillary: 122 mg/dL — ABNORMAL HIGH (ref 70–99)

## 2015-02-13 MED ORDER — LEVOFLOXACIN 500 MG PO TABS: 500.0000 mg | ORAL_TABLET | Freq: Every day | ORAL | Status: DC

## 2015-02-13 MED ORDER — METOPROLOL TARTRATE 100 MG PO TABS: 100.0000 mg | ORAL_TABLET | Freq: Two times a day (BID) | ORAL | Status: DC

## 2015-02-13 NOTE — Discharge Summary (Addendum)
Physician Discharge Summary  Gregory [name] QIO:[id number] DOB: [birth year] DOA: 02/05/2015  PCP: Kandice Hams, MD  Admit date: 02/05/2015 Discharge date: 02/13/2015  Time spent: 25 minutes  Recommendations for Outpatient Follow-up:  1. Follow-up with primary care doctor in 2 weeks. 2. Check blood pressure and titrate antihypertensive medications as needed.   Discharge Diagnoses:  Principal Problem:   CAP (community acquired pneumonia) Active Problems:   HTN (hypertension)   Coronary atherosclerosis of native coronary artery   Diabetes mellitus type 2, uncontrolled   SIRS (systemic inflammatory response syndrome)   Acute on chronic renal failure   Chronic diastolic heart failure   Acute respiratory failure with hypoxia   Sepsis   Hyperbilirubinemia   Discharge Condition: stable  Diet recommendation: regular  Filed Weights   02/09/15 0500 02/10/15 0451 02/11/15 0500  Weight: 98.5 kg (217 lb 2.5 oz) 98.7 kg (217 lb 9.5 oz) 98.34 kg (216 lb 12.8 oz)    History of present illness:  64 y.o. male   has a past medical history of Hypertension; Hyperlipidemia; Coronary artery disease; Swelling of left lower extremity; Myocardial infarction (05/2012); Type II diabetes mellitus; Gout; Arthritis; and Cancer.  Presented with  1 week history of worsening shortness of breath, coughing and lightheadedness. Patient is concerned because he was visiting his wife at the nursing facility who is currently dealing with pneumonia. Patient has been gradually getting worse in the past 3 days. He reports poor PO intake. Feels lightheaded when he sits up. Denied any chest pain no nausea vomiting no diarrhea no syncope. On arrival to [facility] he was noted to be febrile up to 104. Patient was noted to be acute tachycardic as high as 118. Tachypneic up to 41. Patient met sepsis criteria. He was started on broad-spectrum antibiotics and aggressive IV fluid resuscitation. He was given tamiflu.  Of note patient has not been hypotensive. Lactic acid was noted to be normal. White blood cell count was 10 UA showed no evidence of UTI. Influenza swab has been ordered. Chest x-ray showed no evidence of pneumonia. He is unsure if he had flu shot. Denies any travel history.   Hospital Course:  Acute respiratory failure with hypoxia due to community-acquired pneumonia/sepsis: - Started on empiric vancomycin and cefepime on admission. - Ct of the abdomen pelvis show no acute findings except for right lower lobe pneumonia. - Blood cultures negative 4. Influenza PCR and RSV panel negative. - Repeat a chest x-ray of 02/10/2015 showed a right lower lobe pneumonia. - Lactic acid has been less than 2 since admission., Procalcitonin has improved since admission. - C. difficile negative. - Antibiotics subsequently  Descalated to Levaquin.  Transaminitis/hyperbilirubinemia: - Due to sepsis, abdominal ultrasound showed no acute findings. - Acute hepatitis panel negative.  Controlled diabetes mellitus type 2 - Well-controlled Lantus and sliding scale insulin continue current management.  Acute on chronic kidney disease stage III: - Baseline creatinine 1.4-1.7. - Acute injury multifactorial due to sepsis ARB and NSAID use. Held nephrotoxic drugs. - CT abdomen and pelvis show no hydronephrosis, creatinine improving with hydration. - resume ARB as an outpatient.  Coronary artery disease: - Continue metoprolol and Plavix. 2-D echo showed an EF of 55% with grade 1 diastolic heart failure.  Resting Tremor: - Likely due to acute medical's illness no resolved.  Lower extremity edema: - Venous Doppler negative for DVT.  Nonsustained atrial tachycardia: - Patient asymptomatic cardiology consulted recommended echocardiogram. Potassium and magnesium within normal limits. - Cardiology recommended to continue to  titrate metoprolol while monitoring heart rate.   Procedures:  CXR  CT abd and  pelvis  Consultations:  A&O x3  Discharge Exam: Filed Vitals:   02/13/15 0544  BP: 178/87  Pulse: 79  Temp: 98 F (36.7 C)  Resp: 18    General: A&O x3 Cardiovascular: RRR Respiratory: good air movement CTA B/L  Discharge Instructions   Discharge Instructions    Diet - low sodium heart healthy    Complete by:  As directed      Increase activity slowly    Complete by:  As directed           Current Discharge Medication List    START taking these medications   Details  levofloxacin (LEVAQUIN) 500 MG tablet Take 1 tablet (500 mg total) by mouth daily. Qty: 1 tablet, Refills: 0      CONTINUE these medications which have CHANGED   Details  metoprolol (LOPRESSOR) 100 MG tablet Take 1 tablet (100 mg total) by mouth 2 (two) times daily. Qty: 60 tablet, Refills: 3      CONTINUE these medications which have NOT CHANGED   Details  allopurinol (ZYLOPRIM) 100 MG tablet Take 300 mg by mouth daily as needed (gout).     amLODipine-valsartan (EXFORGE) 10-320 MG per tablet Take 1 tablet by mouth daily.    aspirin-acetaminophen-caffeine (EXCEDRIN MIGRAINE) 250-250-65 MG per tablet Take 1 tablet by mouth every 6 (six) hours as needed for headache or migraine (migraine).    clopidogrel (PLAVIX) 75 MG tablet TAKE 1 TABLET (75 MG TOTAL) BY MOUTH DAILY. Qty: 90 tablet, Refills: 0    insulin glargine (LANTUS) 100 UNIT/ML injection Inject 25 Units into the skin every morning.     metFORMIN (GLUCOPHAGE) 1000 MG tablet Take 1,000 mg by mouth 2 (two) times daily with a meal.    naproxen sodium (ANAPROX) 220 MG tablet Take 220 mg by mouth daily as needed (pain).     nitroGLYCERIN (NITROSTAT) 0.4 MG SL tablet Place 0.4 mg under the tongue every 5 (five) minutes as needed for chest pain.       No Known Allergies Follow-up Information    Follow up with POLITE,RONALD D, MD In 2 weeks.   Specialty:  Internal Medicine   Why:  hospital follow up   Contact information:   [address] [place]  [address] [phone]        The results of significant diagnostics from this hospitalization (including imaging, microbiology, ancillary and laboratory) are listed below for reference.    Significant Diagnostic Studies: Ct Abdomen Pelvis Wo Contrast  02/07/2015   CLINICAL DATA:  Diarrhea, fever, cough, and worsening shortness of breath for 1 week. Sepsis and acute renal failure. History of prostate cancer.  EXAM: CT CHEST, ABDOMEN AND PELVIS WITHOUT CONTRAST  TECHNIQUE: Multidetector CT imaging of the chest, abdomen and pelvis was performed following the standard protocol without IV contrast.  COMPARISON:  Three-way abdominal series earlier today. CT pelvis 12/15/2013.  FINDINGS: CT CHEST FINDINGS  Mildly enlarged right paratracheal lymph nodes measure up to 11 mm in short axis, most likely reactive. No definite enlarged hilar lymph nodes are identified, however evaluation is limited given lack of IV contrast. Three-vessel coronary artery calcification is noted. The heart is mildly enlarged. There is a trace right pleural effusion. There is no pericardial effusion.  Major airways are patent. There is consolidation with air bronchograms involving much of the right lower lobe. Minimal opacity in the left lower  lobe likely represents atelectasis. No lytic or blastic osseous lesions are identified.  CT ABDOMEN AND PELVIS FINDINGS  The liver, gallbladder, spleen, adrenal glands, and pancreas have an unremarkable unenhanced appearance. Small hypodensities in the kidneys are most compatible with cysts, measuring up to 1.2 cm on the right and 2.3 cm on the left. There is mild, symmetric perinephric stranding.  Oral contrast is present in distal small bowel and colon to the level of the proximal sigmoid colon without evidence of obstruction. There is mild colonic diverticulosis without evidence of diverticulitis. The prostate is absent. Bladder is unremarkable. Diffuse  aortoiliac atherosclerotic calcification is noted.  There is an oval low-density lesion measuring 4.5 x 3.0 cm along the left external iliac chain, new from the prior CT. A similar but smaller lesion along the right external iliac chain is also new, measuring 2.0 x 1.3 cm. No enlarged lymph nodes are identified elsewhere in the abdomen or pelvis. There is no free fluid. There is atrophy of the left psoas muscle. Degenerative changes are again noted in the thoracolumbar spine, hips, and SI joints.  IMPRESSION: 1. Right lower lobe pneumonia. 2. No acute abnormality identified in the abdomen or pelvis. 3. Two new low-density lesions along the external iliac chains, likely small lymphoceles.   Electronically Signed   By: Logan Bores   On: 02/07/2015 19:14   Ct Chest Wo Contrast  02/07/2015   CLINICAL DATA:  Diarrhea, fever, cough, and worsening shortness of breath for 1 week. Sepsis and acute renal failure. History of prostate cancer.  EXAM: CT CHEST, ABDOMEN AND PELVIS WITHOUT CONTRAST  TECHNIQUE: Multidetector CT imaging of the chest, abdomen and pelvis was performed following the standard protocol without IV contrast.  COMPARISON:  Three-way abdominal series earlier today. CT pelvis 12/15/2013.  FINDINGS: CT CHEST FINDINGS  Mildly enlarged right paratracheal lymph nodes measure up to 11 mm in short axis, most likely reactive. No definite enlarged hilar lymph nodes are identified, however evaluation is limited given lack of IV contrast. Three-vessel coronary artery calcification is noted. The heart is mildly enlarged. There is a trace right pleural effusion. There is no pericardial effusion.  Major airways are patent. There is consolidation with air bronchograms involving much of the right lower lobe. Minimal opacity in the left lower lobe likely represents atelectasis. No lytic or blastic osseous lesions are identified.  CT ABDOMEN AND PELVIS FINDINGS  The liver, gallbladder, spleen, adrenal glands, and pancreas  have an unremarkable unenhanced appearance. Small hypodensities in the kidneys are most compatible with cysts, measuring up to 1.2 cm on the right and 2.3 cm on the left. There is mild, symmetric perinephric stranding.  Oral contrast is present in distal small bowel and colon to the level of the proximal sigmoid colon without evidence of obstruction. There is mild colonic diverticulosis without evidence of diverticulitis. The prostate is absent. Bladder is unremarkable. Diffuse aortoiliac atherosclerotic calcification is noted.  There is an oval low-density lesion measuring 4.5 x 3.0 cm along the left external iliac chain, new from the prior CT. A similar but smaller lesion along the right external iliac chain is also new, measuring 2.0 x 1.3 cm. No enlarged lymph nodes are identified elsewhere in the abdomen or pelvis. There is no free fluid. There is atrophy of the left psoas muscle. Degenerative changes are again noted in the thoracolumbar spine, hips, and SI joints.  IMPRESSION: 1. Right lower lobe pneumonia. 2. No acute abnormality identified in the abdomen or pelvis. 3. Two  new low-density lesions along the external iliac chains, likely small lymphoceles.   Electronically Signed   By: Logan Bores   On: 02/07/2015 19:14   Dg Chest Port 1 View  02/10/2015   CLINICAL DATA:  Respiratory failure, shortness of breath.  EXAM: PORTABLE CHEST - 1 VIEW  COMPARISON:  02/08/2015  FINDINGS: Enlarged cardiac silhouette. Central vascular congestion. Right lower lobe airspace opacity. Linear left lung base opacity, favor atelectasis. Mild increased interstitial markings. Small pleural effusions not excluded. No pneumothorax. Multilevel degenerative changes.  IMPRESSION: Right lung consolidation is most in keeping with pneumonia.  Central vascular congestion. Interstitial edema not excluded.   Electronically Signed   By: Carlos Levering M.D.   On: 02/10/2015 06:23   Dg Chest Port 1 View  02/08/2015   CLINICAL DATA:   Respiratory failure.  EXAM: PORTABLE CHEST - 1 VIEW  COMPARISON:  CT 02/07/2015.  Chest x-ray 02/06/2015.  FINDINGS: Cardiomegaly with pulmonary venous congestion and bilateral infiltrates. Small right pleural effusion. These findings are consistent with congestive heart failure. Underlying pneumonia particularly in the right lower lobe cannot be excluded. No pneumothorax. No acute bony abnormality.  IMPRESSION: 1. Congestive heart failure with bilateral pulmonary edema and small right pleural effusion. 2. Persistent underlying right lower lobe pneumonia cannot be excluded .   Electronically Signed   By: Marcello Moores  Register   On: 02/08/2015 07:19   Dg Chest Port 1 View  02/06/2015   CLINICAL DATA:  Worsening dyspnea and fever. Short of breath today. History of hypertension.  EXAM: PORTABLE CHEST - 1 VIEW  COMPARISON:  02/05/2015.  FINDINGS: Since prior exam, bilateral interstitial thickening has increased. Mild hazy airspace opacity is noted in the medial right lung base and lateral left lung base. There is no convincing pleural effusion and no pneumothorax.  Cardiac silhouette is mildly enlarged. No mediastinal or hilar masses.  Bony thorax is grossly intact.  IMPRESSION: 1. Increased interstitial thickening when compared the prior study and with mild hazy airspace opacity at the lung bases. Findings could reflect pulmonary edema. However, given the history of fever, diffuse interstitial pneumonia is suspected.   Electronically Signed   By: Lajean Manes M.D.   On: 02/06/2015 09:27   Dg Chest Port 1 View  (if Code Sepsis Called)  02/05/2015   CLINICAL DATA:  Shortness of breath. Dizziness. Shortness of breath for 2 days. Dizziness is today. History of diabetes.  EXAM: PORTABLE CHEST - 1 VIEW  COMPARISON:  12/15/2013  FINDINGS: Cardiac silhouette normal in size configuration. No mediastinal or hilar masses. Lungs are clear. No pleural effusion or pneumothorax.  Bony thorax is intact.  IMPRESSION: No acute  cardiopulmonary disease.   Electronically Signed   By: Lajean Manes M.D.   On: 02/05/2015 19:15   Dg Abd Acute W/chest  02/07/2015   CLINICAL DATA:  64 year old male with a history of shortness of breath.  EXAM: DG ABDOMEN ACUTE W/ 1V CHEST  COMPARISON:  Chest x-ray 02/06/2015, CT pelvis 12/15/2013  FINDINGS: Chest:  Cardiomediastinal silhouette unchanged in size and contour with cardiomegaly. Mediastinal border somewhat obscures secondary to the apical lordotic positioning.  Pulmonary vascular congestion.  Linear and patchy opacities at the bilateral lung bases with obscuration of the retrocardiac region. No pneumothorax.  Decubitus chest image demonstrates no layering pleural effusion along the right mediastinum.  Abdomen:  Small bowel loops and colon filled with gas. Borderline dilation of central small bowel loops. Body habitus limits evaluation the abdomen.  Decubitus image limited  by respiratory motion, though there are air-fluid levels present.  No displaced fracture.  IMPRESSION: Chest:  Linear and patchy opacities at the lung bases may reflect atelectasis, edema, and/or consolidation. No layering pleural effusion.  Abdomen:  Limited abdominal plain film demonstrates nonspecific bowel gas pattern with borderline dilated small bowel and several air-fluid levels. If there is concern for acute intra-abdominal process, CT would be recommended.  Signed,  Yvone Neu. Loreta Ave, DO  [facility]  [facility]   Electronically Signed   By: Gilmer Mor D.O.   On: 02/07/2015 11:51   US Abdomen Limited Ruq  02/08/2015   CLINICAL DATA:  Initial encounter for elevated LFTs with hyperbilirubinemia  EXAM: US ABDOMEN LIMITED - RIGHT UPPER QUADRANT  COMPARISON:  CT scan 02/07/2015  FINDINGS: Gallbladder:  No gallstones or wall thickening visualized. No sonographic Murphy sign noted.  Common bile duct:  Diameter: Nondilated at 3 mm diameter.  Liver:  No focal lesion identified.  Within normal limits in parenchymal echogenicity.  Other: Incidental imaging of the right kidney shows increased echogenicity. 14 mm hypoechoic structure near the upper pole is likely a cyst.  IMPRESSION: No evidence for biliary dilatation.  No gallstones.   Electronically Signed   By: Kennith Center M.D.   On: 02/08/2015 15:24    Microbiology: Recent Results (from the past 240 hour(s))  Blood Culture (routine x 2)     Status: None   Collection Time: 02/05/15  6:45 PM  Result Value Ref Range Status   Specimen Description BLOOD LEFT ARM  Final   Special Requests BOTTLES DRAWN AEROBIC AND ANAEROBIC 5CC  Final   Culture   Final    NO GROWTH 5 DAYS Performed at [facility]    Report Status 02/12/2015 FINAL  Final  Blood Culture (routine x 2)     Status: None   Collection Time: 02/05/15  6:45 PM  Result Value Ref Range Status   Specimen Description BLOOD RIGHT FOREARM  Final   Special Requests BOTTLES DRAWN AEROBIC AND ANAEROBIC 5CC  Final   Culture   Final    NO GROWTH 5 DAYS Performed at [facility]    Report Status 02/12/2015 FINAL  Final  Urine culture     Status: None   Collection Time: 02/05/15  6:52 PM  Result Value Ref Range Status   Specimen Description URINE, RANDOM  Final   Special Requests NONE  Final   Colony Count   Final    15,000 COLONIES/ML Performed at [facility]    Culture   Final    DIPHTHEROIDS(CORYNEBACTERIUM SPECIES) Note: Standardized susceptibility testing for this organism is not available. Performed at [facility]    Report Status 02/07/2015 FINAL  Final  Respiratory virus panel     Status: None   Collection Time: 02/06/15 10:46 AM  Result Value Ref Range Status   Source - RVPAN NOSE  Corrected   Respiratory Syncytial Virus A Negative Negative Final   Respiratory Syncytial Virus B Negative Negative Final   Influenza A Negative Negative Final   Influenza B Negative Negative Final   Parainfluenza 1 Negative  Negative Final   Parainfluenza 2 Negative Negative Final   Parainfluenza 3 Negative Negative Final   Metapneumovirus Negative Negative Final   Rhinovirus Negative Negative Final   Adenovirus Negative Negative Final    Comment: (NOTE) Performed At: [facility] [address] [place], [place] [id number] Mila Homer MD [id number]  MRSA PCR Screening     Status: None   Collection Time: 02/06/15  5:02 PM  Result Value Ref Range Status   MRSA by PCR NEGATIVE NEGATIVE Final    Comment:        The GeneXpert MRSA Assay (FDA approved for NASAL specimens only), is one component of a comprehensive MRSA colonization surveillance program. It is not intended to diagnose MRSA infection nor to guide or monitor treatment for MRSA infections.   Culture, blood (routine x 2)     Status: None (Preliminary result)   Collection Time: 02/07/15  2:07 PM  Result Value Ref Range Status   Specimen Description BLOOD LEFT ARM  Final   Special Requests   Final    BOTTLES DRAWN AEROBIC AND ANAEROBIC 10CC BOTH BOTTLES   Culture   Final           BLOOD CULTURE RECEIVED NO GROWTH TO DATE CULTURE WILL BE HELD FOR 5 DAYS BEFORE ISSUING A FINAL NEGATIVE REPORT Performed at [facility]    Report Status PENDING  Incomplete  Urine culture     Status: None   Collection Time: 02/07/15  2:09 PM  Result Value Ref Range Status   Specimen Description URINE, RANDOM  Final   Special Requests NONE  Final   Colony Count NO GROWTH Performed at [facility]   Final   Culture NO GROWTH Performed at [facility]   Final   Report Status 02/08/2015 FINAL  Final  Culture, blood (routine x 2)     Status: None (Preliminary result)   Collection Time: 02/07/15  2:14 PM  Result Value Ref Range Status   Specimen Description BLOOD RIGHT ARM  Final   Special Requests   Final    BOTTLES DRAWN AEROBIC AND ANAEROBIC 10CC BOTH BOTTLES   Culture   Final           BLOOD CULTURE RECEIVED NO  GROWTH TO DATE CULTURE WILL BE HELD FOR 5 DAYS BEFORE ISSUING A FINAL NEGATIVE REPORT Note: Culture results may be compromised due to an excessive volume of blood received in culture bottles. Performed at [facility]    Report Status PENDING  Incomplete  Clostridium Difficile by PCR     Status: None   Collection Time: 02/07/15  4:10 PM  Result Value Ref Range Status   C difficile by pcr NEGATIVE NEGATIVE Final     Labs: Basic Metabolic Panel:  Recent Labs Lab 02/09/15 0840 02/10/15 0329 02/11/15 0515 02/12/15 0527 02/13/15 0522  NA 135 138 138 138 143  K 4.2 4.0 4.1 3.8 4.1  CL 98 101 106 105 108  CO2 $Re'25 25 26 25 27  'Nqj$ GLUCOSE 155* 117* 143* 193* 134*  BUN 40* 45* 44* 41* 31*  CREATININE 2.39* 2.32* 2.07* 1.80* 1.66*  CALCIUM 8.0* 8.2* 8.0* 8.0* 8.3*  MG  --   --  2.5  --   --    Liver Function Tests:  Recent Labs Lab 02/07/15 0932 02/08/15 0412 02/09/15 0840 02/11/15 0515 02/12/15 0527  AST 53* 76* 78* 69* 73*  ALT 42 56* 65* 66* 69*  ALKPHOS 132* 180* 196* 224* 246*  BILITOT 1.3* 1.9* 2.3* 1.2 1.0  PROT 6.6 6.9 7.3 6.4 6.3  ALBUMIN 2.8* 2.8* 2.7* 2.2* 2.3*   No results for input(s): LIPASE, AMYLASE in the last 168 hours. No results for input(s): AMMONIA in the last 168 hours. CBC:  Recent Labs Lab 02/07/15 0932 02/08/15 0412 02/09/15  0840 02/10/15 0329 02/11/15 0515  WBC 11.1* 11.4* 12.9* 12.4* 10.5  NEUTROABS  --   --  10.9*  --   --   HGB 12.3* 12.8* 12.1* 11.7* 12.2*  HCT 37.6* 39.2 37.8* 36.1* 38.8*  MCV 90.4 90.1 90.2 89.8 90.4  PLT 204 220 292 303 356   Cardiac Enzymes: No results for input(s): CKTOTAL, CKMB, CKMBINDEX, TROPONINI in the last 168 hours. BNP: BNP (last 3 results)  Recent Labs  02/06/15 0926  BNP 91.2    ProBNP (last 3 results) No results for input(s): PROBNP in the last 8760 hours.  CBG:  Recent Labs Lab 02/12/15 0733 02/12/15 1150 02/12/15 1630 02/12/15 2141 02/13/15 0740  GLUCAP 146* 170* 149* 160*  122*       Signed:  Charlynne Cousins  [facility] 02/13/2015, 8:42 AM

## 2015-02-13 NOTE — Progress Notes (Signed)
Patient Name: Gregory [name] Date of Encounter: 02/13/2015     Active Problems:   HTN (hypertension)   Coronary atherosclerosis of native coronary artery   Diabetes mellitus type 2, uncontrolled   SIRS (systemic inflammatory response syndrome)   Acute on chronic renal failure   Chronic diastolic heart failure   Acute respiratory failure with hypoxia   Sepsis   Hyperbilirubinemia    SUBJECTIVE  No further episodes of ventricular tachycardia.  Patient has had no symptoms of dizziness or syncope.  He is tolerating the higher dose of metoprolol 100 mg twice a day. Potassium is improved.  Kidney function is improved. Tolerated ambulation in hall. CURRENT MEDS . amLODipine  10 mg Oral Daily  . clopidogrel  75 mg Oral Daily  . docusate sodium  100 mg Oral BID  . enoxaparin (LOVENOX) injection  0.5 mg/kg Subcutaneous Daily  . guaiFENesin  600 mg Oral BID  . insulin aspart  0-5 Units Subcutaneous QHS  . insulin aspart  0-9 Units Subcutaneous TID WC  . insulin glargine  10 Units Subcutaneous q morning - 10a  . levofloxacin  500 mg Oral Daily  . metoprolol tartrate  100 mg Oral BID  . sodium chloride  3 mL Intravenous Q12H    OBJECTIVE  Filed Vitals:   02/12/15 1300 02/12/15 1503 02/12/15 2120 02/13/15 0544  BP: 161/77  169/85 178/87  Pulse: 90  81 79  Temp: 98.1 F (36.7 C)  98.2 F (36.8 C) 98 F (36.7 C)  TempSrc: Oral  Oral Oral  Resp: 18  18 18   Height:      Weight:      SpO2: 93% 92% 91% 90%    Intake/Output Summary (Last 24 hours) at 02/13/15 0821 Last data filed at 02/13/15 0445  Gross per 24 hour  Intake    480 ml  Output    850 ml  Net   -370 ml   Filed Weights   02/09/15 0500 02/10/15 0451 02/11/15 0500  Weight: 217 lb 2.5 oz (98.5 kg) 217 lb 9.5 oz (98.7 kg) 216 lb 12.8 oz (98.34 kg)    PHYSICAL EXAM  General: Pleasant, NAD. Neuro: Alert and oriented X 3. Moves all extremities spontaneously. Psych: Normal affect. HEENT:  Normal  Neck:  Supple without bruits or JVD. Lungs:  Crackles at bases Heart: RRR no s3, s4, or murmurs. Abdomen: Soft, non-tender, non-distended, BS + x 4.  Extremities: No clubbing, cyanosis or edema. DP/PT/Radials 2+ and equal bilaterally.  Accessory Clinical Findings  CBC  Recent Labs  02/11/15 0515  WBC 10.5  HGB 12.2*  HCT 38.8*  MCV 90.4  PLT 240   Basic Metabolic Panel  Recent Labs  02/11/15 0515 02/12/15 0527 02/13/15 0522  NA 138 138 143  K 4.1 3.8 4.1  CL 106 105 108  CO2 26 25 27   GLUCOSE 143* 193* 134*  BUN 44* 41* 31*  CREATININE 2.07* 1.80* 1.66*  CALCIUM 8.0* 8.0* 8.3*  MG 2.5  --   --    Liver Function Tests  Recent Labs  02/11/15 0515 02/12/15 0527  AST 69* 73*  ALT 66* 69*  ALKPHOS 224* 246*  BILITOT 1.2 1.0  PROT 6.4 6.3  ALBUMIN 2.2* 2.3*   No results for input(s): LIPASE, AMYLASE in the last 72 hours. Cardiac Enzymes No results for input(s): CKTOTAL, CKMB, CKMBINDEX, TROPONINI in the last 72 hours. BNP Invalid input(s): POCBNP D-Dimer No results for input(s): DDIMER in the last 72 hours.  Hemoglobin A1C No results for input(s): HGBA1C in the last 72 hours. Fasting Lipid Panel No results for input(s): CHOL, HDL, LDLCALC, TRIG, CHOLHDL, LDLDIRECT in the last 72 hours. Thyroid Function Tests No results for input(s): TSH, T4TOTAL, T3FREE, THYROIDAB in the last 72 hours.  Invalid input(s): FREET3  TELE  Normal sinus rhythm   Radiology/Studies  Ct Abdomen Pelvis Wo Contrast  02/07/2015   CLINICAL DATA:  Diarrhea, fever, cough, and worsening shortness of breath for 1 week. Sepsis and acute renal failure. History of prostate cancer.  EXAM: CT CHEST, ABDOMEN AND PELVIS WITHOUT CONTRAST  TECHNIQUE: Multidetector CT imaging of the chest, abdomen and pelvis was performed following the standard protocol without IV contrast.  COMPARISON:  Three-way abdominal series earlier today. CT pelvis 12/15/2013.  FINDINGS: CT CHEST FINDINGS  Mildly enlarged right  paratracheal lymph nodes measure up to 11 mm in short axis, most likely reactive. No definite enlarged hilar lymph nodes are identified, however evaluation is limited given lack of IV contrast. Three-vessel coronary artery calcification is noted. The heart is mildly enlarged. There is a trace right pleural effusion. There is no pericardial effusion.  Major airways are patent. There is consolidation with air bronchograms involving much of the right lower lobe. Minimal opacity in the left lower lobe likely represents atelectasis. No lytic or blastic osseous lesions are identified.  CT ABDOMEN AND PELVIS FINDINGS  The liver, gallbladder, spleen, adrenal glands, and pancreas have an unremarkable unenhanced appearance. Small hypodensities in the kidneys are most compatible with cysts, measuring up to 1.2 cm on the right and 2.3 cm on the left. There is mild, symmetric perinephric stranding.  Oral contrast is present in distal small bowel and colon to the level of the proximal sigmoid colon without evidence of obstruction. There is mild colonic diverticulosis without evidence of diverticulitis. The prostate is absent. Bladder is unremarkable. Diffuse aortoiliac atherosclerotic calcification is noted.  There is an oval low-density lesion measuring 4.5 x 3.0 cm along the left external iliac chain, new from the prior CT. A similar but smaller lesion along the right external iliac chain is also new, measuring 2.0 x 1.3 cm. No enlarged lymph nodes are identified elsewhere in the abdomen or pelvis. There is no free fluid. There is atrophy of the left psoas muscle. Degenerative changes are again noted in the thoracolumbar spine, hips, and SI joints.  IMPRESSION: 1. Right lower lobe pneumonia. 2. No acute abnormality identified in the abdomen or pelvis. 3. Two new low-density lesions along the external iliac chains, likely small lymphoceles.   Electronically Signed   By: Logan Bores   On: 02/07/2015 19:14   Ct Chest Wo  Contrast  02/07/2015   CLINICAL DATA:  Diarrhea, fever, cough, and worsening shortness of breath for 1 week. Sepsis and acute renal failure. History of prostate cancer.  EXAM: CT CHEST, ABDOMEN AND PELVIS WITHOUT CONTRAST  TECHNIQUE: Multidetector CT imaging of the chest, abdomen and pelvis was performed following the standard protocol without IV contrast.  COMPARISON:  Three-way abdominal series earlier today. CT pelvis 12/15/2013.  FINDINGS: CT CHEST FINDINGS  Mildly enlarged right paratracheal lymph nodes measure up to 11 mm in short axis, most likely reactive. No definite enlarged hilar lymph nodes are identified, however evaluation is limited given lack of IV contrast. Three-vessel coronary artery calcification is noted. The heart is mildly enlarged. There is a trace right pleural effusion. There is no pericardial effusion.  Major airways are patent. There is consolidation with air bronchograms  involving much of the right lower lobe. Minimal opacity in the left lower lobe likely represents atelectasis. No lytic or blastic osseous lesions are identified.  CT ABDOMEN AND PELVIS FINDINGS  The liver, gallbladder, spleen, adrenal glands, and pancreas have an unremarkable unenhanced appearance. Small hypodensities in the kidneys are most compatible with cysts, measuring up to 1.2 cm on the right and 2.3 cm on the left. There is mild, symmetric perinephric stranding.  Oral contrast is present in distal small bowel and colon to the level of the proximal sigmoid colon without evidence of obstruction. There is mild colonic diverticulosis without evidence of diverticulitis. The prostate is absent. Bladder is unremarkable. Diffuse aortoiliac atherosclerotic calcification is noted.  There is an oval low-density lesion measuring 4.5 x 3.0 cm along the left external iliac chain, new from the prior CT. A similar but smaller lesion along the right external iliac chain is also new, measuring 2.0 x 1.3 cm. No enlarged lymph  nodes are identified elsewhere in the abdomen or pelvis. There is no free fluid. There is atrophy of the left psoas muscle. Degenerative changes are again noted in the thoracolumbar spine, hips, and SI joints.  IMPRESSION: 1. Right lower lobe pneumonia. 2. No acute abnormality identified in the abdomen or pelvis. 3. Two new low-density lesions along the external iliac chains, likely small lymphoceles.   Electronically Signed   By: Logan Bores   On: 02/07/2015 19:14   Dg Chest Port 1 View  02/10/2015   CLINICAL DATA:  Respiratory failure, shortness of breath.  EXAM: PORTABLE CHEST - 1 VIEW  COMPARISON:  02/08/2015  FINDINGS: Enlarged cardiac silhouette. Central vascular congestion. Right lower lobe airspace opacity. Linear left lung base opacity, favor atelectasis. Mild increased interstitial markings. Small pleural effusions not excluded. No pneumothorax. Multilevel degenerative changes.  IMPRESSION: Right lung consolidation is most in keeping with pneumonia.  Central vascular congestion. Interstitial edema not excluded.   Electronically Signed   By: Carlos Levering M.D.   On: 02/10/2015 06:23   Dg Chest Port 1 View  02/08/2015   CLINICAL DATA:  Respiratory failure.  EXAM: PORTABLE CHEST - 1 VIEW  COMPARISON:  CT 02/07/2015.  Chest x-ray 02/06/2015.  FINDINGS: Cardiomegaly with pulmonary venous congestion and bilateral infiltrates. Small right pleural effusion. These findings are consistent with congestive heart failure. Underlying pneumonia particularly in the right lower lobe cannot be excluded. No pneumothorax. No acute bony abnormality.  IMPRESSION: 1. Congestive heart failure with bilateral pulmonary edema and small right pleural effusion. 2. Persistent underlying right lower lobe pneumonia cannot be excluded .   Electronically Signed   By: Marcello Moores  Register   On: 02/08/2015 07:19   Dg Chest Port 1 View  02/06/2015   CLINICAL DATA:  Worsening dyspnea and fever. Short of breath today. History of  hypertension.  EXAM: PORTABLE CHEST - 1 VIEW  COMPARISON:  02/05/2015.  FINDINGS: Since prior exam, bilateral interstitial thickening has increased. Mild hazy airspace opacity is noted in the medial right lung base and lateral left lung base. There is no convincing pleural effusion and no pneumothorax.  Cardiac silhouette is mildly enlarged. No mediastinal or hilar masses.  Bony thorax is grossly intact.  IMPRESSION: 1. Increased interstitial thickening when compared the prior study and with mild hazy airspace opacity at the lung bases. Findings could reflect pulmonary edema. However, given the history of fever, diffuse interstitial pneumonia is suspected.   Electronically Signed   By: Lajean Manes M.D.   On: 02/06/2015 09:27  Dg Chest Port 1 View  (if Code Sepsis Called)  02/05/2015   CLINICAL DATA:  Shortness of breath. Dizziness. Shortness of breath for 2 days. Dizziness is today. History of diabetes.  EXAM: PORTABLE CHEST - 1 VIEW  COMPARISON:  12/15/2013  FINDINGS: Cardiac silhouette normal in size configuration. No mediastinal or hilar masses. Lungs are clear. No pleural effusion or pneumothorax.  Bony thorax is intact.  IMPRESSION: No acute cardiopulmonary disease.   Electronically Signed   By: Lajean Manes M.D.   On: 02/05/2015 19:15   Dg Abd Acute W/chest  02/07/2015   CLINICAL DATA:  64 year old male with a history of shortness of breath.  EXAM: DG ABDOMEN ACUTE W/ 1V CHEST  COMPARISON:  Chest x-ray 02/06/2015, CT pelvis 12/15/2013  FINDINGS: Chest:  Cardiomediastinal silhouette unchanged in size and contour with cardiomegaly. Mediastinal border somewhat obscures secondary to the apical lordotic positioning.  Pulmonary vascular congestion.  Linear and patchy opacities at the bilateral lung bases with obscuration of the retrocardiac region. No pneumothorax.  Decubitus chest image demonstrates no layering pleural effusion along the right mediastinum.  Abdomen:  Small bowel loops and colon filled  with gas. Borderline dilation of central small bowel loops. Body habitus limits evaluation the abdomen.  Decubitus image limited by respiratory motion, though there are air-fluid levels present.  No displaced fracture.  IMPRESSION: Chest:  Linear and patchy opacities at the lung bases may reflect atelectasis, edema, and/or consolidation. No layering pleural effusion.  Abdomen:  Limited abdominal plain film demonstrates nonspecific bowel gas pattern with borderline dilated small bowel and several air-fluid levels. If there is concern for acute intra-abdominal process, CT would be recommended.  Signed,  Dulcy Fanny. Earleen Newport, DO  [facility]  [facility]   Electronically Signed   By: Corrie Mckusick D.O.   On: 02/07/2015 11:51   US Abdomen Limited Ruq  02/08/2015   CLINICAL DATA:  Initial encounter for elevated LFTs with hyperbilirubinemia  EXAM: US ABDOMEN LIMITED - RIGHT UPPER QUADRANT  COMPARISON:  CT scan 02/07/2015  FINDINGS: Gallbladder:  No gallstones or wall thickening visualized. No sonographic Murphy sign noted.  Common bile duct:  Diameter: Nondilated at 3 mm diameter.  Liver:  No focal lesion identified. Within normal limits in parenchymal echogenicity.  Other: Incidental imaging of the right kidney shows increased echogenicity. 14 mm hypoechoic structure near the upper pole is likely a cyst.  IMPRESSION: No evidence for biliary dilatation.  No gallstones.   Electronically Signed   By: Misty Stanley M.D.   On: 02/08/2015 15:24    ASSESSMENT AND PLAN 1.  Nonsustained ventricular tachycardia, asymptomatic.  No recurrence. Plan: Continue higher dose of beta blocker 100 mg twice a day.    2.  Coronary artery disease.  Overlapping LAD stents placed in 2013.  Continue Plavix  3.  Essential hypertension  4.  Right lower lobe pneumonia  Plan: probable discharge today. Cardiology followup with Dr. Irish Lack.   Signed, Darlin Coco MD

## 2015-02-13 NOTE — Progress Notes (Addendum)
Pt VSS. Pt d/c tele. No IV access.  Reviewed d/c instructions at bedside, no further questions. Printed prescriptions given to pt at bedside. Kizzie Ide, RN

## 2015-02-22 ENCOUNTER — Other Ambulatory Visit: Payer: Self-pay | Admitting: Internal Medicine

## 2015-02-22 ENCOUNTER — Ambulatory Visit
Admission: RE | Admit: 2015-02-22 | Discharge: 2015-02-22 | Disposition: A | Discharge status: Home / Self Care | Payer: 59 | Source: Ambulatory Visit | Attending: Internal Medicine | Admitting: Internal Medicine

## 2015-02-22 DIAGNOSIS — J189 Pneumonia, unspecified organism: Secondary | ICD-10-CM

## 2015-03-11 ENCOUNTER — Ambulatory Visit (INDEPENDENT_AMBULATORY_CARE_PROVIDER_SITE_OTHER): Payer: 59 | Admitting: Interventional Cardiology

## 2015-03-11 ENCOUNTER — Encounter: Payer: Self-pay | Admitting: Interventional Cardiology

## 2015-03-11 VITALS — BP 118/68 | HR 76 | Ht 67.0 in | Wt 225.0 lb

## 2015-03-11 DIAGNOSIS — E785 Hyperlipidemia, unspecified: Secondary | ICD-10-CM | POA: Diagnosis not present

## 2015-03-11 DIAGNOSIS — N183 Chronic kidney disease, stage 3 unspecified: Secondary | ICD-10-CM

## 2015-03-11 DIAGNOSIS — I2510 Atherosclerotic heart disease of native coronary artery without angina pectoris: Secondary | ICD-10-CM

## 2015-03-11 DIAGNOSIS — I4729 Other ventricular tachycardia: Secondary | ICD-10-CM

## 2015-03-11 DIAGNOSIS — I10 Essential (primary) hypertension: Secondary | ICD-10-CM | POA: Diagnosis not present

## 2015-03-11 DIAGNOSIS — I472 Ventricular tachycardia: Secondary | ICD-10-CM

## 2015-03-11 NOTE — Progress Notes (Signed)
Patient ID: Gregory [name], male   DOB: Jul 19, 1951, 64 y.o.   MRN: [id number]     Cardiology Office Note   Date:  03/11/2015   ID:  Gregory [name], DOB Jan 26, 1951, MRN [id number]  PCP:  Kandice Hams, MD    No chief complaint on file. CAD   Wt Readings from Last 3 Encounters:  03/11/15 225 lb (102.059 kg)  02/11/15 216 lb 12.8 oz (98.34 kg)  09/18/14 219 lb 9.6 oz (99.61 kg)       History of Present Illness: Gregory [name] is a 64 y.o. male  who had overlapping stents to the LAD, DES in 8/13. Not exercising; now his job is as a Art gallery manager.  CAD/ASCVD:  Denies : Chest pain.  Diaphoresis.  Dizziness.  Dyspnea on exertion.  Fatigue.  Leg edema.  Nitroglycerin.  Orthopnea.  Palpitations.  Shortness of breath.  Syncope.   He was hoispitalized in 4/16 for  Pneumonia.  He did have a cardiology consult due to a prolonged run of nonsustained ventricular tachycardia which occurred early in the morning. The patient was asymptomatic. He was watched on telemetry and beta blockade was increased. He did not have any further episodes on telemetry. There is no syncope. He was sent home and he has felt well. He denies any ischemic symptoms. He denies any palpitations or syncope.  Past Medical History  Diagnosis Date  . Hypertension   . Hyperlipidemia   . Coronary artery disease   . Swelling of left lower extremity     LLE; "happens often"  . Myocardial infarction 05/2012    "mild"  . Type II diabetes mellitus   . Gout   . Arthritis   . Cancer     Past Surgical History  Procedure Laterality Date  . Coronary angioplasty with stent placement  06/05/2012    "2; total of 2"  . Back surgery    . Lumbar disc surgery  1990's  . Robot assisted laparoscopic radical prostatectomy N/A 02/25/2014    Procedure: ROBOTIC ASSISTED LAPAROSCOPIC RADICAL  PROSTATECTOMY,  UMBILICAL HERNIA REPAIR;  Surgeon: Molli Hazard, MD;  Location: [facility] ORS;  Service: Urology;  Laterality: N/A;  .  Lymphadenectomy Bilateral 02/25/2014    Procedure: [facility] LYMPH NODE DISSECTION";  Surgeon: Molli Hazard, MD;  Location: [facility] ORS;  Service: Urology;  Laterality: Bilateral;  . Prostate surgery    . Left heart catheterization with coronary angiogram N/A 06/05/2012    Procedure: LEFT HEART CATHETERIZATION WITH CORONARY ANGIOGRAM;  Surgeon: Jettie Booze, MD;  Location: [facility] CATH LAB;  Service: Cardiovascular;  Laterality: N/A;  possible PCI     Current Outpatient Prescriptions  Medication Sig Dispense Refill  . allopurinol (ZYLOPRIM) 100 MG tablet Take 300 mg by mouth daily as needed (gout).     Marland Kitchen amLODipine-valsartan (EXFORGE) 10-320 MG per tablet Take 1 tablet by mouth daily.    . clopidogrel (PLAVIX) 75 MG tablet TAKE 1 TABLET (75 MG TOTAL) BY MOUTH DAILY. 90 tablet 0  . insulin glargine (LANTUS) 100 UNIT/ML injection Inject 25 Units into the skin every morning.     Marland Kitchen levofloxacin (LEVAQUIN) 500 MG tablet Take 1 tablet (500 mg total) by mouth daily. 1 tablet 0  . metFORMIN (GLUCOPHAGE) 1000 MG tablet Take 1,000 mg by mouth 2 (two) times daily with a meal.    . metoprolol (LOPRESSOR) 100 MG tablet Take 1 tablet (100 mg total) by mouth 2 (two) times daily. 60 tablet 3  . nitroGLYCERIN (  NITROSTAT) 0.4 MG SL tablet Place 0.4 mg under the tongue every 5 (five) minutes as needed for chest pain.     No current facility-administered medications for this visit.    Allergies:   Review of patient's allergies indicates no known allergies.    Social History:  The patient  reports that he quit smoking about 21 years ago. His smoking use included Cigarettes. He has a 60 pack-year smoking history. He has never used smokeless tobacco. He reports that he does not drink alcohol or use illicit drugs.   Family History:  The patient's *family history includes Asthma in his father; Breast cancer in his mother; Parkinson's disease in his father.    ROS:  Please see the history of  present illness.   Otherwise, review of systems are positive for recent pneumonia.   All other systems are reviewed and negative.    PHYSICAL EXAM: VS:  BP 118/68 mmHg  Pulse 76  Ht 5\' 7"  (1.702 m)  Wt 225 lb (102.059 kg)  BMI 35.23 kg/m2  SpO2 95% , BMI Body mass index is 35.23 kg/(m^2). GEN: Well nourished, well developed, in no acute distress HEENT: normal Neck: no JVD, carotid bruits, or masses Cardiac: *RRR; no murmurs, rubs, or gallops,  Respiratory:  clear to auscultation bilaterally, normal work of breathing GI: soft, nontender, nondistended, + BS MS: no deformity or atrophy, left leg swelling Skin: warm and dry, no rash Neuro:  Strength and sensation are intact Psych: euthymic mood, full affect   EKG:   The ekg ordered while in the hopital showed sinus tach   Recent Labs: 02/06/2015: B Natriuretic Peptide 91.2; TSH 1.297 02/11/2015: Hemoglobin 12.2*; Magnesium 2.5; Platelets 356 02/12/2015: ALT 69* 02/13/2015: BUN 31*; Creatinine 1.66*; Potassium 4.1; Sodium 143   Lipid Panel No results found for: CHOL, TRIG, HDL, CHOLHDL, VLDL, LDLCALC, LDLDIRECT   Other studies Reviewed: Additional studies/ records that were reviewed today with results demonstrating: Hospital records showing nonsustained ventricular tachycardia in the hospital..   ASSESSMENT AND PLAN:  1. Coronary artery disease: Overlapping drug-eluting stents in his LAD. He has some distal LAD disease which is managed medically. Indigestion was his primary anginal symptom previously.  2. Ventricular tachycardia: This was noted in the hospital. He has not had any lightheadedness or syncope. Continue beta blocker. This was likely related to his severe pneumonia causing stress on his heart. 3. Hypertension: Continue current antihypertensives. His blood pressure is well controlled. We'll have to confirm what he is actually taking and the doses that he is taking. Medication choices may be limited due to his chronic  renal insufficiency. 4. Hyperlipidemia: He has been on atorvastatin in the past. It is not showing up on his list at this time. Will follow-up and get his lab work from his primary care doctor to make sure that his LDL is at target in to see if there is a reason why and if his statin was stopped. 5. Continue to try to lose weight through diet control and regular exercise.   Current medicines are reviewed at length with the patient today.  The patient concerns regarding his medicines were addressed.  The following changes have been made:  No change.  He will call us back to confirm his medications.  Labs/ tests ordered today include: We'll obtain results from his recent labs with his primary care doctor.  No orders of the defined types were placed in this encounter.    Recommend 150 minutes/week of aerobic exercise Low fat,  low carb, high fiber diet recommended  Disposition:   FU in 6 months   Teresita Madura., MD  03/11/2015 4:45 PM    [facility] [place], [place], [place]  [address] Phone: [phone]; Fax: [phone]

## 2015-03-11 NOTE — Patient Instructions (Signed)
**Note De-Identified Charnetta Wulff Obfuscation** Medication Instructions:  Same. Please call the office tomorrow at [phone] to give [place] an updated list of your medications  Labwork: None  Testing/Procedures: None  Follow-Up: Your physician wants you to follow-up in: 6 months. You will receive a reminder letter in the mail two months in advance. If you don't receive a letter, please call our office to schedule the follow-up appointment.

## 2015-07-12 DIAGNOSIS — Z794 Long term (current) use of insulin: Secondary | ICD-10-CM | POA: Diagnosis not present

## 2015-07-12 DIAGNOSIS — E1122 Type 2 diabetes mellitus with diabetic chronic kidney disease: Secondary | ICD-10-CM | POA: Diagnosis not present

## 2015-07-12 DIAGNOSIS — E782 Mixed hyperlipidemia: Secondary | ICD-10-CM | POA: Diagnosis not present

## 2015-07-12 DIAGNOSIS — N182 Chronic kidney disease, stage 2 (mild): Secondary | ICD-10-CM | POA: Diagnosis not present

## 2015-07-12 DIAGNOSIS — I10 Essential (primary) hypertension: Secondary | ICD-10-CM | POA: Diagnosis not present

## 2015-07-12 DIAGNOSIS — I2510 Atherosclerotic heart disease of native coronary artery without angina pectoris: Secondary | ICD-10-CM | POA: Diagnosis not present

## 2015-07-12 DIAGNOSIS — C61 Malignant neoplasm of prostate: Secondary | ICD-10-CM | POA: Diagnosis not present

## 2015-08-23 DIAGNOSIS — Z23 Encounter for immunization: Secondary | ICD-10-CM | POA: Diagnosis not present

## 2015-08-23 DIAGNOSIS — R109 Unspecified abdominal pain: Secondary | ICD-10-CM | POA: Diagnosis not present

## 2015-09-20 ENCOUNTER — Ambulatory Visit (INDEPENDENT_AMBULATORY_CARE_PROVIDER_SITE_OTHER): Payer: Medicare Other | Admitting: Interventional Cardiology

## 2015-09-20 ENCOUNTER — Encounter: Payer: Self-pay | Admitting: Interventional Cardiology

## 2015-09-20 VITALS — BP 118/50 | HR 76 | Ht 67.0 in | Wt 230.0 lb

## 2015-09-20 DIAGNOSIS — I2510 Atherosclerotic heart disease of native coronary artery without angina pectoris: Secondary | ICD-10-CM | POA: Diagnosis not present

## 2015-09-20 DIAGNOSIS — I10 Essential (primary) hypertension: Secondary | ICD-10-CM

## 2015-09-20 DIAGNOSIS — E669 Obesity, unspecified: Secondary | ICD-10-CM

## 2015-09-20 DIAGNOSIS — E1159 Type 2 diabetes mellitus with other circulatory complications: Secondary | ICD-10-CM

## 2015-09-20 DIAGNOSIS — E785 Hyperlipidemia, unspecified: Secondary | ICD-10-CM | POA: Diagnosis not present

## 2015-09-20 NOTE — Patient Instructions (Signed)

## 2015-09-20 NOTE — Progress Notes (Signed)
Patient ID: Gregory [name], male   DOB: August 18, 1951, 64 y.o.   MRN: [id number]     Cardiology Office Note   Date:  09/20/2015   ID:  Gregory [name], DOB 18-Dec-1950, MRN [id number]  PCP:  Kandice Hams, MD    No chief complaint on file.  F/u CAD  Wt Readings from Last 3 Encounters:  09/20/15 230 lb (104.327 kg)  03/11/15 225 lb (102.059 kg)  02/11/15 216 lb 12.8 oz (98.34 kg)       History of Present Illness: Gregory [name] is a 64 y.o. male  who had overlapping stents to the LAD, DES in 8/13. Not exercising; now his job is as a Art gallery manager.  CAD/ASCVD:  Denies : Chest pain.  Diaphoresis.  Dizziness.  Dyspnea on exertion.  Fatigue.  Leg edema.  Nitroglycerin.  Orthopnea.  Palpitations.  Shortness of breath.  Syncope.   He was hoispitalized in 4/16 for Pneumonia. He did have a cardiology consult due to a prolonged run of nonsustained ventricular tachycardia which occurred early in the morning. The patient was asymptomatic. He was watched on telemetry and beta blockade was increased. He did not have any further episodes on telemetry. There is no syncope. He was sent home and he has felt well. He denies any ischemic symptoms. He denies any palpitations or syncope.  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  He walks a lot on that job without difficulty.  He has gained weight since his last visit.  He does not think his eating habits are different.  DM control level unknown to him at this time.  Followed by Dr. Delfina Redwood.    Past Medical History  Diagnosis Date  . Hypertension   . Hyperlipidemia   . Coronary artery disease   . Swelling of left lower extremity     LLE; "happens often"  . Myocardial infarction ([place]) 05/2012    "mild"  . Type II diabetes mellitus ([place])   . Gout   . Arthritis   . Cancer [facility])     Past Surgical History  Procedure Laterality Date  . Coronary angioplasty with stent placement   06/05/2012    "2; total of 2"  . Back surgery    . Lumbar disc surgery  1990's  . Robot assisted laparoscopic radical prostatectomy N/A 02/25/2014    Procedure: ROBOTIC ASSISTED LAPAROSCOPIC RADICAL  PROSTATECTOMY,  UMBILICAL HERNIA REPAIR;  Surgeon: Molli Hazard, MD;  Location: [facility];  Service: Urology;  Laterality: N/A;  . Lymphadenectomy Bilateral 02/25/2014    Procedure: [facility] LYMPH NODE DISSECTION";  Surgeon: Molli Hazard, MD;  Location: [facility];  Service: Urology;  Laterality: Bilateral;  . Prostate surgery    . Left heart catheterization with coronary angiogram N/A 06/05/2012    Procedure: LEFT HEART CATHETERIZATION WITH CORONARY ANGIOGRAM;  Surgeon: Jettie Booze, MD;  Location: [facility] CATH LAB;  Service: Cardiovascular;  Laterality: N/A;  possible PCI     Current Outpatient Prescriptions  Medication Sig Dispense Refill  . allopurinol (ZYLOPRIM) 100 MG tablet Take 300 mg by mouth daily as needed (gout).     Marland Kitchen amLODipine-valsartan (EXFORGE) 10-320 MG per tablet Take 1 tablet by mouth daily.    Marland Kitchen atorvastatin (LIPITOR) 10 MG tablet Take 10 mg by mouth daily at 6 PM.   1  . clopidogrel (PLAVIX) 75 MG tablet TAKE 1 TABLET (75 MG TOTAL) BY MOUTH DAILY. 90 tablet 0  .  insulin glargine (LANTUS) 100 UNIT/ML injection Inject 25 Units into the skin every morning.     . metFORMIN (GLUCOPHAGE) 1000 MG tablet Take 1,000 mg by mouth 2 (two) times daily with a meal.    . metoprolol (LOPRESSOR) 100 MG tablet Take 1 tablet (100 mg total) by mouth 2 (two) times daily. 60 tablet 3  . nitroGLYCERIN (NITROSTAT) 0.4 MG SL tablet Place 0.4 mg under the tongue every 5 (five) minutes as needed for chest pain.     No current facility-administered medications for this visit.    Allergies:   Review of patient's allergies indicates no known allergies.    Social History:  The patient  reports that he quit smoking about 21 years ago. His smoking use included Cigarettes. He has  a 60 pack-year smoking history. He has never used smokeless tobacco. He reports that he does not drink alcohol or use illicit drugs.   Family History:  The patient's family history includes Asthma in his father; Breast cancer in his mother; Parkinson's disease in his father.    ROS:  Please see the history of present illness.   Otherwise, review of systems are positive for chronic left ankle edema- torn achilles tendon.   All other systems are reviewed and negative.    PHYSICAL EXAM: VS:  BP 118/50 mmHg  Pulse 76  Ht 5\' 7"  (1.702 m)  Wt 230 lb (104.327 kg)  BMI 36.01 kg/m2  SpO2 94% , BMI Body mass index is 36.01 kg/(m^2). GEN: Well nourished, well developed, in no acute distress HEENT: normal Neck: no JVD, carotid bruits, or masses Cardiac: RRR; no murmurs, rubs, or gallops,; left ankle edema  Respiratory:  clear to auscultation bilaterally, normal work of breathing GI: soft, nontender, nondistended, + BS MS: no deformity or atrophy Skin: warm and dry, no rash Neuro:  Strength and sensation are intact Psych: euthymic mood, full affect      Recent Labs: 02/06/2015: B Natriuretic Peptide 91.2; TSH 1.297 02/11/2015: Hemoglobin 12.2*; Magnesium 2.5; Platelets 356 02/12/2015: ALT 69* 02/13/2015: BUN 31*; Creatinine, Ser 1.66*; Potassium 4.1; Sodium 143   Lipid Panel No results found for: CHOL, TRIG, HDL, CHOLHDL, VLDL, LDLCALC, LDLDIRECT   Other studies Reviewed: Additional studies/ records that were reviewed today with results demonstrating: PCI in 2013.   ASSESSMENT AND PLAN:  1. Coronary artery disease: Overlapping drug-eluting stents in his LAD in 2013. He has some distal LAD disease which is managed medically. Indigestion was his primary anginal symptom previously.  2. Ventricular tachycardia: This was noted in the hospital. He has not had any lightheadedness or syncope. Continue beta blocker. This was likely related to his severe pneumonia causing stress on his  heart. 3. Hyperlipidemia: Back on atorvastatin. It is back on his list at this time.  4. Hypertension: Continue current antihypertensives. His blood pressure is well controlled.  Medication choices may be limited due to his chronic renal insufficiency. 5. DM: Followed by PMD. 6. Obesity: stressed importance of diet and exercise to help lose weight.   Current medicines are reviewed at length with the patient today.  The patient concerns regarding his medicines were addressed.  The following changes have been made:  No change  Labs/ tests ordered today include:  No orders of the defined types were placed in this encounter.    Recommend 150 minutes/week of aerobic exercise Low fat, low carb, high fiber diet recommended  Disposition:   FU in 1 year   Teresita Madura., MD  09/20/2015  8:54 AM    [facility] [place], [place], [place]  [address] Phone: [phone]; Fax: [phone]

## 2015-10-11 DIAGNOSIS — N393 Stress incontinence (female) (male): Secondary | ICD-10-CM | POA: Diagnosis not present

## 2015-10-11 DIAGNOSIS — C61 Malignant neoplasm of prostate: Secondary | ICD-10-CM | POA: Diagnosis not present

## 2015-10-11 DIAGNOSIS — N5201 Erectile dysfunction due to arterial insufficiency: Secondary | ICD-10-CM | POA: Diagnosis not present

## 2015-11-05 MED FILL — CLOPIDOGREL 75 MG TABLET: 75 | 30 days supply | Qty: 30 | Fill #2

## 2015-11-05 MED FILL — ALLOPURINOL 100 MG TABLET: 100 | 30 days supply | Qty: 90 | Fill #6

## 2015-11-05 MED FILL — METOPROLOL TARTRATE 100 MG: 100 | 30 days supply | Qty: 60 | Fill #3

## 2015-11-08 DIAGNOSIS — I10 Essential (primary) hypertension: Secondary | ICD-10-CM | POA: Diagnosis not present

## 2015-11-08 DIAGNOSIS — N182 Chronic kidney disease, stage 2 (mild): Secondary | ICD-10-CM | POA: Diagnosis not present

## 2015-11-08 DIAGNOSIS — M1000 Idiopathic gout, unspecified site: Secondary | ICD-10-CM | POA: Diagnosis not present

## 2015-11-08 DIAGNOSIS — Z7984 Long term (current) use of oral hypoglycemic drugs: Secondary | ICD-10-CM | POA: Diagnosis not present

## 2015-11-08 DIAGNOSIS — E663 Overweight: Secondary | ICD-10-CM | POA: Diagnosis not present

## 2015-11-08 DIAGNOSIS — E782 Mixed hyperlipidemia: Secondary | ICD-10-CM | POA: Diagnosis not present

## 2015-11-08 DIAGNOSIS — Z0000 Encounter for general adult medical examination without abnormal findings: Secondary | ICD-10-CM | POA: Diagnosis not present

## 2015-11-08 DIAGNOSIS — E119 Type 2 diabetes mellitus without complications: Secondary | ICD-10-CM | POA: Diagnosis not present

## 2015-11-08 DIAGNOSIS — Z6834 Body mass index (BMI) 34.0-34.9, adult: Secondary | ICD-10-CM | POA: Diagnosis not present

## 2015-11-08 DIAGNOSIS — E1122 Type 2 diabetes mellitus with diabetic chronic kidney disease: Secondary | ICD-10-CM | POA: Diagnosis not present

## 2015-11-08 DIAGNOSIS — I2510 Atherosclerotic heart disease of native coronary artery without angina pectoris: Secondary | ICD-10-CM | POA: Diagnosis not present

## 2015-11-08 DIAGNOSIS — C61 Malignant neoplasm of prostate: Secondary | ICD-10-CM | POA: Diagnosis not present

## 2015-11-18 MED FILL — LANTUS SOLOSTAR 100 UNITS/M: 100 | 60 days supply | Qty: 15 | Fill #1

## 2015-11-24 MED FILL — metFORMIN HCL 1000 MG TABS: 1000 | 30 days supply | Qty: 60 | Fill #6

## 2015-12-06 MED FILL — AMLODIPINE-VALSARTAN 10-320: 10-320 | 90 days supply | Qty: 90 | Fill #1

## 2015-12-06 MED FILL — METOPROLOL TARTRATE 100 MG: 100 | 30 days supply | Qty: 60 | Fill #4

## 2015-12-06 MED FILL — CLOPIDOGREL 75 MG TABLET: 75 | 30 days supply | Qty: 30 | Fill #3

## 2015-12-06 MED FILL — ALLOPURINOL 100 MG TABLET: 100 | 30 days supply | Qty: 90 | Fill #0

## 2016-01-03 MED FILL — ATORVASTATIN 10 MG TABLET: 10 | 90 days supply | Qty: 90 | Fill #0

## 2016-01-07 MED FILL — CLOPIDOGREL 75 MG TABLET: 75 | 30 days supply | Qty: 30 | Fill #4

## 2016-01-07 MED FILL — ALLOPURINOL 100 MG TABLET: 100 | 30 days supply | Qty: 90 | Fill #1

## 2016-01-07 MED FILL — METOPROLOL TARTRATE 100 MG: 100 | 30 days supply | Qty: 60 | Fill #5

## 2016-01-10 DIAGNOSIS — R351 Nocturia: Secondary | ICD-10-CM | POA: Diagnosis not present

## 2016-01-10 DIAGNOSIS — N3944 Nocturnal enuresis: Secondary | ICD-10-CM | POA: Diagnosis not present

## 2016-01-10 DIAGNOSIS — N3946 Mixed incontinence: Secondary | ICD-10-CM | POA: Diagnosis not present

## 2016-01-10 DIAGNOSIS — Z0000 Encounter for general adult medical examination without abnormal findings: Secondary | ICD-10-CM | POA: Diagnosis not present

## 2016-01-10 MED FILL — metFORMIN HCL 1000 MG TABS: 1000 | 30 days supply | Qty: 60 | Fill #0

## 2016-01-26 MED FILL — LANTUS SOLOSTAR 100 UNITS/M: 100 | 60 days supply | Qty: 15 | Fill #2

## 2016-02-10 MED FILL — ALLOPURINOL 100 MG TABLET: 100 | 30 days supply | Qty: 90 | Fill #2

## 2016-02-10 MED FILL — metFORMIN HCL 1000 MG TABS: 1000 | 30 days supply | Qty: 60 | Fill #1

## 2016-02-10 MED FILL — METOPROLOL TARTRATE 100 MG: 100 | 30 days supply | Qty: 60 | Fill #6

## 2016-02-10 MED FILL — CLOPIDOGREL 75 MG TABLET: 75 | 30 days supply | Qty: 30 | Fill #0

## 2016-02-29 DIAGNOSIS — M25511 Pain in right shoulder: Secondary | ICD-10-CM | POA: Diagnosis not present

## 2016-02-29 MED FILL — HYDROCODON-APAP 5-325: 5-325 | 7 days supply | Qty: 28 | Fill #0

## 2016-03-01 DIAGNOSIS — M25511 Pain in right shoulder: Secondary | ICD-10-CM | POA: Diagnosis not present

## 2016-03-13 DIAGNOSIS — N3944 Nocturnal enuresis: Secondary | ICD-10-CM | POA: Diagnosis not present

## 2016-03-13 DIAGNOSIS — R351 Nocturia: Secondary | ICD-10-CM | POA: Diagnosis not present

## 2016-03-13 DIAGNOSIS — Z0000 Encounter for general adult medical examination without abnormal findings: Secondary | ICD-10-CM | POA: Diagnosis not present

## 2016-03-13 DIAGNOSIS — N3946 Mixed incontinence: Secondary | ICD-10-CM | POA: Diagnosis not present

## 2016-03-13 DIAGNOSIS — R350 Frequency of micturition: Secondary | ICD-10-CM | POA: Diagnosis not present

## 2016-03-21 MED FILL — metFORMIN HCL 1000 MG TABS: 1000 | 30 days supply | Qty: 60 | Fill #2

## 2016-03-21 MED FILL — ALLOPURINOL 100 MG TABLET: 100 | 30 days supply | Qty: 90 | Fill #3

## 2016-03-21 MED FILL — AMLODIPINE-VALSARTAN 10-320: 10-320 | 90 days supply | Qty: 90 | Fill #2

## 2016-03-21 MED FILL — CLOPIDOGREL 75 MG TABLET: 75 | 30 days supply | Qty: 30 | Fill #1

## 2016-03-21 MED FILL — LANTUS SOLOSTAR 100 UNITS/M: 100 | 60 days supply | Qty: 15 | Fill #3

## 2016-03-21 MED FILL — ATORVASTATIN 10 MG TABLET: 10 | 90 days supply | Qty: 90 | Fill #1

## 2016-03-22 MED FILL — METOPROLOL TARTRATE 100 MG: 100 | 30 days supply | Qty: 60 | Fill #0

## 2016-03-27 DIAGNOSIS — N393 Stress incontinence (female) (male): Secondary | ICD-10-CM | POA: Diagnosis not present

## 2016-03-27 DIAGNOSIS — R350 Frequency of micturition: Secondary | ICD-10-CM | POA: Diagnosis not present

## 2016-04-06 DIAGNOSIS — C61 Malignant neoplasm of prostate: Secondary | ICD-10-CM | POA: Diagnosis not present

## 2016-04-10 DIAGNOSIS — N5201 Erectile dysfunction due to arterial insufficiency: Secondary | ICD-10-CM | POA: Diagnosis not present

## 2016-04-10 DIAGNOSIS — C61 Malignant neoplasm of prostate: Secondary | ICD-10-CM | POA: Diagnosis not present

## 2016-04-10 DIAGNOSIS — R252 Cramp and spasm: Secondary | ICD-10-CM | POA: Diagnosis not present

## 2016-04-10 DIAGNOSIS — N393 Stress incontinence (female) (male): Secondary | ICD-10-CM | POA: Diagnosis not present

## 2016-04-28 MED FILL — CLOPIDOGREL 75 MG TABLET: 75 | 30 days supply | Qty: 30 | Fill #2

## 2016-04-28 MED FILL — ALLOPURINOL 100 MG TABLET: 100 | 30 days supply | Qty: 90 | Fill #4

## 2016-04-28 MED FILL — metFORMIN HCL 1000 MG TABS: 1000 | 30 days supply | Qty: 60 | Fill #3

## 2016-04-28 MED FILL — METOPROLOL TARTRATE 100 MG: 100 | 30 days supply | Qty: 60 | Fill #1

## 2016-05-01 DIAGNOSIS — E7800 Pure hypercholesterolemia, unspecified: Secondary | ICD-10-CM | POA: Diagnosis not present

## 2016-05-01 DIAGNOSIS — E1122 Type 2 diabetes mellitus with diabetic chronic kidney disease: Secondary | ICD-10-CM | POA: Diagnosis not present

## 2016-05-01 DIAGNOSIS — Z794 Long term (current) use of insulin: Secondary | ICD-10-CM | POA: Diagnosis not present

## 2016-05-01 DIAGNOSIS — M1000 Idiopathic gout, unspecified site: Secondary | ICD-10-CM | POA: Diagnosis not present

## 2016-05-01 DIAGNOSIS — C61 Malignant neoplasm of prostate: Secondary | ICD-10-CM | POA: Diagnosis not present

## 2016-05-01 DIAGNOSIS — I2510 Atherosclerotic heart disease of native coronary artery without angina pectoris: Secondary | ICD-10-CM | POA: Diagnosis not present

## 2016-05-01 DIAGNOSIS — I10 Essential (primary) hypertension: Secondary | ICD-10-CM | POA: Diagnosis not present

## 2016-05-26 MED FILL — LANTUS SOLOSTAR 100 UNITS/M: 100 | 60 days supply | Qty: 15 | Fill #4

## 2016-05-30 MED FILL — METOPROLOL TARTRATE 100 MG: 100 | 30 days supply | Qty: 60 | Fill #2

## 2016-05-30 MED FILL — metFORMIN HCL 1000 MG TABS: 1000 | 30 days supply | Qty: 60 | Fill #4

## 2016-05-30 MED FILL — ALLOPURINOL 100 MG TABLET: 100 | 30 days supply | Qty: 90 | Fill #5

## 2016-05-30 MED FILL — CLOPIDOGREL 75 MG TABLET: 75 | 30 days supply | Qty: 30 | Fill #3

## 2016-07-10 MED FILL — METOPROLOL TARTRATE 100 MG: 100 | 30 days supply | Qty: 60 | Fill #3

## 2016-07-10 MED FILL — CLOPIDOGREL 75 MG TABLET: 75 | 30 days supply | Qty: 30 | Fill #4

## 2016-07-10 MED FILL — metFORMIN HCL 1000 MG TABS: 1000 | 30 days supply | Qty: 60 | Fill #5

## 2016-07-10 MED FILL — AMLODIPINE-VALSARTAN 10-320: 10-320 | 90 days supply | Qty: 90 | Fill #3

## 2016-07-10 MED FILL — ALLOPURINOL 100 MG TABLET: 100 | 30 days supply | Qty: 90 | Fill #0

## 2016-07-10 MED FILL — ATORVASTATIN 10 MG TABLET: 10 | 90 days supply | Qty: 90 | Fill #2

## 2016-08-11 MED FILL — LANTUS SOLOSTAR 100 UNITS/M: 100 | 60 days supply | Qty: 15 | Fill #5

## 2016-08-23 MED FILL — CLOPIDOGREL 75 MG TABLET: 75 | 30 days supply | Qty: 30 | Fill #5

## 2016-08-23 MED FILL — metFORMIN HCL 1000 MG TABS: 1000 | 30 days supply | Qty: 60 | Fill #6

## 2016-08-23 MED FILL — ALLOPURINOL 100 MG TABLET: 100 | 30 days supply | Qty: 90 | Fill #1

## 2016-08-23 MED FILL — METOPROLOL TARTRATE 100 MG: 100 | 30 days supply | Qty: 60 | Fill #4

## 2016-09-11 DIAGNOSIS — N182 Chronic kidney disease, stage 2 (mild): Secondary | ICD-10-CM | POA: Diagnosis not present

## 2016-09-11 DIAGNOSIS — E7800 Pure hypercholesterolemia, unspecified: Secondary | ICD-10-CM | POA: Diagnosis not present

## 2016-09-11 DIAGNOSIS — E663 Overweight: Secondary | ICD-10-CM | POA: Diagnosis not present

## 2016-09-11 DIAGNOSIS — Z6836 Body mass index (BMI) 36.0-36.9, adult: Secondary | ICD-10-CM | POA: Diagnosis not present

## 2016-09-11 DIAGNOSIS — I2510 Atherosclerotic heart disease of native coronary artery without angina pectoris: Secondary | ICD-10-CM | POA: Diagnosis not present

## 2016-09-11 DIAGNOSIS — E1122 Type 2 diabetes mellitus with diabetic chronic kidney disease: Secondary | ICD-10-CM | POA: Diagnosis not present

## 2016-09-11 DIAGNOSIS — Z7984 Long term (current) use of oral hypoglycemic drugs: Secondary | ICD-10-CM | POA: Diagnosis not present

## 2016-10-06 MED FILL — CLOPIDOGREL 75 MG TABLET: 75 | 30 days supply | Qty: 30 | Fill #6

## 2016-10-06 MED FILL — ALLOPURINOL 100 MG TABLET: 100 | 30 days supply | Qty: 90 | Fill #2

## 2016-10-18 MED FILL — LANTUS SOLOSTAR 100 UNITS/M: 100 | 60 days supply | Qty: 15 | Fill #0

## 2016-11-13 MED FILL — ALLOPURINOL 100 MG TABLET: 100 | 30 days supply | Qty: 90 | Fill #3

## 2016-11-13 MED FILL — CLOPIDOGREL 75 MG TABLET: 75 | 30 days supply | Qty: 30 | Fill #7

## 2016-11-13 MED FILL — metFORMIN HCL 1000 MG TABS: 1000 | 30 days supply | Qty: 60 | Fill #7

## 2016-11-14 MED FILL — AMLODIPINE-VALSARTAN 10-320: 10-320 | 90 days supply | Qty: 90 | Fill #0

## 2016-11-20 DIAGNOSIS — E1165 Type 2 diabetes mellitus with hyperglycemia: Secondary | ICD-10-CM | POA: Diagnosis not present

## 2016-11-20 DIAGNOSIS — N182 Chronic kidney disease, stage 2 (mild): Secondary | ICD-10-CM | POA: Diagnosis not present

## 2016-11-20 DIAGNOSIS — C61 Malignant neoplasm of prostate: Secondary | ICD-10-CM | POA: Diagnosis not present

## 2016-11-20 DIAGNOSIS — Z7984 Long term (current) use of oral hypoglycemic drugs: Secondary | ICD-10-CM | POA: Diagnosis not present

## 2016-11-20 DIAGNOSIS — Z6836 Body mass index (BMI) 36.0-36.9, adult: Secondary | ICD-10-CM | POA: Diagnosis not present

## 2016-11-20 DIAGNOSIS — E7800 Pure hypercholesterolemia, unspecified: Secondary | ICD-10-CM | POA: Diagnosis not present

## 2016-11-20 DIAGNOSIS — I2510 Atherosclerotic heart disease of native coronary artery without angina pectoris: Secondary | ICD-10-CM | POA: Diagnosis not present

## 2016-11-20 DIAGNOSIS — E1122 Type 2 diabetes mellitus with diabetic chronic kidney disease: Secondary | ICD-10-CM | POA: Diagnosis not present

## 2016-11-20 DIAGNOSIS — I10 Essential (primary) hypertension: Secondary | ICD-10-CM | POA: Diagnosis not present

## 2016-11-20 DIAGNOSIS — M1000 Idiopathic gout, unspecified site: Secondary | ICD-10-CM | POA: Diagnosis not present

## 2016-11-20 DIAGNOSIS — Z0000 Encounter for general adult medical examination without abnormal findings: Secondary | ICD-10-CM | POA: Diagnosis not present

## 2016-11-20 DIAGNOSIS — E663 Overweight: Secondary | ICD-10-CM | POA: Diagnosis not present

## 2016-11-20 DIAGNOSIS — Z1389 Encounter for screening for other disorder: Secondary | ICD-10-CM | POA: Diagnosis not present

## 2016-12-12 MED FILL — METOPROLOL TARTRATE 100 MG: 100 | 30 days supply | Qty: 60 | Fill #5

## 2017-01-09 MED FILL — LANTUS SOLOSTAR 100 UNITS/M: 100 | 60 days supply | Qty: 15 | Fill #1

## 2017-01-09 MED FILL — CLOPIDOGREL 75 MG TABLET: 75 | 30 days supply | Qty: 30 | Fill #8

## 2017-01-09 MED FILL — ALLOPURINOL 100 MG TABLET: 100 | 30 days supply | Qty: 90 | Fill #4

## 2017-01-09 MED FILL — metFORMIN HCL 1000 MG TABS: 1000 | 30 days supply | Qty: 60 | Fill #8

## 2017-01-09 MED FILL — METOPROLOL TARTRATE 100 MG: 100 | 30 days supply | Qty: 60 | Fill #6

## 2017-02-01 MED FILL — UNIFINE PENTIPS 31GX3/16: 31G X 5 MM | 90 days supply | Qty: 100 | Fill #0

## 2017-02-01 MED FILL — UNIFINE PENTIPS 31GX3/16": 31G X 5 MM | 90 days supply | Qty: 100 | Fill #0

## 2017-02-19 MED FILL — ALLOPURINOL 100 MG TABLET: 100 | 30 days supply | Qty: 90 | Fill #5

## 2017-02-19 MED FILL — CLOPIDOGREL 75 MG TABLET: 75 | 90 days supply | Qty: 90 | Fill #0

## 2017-03-29 MED FILL — BASAGLAR 100 UNIT/ML KWIKPE: 100 | 60 days supply | Qty: 15 | Fill #0

## 2017-03-29 MED FILL — AMLODIPINE-VALSARTAN 10-320: 10-320 | 90 days supply | Qty: 90 | Fill #1

## 2017-04-02 MED FILL — ALLOPURINOL 100 MG TABLET: 100 | 30 days supply | Qty: 90 | Fill #0

## 2017-05-02 MED FILL — metFORMIN HCL 1000 MG TABS: 1000 | 30 days supply | Qty: 60 | Fill #0

## 2017-05-03 MED FILL — METOPROLOL TARTRATE 100 MG: 100 | 30 days supply | Qty: 60 | Fill #0

## 2017-05-21 MED FILL — ATORVASTATIN 10 MG TABLET: 10 | 90 days supply | Qty: 90 | Fill #0

## 2017-05-21 MED FILL — ALLOPURINOL 100 MG TABLET: 100 | 30 days supply | Qty: 90 | Fill #1

## 2017-06-04 MED FILL — BASAGLAR 100 UNIT/ML KWIKPE: 100 | 60 days supply | Qty: 15 | Fill #1

## 2017-06-19 MED FILL — CLOPIDOGREL 75 MG TABLET: 75 | 90 days supply | Qty: 90 | Fill #1

## 2017-07-02 MED FILL — ALLOPURINOL 100 MG TABLET: 100 | 30 days supply | Qty: 90 | Fill #0

## 2017-07-02 MED FILL — metFORMIN HCL 1000 MG TABS: 1000 | 30 days supply | Qty: 60 | Fill #1

## 2017-07-09 MED FILL — AMLODIPINE-VALSARTAN 10-320: 10-320 | 30 days supply | Qty: 30 | Fill #2

## 2017-07-16 DIAGNOSIS — R600 Localized edema: Secondary | ICD-10-CM | POA: Diagnosis not present

## 2017-07-16 DIAGNOSIS — I10 Essential (primary) hypertension: Secondary | ICD-10-CM | POA: Diagnosis not present

## 2017-07-16 DIAGNOSIS — E7800 Pure hypercholesterolemia, unspecified: Secondary | ICD-10-CM | POA: Diagnosis not present

## 2017-07-16 DIAGNOSIS — Z23 Encounter for immunization: Secondary | ICD-10-CM | POA: Diagnosis not present

## 2017-07-16 DIAGNOSIS — E1122 Type 2 diabetes mellitus with diabetic chronic kidney disease: Secondary | ICD-10-CM | POA: Diagnosis not present

## 2017-07-16 DIAGNOSIS — N182 Chronic kidney disease, stage 2 (mild): Secondary | ICD-10-CM | POA: Diagnosis not present

## 2017-07-16 DIAGNOSIS — I2510 Atherosclerotic heart disease of native coronary artery without angina pectoris: Secondary | ICD-10-CM | POA: Diagnosis not present

## 2017-07-17 MED FILL — JANUVIA 100 MG TABLET: 100 | 30 days supply | Qty: 30 | Fill #0

## 2017-07-17 MED FILL — METOPROLOL TARTRATE 100 MG: 100 | 30 days supply | Qty: 60 | Fill #0

## 2017-08-13 MED FILL — BASAGLAR 100 UNIT/ML KWIKPE: 100 | 60 days supply | Qty: 15 | Fill #2

## 2017-08-13 MED FILL — METOPROLOL TARTRATE 100 MG: 100 | 30 days supply | Qty: 60 | Fill #1

## 2017-08-13 MED FILL — JANUVIA 100 MG TABLET: 100 | 30 days supply | Qty: 30 | Fill #1

## 2017-08-13 MED FILL — AMLODIPINE-VALSARTAN 10-320: 10-320 | 30 days supply | Qty: 30 | Fill #3

## 2017-08-13 MED FILL — ALLOPURINOL 100 MG TABLET: 100 | 30 days supply | Qty: 90 | Fill #1

## 2017-08-20 NOTE — Progress Notes (Signed)
Cardiology Office Note   Date:  08/21/2017   ID:  Gregory [name], DOB 1950/12/08, MRN [id number]  PCP:  Seward Carol, MD    No chief complaint on file.  CAD  Wt Readings from Last 3 Encounters:  08/21/17 241 lb 3.2 oz (109.4 kg)  09/20/15 230 lb (104.3 kg)  03/11/15 225 lb (102.1 kg)       History of Present Illness: Gregory [name] is a 66 y.o. male  who had overlapping stents to the LAD, DES in 8/13.  He was hoispitalized in 4/16 for Pneumonia. He did have a cardiology consult due to a prolonged run of nonsustained ventricular tachycardia which occurred early in the morning. The patient was asymptomatic. He was watched on telemetry and beta blockade was increased. He did not have any further episodes on telemetry. There is no syncope. He was sent home and he has felt well.  He works a as Art gallery manager.  Not doing any exercise.    Denies : Chest pain. Dizziness. Leg edema. Nitroglycerin use. Orthopnea. Palpitations. Paroxysmal nocturnal dyspnea.  Syncope.    His wife passed away.   He reports some DOE. Chronic leg swelling, left > right.    Past Medical History:  Diagnosis Date  . Arthritis   . Cancer ([place])   . Coronary artery disease   . Gout   . Hyperlipidemia   . Hypertension   . Myocardial infarction ([place]) 05/2012   "mild"  . Swelling of left lower extremity    LLE; "happens often"  . Type II diabetes mellitus ([place])     Past Surgical History:  Procedure Laterality Date  . BACK SURGERY    . CORONARY ANGIOPLASTY WITH STENT PLACEMENT  06/05/2012   "2; total of 2"  . LEFT HEART CATHETERIZATION WITH CORONARY ANGIOGRAM N/A 06/05/2012   Procedure: LEFT HEART CATHETERIZATION WITH CORONARY ANGIOGRAM;  Surgeon: Jettie Booze, MD;  Location: [facility] CATH LAB;  Service: Cardiovascular;  Laterality: N/A;  possible PCI  . LUMBAR DISC SURGERY  1990's  . LYMPHADENECTOMY Bilateral 02/25/2014   Procedure: [facility] LYMPH NODE DISSECTION";  Surgeon:  Molli Hazard, MD;  Location[facility];  Service: Urology;  Laterality: Bilateral;  . PROSTATE SURGERY    . ROBOT ASSISTED LAPAROSCOPIC RADICAL PROSTATECTOMY N/A 02/25/2014   Procedure: ROBOTIC ASSISTED LAPAROSCOPIC RADICAL  PROSTATECTOMY,  UMBILICAL HERNIA REPAIR;  Surgeon: Molli Hazard, MD;  Location: [facility];  Service: Urology;  Laterality: N/A;     Current Outpatient Prescriptions  Medication Sig Dispense Refill  . allopurinol (ZYLOPRIM) 100 MG tablet Take 300 mg by mouth daily as needed (gout).     Marland Kitchen amLODipine-valsartan (EXFORGE) 10-320 MG per tablet Take 1 tablet by mouth daily.    Marland Kitchen atorvastatin (LIPITOR) 10 MG tablet Take 10 mg by mouth daily at 6 PM.   1  . clopidogrel (PLAVIX) 75 MG tablet TAKE 1 TABLET (75 MG TOTAL) BY MOUTH DAILY. 90 tablet 0  . Insulin Glargine (BASAGLAR KWIKPEN) 100 UNIT/ML SOPN Inject 25 Units into the skin daily.  11  . insulin glargine (LANTUS) 100 UNIT/ML injection Inject 25 Units into the skin every morning.     Marland Kitchen JANUVIA 100 MG tablet Take 100 mg by mouth daily.  4  . metFORMIN (GLUCOPHAGE) 1000 MG tablet Take 1,000 mg by mouth 2 (two) times daily with a meal.    . metoprolol (LOPRESSOR) 100 MG tablet Take 1 tablet (100 mg total) by mouth 2 (two) times daily. [place]  tablet 3  . nitroGLYCERIN (NITROSTAT) 0.4 MG SL tablet Place 0.4 mg under the tongue every 5 (five) minutes as needed for chest pain.     No current facility-administered medications for this visit.     Allergies:   Patient has no known allergies.    Social History:  The patient  reports that he quit smoking about 23 years ago. His smoking use included Cigarettes. He has a 60.00 pack-year smoking history. He has never used smokeless tobacco. He reports that he does not drink alcohol or use drugs.   Family History:  The patient's family history includes Asthma in his father; Breast cancer in his mother; Parkinson's disease in his father.    ROS:  Please see the history of present  illness.   Otherwise, review of systems are positive for worsening leg edema.   All other systems are reviewed and negative.    PHYSICAL EXAM: VS:  BP 120/74   Pulse 66   Ht 5\' 7"  (1.702 m)   Wt 241 lb 3.2 oz (109.4 kg)   SpO2 95%   BMI 37.78 kg/m  , BMI Body mass index is 37.78 kg/m. GEN: Well nourished, well developed, in no acute distress , apears to be winded HEENT: normal  Neck: no JVD, carotid bruits, or masses Cardiac: RRR; no murmurs, rubs, or gallops, bilateral leg edema L>R Respiratory:  clear to auscultation bilaterally, normal work of breathing GI: soft, nontender, nondistended, + BS MS: no deformity or atrophy  Skin: warm and dry, no rash Neuro:  Strength and sensation are intact Psych: euthymic mood, full affect   EKG:   The ekg ordered today demonstrates NSR, NSST   Recent Labs: No results found for requested labs within last 8760 hours.   Lipid Panel No results found for: CHOL, TRIG, HDL, CHOLHDL, VLDL, LDLCALC, LDLDIRECT   Other studies Reviewed: Additional studies/ records that were reviewed today with results demonstrating: labs from 9/18 LDL 35, TG 372. EF 55-60 in 2016  ASSESSMENT AND PLAN:  1. CAD: s/p DES to LAD.   Rx for SL NTG. 2. DOE: Check BNP with blood test.  If this is significantly increased, consider 2D echo as well.   3. LE edema: Start Lasix 40 mg daily.  Check electrolytes in 1 week with BNP for DOE.  4. Hyperlipidemia: COntinue atorvastatin.   5. CRI: Cr 1.5.    Current medicines are reviewed at length with the patient today.  The patient concerns regarding his medicines were addressed.  The following changes have been made: add Lasix  Labs/ tests ordered today include:  No orders of the defined types were placed in this encounter.   Recommend 150 minutes/week of aerobic exercise Low fat, low carb, high fiber diet recommended  Disposition:   FU in 3 months   Signed, Larae Grooms, MD  08/21/2017 10:21 AM    [facility] [place], [place], [place]  [address] Phone: [phone]; Fax: [phone]

## 2017-08-21 ENCOUNTER — Encounter: Payer: Self-pay | Admitting: Interventional Cardiology

## 2017-08-21 ENCOUNTER — Encounter (INDEPENDENT_AMBULATORY_CARE_PROVIDER_SITE_OTHER): Payer: Self-pay

## 2017-08-21 ENCOUNTER — Ambulatory Visit (INDEPENDENT_AMBULATORY_CARE_PROVIDER_SITE_OTHER): Payer: Medicare Other | Admitting: Interventional Cardiology

## 2017-08-21 VITALS — BP 120/74 | HR 66 | Ht 67.0 in | Wt 241.2 lb

## 2017-08-21 DIAGNOSIS — E782 Mixed hyperlipidemia: Secondary | ICD-10-CM

## 2017-08-21 DIAGNOSIS — I209 Angina pectoris, unspecified: Secondary | ICD-10-CM | POA: Diagnosis not present

## 2017-08-21 DIAGNOSIS — R0602 Shortness of breath: Secondary | ICD-10-CM | POA: Diagnosis not present

## 2017-08-21 DIAGNOSIS — R600 Localized edema: Secondary | ICD-10-CM

## 2017-08-21 DIAGNOSIS — N183 Chronic kidney disease, stage 3 unspecified: Secondary | ICD-10-CM

## 2017-08-21 DIAGNOSIS — I25119 Atherosclerotic heart disease of native coronary artery with unspecified angina pectoris: Secondary | ICD-10-CM

## 2017-08-21 MED ORDER — NITROGLYCERIN 0.4 MG SL SUBL: 0.4000 mg | SUBLINGUAL_TABLET | SUBLINGUAL | 3 refills | Status: AC | PRN

## 2017-08-21 MED ORDER — FUROSEMIDE 40 MG PO TABS: 40.0000 mg | ORAL_TABLET | Freq: Every day | ORAL | 3 refills | Status: DC

## 2017-08-21 MED FILL — FUROSEMIDE 40 MG TAB: 40 | 90 days supply | Qty: 90 | Fill #0

## 2017-08-21 MED FILL — NITROGLYCERIN 0.4 MG TAB SL: 0.4 | 8 days supply | Qty: 25 | Fill #0

## 2017-08-21 NOTE — Patient Instructions (Signed)
Medication Instructions:  Your physician has recommended you make the following change in your medication:   START: furosemide (lasix) 40 mg daily  Labwork: Your physician recommends that you return for lab work in: 1 week for BMET and BNP   Testing/Procedures: None ordered  Follow-Up: Your physician recommends that you schedule a follow-up appointment in: 3 months with Dr. Irish Lack.   Any Other Special Instructions Will Be Listed Below (If Applicable).     If you need a refill on your cardiac medications before your next appointment, please call your pharmacy.

## 2017-08-27 ENCOUNTER — Other Ambulatory Visit: Payer: Medicare Other | Admitting: *Deleted

## 2017-08-27 DIAGNOSIS — R0602 Shortness of breath: Secondary | ICD-10-CM | POA: Diagnosis not present

## 2017-08-27 LAB — BASIC METABOLIC PANEL
BUN/Creatinine Ratio: 16 (ref 10–24)
BUN: 27 mg/dL (ref 8–27)
CO2: 20 mmol/L (ref 20–29)
Calcium: 9.1 mg/dL (ref 8.6–10.2)
Chloride: 104 mmol/L (ref 96–106)
Creatinine, Ser: 1.74 mg/dL — ABNORMAL HIGH (ref 0.76–1.27)
GFR calc Af Amer: 46 mL/min/{1.73_m2} — ABNORMAL LOW (ref >59)
GFR calc non Af Amer: 40 mL/min/{1.73_m2} — ABNORMAL LOW (ref >59)
Glucose: 147 mg/dL — ABNORMAL HIGH (ref 65–99)
Potassium: 4 mmol/L (ref 3.5–5.2)
Sodium: 137 mmol/L (ref 134–144)

## 2017-08-27 LAB — PRO B NATRIURETIC PEPTIDE: NT-Pro BNP: 31 pg/mL (ref 0–376)

## 2017-09-03 ENCOUNTER — Telehealth: Payer: Self-pay | Admitting: Interventional Cardiology

## 2017-09-03 NOTE — Telephone Encounter (Signed)
Patient made aware of results. Patient verbalizes understanding.  

## 2017-09-03 NOTE — Telephone Encounter (Signed)
New message  Pt verbalized that she is returning call for the rn   For lab results

## 2017-09-03 NOTE — Telephone Encounter (Signed)
-----   Message from Jettie Booze, MD sent at 08/28/2017  4:54 PM EST ----- BP ok.  Electrolytes ok.  Continue Lasix.

## 2017-09-17 MED FILL — JANUVIA 100 MG TABLET: 100 | 30 days supply | Qty: 30 | Fill #2

## 2017-09-17 MED FILL — AMLODIPINE-VALSARTAN 10-320: 10-320 | 30 days supply | Qty: 30 | Fill #4

## 2017-09-17 MED FILL — METOPROLOL TARTRATE 100 MG: 100 | 30 days supply | Qty: 60 | Fill #2

## 2017-09-17 MED FILL — ALLOPURINOL 100 MG TABLET: 100 | 30 days supply | Qty: 90 | Fill #2

## 2017-09-19 MED FILL — metFORMIN HCL 1000 MG TABS: 1000 | 30 days supply | Qty: 60 | Fill #2

## 2017-09-19 MED FILL — ATORVASTATIN 10 MG TABLET: 10 | 90 days supply | Qty: 90 | Fill #0

## 2017-10-29 DIAGNOSIS — M1000 Idiopathic gout, unspecified site: Secondary | ICD-10-CM | POA: Diagnosis not present

## 2017-10-29 DIAGNOSIS — H6122 Impacted cerumen, left ear: Secondary | ICD-10-CM | POA: Diagnosis not present

## 2017-10-29 DIAGNOSIS — E1122 Type 2 diabetes mellitus with diabetic chronic kidney disease: Secondary | ICD-10-CM | POA: Diagnosis not present

## 2017-10-29 DIAGNOSIS — I2510 Atherosclerotic heart disease of native coronary artery without angina pectoris: Secondary | ICD-10-CM | POA: Diagnosis not present

## 2017-10-29 DIAGNOSIS — I10 Essential (primary) hypertension: Secondary | ICD-10-CM | POA: Diagnosis not present

## 2017-10-29 DIAGNOSIS — E7800 Pure hypercholesterolemia, unspecified: Secondary | ICD-10-CM | POA: Diagnosis not present

## 2017-10-29 MED FILL — CLOPIDOGREL 75 MG TABLET: 75 | 90 days supply | Qty: 90 | Fill #0

## 2017-10-29 MED FILL — LANTUS SOLOSTAR 100 UNITS/M: 100 | 60 days supply | Qty: 15 | Fill #0

## 2017-11-01 MED FILL — AMLODIPINE-VALSARTAN 10-320: 10-320 | 30 days supply | Qty: 30 | Fill #5

## 2017-11-01 MED FILL — ALLOPURINOL 100 MG TABLET: 100 | 30 days supply | Qty: 90 | Fill #0

## 2017-11-01 MED FILL — metFORMIN HCL 1000 MG TABS: 1000 | 30 days supply | Qty: 60 | Fill #3

## 2017-11-26 MED FILL — FUROSEMIDE 40 MG TAB: 40 | 90 days supply | Qty: 90 | Fill #1

## 2017-11-26 MED FILL — metFORMIN HCL 1000 MG TABS: 1000 | 30 days supply | Qty: 60 | Fill #4

## 2017-11-26 MED FILL — ALLOPURINOL 100 MG TABLET: 100 | 30 days supply | Qty: 90 | Fill #1

## 2017-11-26 MED FILL — AMLODIPINE-VALSARTAN 10-320: 10-320 | 90 days supply | Qty: 90 | Fill #0

## 2017-12-03 MED FILL — JANUVIA 100 MG TABLET: 100 | 30 days supply | Qty: 30 | Fill #3

## 2017-12-17 MED FILL — METOPROLOL TARTRATE 100 MG: 100 | 30 days supply | Qty: 60 | Fill #3

## 2017-12-23 NOTE — Progress Notes (Signed)
Cardiology Office Note:    Date:  12/24/2017   ID:  Gregory [name], DOB [birth year], MRN [id number]  PCP:  Seward Carol, MD  Cardiologist:  Larae Grooms, MD  Referring MD: Seward Carol, MD   Chief Complaint  Patient presents with  . Follow-up    CAD    History of Present Illness:    Gregory [name] is a 67 y.o. male with a past medical history significant for CAD with overlapping stents to LAD 05/2012, hyperlipidemia, hypertension, CKD stage III, diabetes mellitus type 2. The patient has a history of a prolonged run of nonsustained ventricular tachycardia during the hospitalization for pneumonia in 01/2015.  He was asymptomatic with no recurrence and his beta-blocker was increased.  There was no syncope.  He was last seen by Dr. Irish Lack on 08/21/2017 at which time he was complaining of some dyspnea on exertion as well as lower extremity edema.  A BNP was checked and was 31.  Lasix 40 mg daily was added.  Left leg is chronically edematous due to prior achilles tendon tear. Has been evaluated in the past and negative for clot per pt.   Works as a Art gallery manager, no exercise.   No exertional symptoms. No symptoms like he had prior to his stent- was like indigestion.  No orthopnea, PND. Has edema, at his baseline per pt. Is taking lasix. No palpitations or fast heart beats.     Past Medical History:  Diagnosis Date  . Arthritis   . Cancer ([place])   . Coronary artery disease   . Gout   . Hyperlipidemia   . Hypertension   . Myocardial infarction ([place]) 05/2012   "mild"  . Swelling of left lower extremity    LLE; "happens often"  . Type II diabetes mellitus ([place])     Past Surgical History:  Procedure Laterality Date  . BACK SURGERY    . CORONARY ANGIOPLASTY WITH STENT PLACEMENT  06/05/2012   "2; total of 2"  . LEFT HEART CATHETERIZATION WITH CORONARY ANGIOGRAM N/A 06/05/2012   Procedure: LEFT HEART CATHETERIZATION WITH CORONARY ANGIOGRAM;  Surgeon: Jettie Booze, MD;   Location: [facility] CATH LAB;  Service: Cardiovascular;  Laterality: N/A;  possible PCI  . LUMBAR DISC SURGERY  1990's  . LYMPHADENECTOMY Bilateral 02/25/2014   Procedure: [facility] LYMPH NODE DISSECTION";  Surgeon: Molli Hazard, MD;  Location: [facility] ORS;  Service: Urology;  Laterality: Bilateral;  . PROSTATE SURGERY    . ROBOT ASSISTED LAPAROSCOPIC RADICAL PROSTATECTOMY N/A 02/25/2014   Procedure: ROBOTIC ASSISTED LAPAROSCOPIC RADICAL  PROSTATECTOMY,  UMBILICAL HERNIA REPAIR;  Surgeon: Molli Hazard, MD;  Location: [facility] ORS;  Service: Urology;  Laterality: N/A;    Current Medications: Current Meds  Medication Sig  . allopurinol (ZYLOPRIM) 100 MG tablet Take 300 mg by mouth daily as needed (gout).   Marland Kitchen amLODipine-valsartan (EXFORGE) 10-320 MG per tablet Take 1 tablet by mouth daily.  Marland Kitchen atorvastatin (LIPITOR) 10 MG tablet Take 10 mg by mouth daily at 6 PM.   . clopidogrel (PLAVIX) 75 MG tablet TAKE 1 TABLET (75 MG TOTAL) BY MOUTH DAILY.  . furosemide (LASIX) 40 MG tablet Take 1 tablet (40 mg total) by mouth daily.  . Insulin Glargine (BASAGLAR KWIKPEN) 100 UNIT/ML SOPN Inject 25 Units into the skin daily.  . insulin glargine (LANTUS) 100 UNIT/ML injection Inject 25 Units into the skin every morning.   Marland Kitchen JANUVIA 100 MG tablet Take 100 mg by mouth daily.  . metFORMIN (GLUCOPHAGE) 1000 MG  tablet Take 1,000 mg by mouth 2 (two) times daily with a meal.  . metoprolol (LOPRESSOR) 100 MG tablet Take 1 tablet (100 mg total) by mouth 2 (two) times daily.  . nitroGLYCERIN (NITROSTAT) 0.4 MG SL tablet Place 1 tablet (0.4 mg total) under the tongue every 5 (five) minutes as needed for chest pain.     Allergies:   Patient has no known allergies.   Social History   Socioeconomic History  . Marital status: Married    Spouse name: None  . Number of children: None  . Years of education: None  . Highest education level: None  Social Needs  . Financial resource strain: None  . Food  insecurity - worry: None  . Food insecurity - inability: None  . Transportation needs - medical: None  . Transportation needs - non-medical: None  Occupational History  . None  Tobacco Use  . Smoking status: Former Smoker    Packs/day: 2.00    Years: 30.00    Pack years: 60.00    Types: Cigarettes    Last attempt to quit: 10/23/1993    Years since quitting: 24.1  . Smokeless tobacco: Never Used  Substance and Sexual Activity  . Alcohol use: No  . Drug use: No  . Sexual activity: Not Currently  Other Topics Concern  . None  Social History Narrative  . None     Family History: The patient's family history includes Asthma in his father; Breast cancer in his mother; Parkinson's disease in his father. ROS:   Please see the history of present illness.     All other systems reviewed and are negative.  EKGs/Labs/Other Studies Reviewed:    The following studies were reviewed today:  Echo 02/07/2015:  EF 55-60%, Moderate LVH, grade 1 DD, Trace MR & TR  Low risk myoview 10/06/13, EF 44%.    EKG:  EKG is not ordered today.    Recent Labs: 08/27/2017: BUN 27; Creatinine, Ser 1.74; NT-Pro BNP 31; Potassium 4.0; Sodium 137   Recent Lipid Panel No results found for: CHOL, TRIG, HDL, CHOLHDL, VLDL, LDLCALC, LDLDIRECT  Physical Exam:    VS:  BP 120/74 (BP Location: Right Arm, Patient Position: Sitting, Cuff Size: Normal)   Pulse 66   Ht 5\' 7"  (1.702 m)   Wt 243 lb (110.2 kg)   SpO2 93%   BMI 38.06 kg/m     Wt Readings from Last 3 Encounters:  12/24/17 243 lb (110.2 kg)  08/21/17 241 lb 3.2 oz (109.4 kg)  09/20/15 230 lb (104.3 kg)     Physical Exam  Constitutional: He is oriented to person, place, and time. He appears well-developed and well-nourished.  Central obesity  HENT:  Head: Normocephalic and atraumatic.  Neck: Normal range of motion. Neck supple. No JVD present.  Cardiovascular: Normal rate, regular rhythm, normal heart sounds and intact distal pulses. Exam  reveals no gallop and no friction rub.  No murmur heard. Pulmonary/Chest: Effort normal and breath sounds normal. No respiratory distress. He has no wheezes. He has no rales. He exhibits no tenderness.  Abdominal: Soft. Bowel sounds are normal.  Musculoskeletal: Normal range of motion. He exhibits edema.  R- 1+ pretibial edema. L-2-3 + pretibial edema  Neurological: He is alert and oriented to person, place, and time.  Skin: Skin is warm and dry.  Psychiatric: He has a normal mood and affect. His behavior is normal. Thought content normal.    ASSESSMENT:    1. Atherosclerosis of native  coronary artery of native heart without angina pectoris   2. Essential hypertension   3. Mixed hyperlipidemia   4. Chronic kidney disease, stage 3 (HCC)    PLAN:    In order of problems listed above:  CAD: S/P DES to LAD 2013. No exertional symptoms. On Plavix, BB, statin and tolerating his meds without problems. Continue all current therapy. Pt advised to start exercise, discussed walking. DASH diet discussed with 5 servings of fruits and vegetables per day, low sodium, whole grains, eating home prepared foods instead of fast food.   Hyperlipidemia: Continue atorvastatin. Followed by Dr. Delfina Redwood, Goal LDL <70.  Hypertension: BP well controlled. Could consider stopping amlodipine if not already tried to see if it impacts his lower extremity edema  Chronic lower extremity edema. On lasix with little change per pt. BNP was normal at previous visit.  Left is considerable larger since had achiles tendon tear. Likely venous insufficiency. Pt advised on elevation, low sodium diet.   CRI: Stable. Tolerated addition of lasix at last visit. Sees his PCP q 3 months.   Diabetes on insulin: managed by PCP. Discussed diet as above.   Medication Adjustments/Labs and Tests Ordered: Current medicines are reviewed at length with the patient today.  Concerns regarding medicines are outlined above. Labs and tests  ordered and medication changes are outlined in the patient instructions below:  Patient Instructions  Medication Instructions:  Your physician recommends that you continue on your current medications as directed. Please refer to the Current Medication list given to you today.   Labwork: None ordered  Testing/Procedures: None ordered  Follow-Up: Your physician wants you to follow-up in 6 months with Dr. Irish Lack. You will receive a reminder letter in the mail two months in advance. If you don't receive a letter, please call our office to schedule the follow-up appointment.   Any Other Special Instructions Will Be Listed Below (If Applicable).  If you need a refill on your cardiac medications before your next appointment, please call your pharmacy.   DASH Eating Plan DASH stands for "Dietary Approaches to Stop Hypertension." The DASH eating plan is a healthy eating plan that has been shown to reduce high blood pressure (hypertension). It may also reduce your risk for type 2 diabetes, heart disease, and stroke. The DASH eating plan may also help with weight loss. What are tips for following this plan? General guidelines  Avoid eating more than 2,300 mg (milligrams) of salt (sodium) a day. If you have hypertension, you may need to reduce your sodium intake to 1,500 mg a day.  Limit alcohol intake to no more than 1 drink a day for nonpregnant women and 2 drinks a day for men. One drink equals 12 oz of beer, 5 oz of wine, or 1 oz of hard liquor.  Work with your health care provider to maintain a healthy body weight or to lose weight. Ask what an ideal weight is for you.  Get at least 30 minutes of exercise that causes your heart to beat faster (aerobic exercise) most days of the week. Activities may include walking, swimming, or biking.  Work with your health care provider or diet and nutrition specialist (dietitian) to adjust your eating plan to your individual calorie needs. Reading  food labels  Check food labels for the amount of sodium per serving. Choose foods with less than 5 percent of the Daily Value of sodium. Generally, foods with less than 300 mg of sodium per serving fit into this eating  plan.  To find whole grains, look for the word "whole" as the first word in the ingredient list. Shopping  Buy products labeled as "low-sodium" or "no salt added."  Buy fresh foods. Avoid canned foods and premade or frozen meals. Cooking  Avoid adding salt when cooking. Use salt-free seasonings or herbs instead of table salt or sea salt. Check with your health care provider or pharmacist before using salt substitutes.  Do not fry foods. Cook foods using healthy methods such as baking, boiling, grilling, and broiling instead.  Cook with heart-healthy oils, such as olive, canola, soybean, or sunflower oil. Meal planning   Eat a balanced diet that includes: ? 5 or more servings of fruits and vegetables each day. At each meal, try to fill half of your plate with fruits and vegetables. ? Up to 6-8 servings of whole grains each day. ? Less than 6 oz of lean meat, poultry, or fish each day. A 3-oz serving of meat is about the same size as a deck of cards. One egg equals 1 oz. ? 2 servings of low-fat dairy each day. ? A serving of nuts, seeds, or beans 5 times each week. ? Heart-healthy fats. Healthy fats called Omega-3 fatty acids are found in foods such as flaxseeds and coldwater fish, like sardines, salmon, and mackerel.  Limit how much you eat of the following: ? Canned or prepackaged foods. ? Food that is high in trans fat, such as fried foods. ? Food that is high in saturated fat, such as fatty meat. ? Sweets, desserts, sugary drinks, and other foods with added sugar. ? Full-fat dairy products.  Do not salt foods before eating.  Try to eat at least 2 vegetarian meals each week.  Eat more home-cooked food and less restaurant, buffet, and fast food.  When eating at  a restaurant, ask that your food be prepared with less salt or no salt, if possible. What foods are recommended? The items listed may not be a complete list. Talk with your dietitian about what dietary choices are best for you. Grains Whole-grain or whole-wheat bread. Whole-grain or whole-wheat pasta. Brown rice. [place]. Bulgur. Whole-grain and low-sodium cereals. Pita bread. Low-fat, low-sodium crackers. Whole-wheat flour tortillas. Vegetables Fresh or frozen vegetables (raw, steamed, roasted, or grilled). Low-sodium or reduced-sodium tomato and vegetable juice. Low-sodium or reduced-sodium tomato sauce and tomato paste. Low-sodium or reduced-sodium canned vegetables. Fruits All fresh, dried, or frozen fruit. Canned fruit in natural juice (without added sugar). Meat and other protein foods Skinless chicken or [place]. Ground chicken or [place]. Pork with fat trimmed off. Fish and seafood. Egg whites. Dried beans, peas, or lentils. Unsalted nuts, nut butters, and seeds. Unsalted canned beans. Lean cuts of beef with fat trimmed off. Low-sodium, lean deli meat. Dairy Low-fat (1%) or fat-free (skim) milk. Fat-free, low-fat, or reduced-fat cheeses. Nonfat, low-sodium ricotta or cottage cheese. Low-fat or nonfat yogurt. Low-fat, low-sodium cheese. Fats and oils Soft margarine without trans fats. Vegetable oil. Low-fat, reduced-fat, or light mayonnaise and salad dressings (reduced-sodium). Canola, safflower, olive, soybean, and sunflower oils. Avocado. Seasoning and other foods Herbs. Spices. Seasoning mixes without salt. Unsalted popcorn and pretzels. Fat-free sweets. What foods are not recommended? The items listed may not be a complete list. Talk with your dietitian about what dietary choices are best for you. Grains Baked goods made with fat, such as croissants, muffins, or some breads. Dry pasta or rice meal packs. Vegetables Creamed or fried vegetables. Vegetables in a cheese sauce.  Regular  canned vegetables (not low-sodium or reduced-sodium). Regular canned tomato sauce and paste (not low-sodium or reduced-sodium). Regular tomato and vegetable juice (not low-sodium or reduced-sodium). Angie Fava. Olives. Fruits Canned fruit in a light or heavy syrup. Fried fruit. Fruit in cream or butter sauce. Meat and other protein foods Fatty cuts of meat. Ribs. Fried meat. Berniece Salines. Sausage. Bologna and other processed lunch meats. Salami. Fatback. Hotdogs. Bratwurst. Salted nuts and seeds. Canned beans with added salt. Canned or smoked fish. Whole eggs or egg yolks. Chicken or [place] with skin. Dairy Whole or 2% milk, cream, and half-and-half. Whole or full-fat cream cheese. Whole-fat or sweetened yogurt. Full-fat cheese. Nondairy creamers. Whipped toppings. Processed cheese and cheese spreads. Fats and oils Butter. Stick margarine. Lard. Shortening. Ghee. Bacon fat. Tropical oils, such as coconut, palm kernel, or palm oil. Seasoning and other foods Salted popcorn and pretzels. Onion salt, garlic salt, seasoned salt, table salt, and sea salt. Worcestershire sauce. Tartar sauce. Barbecue sauce. Teriyaki sauce. Soy sauce, including reduced-sodium. Steak sauce. Canned and packaged gravies. Fish sauce. Oyster sauce. Cocktail sauce. Horseradish that you find on the shelf. Ketchup. Mustard. Meat flavorings and tenderizers. Bouillon cubes. Hot sauce and Tabasco sauce. Premade or packaged marinades. Premade or packaged taco seasonings. Relishes. Regular salad dressings. Where to find more information:  National Heart, Lung, and [place]: https://wilson-eaton.com/  American Heart Association: www.heart.org Summary  The DASH eating plan is a healthy eating plan that has been shown to reduce high blood pressure (hypertension). It may also reduce your risk for type 2 diabetes, heart disease, and stroke.  With the DASH eating plan, you should limit salt (sodium) intake to 2,300 mg a day. If you have  hypertension, you may need to reduce your sodium intake to 1,500 mg a day.  When on the DASH eating plan, aim to eat more fresh fruits and vegetables, whole grains, lean proteins, low-fat dairy, and heart-healthy fats.  Work with your health care provider or diet and nutrition specialist (dietitian) to adjust your eating plan to your individual calorie needs. This information is not intended to replace advice given to you by your health care provider. Make sure you discuss any questions you have with your health care provider. Document Released: 09/28/2011 Document Revised: 10/02/2016 Document Reviewed: 10/02/2016 Elsevier Interactive Patient Education  2018 [employer].   Low-Sodium Eating Plan Sodium, which is an element that makes up salt, helps you maintain a healthy balance of fluids in your body. Too much sodium can increase your blood pressure and cause fluid and waste to be held in your body. Your health care provider or dietitian may recommend following this plan if you have high blood pressure (hypertension), kidney disease, liver disease, or heart failure. Eating less sodium can help lower your blood pressure, reduce swelling, and protect your heart, liver, and kidneys. What are tips for following this plan? General guidelines  Most people on this plan should limit their sodium intake to 1,500-2,000 mg (milligrams) of sodium each day. Reading food labels  The Nutrition Facts label lists the amount of sodium in one serving of the food. If you eat more than one serving, you must multiply the listed amount of sodium by the number of servings.  Choose foods with less than 140 mg of sodium per serving.  Avoid foods with 300 mg of sodium or more per serving. Shopping  Look for lower-sodium products, often labeled as "low-sodium" or "no salt added."  Always check the sodium content even if foods are labeled  as "unsalted" or "no salt added".  Buy fresh foods. ? Avoid canned foods  and premade or frozen meals. ? Avoid canned, cured, or processed meats  Buy breads that have less than 80 mg of sodium per slice. Cooking  Eat more home-cooked food and less restaurant, buffet, and fast food.  Avoid adding salt when cooking. Use salt-free seasonings or herbs instead of table salt or sea salt. Check with your health care provider or pharmacist before using salt substitutes.  Cook with plant-based oils, such as canola, sunflower, or olive oil. Meal planning  When eating at a restaurant, ask that your food be prepared with less salt or no salt, if possible.  Avoid foods that contain MSG (monosodium glutamate). MSG is sometimes added to [place] food, bouillon, and some canned foods. What foods are recommended? The items listed may not be a complete list. Talk with your dietitian about what dietary choices are best for you. Grains Low-sodium cereals, including oats, puffed wheat and rice, and shredded wheat. Low-sodium crackers. Unsalted rice. Unsalted pasta. Low-sodium bread. Whole-grain breads and whole-grain pasta. Vegetables Fresh or frozen vegetables. "No salt added" canned vegetables. "No salt added" tomato sauce and paste. Low-sodium or reduced-sodium tomato and vegetable juice. Fruits Fresh, frozen, or canned fruit. Fruit juice. Meats and other protein foods Fresh or frozen (no salt added) meat, poultry, seafood, and fish. Low-sodium canned tuna and salmon. Unsalted nuts. Dried peas, beans, and lentils without added salt. Unsalted canned beans. Eggs. Unsalted nut butters. Dairy Milk. Soy milk. Cheese that is naturally low in sodium, such as ricotta cheese, fresh mozzarella, or Swiss cheese Low-sodium or reduced-sodium cheese. Cream cheese. Yogurt. Fats and oils Unsalted butter. Unsalted margarine with no trans fat. Vegetable oils such as canola or olive oils. Seasonings and other foods Fresh and dried herbs and spices. Salt-free seasonings. Low-sodium mustard and  ketchup. Sodium-free salad dressing. Sodium-free light mayonnaise. Fresh or refrigerated horseradish. Lemon juice. Vinegar. Homemade, reduced-sodium, or low-sodium soups. Unsalted popcorn and pretzels. Low-salt or salt-free chips. What foods are not recommended? The items listed may not be a complete list. Talk with your dietitian about what dietary choices are best for you. Grains Instant hot cereals. Bread stuffing, pancake, and biscuit mixes. Croutons. Seasoned rice or pasta mixes. Noodle soup cups. Boxed or frozen macaroni and cheese. Regular salted crackers. Self-rising flour. Vegetables Sauerkraut, pickled vegetables, and relishes. Olives. [place] fries. Onion rings. Regular canned vegetables (not low-sodium or reduced-sodium). Regular canned tomato sauce and paste (not low-sodium or reduced-sodium). Regular tomato and vegetable juice (not low-sodium or reduced-sodium). Frozen vegetables in sauces. Meats and other protein foods Meat or fish that is salted, canned, smoked, spiced, or pickled. Bacon, ham, sausage, hotdogs, corned beef, chipped beef, packaged lunch meats, salt pork, jerky, pickled herring, anchovies, regular canned tuna, sardines, salted nuts. Dairy Processed cheese and cheese spreads. Cheese curds. Blue cheese. Feta cheese. String cheese. Regular cottage cheese. Buttermilk. Canned milk. Fats and oils Salted butter. Regular margarine. Ghee. Bacon fat. Seasonings and other foods Onion salt, garlic salt, seasoned salt, table salt, and sea salt. Canned and packaged gravies. Worcestershire sauce. Tartar sauce. Barbecue sauce. Teriyaki sauce. Soy sauce, including reduced-sodium. Steak sauce. Fish sauce. Oyster sauce. Cocktail sauce. Horseradish that you find on the shelf. Regular ketchup and mustard. Meat flavorings and tenderizers. Bouillon cubes. Hot sauce and Tabasco sauce. Premade or packaged marinades. Premade or packaged taco seasonings. Relishes. Regular salad dressings. Salsa.  Potato and tortilla chips. Corn chips and puffs. Salted popcorn and pretzels.  Canned or dried soups. Pizza. Frozen entrees and pot pies. Summary  Eating less sodium can help lower your blood pressure, reduce swelling, and protect your heart, liver, and kidneys.  Most people on this plan should limit their sodium intake to 1,500-2,000 mg (milligrams) of sodium each day.  Canned, boxed, and frozen foods are high in sodium. Restaurant foods, fast foods, and pizza are also very high in sodium. You also get sodium by adding salt to food.  Try to cook at home, eat more fresh fruits and vegetables, and eat less fast food, canned, processed, or prepared foods. This information is not intended to replace advice given to you by your health care provider. Make sure you discuss any questions you have with your health care provider. Document Released: 03/31/2002 Document Revised: 10/02/2016 Document Reviewed: 10/02/2016 Elsevier Interactive Patient Education  2018 [place], Daune Perch, NP  12/24/2017 9:25 AM    [facility]

## 2017-12-24 ENCOUNTER — Ambulatory Visit: Payer: Medicare HMO | Admitting: Cardiology

## 2017-12-24 ENCOUNTER — Ambulatory Visit: Payer: Self-pay | Admitting: Interventional Cardiology

## 2017-12-24 ENCOUNTER — Encounter: Payer: Self-pay | Admitting: Cardiology

## 2017-12-24 VITALS — BP 120/74 | HR 66 | Ht 67.0 in | Wt 243.0 lb

## 2017-12-24 DIAGNOSIS — Z86010 Personal history of colonic polyps: Secondary | ICD-10-CM | POA: Diagnosis not present

## 2017-12-24 DIAGNOSIS — I10 Essential (primary) hypertension: Secondary | ICD-10-CM

## 2017-12-24 DIAGNOSIS — N183 Chronic kidney disease, stage 3 unspecified: Secondary | ICD-10-CM

## 2017-12-24 DIAGNOSIS — E782 Mixed hyperlipidemia: Secondary | ICD-10-CM

## 2017-12-24 DIAGNOSIS — E119 Type 2 diabetes mellitus without complications: Secondary | ICD-10-CM | POA: Diagnosis not present

## 2017-12-24 DIAGNOSIS — I2510 Atherosclerotic heart disease of native coronary artery without angina pectoris: Secondary | ICD-10-CM | POA: Diagnosis not present

## 2017-12-24 MED FILL — LANTUS SOLOSTAR 100 UNITS/M: 100 | 60 days supply | Qty: 15 | Fill #1

## 2017-12-24 MED FILL — ATORVASTATIN 10 MG TABLET: 10 | 90 days supply | Qty: 90 | Fill #1

## 2017-12-24 MED FILL — metFORMIN HCL 1000 MG TABS: 1000 | 30 days supply | Qty: 60 | Fill #5

## 2017-12-24 MED FILL — ALLOPURINOL 100 MG TABLET: 100 | 30 days supply | Qty: 90 | Fill #2

## 2017-12-24 NOTE — Patient Instructions (Addendum)
Medication Instructions:  Your physician recommends that you continue on your current medications as directed. Please refer to the Current Medication list given to you today.   Labwork: None ordered  Testing/Procedures: None ordered  Follow-Up: Your physician wants you to follow-up in 6 months with Dr. Irish [name]. You will receive a reminder letter in the mail two months in advance. If you don't receive a letter, please call our office to schedule the follow-up appointment.   Any Other Special Instructions Will Be Listed Below (If Applicable).  If you need a refill on your cardiac medications before your next appointment, please call your pharmacy.   DASH Eating Plan DASH stands for "Dietary Approaches to Stop Hypertension." The DASH eating plan is a healthy eating plan that has been shown to reduce high blood pressure (hypertension). It may also reduce your risk for type 2 diabetes, heart disease, and stroke. The DASH eating plan may also help with weight loss. What are tips for following this plan? General guidelines  Avoid eating more than 2,300 mg (milligrams) of salt (sodium) a day. If you have hypertension, you may need to reduce your sodium intake to 1,500 mg a day.  Limit alcohol intake to no more than 1 drink a day for nonpregnant women and 2 drinks a day for men. One drink equals 12 oz of beer, 5 oz of wine, or 1 oz of hard liquor.  Work with your health care provider to maintain a healthy body weight or to lose weight. Ask what an ideal weight is for you.  Get at least 30 minutes of exercise that causes your heart to beat faster (aerobic exercise) most days of the week. Activities may include walking, swimming, or biking.  Work with your health care provider or diet and nutrition specialist (dietitian) to adjust your eating plan to your individual calorie needs. Reading food labels  Check food labels for the amount of sodium per serving. Choose foods with less than 5  percent of the Daily Value of sodium. Generally, foods with less than 300 mg of sodium per serving fit into this eating plan.  To find whole grains, look for the word "whole" as the first word in the ingredient list. Shopping  Buy products labeled as "low-sodium" or "no salt added."  Buy fresh foods. Avoid canned foods and premade or frozen meals. Cooking  Avoid adding salt when cooking. Use salt-free seasonings or herbs instead of table salt or sea salt. Check with your health care provider or pharmacist before using salt substitutes.  Do not fry foods. Cook foods using healthy methods such as baking, boiling, grilling, and broiling instead.  Cook with heart-healthy oils, such as olive, canola, soybean, or sunflower oil. Meal planning   Eat a balanced diet that includes: ? 5 or more servings of fruits and vegetables each day. At each meal, try to fill half of your plate with fruits and vegetables. ? Up to 6-8 servings of whole grains each day. ? Less than 6 oz of lean meat, poultry, or fish each day. A 3-oz serving of meat is about the same size as a deck of cards. One egg equals 1 oz. ? 2 servings of low-fat dairy each day. ? A serving of nuts, seeds, or beans 5 times each week. ? Heart-healthy fats. Healthy fats called Omega-3 fatty acids are found in foods such as flaxseeds and coldwater fish, like sardines, salmon, and mackerel.  Limit how much you eat of the following: ? Canned or prepackaged  foods. ? Food that is high in trans fat, such as fried foods. ? Food that is high in saturated fat, such as fatty meat. ? Sweets, desserts, sugary drinks, and other foods with added sugar. ? Full-fat dairy products.  Do not salt foods before eating.  Try to eat at least 2 vegetarian meals each week.  Eat more home-cooked food and less restaurant, buffet, and fast food.  When eating at a restaurant, ask that your food be prepared with less salt or no salt, if possible. What foods are  recommended? The items listed may not be a complete list. Talk with your dietitian about what dietary choices are best for you. Grains Whole-grain or whole-wheat bread. Whole-grain or whole-wheat pasta. Brown rice. [place]. Bulgur. Whole-grain and low-sodium cereals. Pita bread. Low-fat, low-sodium crackers. Whole-wheat flour tortillas. Vegetables Fresh or frozen vegetables (raw, steamed, roasted, or grilled). Low-sodium or reduced-sodium tomato and vegetable juice. Low-sodium or reduced-sodium tomato sauce and tomato paste. Low-sodium or reduced-sodium canned vegetables. Fruits All fresh, dried, or frozen fruit. Canned fruit in natural juice (without added sugar). Meat and other protein foods Skinless chicken or [place]. Ground chicken or [place]. Pork with fat trimmed off. Fish and seafood. Egg whites. Dried beans, peas, or lentils. Unsalted nuts, nut butters, and seeds. Unsalted canned beans. Lean cuts of beef with fat trimmed off. Low-sodium, lean deli meat. Dairy Low-fat (1%) or fat-free (skim) milk. Fat-free, low-fat, or reduced-fat cheeses. Nonfat, low-sodium ricotta or cottage cheese. Low-fat or nonfat yogurt. Low-fat, low-sodium cheese. Fats and oils Soft margarine without trans fats. Vegetable oil. Low-fat, reduced-fat, or light mayonnaise and salad dressings (reduced-sodium). Canola, safflower, olive, soybean, and sunflower oils. Avocado. Seasoning and other foods Herbs. Spices. Seasoning mixes without salt. Unsalted popcorn and pretzels. Fat-free sweets. What foods are not recommended? The items listed may not be a complete list. Talk with your dietitian about what dietary choices are best for you. Grains Baked goods made with fat, such as croissants, muffins, or some breads. Dry pasta or rice meal packs. Vegetables Creamed or fried vegetables. Vegetables in a cheese sauce. Regular canned vegetables (not low-sodium or reduced-sodium). Regular canned tomato sauce and paste (not  low-sodium or reduced-sodium). Regular tomato and vegetable juice (not low-sodium or reduced-sodium). Angie Fava. Olives. Fruits Canned fruit in a light or heavy syrup. Fried fruit. Fruit in cream or butter sauce. Meat and other protein foods Fatty cuts of meat. Ribs. Fried meat. Berniece Salines. Sausage. Bologna and other processed lunch meats. Salami. Fatback. Hotdogs. Bratwurst. Salted nuts and seeds. Canned beans with added salt. Canned or smoked fish. Whole eggs or egg yolks. Chicken or [place] with skin. Dairy Whole or 2% milk, cream, and half-and-half. Whole or full-fat cream cheese. Whole-fat or sweetened yogurt. Full-fat cheese. Nondairy creamers. Whipped toppings. Processed cheese and cheese spreads. Fats and oils Butter. Stick margarine. Lard. Shortening. Ghee. Bacon fat. Tropical oils, such as coconut, palm kernel, or palm oil. Seasoning and other foods Salted popcorn and pretzels. Onion salt, garlic salt, seasoned salt, table salt, and sea salt. Worcestershire sauce. Tartar sauce. Barbecue sauce. Teriyaki sauce. Soy sauce, including reduced-sodium. Steak sauce. Canned and packaged gravies. Fish sauce. Oyster sauce. Cocktail sauce. Horseradish that you find on the shelf. Ketchup. Mustard. Meat flavorings and tenderizers. Bouillon cubes. Hot sauce and Tabasco sauce. Premade or packaged marinades. Premade or packaged taco seasonings. Relishes. Regular salad dressings. Where to find more information:  National Heart, Lung, and [place]: https://wilson-eaton.com/  American Heart Association: www.heart.org Summary  The DASH eating  plan is a healthy eating plan that has been shown to reduce high blood pressure (hypertension). It may also reduce your risk for type 2 diabetes, heart disease, and stroke.  With the DASH eating plan, you should limit salt (sodium) intake to 2,300 mg a day. If you have hypertension, you may need to reduce your sodium intake to 1,500 mg a day.  When on the DASH eating plan,  aim to eat more fresh fruits and vegetables, whole grains, lean proteins, low-fat dairy, and heart-healthy fats.  Work with your health care provider or diet and nutrition specialist (dietitian) to adjust your eating plan to your individual calorie needs. This information is not intended to replace advice given to you by your health care provider. Make sure you discuss any questions you have with your health care provider. Document Released: 09/28/2011 Document Revised: 10/02/2016 Document Reviewed: 10/02/2016 Elsevier Interactive Patient Education  2018 [employer].   Low-Sodium Eating Plan Sodium, which is an element that makes up salt, helps you maintain a healthy balance of fluids in your body. Too much sodium can increase your blood pressure and cause fluid and waste to be held in your body. Your health care provider or dietitian may recommend following this plan if you have high blood pressure (hypertension), kidney disease, liver disease, or heart failure. Eating less sodium can help lower your blood pressure, reduce swelling, and protect your heart, liver, and kidneys. What are tips for following this plan? General guidelines  Most people on this plan should limit their sodium intake to 1,500-2,000 mg (milligrams) of sodium each day. Reading food labels  The Nutrition Facts label lists the amount of sodium in one serving of the food. If you eat more than one serving, you must multiply the listed amount of sodium by the number of servings.  Choose foods with less than 140 mg of sodium per serving.  Avoid foods with 300 mg of sodium or more per serving. Shopping  Look for lower-sodium products, often labeled as "low-sodium" or "no salt added."  Always check the sodium content even if foods are labeled as "unsalted" or "no salt added".  Buy fresh foods. ? Avoid canned foods and premade or frozen meals. ? Avoid canned, cured, or processed meats  Buy breads that have less than 80  mg of sodium per slice. Cooking  Eat more home-cooked food and less restaurant, buffet, and fast food.  Avoid adding salt when cooking. Use salt-free seasonings or herbs instead of table salt or sea salt. Check with your health care provider or pharmacist before using salt substitutes.  Cook with plant-based oils, such as canola, sunflower, or olive oil. Meal planning  When eating at a restaurant, ask that your food be prepared with less salt or no salt, if possible.  Avoid foods that contain MSG (monosodium glutamate). MSG is sometimes added to [place] food, bouillon, and some canned foods. What foods are recommended? The items listed may not be a complete list. Talk with your dietitian about what dietary choices are best for you. Grains Low-sodium cereals, including oats, puffed wheat and rice, and shredded wheat. Low-sodium crackers. Unsalted rice. Unsalted pasta. Low-sodium bread. Whole-grain breads and whole-grain pasta. Vegetables Fresh or frozen vegetables. "No salt added" canned vegetables. "No salt added" tomato sauce and paste. Low-sodium or reduced-sodium tomato and vegetable juice. Fruits Fresh, frozen, or canned fruit. Fruit juice. Meats and other protein foods Fresh or frozen (no salt added) meat, poultry, seafood, and fish. Low-sodium canned tuna and  salmon. Unsalted nuts. Dried peas, beans, and lentils without added salt. Unsalted canned beans. Eggs. Unsalted nut butters. Dairy Milk. Soy milk. Cheese that is naturally low in sodium, such as ricotta cheese, fresh mozzarella, or Swiss cheese Low-sodium or reduced-sodium cheese. Cream cheese. Yogurt. Fats and oils Unsalted butter. Unsalted margarine with no trans fat. Vegetable oils such as canola or olive oils. Seasonings and other foods Fresh and dried herbs and spices. Salt-free seasonings. Low-sodium mustard and ketchup. Sodium-free salad dressing. Sodium-free light mayonnaise. Fresh or refrigerated horseradish. Lemon  juice. Vinegar. Homemade, reduced-sodium, or low-sodium soups. Unsalted popcorn and pretzels. Low-salt or salt-free chips. What foods are not recommended? The items listed may not be a complete list. Talk with your dietitian about what dietary choices are best for you. Grains Instant hot cereals. Bread stuffing, pancake, and biscuit mixes. Croutons. Seasoned rice or pasta mixes. Noodle soup cups. Boxed or frozen macaroni and cheese. Regular salted crackers. Self-rising flour. Vegetables Sauerkraut, pickled vegetables, and relishes. Olives. [place] fries. Onion rings. Regular canned vegetables (not low-sodium or reduced-sodium). Regular canned tomato sauce and paste (not low-sodium or reduced-sodium). Regular tomato and vegetable juice (not low-sodium or reduced-sodium). Frozen vegetables in sauces. Meats and other protein foods Meat or fish that is salted, canned, smoked, spiced, or pickled. Bacon, ham, sausage, hotdogs, corned beef, chipped beef, packaged lunch meats, salt pork, jerky, pickled herring, anchovies, regular canned tuna, sardines, salted nuts. Dairy Processed cheese and cheese spreads. Cheese curds. Blue cheese. Feta cheese. String cheese. Regular cottage cheese. Buttermilk. Canned milk. Fats and oils Salted butter. Regular margarine. Ghee. Bacon fat. Seasonings and other foods Onion salt, garlic salt, seasoned salt, table salt, and sea salt. Canned and packaged gravies. Worcestershire sauce. Tartar sauce. Barbecue sauce. Teriyaki sauce. Soy sauce, including reduced-sodium. Steak sauce. Fish sauce. Oyster sauce. Cocktail sauce. Horseradish that you find on the shelf. Regular ketchup and mustard. Meat flavorings and tenderizers. Bouillon cubes. Hot sauce and Tabasco sauce. Premade or packaged marinades. Premade or packaged taco seasonings. Relishes. Regular salad dressings. Salsa. Potato and tortilla chips. Corn chips and puffs. Salted popcorn and pretzels. Canned or dried soups. Pizza.  Frozen entrees and pot pies. Summary  Eating less sodium can help lower your blood pressure, reduce swelling, and protect your heart, liver, and kidneys.  Most people on this plan should limit their sodium intake to 1,500-2,000 mg (milligrams) of sodium each day.  Canned, boxed, and frozen foods are high in sodium. Restaurant foods, fast foods, and pizza are also very high in sodium. You also get sodium by adding salt to food.  Try to cook at home, eat more fresh fruits and vegetables, and eat less fast food, canned, processed, or prepared foods. This information is not intended to replace advice given to you by your health care provider. Make sure you discuss any questions you have with your health care provider. Document Released: 03/31/2002 Document Revised: 10/02/2016 Document Reviewed: 10/02/2016 Elsevier Interactive Patient Education  [employer].

## 2017-12-31 ENCOUNTER — Telehealth: Payer: Self-pay | Admitting: *Deleted

## 2017-12-31 NOTE — Telephone Encounter (Signed)
   [facility] Pre-operative Risk Assessment    Request for surgical clearance:  1. What type of surgery is being performed? Colonoscopy   2. When is this surgery scheduled? Pending Clearance  What type of clearance is required (medical clearance vs. Pharmacy clearance to hold med vs. Both)? Both  3. Are there any medications that need to be held prior to surgery and how long? Pending Clearance  4. Practice name and name of physician performing surgery? [place], Dr Carol Ada   5. What is your office phone and fax number? P#[phone], F#[phone]   6. Anesthesia type (None, local, MAC, general) ? Propofol   Ashani Pumphrey 12/31/2017, 12:37 PM  _________________________________________________________________   (provider comments below)

## 2018-01-02 NOTE — Telephone Encounter (Signed)
   Primary Cardiologist: Larae Grooms, MD  Chart reviewed as part of pre-operative protocol coverage. Patient was contacted 01/02/2018 in reference to pre-operative risk assessment for pending surgery as outlined below.  Gregory [name] was last seen on  12/24/17  by Daune Perch, NP.  She was doing well on cardiac standpoint.  Therefore, based on ACC/AHA guidelines, the patient would be at acceptable risk for the planned procedure without further cardiovascular testing.   Dr. Irish Lack, can you comment on Plavix. Please forward your response to P [facility] PREOP. Thank you   Leanor Kail, PA 01/02/2018, 2:59 PM

## 2018-01-03 NOTE — Telephone Encounter (Signed)
   Pt cleared for colonoscopy. Per Dr. Irish Lack, ok to hold Plavix 5 days prior to procedure.    I will route info to requesting MD and will remove from preop pool.

## 2018-01-03 NOTE — Telephone Encounter (Signed)
OK to hold Plavix 5 days before colonoscopy. 

## 2018-01-14 ENCOUNTER — Telehealth: Payer: Self-pay | Admitting: Interventional Cardiology

## 2018-01-14 NOTE — Telephone Encounter (Signed)
Walk In Pt Form-Pt needs letter for [employer]. Placed in Dr. Irish Lack doc box.

## 2018-01-15 DIAGNOSIS — D123 Benign neoplasm of transverse colon: Secondary | ICD-10-CM | POA: Diagnosis not present

## 2018-01-15 DIAGNOSIS — Z86010 Personal history of colonic polyps: Secondary | ICD-10-CM | POA: Diagnosis not present

## 2018-01-15 DIAGNOSIS — K5730 Diverticulosis of large intestine without perforation or abscess without bleeding: Secondary | ICD-10-CM | POA: Diagnosis not present

## 2018-01-15 DIAGNOSIS — K635 Polyp of colon: Secondary | ICD-10-CM | POA: Diagnosis not present

## 2018-01-16 NOTE — Telephone Encounter (Signed)
Letter for [employer] created and left up front for patient to pick up. Left message for patient to call back.

## 2018-01-17 NOTE — Telephone Encounter (Signed)
Patient picked up letter for [employer].

## 2018-01-21 ENCOUNTER — Telehealth: Payer: Self-pay | Admitting: Interventional Cardiology

## 2018-01-21 MED FILL — JANUVIA 100 MG TABLET: 100 | 30 days supply | Qty: 30 | Fill #4

## 2018-01-21 MED FILL — metFORMIN HCL 1000 MG TABS: 1000 | 90 days supply | Qty: 180 | Fill #0

## 2018-01-21 MED FILL — METOPROLOL TARTRATE 100 MG: 100 | 30 days supply | Qty: 60 | Fill #4

## 2018-01-21 NOTE — Telephone Encounter (Signed)
New message     Pt came in today about phone call from last week. Please call pt.

## 2018-01-21 NOTE — Telephone Encounter (Signed)
Returned call to patient. Patient just checked his VM and received the message to call back from 3/27. Made patient aware that the message was to let him know that his letter for jury duty was ready to pick up. Patient states that he received the letter and thanked me for the call.

## 2018-01-22 MED FILL — ALLOPURINOL 100 MG TABLET: 100 | 30 days supply | Qty: 90 | Fill #0

## 2018-02-18 MED FILL — METOPROLOL TARTRATE 100 MG: 100 | 30 days supply | Qty: 60 | Fill #5

## 2018-02-18 MED FILL — ALLOPURINOL 100 MG TABLET: 100 | 30 days supply | Qty: 90 | Fill #1

## 2018-02-18 MED FILL — CLOPIDOGREL 75 MG TABLET: 75 | 90 days supply | Qty: 90 | Fill #1

## 2018-02-18 MED FILL — JANUVIA 100 MG TABLET: 100 | 30 days supply | Qty: 30 | Fill #5

## 2018-03-01 MED FILL — LANTUS SOLOSTAR 100 UNITS/M: 100 | 60 days supply | Qty: 15 | Fill #2

## 2018-03-04 DIAGNOSIS — M1000 Idiopathic gout, unspecified site: Secondary | ICD-10-CM | POA: Diagnosis not present

## 2018-03-04 DIAGNOSIS — I2510 Atherosclerotic heart disease of native coronary artery without angina pectoris: Secondary | ICD-10-CM | POA: Diagnosis not present

## 2018-03-04 DIAGNOSIS — I10 Essential (primary) hypertension: Secondary | ICD-10-CM | POA: Diagnosis not present

## 2018-03-04 DIAGNOSIS — C61 Malignant neoplasm of prostate: Secondary | ICD-10-CM | POA: Diagnosis not present

## 2018-03-04 DIAGNOSIS — Z1389 Encounter for screening for other disorder: Secondary | ICD-10-CM | POA: Diagnosis not present

## 2018-03-04 DIAGNOSIS — H6122 Impacted cerumen, left ear: Secondary | ICD-10-CM | POA: Diagnosis not present

## 2018-03-04 DIAGNOSIS — Z0000 Encounter for general adult medical examination without abnormal findings: Secondary | ICD-10-CM | POA: Diagnosis not present

## 2018-03-04 DIAGNOSIS — Z1159 Encounter for screening for other viral diseases: Secondary | ICD-10-CM | POA: Diagnosis not present

## 2018-03-04 DIAGNOSIS — E1122 Type 2 diabetes mellitus with diabetic chronic kidney disease: Secondary | ICD-10-CM | POA: Diagnosis not present

## 2018-03-04 DIAGNOSIS — Z135 Encounter for screening for eye and ear disorders: Secondary | ICD-10-CM | POA: Diagnosis not present

## 2018-03-04 DIAGNOSIS — E7800 Pure hypercholesterolemia, unspecified: Secondary | ICD-10-CM | POA: Diagnosis not present

## 2018-03-04 DIAGNOSIS — Z7189 Other specified counseling: Secondary | ICD-10-CM | POA: Diagnosis not present

## 2018-03-11 MED FILL — AMLODIPINE-VALSARTAN 10-320: 10-320 | 90 days supply | Qty: 90 | Fill #1

## 2018-03-21 MED FILL — FUROSEMIDE 40 MG TAB: 40 | 90 days supply | Qty: 90 | Fill #2

## 2018-03-28 MED FILL — JANUVIA 100 MG TABLET: 100 | 30 days supply | Qty: 30 | Fill #6

## 2018-03-28 MED FILL — ATORVASTATIN 10 MG TABLET: 10 | 90 days supply | Qty: 90 | Fill #2

## 2018-03-28 MED FILL — METOPROLOL TARTRATE 100 MG: 100 | 30 days supply | Qty: 60 | Fill #6

## 2018-04-01 MED FILL — ALLOPURINOL 300 MG TABLET: 300 | 90 days supply | Qty: 90 | Fill #0

## 2018-05-06 MED FILL — JANUVIA 100 MG TABLET: 100 | 30 days supply | Qty: 30 | Fill #7

## 2018-05-06 MED FILL — LANTUS SOLOSTAR 100 UNITS/M: 100 | 60 days supply | Qty: 15 | Fill #3

## 2018-05-06 MED FILL — metFORMIN HCL 1000 MG TABS: 1000 | 90 days supply | Qty: 180 | Fill #1

## 2018-05-06 MED FILL — METOPROLOL TARTRATE 100 MG: 100 | 30 days supply | Qty: 60 | Fill #7

## 2018-06-13 ENCOUNTER — Encounter: Payer: Self-pay | Admitting: Interventional Cardiology

## 2018-06-26 MED FILL — ATORVASTATIN 10 MG TABLET: 10 | 90 days supply | Qty: 90 | Fill #3

## 2018-06-26 MED FILL — ALLOPURINOL 300 MG TABS: 300 | 90 days supply | Qty: 90 | Fill #1

## 2018-06-26 MED FILL — JANUVIA 100 MG TABLET: 100 | 30 days supply | Qty: 30 | Fill #8

## 2018-06-26 MED FILL — AMLODIPINE-VALSARTAN 10-320: 10-320 | 90 days supply | Qty: 90 | Fill #2

## 2018-06-26 MED FILL — FUROSEMIDE 40 MG TAB: 40 | 90 days supply | Qty: 90 | Fill #3

## 2018-06-26 MED FILL — CLOPIDOGREL 75 MG TABLET: 75 | 90 days supply | Qty: 90 | Fill #2

## 2018-06-26 MED FILL — METOPROLOL TARTRATE 100 MG: 100 | 30 days supply | Qty: 60 | Fill #8

## 2018-07-05 NOTE — Progress Notes (Deleted)
Cardiology Office Note   Date:  07/05/2018   ID:  Gregory [name], DOB April 01, 1951, MRN [id number]  PCP:  Seward Carol, MD    No chief complaint on file.    Wt Readings from Last 3 Encounters:  12/24/17 243 lb (110.2 kg)  08/21/17 241 lb 3.2 oz (109.4 kg)  09/20/15 230 lb (104.3 kg)       History of Present Illness: Gregory [name] is a 67 y.o. male  with a past medical history significant for CAD with overlapping stents to LAD 05/2012, hyperlipidemia, hypertension, CKD stage III, diabetes mellitus type 2. The patient has a history of a prolonged run of nonsustained ventricular tachycardia during the hospitalization for pneumonia in 01/2015.  He was asymptomatic with no recurrence and his beta-blocker was increased.  There was no syncope.  In 07/2017, he was complaining of some dyspnea on exertion as well as lower extremity edema.  A BNP was checked and was 31.  Lasix 40 mg daily was added.  Left leg is chronically edematous due to prior achilles tendon tear. Has been evaluated in the past and negative for clot per pt.     Past Medical History:  Diagnosis Date  . Arthritis   . Cancer ([place])   . Coronary artery disease   . Gout   . Hyperlipidemia   . Hypertension   . Myocardial infarction ([place]) 05/2012   "mild"  . Swelling of left lower extremity    LLE; "happens often"  . Type II diabetes mellitus ([place])     Past Surgical History:  Procedure Laterality Date  . BACK SURGERY    . CORONARY ANGIOPLASTY WITH STENT PLACEMENT  06/05/2012   "2; total of 2"  . LEFT HEART CATHETERIZATION WITH CORONARY ANGIOGRAM N/A 06/05/2012   Procedure: LEFT HEART CATHETERIZATION WITH CORONARY ANGIOGRAM;  Surgeon: Jettie Booze, MD;  Location: [facility] CATH LAB;  Service: Cardiovascular;  Laterality: N/A;  possible PCI  . LUMBAR DISC SURGERY  1990's  . LYMPHADENECTOMY Bilateral 02/25/2014   Procedure: [facility] LYMPH NODE DISSECTION";  Surgeon: Molli Hazard, MD;   Location: [facility] ORS;  Service: Urology;  Laterality: Bilateral;  . PROSTATE SURGERY    . ROBOT ASSISTED LAPAROSCOPIC RADICAL PROSTATECTOMY N/A 02/25/2014   Procedure: ROBOTIC ASSISTED LAPAROSCOPIC RADICAL  PROSTATECTOMY,  UMBILICAL HERNIA REPAIR;  Surgeon: Molli Hazard, MD;  Location: [facility] ORS;  Service: Urology;  Laterality: N/A;     Current Outpatient Medications  Medication Sig Dispense Refill  . allopurinol (ZYLOPRIM) 100 MG tablet Take 300 mg by mouth daily as needed (gout).     Marland Kitchen amLODipine-valsartan (EXFORGE) 10-320 MG per tablet Take 1 tablet by mouth daily.    Marland Kitchen atorvastatin (LIPITOR) 10 MG tablet Take 10 mg by mouth daily at 6 PM.   1  . clopidogrel (PLAVIX) 75 MG tablet TAKE 1 TABLET (75 MG TOTAL) BY MOUTH DAILY. 90 tablet 0  . furosemide (LASIX) 40 MG tablet Take 1 tablet (40 mg total) by mouth daily. 90 tablet 3  . Insulin Glargine (BASAGLAR KWIKPEN) 100 UNIT/ML SOPN Inject 25 Units into the skin daily.  11  . insulin glargine (LANTUS) 100 UNIT/ML injection Inject 25 Units into the skin every morning.     Marland Kitchen JANUVIA 100 MG tablet Take 100 mg by mouth daily.  4  . metFORMIN (GLUCOPHAGE) 1000 MG tablet Take 1,000 mg by mouth 2 (two) times daily with a meal.    . metoprolol (LOPRESSOR) 100 MG tablet Take  1 tablet (100 mg total) by mouth 2 (two) times daily. 60 tablet 3  . nitroGLYCERIN (NITROSTAT) 0.4 MG SL tablet Place 1 tablet (0.4 mg total) under the tongue every 5 (five) minutes as needed for chest pain. 25 tablet 3   No current facility-administered medications for this visit.     Allergies:   Patient has no known allergies.    Social History:  The patient  reports that he quit smoking about 24 years ago. His smoking use included cigarettes. He has a 60.00 pack-year smoking history. He has never used smokeless tobacco. He reports that he does not drink alcohol or use drugs.   Family History:  The patient's ***family history includes Asthma in his father; Breast cancer in  his mother; Parkinson's disease in his father.    ROS:  Please see the history of present illness.   Otherwise, review of systems are positive for ***.   All other systems are reviewed and negative.    PHYSICAL EXAM: VS:  There were no vitals taken for this visit. , BMI There is no height or weight on file to calculate BMI. GEN: Well nourished, well developed, in no acute distress HEENT: normal Neck: no JVD, carotid bruits, or masses Cardiac: ***RRR; no murmurs, rubs, or gallops,no edema  Respiratory:  clear to auscultation bilaterally, normal work of breathing GI: soft, nontender, nondistended, + BS MS: no deformity or atrophy Skin: warm and dry, no rash Neuro:  Strength and sensation are intact Psych: euthymic mood, full affect   EKG:   The ekg ordered today demonstrates ***   Recent Labs: 08/27/2017: BUN 27; Creatinine, Ser 1.74; NT-Pro BNP 31; Potassium 4.0; Sodium 137   Lipid Panel No results found for: CHOL, TRIG, HDL, CHOLHDL, VLDL, LDLCALC, LDLDIRECT   Other studies Reviewed: Additional studies/ records that were reviewed today with results demonstrating: ***.   ASSESSMENT AND PLAN:  1. CAD:  2. HTN: 3. Hyperlipidemia: 4. CKD3:   Current medicines are reviewed at length with the patient today.  The patient concerns regarding his medicines were addressed.  The following changes have been made:  No change***  Labs/ tests ordered today include: *** No orders of the defined types were placed in this encounter.   Recommend 150 minutes/week of aerobic exercise Low fat, low carb, high fiber diet recommended  Disposition:   FU in ***   Signed, Larae Grooms, MD  07/05/2018 5:43 PM    [facility] [address], [place], [place]  [address] Phone: [phone]; Fax: [phone]

## 2018-07-08 ENCOUNTER — Ambulatory Visit: Payer: Medicare HMO | Admitting: Interventional Cardiology

## 2018-07-08 MED FILL — LANTUS SOLOSTAR 100 UNITS/M: 100 | 60 days supply | Qty: 15 | Fill #4

## 2018-07-09 ENCOUNTER — Encounter: Payer: Self-pay | Admitting: Interventional Cardiology

## 2018-08-19 MED FILL — METOPROLOL TARTRATE 100 MG: 100 | 90 days supply | Qty: 180 | Fill #0

## 2018-09-02 DIAGNOSIS — I2510 Atherosclerotic heart disease of native coronary artery without angina pectoris: Secondary | ICD-10-CM | POA: Diagnosis not present

## 2018-09-02 DIAGNOSIS — C61 Malignant neoplasm of prostate: Secondary | ICD-10-CM | POA: Diagnosis not present

## 2018-09-02 DIAGNOSIS — I10 Essential (primary) hypertension: Secondary | ICD-10-CM | POA: Diagnosis not present

## 2018-09-02 DIAGNOSIS — E7800 Pure hypercholesterolemia, unspecified: Secondary | ICD-10-CM | POA: Diagnosis not present

## 2018-09-02 DIAGNOSIS — Z23 Encounter for immunization: Secondary | ICD-10-CM | POA: Diagnosis not present

## 2018-09-02 DIAGNOSIS — E1122 Type 2 diabetes mellitus with diabetic chronic kidney disease: Secondary | ICD-10-CM | POA: Diagnosis not present

## 2018-09-09 DIAGNOSIS — H6122 Impacted cerumen, left ear: Secondary | ICD-10-CM | POA: Diagnosis not present

## 2018-09-17 MED FILL — ALLOPURINOL 300 MG TABS: 300 | 90 days supply | Qty: 90 | Fill #2

## 2018-09-17 MED FILL — LANTUS SOLOSTAR 100 UNITS/M: 100 | 60 days supply | Qty: 15 | Fill #5

## 2018-10-31 MED FILL — JANUVIA 100 MG TABLET: 100 | 30 days supply | Qty: 30 | Fill #0

## 2018-10-31 MED FILL — AMLODIPINE-VALSARTAN 10-320: 10-320 | 90 days supply | Qty: 90 | Fill #0

## 2018-11-04 ENCOUNTER — Other Ambulatory Visit: Payer: Self-pay | Admitting: Interventional Cardiology

## 2018-11-04 MED FILL — FUROSEMIDE 40 MG TAB: 40 | 90 days supply | Qty: 90 | Fill #0

## 2018-11-04 MED FILL — CLOPIDOGREL 75 MG TABLET: 75 | 90 days supply | Qty: 90 | Fill #0

## 2018-11-04 MED FILL — ATORVASTATIN 10 MG TABLET: 10 | 90 days supply | Qty: 90 | Fill #0

## 2018-11-04 MED FILL — metFORMIN HCL 1000 MG TABS: 1000 | 90 days supply | Qty: 180 | Fill #0

## 2018-11-29 MED FILL — LANTUS SOLOSTAR 100 UNITS/M: 100 | 84 days supply | Qty: 21 | Fill #0

## 2018-12-02 DIAGNOSIS — N3946 Mixed incontinence: Secondary | ICD-10-CM | POA: Diagnosis not present

## 2018-12-02 DIAGNOSIS — N5201 Erectile dysfunction due to arterial insufficiency: Secondary | ICD-10-CM | POA: Diagnosis not present

## 2018-12-02 DIAGNOSIS — C61 Malignant neoplasm of prostate: Secondary | ICD-10-CM | POA: Diagnosis not present

## 2018-12-23 DIAGNOSIS — N3946 Mixed incontinence: Secondary | ICD-10-CM | POA: Diagnosis not present

## 2018-12-23 DIAGNOSIS — M62838 Other muscle spasm: Secondary | ICD-10-CM | POA: Diagnosis not present

## 2018-12-23 DIAGNOSIS — M6281 Muscle weakness (generalized): Secondary | ICD-10-CM | POA: Diagnosis not present

## 2018-12-23 DIAGNOSIS — Z8546 Personal history of malignant neoplasm of prostate: Secondary | ICD-10-CM | POA: Diagnosis not present

## 2018-12-23 MED FILL — JANUVIA 100 MG TABLET: 100 | 30 days supply | Qty: 30 | Fill #1 | Status: TO

## 2019-01-06 DIAGNOSIS — N3946 Mixed incontinence: Secondary | ICD-10-CM | POA: Diagnosis not present

## 2019-01-06 DIAGNOSIS — C61 Malignant neoplasm of prostate: Secondary | ICD-10-CM | POA: Diagnosis not present

## 2019-01-06 DIAGNOSIS — N5201 Erectile dysfunction due to arterial insufficiency: Secondary | ICD-10-CM | POA: Diagnosis not present

## 2019-02-21 MED FILL — JANUVIA 100 MG TABLET: 100 | 30 days supply | Qty: 30 | Fill #0 | Status: TO

## 2019-02-21 MED FILL — METOPROLOL TARTRATE 100 MG: 100 | 90 days supply | Qty: 180 | Fill #0

## 2019-02-21 MED FILL — ATORVASTATIN 10 MG TABLET: 10 | 90 days supply | Qty: 90 | Fill #0

## 2019-03-24 ENCOUNTER — Other Ambulatory Visit: Payer: Self-pay | Admitting: Interventional Cardiology

## 2019-03-24 MED FILL — LANTUS SOLOSTAR 100 UNITS/M: 100 | 84 days supply | Qty: 21 | Fill #1

## 2019-03-24 MED FILL — ALLOPURINOL 300 MG TABS: 300 | 90 days supply | Qty: 90 | Fill #3

## 2019-03-24 MED FILL — FUROSEMIDE 40 MG TAB: 40 | 30 days supply | Qty: 30 | Fill #0

## 2019-03-24 MED FILL — JANUVIA 100 MG TABLET: 100 | 30 days supply | Qty: 30 | Fill #0

## 2019-03-24 MED FILL — AMLODIPINE-VALSARTAN 10-320: 10-320 | 90 days supply | Qty: 90 | Fill #1

## 2019-03-25 MED FILL — UNIFINE PENTIPS 31GX3/16": 31G X 5 MM | 90 days supply | Qty: 100 | Fill #0

## 2019-03-25 MED FILL — UNIFINE PENTIPS 31GX3/16: 31G X 5 MM | 90 days supply | Qty: 100 | Fill #0

## 2019-04-28 ENCOUNTER — Other Ambulatory Visit: Payer: Self-pay | Admitting: Interventional Cardiology

## 2019-04-28 MED FILL — FUROSEMIDE 40 MG TAB: 40 | 30 days supply | Qty: 30 | Fill #0

## 2019-04-28 MED FILL — metFORMIN HCL 1000 MG TABS: 1000 | 90 days supply | Qty: 180 | Fill #1

## 2019-05-19 MED FILL — JANUVIA 100 MG TABLET: 100 | 30 days supply | Qty: 30 | Fill #1

## 2019-06-18 MED FILL — ATORVASTATIN 10 MG TABLET: 10 | 90 days supply | Qty: 90 | Fill #0

## 2019-07-07 ENCOUNTER — Other Ambulatory Visit: Payer: Self-pay | Admitting: Interventional Cardiology

## 2019-07-07 MED FILL — FUROSEMIDE 40 MG TAB: 40 | 30 days supply | Qty: 30 | Fill #0

## 2019-07-07 MED FILL — METOPROLOL TARTRATE 100 MG: 100 | 90 days supply | Qty: 180 | Fill #0

## 2019-07-07 MED FILL — LANTUS SOLOSTAR 100 UNITS/M: 100 | 24 days supply | Qty: 6 | Fill #2

## 2019-07-07 MED FILL — ALLOPURINOL 300 MG TABS: 300 | 90 days supply | Qty: 90 | Fill #0

## 2019-07-14 DIAGNOSIS — Z23 Encounter for immunization: Secondary | ICD-10-CM | POA: Diagnosis not present

## 2019-07-14 DIAGNOSIS — I10 Essential (primary) hypertension: Secondary | ICD-10-CM | POA: Diagnosis not present

## 2019-07-14 DIAGNOSIS — E1122 Type 2 diabetes mellitus with diabetic chronic kidney disease: Secondary | ICD-10-CM | POA: Diagnosis not present

## 2019-07-14 DIAGNOSIS — C61 Malignant neoplasm of prostate: Secondary | ICD-10-CM | POA: Diagnosis not present

## 2019-07-14 DIAGNOSIS — M1000 Idiopathic gout, unspecified site: Secondary | ICD-10-CM | POA: Diagnosis not present

## 2019-07-14 DIAGNOSIS — N182 Chronic kidney disease, stage 2 (mild): Secondary | ICD-10-CM | POA: Diagnosis not present

## 2019-07-14 DIAGNOSIS — Z794 Long term (current) use of insulin: Secondary | ICD-10-CM | POA: Diagnosis not present

## 2019-07-14 DIAGNOSIS — I2510 Atherosclerotic heart disease of native coronary artery without angina pectoris: Secondary | ICD-10-CM | POA: Diagnosis not present

## 2019-07-14 DIAGNOSIS — E7800 Pure hypercholesterolemia, unspecified: Secondary | ICD-10-CM | POA: Diagnosis not present

## 2019-07-14 MED FILL — CLOPIDOGREL 75 MG TABLET: 75 | 90 days supply | Qty: 90 | Fill #0

## 2019-07-23 DIAGNOSIS — I2510 Atherosclerotic heart disease of native coronary artery without angina pectoris: Secondary | ICD-10-CM | POA: Diagnosis not present

## 2019-07-23 DIAGNOSIS — E1122 Type 2 diabetes mellitus with diabetic chronic kidney disease: Secondary | ICD-10-CM | POA: Diagnosis not present

## 2019-07-23 DIAGNOSIS — C61 Malignant neoplasm of prostate: Secondary | ICD-10-CM | POA: Diagnosis not present

## 2019-07-23 DIAGNOSIS — E782 Mixed hyperlipidemia: Secondary | ICD-10-CM | POA: Diagnosis not present

## 2019-07-23 DIAGNOSIS — N182 Chronic kidney disease, stage 2 (mild): Secondary | ICD-10-CM | POA: Diagnosis not present

## 2019-07-23 DIAGNOSIS — E1169 Type 2 diabetes mellitus with other specified complication: Secondary | ICD-10-CM | POA: Diagnosis not present

## 2019-07-23 DIAGNOSIS — E7800 Pure hypercholesterolemia, unspecified: Secondary | ICD-10-CM | POA: Diagnosis not present

## 2019-07-23 DIAGNOSIS — I10 Essential (primary) hypertension: Secondary | ICD-10-CM | POA: Diagnosis not present

## 2019-07-29 DIAGNOSIS — C61 Malignant neoplasm of prostate: Secondary | ICD-10-CM | POA: Diagnosis not present

## 2019-08-04 MED FILL — AMLODIPINE-VALSARTAN 10-320: 10-320 | 90 days supply | Qty: 90 | Fill #2

## 2019-08-04 MED FILL — LANTUS SOLOSTAR 100 UNITS/M: 100 | 24 days supply | Qty: 6 | Fill #3

## 2019-08-05 DIAGNOSIS — N5201 Erectile dysfunction due to arterial insufficiency: Secondary | ICD-10-CM | POA: Diagnosis not present

## 2019-08-05 DIAGNOSIS — N3946 Mixed incontinence: Secondary | ICD-10-CM | POA: Diagnosis not present

## 2019-08-05 DIAGNOSIS — C61 Malignant neoplasm of prostate: Secondary | ICD-10-CM | POA: Diagnosis not present

## 2019-08-06 ENCOUNTER — Other Ambulatory Visit (HOSPITAL_COMMUNITY): Payer: Self-pay | Admitting: Urology

## 2019-08-06 DIAGNOSIS — C61 Malignant neoplasm of prostate: Secondary | ICD-10-CM

## 2019-08-18 ENCOUNTER — Encounter (HOSPITAL_COMMUNITY)
Admission: RE | Admit: 2019-08-18 | Discharge: 2019-08-18 | Disposition: A | Discharge status: Home / Self Care | Payer: Medicare HMO | Source: Ambulatory Visit | Attending: Urology | Admitting: Urology

## 2019-08-18 ENCOUNTER — Other Ambulatory Visit: Payer: Self-pay

## 2019-08-18 DIAGNOSIS — C61 Malignant neoplasm of prostate: Secondary | ICD-10-CM | POA: Diagnosis not present

## 2019-08-18 DIAGNOSIS — Z8546 Personal history of malignant neoplasm of prostate: Secondary | ICD-10-CM | POA: Diagnosis not present

## 2019-08-18 DIAGNOSIS — R160 Hepatomegaly, not elsewhere classified: Secondary | ICD-10-CM | POA: Diagnosis not present

## 2019-08-18 MED ORDER — TECHNETIUM TC 99M MEDRONATE IV KIT
20.0000 | PACK | Freq: Once | INTRAVENOUS | Status: AC | PRN
  Administered 2019-08-18: 22 via INTRAVENOUS

## 2019-09-05 DIAGNOSIS — C61 Malignant neoplasm of prostate: Secondary | ICD-10-CM | POA: Diagnosis not present

## 2019-09-05 DIAGNOSIS — E782 Mixed hyperlipidemia: Secondary | ICD-10-CM | POA: Diagnosis not present

## 2019-09-05 DIAGNOSIS — E1122 Type 2 diabetes mellitus with diabetic chronic kidney disease: Secondary | ICD-10-CM | POA: Diagnosis not present

## 2019-09-05 DIAGNOSIS — I10 Essential (primary) hypertension: Secondary | ICD-10-CM | POA: Diagnosis not present

## 2019-09-05 DIAGNOSIS — E1169 Type 2 diabetes mellitus with other specified complication: Secondary | ICD-10-CM | POA: Diagnosis not present

## 2019-09-05 DIAGNOSIS — N182 Chronic kidney disease, stage 2 (mild): Secondary | ICD-10-CM | POA: Diagnosis not present

## 2019-09-05 DIAGNOSIS — I2510 Atherosclerotic heart disease of native coronary artery without angina pectoris: Secondary | ICD-10-CM | POA: Diagnosis not present

## 2019-09-25 ENCOUNTER — Ambulatory Visit: Payer: Medicare HMO | Admitting: Interventional Cardiology

## 2019-09-26 ENCOUNTER — Telehealth: Payer: Self-pay | Admitting: Interventional Cardiology

## 2019-09-26 MED ORDER — FUROSEMIDE 40 MG PO TABS: ORAL_TABLET | ORAL | 1 refills | Status: DC

## 2019-09-26 NOTE — Telephone Encounter (Signed)
°*  STAT* If patient is at the pharmacy, call can be transferred to refill team.   1. Which medications need to be refilled? (please list name of each medication and dose if known) furosemide (LASIX) 40 MG tablet  2. Which pharmacy/location (including street and city if local pharmacy) is medication to be sent to? [facility] fax # [phone], phone # [phone]  3. Do they need a 30 day or 90 day supply? 90 day  Patient is our of medication.

## 2019-09-26 NOTE — Telephone Encounter (Signed)
Pt's medication was sent to pt's pharmacy as requested. Confirmation received.  °

## 2019-09-29 DIAGNOSIS — C775 Secondary and unspecified malignant neoplasm of intrapelvic lymph nodes: Secondary | ICD-10-CM | POA: Diagnosis not present

## 2019-09-29 DIAGNOSIS — C61 Malignant neoplasm of prostate: Secondary | ICD-10-CM | POA: Diagnosis not present

## 2019-09-29 DIAGNOSIS — N3946 Mixed incontinence: Secondary | ICD-10-CM | POA: Diagnosis not present

## 2019-09-29 DIAGNOSIS — R8279 Other abnormal findings on microbiological examination of urine: Secondary | ICD-10-CM | POA: Diagnosis not present

## 2019-10-13 DIAGNOSIS — N3946 Mixed incontinence: Secondary | ICD-10-CM | POA: Diagnosis not present

## 2019-10-13 DIAGNOSIS — N5201 Erectile dysfunction due to arterial insufficiency: Secondary | ICD-10-CM | POA: Diagnosis not present

## 2019-10-13 DIAGNOSIS — C61 Malignant neoplasm of prostate: Secondary | ICD-10-CM | POA: Diagnosis not present

## 2019-10-13 DIAGNOSIS — C775 Secondary and unspecified malignant neoplasm of intrapelvic lymph nodes: Secondary | ICD-10-CM | POA: Diagnosis not present

## 2019-10-21 ENCOUNTER — Encounter (HOSPITAL_COMMUNITY): Payer: Self-pay | Admitting: Emergency Medicine

## 2019-10-21 ENCOUNTER — Ambulatory Visit (INDEPENDENT_AMBULATORY_CARE_PROVIDER_SITE_OTHER): Payer: Medicare HMO

## 2019-10-21 ENCOUNTER — Ambulatory Visit (HOSPITAL_COMMUNITY)
Admission: EM | Admit: 2019-10-21 | Discharge: 2019-10-21 | Disposition: A | Discharge status: Home / Self Care | Payer: Medicare HMO | Source: Home / Self Care | Attending: Family Medicine | Admitting: Family Medicine

## 2019-10-21 ENCOUNTER — Other Ambulatory Visit: Payer: Self-pay

## 2019-10-21 ENCOUNTER — Inpatient Hospital Stay (HOSPITAL_COMMUNITY)
Admission: EM | Admit: 2019-10-21 | Discharge: 2019-10-26 | DRG: 177 | Disposition: A | Discharge status: Home Health | Payer: Medicare HMO | Attending: Internal Medicine | Admitting: Internal Medicine

## 2019-10-21 ENCOUNTER — Encounter (HOSPITAL_COMMUNITY): Payer: Self-pay

## 2019-10-21 DIAGNOSIS — IMO0002 Reserved for concepts with insufficient information to code with codable children: Secondary | ICD-10-CM | POA: Diagnosis present

## 2019-10-21 DIAGNOSIS — I2510 Atherosclerotic heart disease of native coronary artery without angina pectoris: Secondary | ICD-10-CM | POA: Diagnosis not present

## 2019-10-21 DIAGNOSIS — I472 Ventricular tachycardia: Secondary | ICD-10-CM | POA: Diagnosis not present

## 2019-10-21 DIAGNOSIS — E875 Hyperkalemia: Secondary | ICD-10-CM | POA: Diagnosis not present

## 2019-10-21 DIAGNOSIS — E1122 Type 2 diabetes mellitus with diabetic chronic kidney disease: Secondary | ICD-10-CM | POA: Diagnosis not present

## 2019-10-21 DIAGNOSIS — J189 Pneumonia, unspecified organism: Secondary | ICD-10-CM

## 2019-10-21 DIAGNOSIS — J984 Other disorders of lung: Secondary | ICD-10-CM | POA: Diagnosis not present

## 2019-10-21 DIAGNOSIS — J1289 Other viral pneumonia: Secondary | ICD-10-CM | POA: Diagnosis not present

## 2019-10-21 DIAGNOSIS — U071 COVID-19: Principal | ICD-10-CM | POA: Insufficient documentation

## 2019-10-21 DIAGNOSIS — N183 Chronic kidney disease, stage 3 unspecified: Secondary | ICD-10-CM | POA: Diagnosis present

## 2019-10-21 DIAGNOSIS — Z8546 Personal history of malignant neoplasm of prostate: Secondary | ICD-10-CM

## 2019-10-21 DIAGNOSIS — E785 Hyperlipidemia, unspecified: Secondary | ICD-10-CM | POA: Diagnosis present

## 2019-10-21 DIAGNOSIS — R0902 Hypoxemia: Secondary | ICD-10-CM

## 2019-10-21 DIAGNOSIS — J1282 Pneumonia due to coronavirus disease 2019: Secondary | ICD-10-CM | POA: Diagnosis not present

## 2019-10-21 DIAGNOSIS — Z87891 Personal history of nicotine dependence: Secondary | ICD-10-CM

## 2019-10-21 DIAGNOSIS — I10 Essential (primary) hypertension: Secondary | ICD-10-CM | POA: Diagnosis present

## 2019-10-21 DIAGNOSIS — J9601 Acute respiratory failure with hypoxia: Secondary | ICD-10-CM | POA: Diagnosis not present

## 2019-10-21 DIAGNOSIS — M109 Gout, unspecified: Secondary | ICD-10-CM | POA: Diagnosis present

## 2019-10-21 DIAGNOSIS — C61 Malignant neoplasm of prostate: Secondary | ICD-10-CM | POA: Diagnosis not present

## 2019-10-21 DIAGNOSIS — J9600 Acute respiratory failure, unspecified whether with hypoxia or hypercapnia: Secondary | ICD-10-CM | POA: Diagnosis not present

## 2019-10-21 DIAGNOSIS — I5032 Chronic diastolic (congestive) heart failure: Secondary | ICD-10-CM | POA: Diagnosis not present

## 2019-10-21 DIAGNOSIS — I5022 Chronic systolic (congestive) heart failure: Secondary | ICD-10-CM | POA: Diagnosis not present

## 2019-10-21 DIAGNOSIS — I130 Hypertensive heart and chronic kidney disease with heart failure and stage 1 through stage 4 chronic kidney disease, or unspecified chronic kidney disease: Secondary | ICD-10-CM | POA: Diagnosis not present

## 2019-10-21 DIAGNOSIS — I252 Old myocardial infarction: Secondary | ICD-10-CM | POA: Diagnosis not present

## 2019-10-21 DIAGNOSIS — Z79899 Other long term (current) drug therapy: Secondary | ICD-10-CM | POA: Diagnosis not present

## 2019-10-21 DIAGNOSIS — N1831 Chronic kidney disease, stage 3a: Secondary | ICD-10-CM | POA: Diagnosis present

## 2019-10-21 DIAGNOSIS — N189 Chronic kidney disease, unspecified: Secondary | ICD-10-CM | POA: Diagnosis not present

## 2019-10-21 DIAGNOSIS — N179 Acute kidney failure, unspecified: Secondary | ICD-10-CM | POA: Diagnosis not present

## 2019-10-21 DIAGNOSIS — R0602 Shortness of breath: Secondary | ICD-10-CM | POA: Diagnosis not present

## 2019-10-21 DIAGNOSIS — E1165 Type 2 diabetes mellitus with hyperglycemia: Secondary | ICD-10-CM | POA: Diagnosis not present

## 2019-10-21 DIAGNOSIS — Z955 Presence of coronary angioplasty implant and graft: Secondary | ICD-10-CM | POA: Diagnosis not present

## 2019-10-21 DIAGNOSIS — Z794 Long term (current) use of insulin: Secondary | ICD-10-CM

## 2019-10-21 DIAGNOSIS — E782 Mixed hyperlipidemia: Secondary | ICD-10-CM | POA: Diagnosis not present

## 2019-10-21 DIAGNOSIS — N182 Chronic kidney disease, stage 2 (mild): Secondary | ICD-10-CM | POA: Diagnosis not present

## 2019-10-21 DIAGNOSIS — E1169 Type 2 diabetes mellitus with other specified complication: Secondary | ICD-10-CM | POA: Diagnosis not present

## 2019-10-21 LAB — COMPREHENSIVE METABOLIC PANEL
ALT: 18 U/L (ref 0–44)
AST: 24 U/L (ref 15–41)
Albumin: 3.6 g/dL (ref 3.5–5.0)
Alkaline Phosphatase: 52 U/L (ref 38–126)
Anion gap: 12 (ref 5–15)
BUN: 42 mg/dL — ABNORMAL HIGH (ref 8–23)
CO2: 25 mmol/L (ref 22–32)
Calcium: 8.4 mg/dL — ABNORMAL LOW (ref 8.9–10.3)
Chloride: 101 mmol/L (ref 98–111)
Creatinine, Ser: 2.49 mg/dL — ABNORMAL HIGH (ref 0.61–1.24)
GFR calc Af Amer: 29 mL/min — ABNORMAL LOW (ref >60)
GFR calc non Af Amer: 25 mL/min — ABNORMAL LOW (ref >60)
Glucose, Bld: 123 mg/dL — ABNORMAL HIGH (ref 70–99)
Potassium: 4.8 mmol/L (ref 3.5–5.1)
Sodium: 138 mmol/L (ref 135–145)
Total Bilirubin: 0.7 mg/dL (ref 0.3–1.2)
Total Protein: 6.8 g/dL (ref 6.5–8.1)

## 2019-10-21 LAB — CBC WITH DIFFERENTIAL/PLATELET
Abs Immature Granulocytes: 0.01 10*3/uL (ref 0.00–0.07)
Basophils Absolute: 0 10*3/uL (ref 0.0–0.1)
Basophils Relative: 0 %
Eosinophils Absolute: 0 10*3/uL (ref 0.0–0.5)
Eosinophils Relative: 0 %
HCT: 51.3 % (ref 39.0–52.0)
Hemoglobin: 16.9 g/dL (ref 13.0–17.0)
Immature Granulocytes: 0 %
Lymphocytes Relative: 16 %
Lymphs Abs: 0.8 10*3/uL (ref 0.7–4.0)
MCH: 31 pg (ref 26.0–34.0)
MCHC: 32.9 g/dL (ref 30.0–36.0)
MCV: 94 fL (ref 80.0–100.0)
Monocytes Absolute: 0.6 10*3/uL (ref 0.1–1.0)
Monocytes Relative: 11 %
Neutro Abs: 3.7 10*3/uL (ref 1.7–7.7)
Neutrophils Relative %: 73 %
Platelets: 148 10*3/uL — ABNORMAL LOW (ref 150–400)
RBC: 5.46 MIL/uL (ref 4.22–5.81)
RDW: 13.2 % (ref 11.5–15.5)
WBC: 5 10*3/uL (ref 4.0–10.5)
nRBC: 0 % (ref 0.0–0.2)

## 2019-10-21 LAB — PROCALCITONIN: Procalcitonin: 0.21 ng/mL

## 2019-10-21 LAB — D-DIMER, QUANTITATIVE: D-Dimer, Quant: 0.66 ug/mL-FEU — ABNORMAL HIGH (ref 0.00–0.50)

## 2019-10-21 LAB — FERRITIN: Ferritin: 226 ng/mL (ref 24–336)

## 2019-10-21 LAB — LACTATE DEHYDROGENASE: LDH: 215 U/L — ABNORMAL HIGH (ref 98–192)

## 2019-10-21 LAB — FIBRINOGEN: Fibrinogen: 631 mg/dL — ABNORMAL HIGH (ref 210–475)

## 2019-10-21 LAB — TRIGLYCERIDES: Triglycerides: 136 mg/dL (ref <150)

## 2019-10-21 LAB — C-REACTIVE PROTEIN: CRP: 4.8 mg/dL — ABNORMAL HIGH (ref <1.0)

## 2019-10-21 LAB — POC SARS CORONAVIRUS 2 AG -  ED: SARS Coronavirus 2 Ag: POSITIVE — AB

## 2019-10-21 LAB — LACTIC ACID, PLASMA: Lactic Acid, Venous: 1.1 mmol/L (ref 0.5–1.9)

## 2019-10-21 LAB — ABO/RH: ABO/RH(D): A POS

## 2019-10-21 MED ORDER — ADULT MULTIVITAMIN W/MINERALS CH
1.0000 | ORAL_TABLET | Freq: Every day | ORAL | Status: DC
  Administered 2019-10-21 – 2019-10-26 (×7): 1 via ORAL
  Filled 2019-10-21 (×7): qty 1

## 2019-10-21 MED ORDER — SODIUM CHLORIDE 0.9 % IV SOLN: 200.0000 mg | Freq: Once | INTRAVENOUS | Status: DC

## 2019-10-21 MED ORDER — ALBUTEROL SULFATE HFA 108 (90 BASE) MCG/ACT IN AERS
2.0000 | INHALATION_SPRAY | Freq: Once | RESPIRATORY_TRACT | Status: AC
  Administered 2019-10-21: 17:00:00 2 via RESPIRATORY_TRACT
  Filled 2019-10-21: qty 6.7

## 2019-10-21 MED ORDER — ASCORBIC ACID 500 MG PO TABS
500.0000 mg | ORAL_TABLET | Freq: Every day | ORAL | Status: DC
  Administered 2019-10-21 – 2019-10-26 (×6): 500 mg via ORAL
  Filled 2019-10-21 (×6): qty 1

## 2019-10-21 MED ORDER — ENOXAPARIN SODIUM 40 MG/0.4ML ~~LOC~~ SOLN
40.0000 mg | SUBCUTANEOUS | Status: DC
  Administered 2019-10-22 – 2019-10-26 (×5): 40 mg via SUBCUTANEOUS
  Filled 2019-10-21 (×5): qty 0.4

## 2019-10-21 MED ORDER — AEROCHAMBER PLUS FLO-VU MEDIUM MISC
1.0000 | Freq: Once | Status: DC
  Filled 2019-10-21: qty 1

## 2019-10-21 MED ORDER — DEXAMETHASONE SODIUM PHOSPHATE 10 MG/ML IJ SOLN
6.0000 mg | Freq: Once | INTRAMUSCULAR | Status: AC
  Administered 2019-10-21: 17:00:00 6 mg via INTRAVENOUS
  Filled 2019-10-21: qty 1

## 2019-10-21 MED ORDER — DEXAMETHASONE SODIUM PHOSPHATE 10 MG/ML IJ SOLN
6.0000 mg | INTRAMUSCULAR | Status: DC
  Administered 2019-10-21 – 2019-10-25 (×5): 6 mg via INTRAVENOUS
  Filled 2019-10-21 (×5): qty 1

## 2019-10-21 MED ORDER — THIAMINE HCL 100 MG PO TABS
100.0000 mg | ORAL_TABLET | Freq: Every day | ORAL | Status: DC
  Administered 2019-10-21 – 2019-10-26 (×6): 100 mg via ORAL
  Filled 2019-10-21 (×6): qty 1

## 2019-10-21 MED ORDER — SODIUM CHLORIDE 0.9 % IV SOLN
200.0000 mg | Freq: Once | INTRAVENOUS | Status: AC
  Administered 2019-10-21: 22:00:00 200 mg via INTRAVENOUS
  Filled 2019-10-21: qty 40

## 2019-10-21 MED ORDER — IPRATROPIUM-ALBUTEROL 20-100 MCG/ACT IN AERS
1.0000 | INHALATION_SPRAY | Freq: Four times a day (QID) | RESPIRATORY_TRACT | Status: DC | PRN
  Filled 2019-10-21: qty 4

## 2019-10-21 MED ORDER — FOLIC ACID 1 MG PO TABS
1.0000 mg | ORAL_TABLET | Freq: Every day | ORAL | Status: DC
  Administered 2019-10-21 – 2019-10-26 (×6): 1 mg via ORAL
  Filled 2019-10-21 (×6): qty 1

## 2019-10-21 MED ORDER — SODIUM CHLORIDE 0.9 % IV SOLN
100.0000 mg | Freq: Every day | INTRAVENOUS | Status: AC
  Administered 2019-10-22 – 2019-10-25 (×4): 100 mg via INTRAVENOUS
  Filled 2019-10-21 (×4): qty 20

## 2019-10-21 MED ORDER — ZINC SULFATE 220 (50 ZN) MG PO CAPS
220.0000 mg | ORAL_CAPSULE | Freq: Every day | ORAL | Status: DC
  Administered 2019-10-21 – 2019-10-26 (×6): 220 mg via ORAL
  Filled 2019-10-21 (×5): qty 1

## 2019-10-21 MED ORDER — SODIUM CHLORIDE 0.9 % IV SOLN: 100.0000 mg | Freq: Every day | INTRAVENOUS | Status: DC

## 2019-10-21 NOTE — ED Provider Notes (Signed)
Gregory [name]   [id number] 10/21/19 Arrival Time: [id number]  ASSESSMENT & PLAN:  1. COVID-19 virus infection   2. Pneumonia due to COVID-19 virus   3. Hypoxemia     D/C to ED for further evaluation and treatment.  Here his is hovering around 90-91% on RA; at times SpO2 does fall to ~85% after he has been speaking. I have personally viewed the imaging studies ordered this visit. Bilateral patchy areas. Likely PNA associated with COVID.   Reviewed expectations re: course of current medical issues. Questions answered. Outlined signs and symptoms indicating need for more acute intervention. Patient verbalized understanding. After Visit Summary given.   SUBJECTIVE: History from: patient.  Gregory [name] is a 68 y.o. male with HTN, DM, and CAD who presents with complaint of nasal congestion, post-nasal drainage, and a persistent dry cough. Onset abrupt, approx 4 d ago; with mild fatigue and with body aches. SOB: mild at times "but not really that much". Wheezing: none. Fever: denies. Overall normal PO intake without n/v. Known sick contacts or COVID-19 exposure: yes, co-worker recently tested positive. No specific or significant aggravating or alleviating factors reported. Ambulatory without difficulty. No LE edema. No associated CP. OTC treatment: "cold medicines" without much relief.   Social History   Tobacco Use  Smoking Status Former Smoker  . Packs/day: 2.00  . Years: 30.00  . Pack years: 60.00  . Types: Cigarettes  . Quit date: 10/23/1993  . Years since quitting: 26.0  Smokeless Tobacco Never Used    ROS: As per HPI. All other systems negative.    OBJECTIVE:  Vitals:   10/21/19 1039 10/21/19 1042  BP: 116/72   Pulse: 72   Resp: (!) 22   Temp: 98.3 F (36.8 C)   TempSrc: Oral   SpO2: (!) 89% 93%    Recheck RR: 18  General appearance: alert; NAD HEENT: nasal congestion; clear runny nose; throat irritation secondary to post-nasal drainage Neck:  supple without LAD CV: RRR Lungs: slight tachypnea at times; unlabored respirations, symmetrical air entry without appreciable wheezing; cough: mild, dry; is able to speak full sentences without difficulty Abd: soft; non-tender Ext: no LE edema; normal ROM Skin: warm and dry Neuro: normal gait Psychological: alert and cooperative; normal mood and affect  Imaging: DG Chest 2 View  Result Date: 10/21/2019 CLINICAL DATA:  Short of breath.  COVID-19 positive EXAM: CHEST - 2 VIEW COMPARISON:  02/22/2015 FINDINGS: Heart size and vascularity normal Streaky lung markings are present in the bases bilaterally. These were not present on the prior study. No effusion. IMPRESSION: Patchy bibasilar airspace disease. Probable pneumonia given the COVID-19 history. Electronically Signed   By: Franchot Gallo M.D.   On: 10/21/2019 11:54    No Known Allergies  Past Medical History:  Diagnosis Date  . Arthritis   . Cancer ([place])   . Coronary artery disease   . Gout   . Hyperlipidemia   . Hypertension   . Myocardial infarction ([place]) 05/2012   "mild"  . Swelling of left lower extremity    LLE; "happens often"  . Type II diabetes mellitus (H[facility])    Family History  Problem Relation Age of Onset  . Breast cancer Mother   . Asthma Father   . Parkinson's disease Father    Social History   Socioeconomic History  . Marital status: Married    Spouse name: Not on file  . Number of children: Not on file  . Years of education: Not on file  .  Highest education level: Not on file  Occupational History  . Not on file  Tobacco Use  . Smoking status: Former Smoker    Packs/day: 2.00    Years: 30.00    Pack years: 60.00    Types: Cigarettes    Quit date: 10/23/1993    Years since quitting: 26.0  . Smokeless tobacco: Never Used  Substance and Sexual Activity  . Alcohol use: No  . Drug use: No  . Sexual activity: Not Currently  Other Topics Concern  . Not on file  Social History Narrative  . Not on  file   Social Determinants of Health   Financial Resource Strain:   . Difficulty of Paying Living Expenses: Not on file  Food Insecurity:   . Worried About Charity fundraiser in the Last Year: Not on file  . Ran Out of Food in the Last Year: Not on file  Transportation Needs:   . Lack of Transportation (Medical): Not on file  . Lack of Transportation (Non-Medical): Not on file  Physical Activity:   . Days of Exercise per Week: Not on file  . Minutes of Exercise per Session: Not on file  Stress:   . Feeling of Stress : Not on file  Social Connections:   . Frequency of Communication with Friends and Family: Not on file  . Frequency of Social Gatherings with Friends and Family: Not on file  . Attends Religious Services: Not on file  . Active Member of Clubs or Organizations: Not on file  . Attends Archivist Meetings: Not on file  . Marital Status: Not on file  Intimate Partner Violence:   . Fear of Current or Ex-Partner: Not on file  . Emotionally Abused: Not on file  . Physically Abused: Not on file  . Sexually Abused: Not on file           Vanessa Kick, MD 10/21/19 1222

## 2019-10-21 NOTE — ED Notes (Signed)
Spoke to [employer], emt at triage about patient.

## 2019-10-21 NOTE — ED Triage Notes (Signed)
Pt c/o runny nose, cough x 4days, body aches; confusion "got turned around" this morning. Denies HA, or N/v/d, denies fever. Taking OTC for cough/fever. 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 SPO2 averaging 91% continuous on RA

## 2019-10-21 NOTE — ED Notes (Signed)
Rapid Covid Test: positive Reported results to the provider

## 2019-10-21 NOTE — ED Notes (Signed)
Patient is being discharged from the [facility] and sent to the Emergency Department via wheelchair by staff. Per dr hagler, patient is stable but in need of higher level of care due to positive covid, pneumonia, hypoxemia. Patient is aware and verbalizes understanding of plan of care.  Vitals:   10/21/19 1039 10/21/19 1042  BP: 116/72   Pulse: 72   Resp: (!) 22   Temp: 98.3 F (36.8 C)   SpO2: (!) 89% 93%

## 2019-10-21 NOTE — ED Triage Notes (Signed)
Pt states took OTC fever/cough medicine at 0800 today.

## 2019-10-21 NOTE — ED Provider Notes (Signed)
[facility] EMERGENCY DEPARTMENT Provider Note   CSN: [id number] Arrival date & time: 10/21/19  1252     History Chief Complaint  Patient presents with  . COVID +  . Cough    Gregory [name] is a 68 y.o. male with a hx of MI, CAD, HTN, hyperlipidemia, T2DM, & CHF who presents to the ED from urgent care due to positive COVID 19 testing with hypoxemia.  Patient reports he has had symptoms including nasal congestion, dry cough, and chills for the past 4 days.  No alleviating or aggravating factors.  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  Went to urgent care today to be evaluated, was found to be hypoxic with a positive Covid test therefore he was referred to the emergency department.  Patient denies fever, dyspnea, chest pain, abdominal pain, vomiting, or hematochezia.  He does not wear oxygen at home. Denies hemoptysis, recent surgery/trauma, recent long travel, hormone use,  or hx of DVT/PE.  He states he had prostate cancer at one point, no active treatment that he is aware of. He also states he has baseline LLE swelling from an achilles injury repair several years ago- no acute leg pain/swelling.      HPI     Past Medical History:  Diagnosis Date  . Arthritis   . Cancer ([place])   . Coronary artery disease   . Gout   . Hyperlipidemia   . Hypertension   . Myocardial infarction ([place]) 05/2012   "mild"  . Swelling of left lower extremity    LLE; "happens often"  . Type II diabetes mellitus [facility])     Patient Active Problem List   Diagnosis Date Noted  . CAP (community acquired pneumonia) 02/13/2015  . Hyperbilirubinemia   . Acute respiratory failure with hypoxia ([place]) 02/06/2015  . Sepsis ([place]) 02/06/2015  . SIRS (systemic inflammatory response syndrome) ([place]) 02/05/2015  . Acute on chronic renal failure ([place]) 02/05/2015  . Chronic diastolic heart failure ([place]) 02/05/2015  . Cellulitis of left arm   . Essential hypertension    . Cellulitis 08/22/2014  . Diabetes mellitus type 2, uncontrolled ([place]) 08/22/2014  . Chronic kidney disease, stage 3 08/22/2014  . Malignant neoplasm of prostate ([place]) 02/25/2014  . Coronary atherosclerosis of native coronary artery 10/06/2013  . Obesity, unspecified 10/06/2013  . Chest pain 06/03/2012  . DM (diabetes mellitus) ([place]) 06/03/2012  . HTN (hypertension) 06/03/2012  . Hyperlipidemia 06/03/2012    Past Surgical History:  Procedure Laterality Date  . BACK SURGERY    . CORONARY ANGIOPLASTY WITH STENT PLACEMENT  06/05/2012   "2; total of 2"  . LEFT HEART CATHETERIZATION WITH CORONARY ANGIOGRAM N/A 06/05/2012   Procedure: LEFT HEART CATHETERIZATION WITH CORONARY ANGIOGRAM;  Surgeon: Jettie Booze, MD;  Location: [facility] CATH LAB;  Service: Cardiovascular;  Laterality: N/A;  possible PCI  . LUMBAR DISC SURGERY  1990's  . LYMPHADENECTOMY Bilateral 02/25/2014   Procedure: [facility] LYMPH NODE DISSECTION";  Surgeon: Molli Hazard, MD;  Location: [facility] ORS;  Service: Urology;  Laterality: Bilateral;  . PROSTATE SURGERY    . ROBOT ASSISTED LAPAROSCOPIC RADICAL PROSTATECTOMY N/A 02/25/2014   Procedure: ROBOTIC ASSISTED LAPAROSCOPIC RADICAL  PROSTATECTOMY,  UMBILICAL HERNIA REPAIR;  Surgeon: Molli Hazard, MD;  Location: [facility] ORS;  Service: Urology;  Laterality: N/A;       Family History  Problem Relation Age of Onset  . Breast cancer Mother   . Asthma Father   . Parkinson's disease  Father     Social History   Tobacco Use  . Smoking status: Former Smoker    Packs/day: 2.00    Years: 30.00    Pack years: 60.00    Types: Cigarettes    Quit date: 10/23/1993    Years since quitting: 26.0  . Smokeless tobacco: Never Used  Substance Use Topics  . Alcohol use: No  . Drug use: No    Home Medications Prior to Admission medications   Medication Sig Start Date End Date Taking? Authorizing Provider  allopurinol (ZYLOPRIM) 100 MG tablet Take 300 mg by  mouth daily as needed (gout).     [provider]  amLODipine-valsartan (EXFORGE) 10-320 MG per tablet Take 1 tablet by mouth daily.    [provider]  atorvastatin (LIPITOR) 10 MG tablet Take 10 mg by mouth daily at 6 PM.  08/23/15   [provider]  clopidogrel (PLAVIX) 75 MG tablet TAKE 1 TABLET (75 MG TOTAL) BY MOUTH DAILY. 10/22/14   Jettie Booze, MD  furosemide (LASIX) 40 MG tablet TAKE 1 TABLET (40 MG TOTAL) BY MOUTH DAILY. Please keep upcoming appt in January before anymore refills. Thank you 09/26/19   Jettie Booze, MD  Insulin Glargine [facility]) 100 UNIT/ML SOPN Inject 25 Units into the skin daily. 08/13/17   [provider]  insulin glargine (LANTUS) 100 UNIT/ML injection Inject 25 Units into the skin every morning.     [provider]  JANUVIA 100 MG tablet Take 100 mg by mouth daily. 08/13/17   [provider]  metFORMIN (GLUCOPHAGE) 1000 MG tablet Take 1,000 mg by mouth 2 (two) times daily with a meal.    [provider]  metoprolol (LOPRESSOR) 100 MG tablet Take 1 tablet (100 mg total) by mouth 2 (two) times daily. 02/13/15   Charlynne Cousins, MD  nitroGLYCERIN (NITROSTAT) 0.4 MG SL tablet Place 1 tablet (0.4 mg total) under the tongue every 5 (five) minutes as needed for chest pain. 08/21/17   Jettie Booze, MD    Allergies    Patient has no known allergies.  Review of Systems   Review of Systems  Constitutional: Positive for chills. Negative for fever.  HENT: Positive for congestion. Negative for ear pain and sore throat.   Respiratory: Positive for cough. Negative for shortness of breath.   Cardiovascular: Negative for chest pain.  Gastrointestinal: Negative for abdominal pain, blood in stool and vomiting.  Genitourinary: Negative for dysuria.  Neurological: Negative for syncope.  All other systems reviewed and are negative.   Physical Exam Updated Vital Signs BP 106/62  (BP Location: Left Arm)   Pulse 72   Temp 98.6 F (37 C) (Oral)   Resp 18   SpO2 98%   Physical Exam Vitals and nursing note reviewed.  Constitutional:      General: He is not in acute distress.    Appearance: He is well-developed. He is not toxic-appearing.  HENT:     Head: Normocephalic and atraumatic.     Right Ear: Ear canal normal. Tympanic membrane is not perforated, erythematous, retracted or bulging.     Left Ear: Ear canal normal. Tympanic membrane is not perforated, erythematous, retracted or bulging.     Ears:     Comments: No mastoid erythema/swellng/tenderness.     Nose:     Right Sinus: No maxillary sinus tenderness or frontal sinus tenderness.     Left Sinus: No maxillary sinus tenderness or frontal sinus tenderness.  Mouth/Throat:     Pharynx: Oropharynx is clear. Uvula midline. No oropharyngeal exudate or posterior oropharyngeal erythema.     Comments: Posterior oropharynx is symmetric appearing. Patient tolerating own secretions without difficulty. No trismus. No drooling. No hot potato voice. No swelling beneath the tongue, submandibular compartment is soft.  Eyes:     General:        Right eye: No discharge.        Left eye: No discharge.     Conjunctiva/sclera: Conjunctivae normal.  Cardiovascular:     Rate and Rhythm: Normal rate and regular rhythm.  Pulmonary:     Effort: No respiratory distress.     Breath sounds: Normal breath sounds. No wheezing, rhonchi or rales.     Comments: Patient is intermittently mildly tachypneic.  His SPO2 on room air on my initial assessment is 87%-this improved to low to mid 90s on 2 L via nasal cannula. Abdominal:     General: There is no distension.     Palpations: Abdomen is soft.     Tenderness: There is no abdominal tenderness.  Musculoskeletal:     Cervical back: Neck supple. No rigidity.     Comments: Lower extremities: Asymmetric swelling with left lower leg greater than right lower leg, patient states this is  baseline without acute change.  He does have mild pitting edema noted bilaterally.  No significant calf tenderness.  No erythema/warmth.  Lymphadenopathy:     Cervical: No cervical adenopathy.  Skin:    General: Skin is warm and dry.     Findings: No rash.  Neurological:     Mental Status: He is alert.  Psychiatric:        Behavior: Behavior normal.    ED Results / Procedures / Treatments   Labs (all labs ordered are listed, but only abnormal results are displayed) Labs Reviewed - No data to display  EKG EKG Interpretation  Date/Time:  Tuesday October 21 2019 16:41:34 EST Ventricular Rate:  77 PR Interval:    QRS Duration: 86 QT Interval:  352 QTC Calculation: 399 R Axis:   34 Text Interpretation: Sinus rhythm Baseline wander in lead(s) V2 No significant change was found Confirmed by Ezequiel Essex [phone]) on 10/21/2019 5:16:15 PM   Radiology DG Chest 2 View  Result Date: 10/21/2019 CLINICAL DATA:  Short of breath.  COVID-19 positive EXAM: CHEST - 2 VIEW COMPARISON:  02/22/2015 FINDINGS: Heart size and vascularity normal Streaky lung markings are present in the bases bilaterally. These were not present on the prior study. No effusion. IMPRESSION: Patchy bibasilar airspace disease. Probable pneumonia given the COVID-19 history. Electronically Signed   By: Franchot Gallo M.D.   On: 10/21/2019 11:54    Procedures .Critical Care Performed by: Amaryllis Dyke, PA-C Authorized by: Amaryllis Dyke, PA-C     CRITICAL CARE Performed by: Kennith Maes   Total critical care time: 30 minutes  Critical care time was exclusive of separately billable procedures and treating other patients.  Critical care was necessary to treat or prevent imminent or life-threatening deterioration.  Critical care was time spent personally by me on the following activities: development of treatment plan with patient and/or surrogate as well as nursing, discussions with  consultants, evaluation of patient's response to treatment, examination of patient, obtaining history from patient or surrogate, ordering and performing treatments and interventions, ordering and review of laboratory studies, ordering and review of radiographic studies, pulse oximetry and re-evaluation of patient's condition.   (including critical care time)  Medications Ordered in ED Medications  AeroChamber Plus Flo-Vu Medium MISC 1 each (has no administration in time range)  albuterol (VENTOLIN HFA) 108 (90 Base) MCG/ACT inhaler 2 puff (2 puffs Inhalation Given 10/21/19 1700)  dexamethasone (DECADRON) injection 6 mg (6 mg Intravenous Given 10/21/19 1700)    ED Course  I have reviewed the triage vital signs and the nursing notes.  Pertinent labs & imaging results that were available during my care of the patient were reviewed by me and considered in my medical decision making (see chart for details).    MDM Rules/Calculators/A&P                      Patient presents to the emergency department from urgent care for evaluation of hypoxia in the setting of positive COVID-19 testing today which was confirmed on chart review.  Additionally have reviewed chest x-ray from urgent care visit which reveals patchy bibasilar airspace disease, probable pneumonia given the COVID-19 history.  [name] lungs are CTA, however he is hypoxic to mid to upper 80s on room air--> improved with application of 2 L via nasal cannula..  He does have asymmetric swelling to the lower extremities which he states is baseline, no calf tenderness.  Will administer albuterol and Decadron.  Plan for EKG & labs.  Anticipate admission.  EKG without significant change from prior.  CBC: No leukocytosis or leukopenia.  No anemia.  Thrombocytopenia. CMP: Elevated BUN/creatinine of 42/2.49 in comparison to prior 27/1.74 most recently on record. LFTs WNL. No significant electrolyte derangement, mild hypocalcemia.  Lactic acid  WNL Several elevated inflammatory markers.  Will consult hospitalist service for hypoxic respiratory failure in setting of COVID 19.   Discussed with patient as well as his daughter in law via telephone, provided opportunity for questions, they have confirmed understanding & are in agreement. His daughter in law Merry Lofty would like to be kept updated on patient's status and can be reached at [phone], patient agreeable to this.   18:42: CONSULT: Discussed with hospitalist Dr. Marthenia Rolling- accepts admission.   This is a shared visit with supervising physician Dr. Wyvonnia Dusky who has independently evaluated patient - in agreement.   Gregory [name] was evaluated in Emergency Department on 10/21/2019 for the symptoms described in the history of present illness. He/she was evaluated in the context of the global COVID-19 pandemic, which necessitated consideration that the patient might be at risk for infection with the SARS-CoV-2 virus that causes COVID-19. Institutional protocols and algorithms that pertain to the evaluation of patients at risk for COVID-19 are in a state of rapid change based on information released by regulatory bodies including the CDC and federal and state organizations. These policies and algorithms were followed during the patient's care in the ED.   Final Clinical Impression(s) / ED Diagnoses Final diagnoses:  [id number]  Acute hypoxemic respiratory failure [facility])    Rx / DC Orders ED Discharge Orders    None       Amaryllis Dyke, PA-C 10/21/19 1853    Ezequiel Essex, MD 10/21/19 [phone]

## 2019-10-21 NOTE — ED Triage Notes (Signed)
C/o non-productive cough x 4 days.  States he was seen at [facility] and diagnosed with COVID.  Denies any other symptoms.  Denies pain.  Pt states, "they said I was SOB but I don't feel it."

## 2019-10-22 ENCOUNTER — Inpatient Hospital Stay (HOSPITAL_COMMUNITY): Payer: Medicare HMO

## 2019-10-22 DIAGNOSIS — N179 Acute kidney failure, unspecified: Secondary | ICD-10-CM

## 2019-10-22 DIAGNOSIS — E1165 Type 2 diabetes mellitus with hyperglycemia: Secondary | ICD-10-CM

## 2019-10-22 DIAGNOSIS — I2510 Atherosclerotic heart disease of native coronary artery without angina pectoris: Secondary | ICD-10-CM

## 2019-10-22 DIAGNOSIS — N183 Chronic kidney disease, stage 3 unspecified: Secondary | ICD-10-CM

## 2019-10-22 DIAGNOSIS — I10 Essential (primary) hypertension: Secondary | ICD-10-CM

## 2019-10-22 DIAGNOSIS — E782 Mixed hyperlipidemia: Secondary | ICD-10-CM

## 2019-10-22 DIAGNOSIS — J9600 Acute respiratory failure, unspecified whether with hypoxia or hypercapnia: Secondary | ICD-10-CM | POA: Diagnosis present

## 2019-10-22 LAB — CBC WITH DIFFERENTIAL/PLATELET
Abs Immature Granulocytes: 0.01 10*3/uL (ref 0.00–0.07)
Basophils Absolute: 0 10*3/uL (ref 0.0–0.1)
Basophils Relative: 0 %
Eosinophils Absolute: 0 10*3/uL (ref 0.0–0.5)
Eosinophils Relative: 0 %
HCT: 48.8 % (ref 39.0–52.0)
Hemoglobin: 16.2 g/dL (ref 13.0–17.0)
Immature Granulocytes: 0 %
Lymphocytes Relative: 17 %
Lymphs Abs: 0.5 10*3/uL — ABNORMAL LOW (ref 0.7–4.0)
MCH: 30.8 pg (ref 26.0–34.0)
MCHC: 33.2 g/dL (ref 30.0–36.0)
MCV: 92.8 fL (ref 80.0–100.0)
Monocytes Absolute: 0.1 10*3/uL (ref 0.1–1.0)
Monocytes Relative: 4 %
Neutro Abs: 2.1 10*3/uL (ref 1.7–7.7)
Neutrophils Relative %: 79 %
Platelets: 159 10*3/uL (ref 150–400)
RBC: 5.26 MIL/uL (ref 4.22–5.81)
RDW: 12.9 % (ref 11.5–15.5)
WBC: 2.7 10*3/uL — ABNORMAL LOW (ref 4.0–10.5)
nRBC: 0 % (ref 0.0–0.2)

## 2019-10-22 LAB — COMPREHENSIVE METABOLIC PANEL
ALT: 19 U/L (ref 0–44)
AST: 21 U/L (ref 15–41)
Albumin: 3.1 g/dL — ABNORMAL LOW (ref 3.5–5.0)
Alkaline Phosphatase: 51 U/L (ref 38–126)
Anion gap: 13 (ref 5–15)
BUN: 49 mg/dL — ABNORMAL HIGH (ref 8–23)
CO2: 20 mmol/L — ABNORMAL LOW (ref 22–32)
Calcium: 8.2 mg/dL — ABNORMAL LOW (ref 8.9–10.3)
Chloride: 102 mmol/L (ref 98–111)
Creatinine, Ser: 2.18 mg/dL — ABNORMAL HIGH (ref 0.61–1.24)
GFR calc Af Amer: 35 mL/min — ABNORMAL LOW (ref >60)
GFR calc non Af Amer: 30 mL/min — ABNORMAL LOW (ref >60)
Glucose, Bld: 244 mg/dL — ABNORMAL HIGH (ref 70–99)
Potassium: 5.3 mmol/L — ABNORMAL HIGH (ref 3.5–5.1)
Sodium: 135 mmol/L (ref 135–145)
Total Bilirubin: 0.5 mg/dL (ref 0.3–1.2)
Total Protein: 6.6 g/dL (ref 6.5–8.1)

## 2019-10-22 LAB — D-DIMER, QUANTITATIVE: D-Dimer, Quant: 0.6 ug/mL-FEU — ABNORMAL HIGH (ref 0.00–0.50)

## 2019-10-22 LAB — PHOSPHORUS: Phosphorus: 5.7 mg/dL — ABNORMAL HIGH (ref 2.5–4.6)

## 2019-10-22 LAB — FERRITIN: Ferritin: 258 ng/mL (ref 24–336)

## 2019-10-22 LAB — GLUCOSE, CAPILLARY
Glucose-Capillary: 265 mg/dL — ABNORMAL HIGH (ref 70–99)
Glucose-Capillary: 205 mg/dL — ABNORMAL HIGH (ref 70–99)
Glucose-Capillary: 175 mg/dL — ABNORMAL HIGH (ref 70–99)

## 2019-10-22 LAB — MAGNESIUM: Magnesium: 2.3 mg/dL (ref 1.7–2.4)

## 2019-10-22 LAB — CBG MONITORING, ED: Glucose-Capillary: 235 mg/dL — ABNORMAL HIGH (ref 70–99)

## 2019-10-22 LAB — HEMOGLOBIN A1C
Hgb A1c MFr Bld: 7.1 % — ABNORMAL HIGH (ref 4.8–5.6)
Mean Plasma Glucose: 157.07 mg/dL

## 2019-10-22 LAB — C-REACTIVE PROTEIN: CRP: 3.8 mg/dL — ABNORMAL HIGH (ref <1.0)

## 2019-10-22 LAB — PROCALCITONIN: Procalcitonin: 0.12 ng/mL

## 2019-10-22 LAB — HIV ANTIBODY (ROUTINE TESTING W REFLEX): HIV Screen 4th Generation wRfx: NONREACTIVE

## 2019-10-22 MED ORDER — INSULIN GLARGINE 100 UNIT/ML ~~LOC~~ SOLN
30.0000 [IU] | Freq: Every morning | SUBCUTANEOUS | Status: DC
  Administered 2019-10-23 – 2019-10-26 (×4): 30 [IU] via SUBCUTANEOUS
  Filled 2019-10-22 (×4): qty 0.3

## 2019-10-22 MED ORDER — LORATADINE 10 MG PO TABS
10.0000 mg | ORAL_TABLET | Freq: Every day | ORAL | Status: DC
  Administered 2019-10-22 – 2019-10-26 (×5): 10 mg via ORAL
  Filled 2019-10-22 (×5): qty 1

## 2019-10-22 MED ORDER — LEVALBUTEROL TARTRATE 45 MCG/ACT IN AERO
2.0000 | INHALATION_SPRAY | Freq: Four times a day (QID) | RESPIRATORY_TRACT | Status: DC
  Administered 2019-10-22 – 2019-10-26 (×14): 2 via RESPIRATORY_TRACT
  Filled 2019-10-22: qty 15

## 2019-10-22 MED ORDER — INSULIN GLARGINE 100 UNIT/ML ~~LOC~~ SOLN
25.0000 [IU] | Freq: Every morning | SUBCUTANEOUS | Status: DC
  Administered 2019-10-22: 25 [IU] via SUBCUTANEOUS
  Filled 2019-10-22 (×2): qty 0.25

## 2019-10-22 MED ORDER — IPRATROPIUM BROMIDE HFA 17 MCG/ACT IN AERS
2.0000 | INHALATION_SPRAY | Freq: Four times a day (QID) | RESPIRATORY_TRACT | Status: DC
  Administered 2019-10-22 – 2019-10-26 (×13): 2 via RESPIRATORY_TRACT
  Filled 2019-10-22: qty 12.9

## 2019-10-22 MED ORDER — INSULIN ASPART 100 UNIT/ML ~~LOC~~ SOLN
0.0000 [IU] | Freq: Three times a day (TID) | SUBCUTANEOUS | Status: DC
  Administered 2019-10-22: 2 [IU] via SUBCUTANEOUS
  Administered 2019-10-22: 14:00:00 3 [IU] via SUBCUTANEOUS
  Administered 2019-10-23 (×2): 1 [IU] via SUBCUTANEOUS
  Administered 2019-10-23: 13:00:00 3 [IU] via SUBCUTANEOUS
  Administered 2019-10-24 – 2019-10-26 (×5): 1 [IU] via SUBCUTANEOUS

## 2019-10-22 MED ORDER — SODIUM CHLORIDE 0.9 % IV SOLN: INTRAVENOUS | Status: AC

## 2019-10-22 MED ORDER — INSULIN ASPART 100 UNIT/ML ~~LOC~~ SOLN
0.0000 [IU] | Freq: Every day | SUBCUTANEOUS | Status: DC
  Administered 2019-10-22: 03:00:00 2 [IU] via SUBCUTANEOUS

## 2019-10-22 MED ORDER — GUAIFENESIN ER 600 MG PO TB12
1200.0000 mg | ORAL_TABLET | Freq: Two times a day (BID) | ORAL | Status: DC
  Administered 2019-10-22 – 2019-10-26 (×9): 1200 mg via ORAL
  Filled 2019-10-22 (×9): qty 2

## 2019-10-22 MED ORDER — CLOPIDOGREL BISULFATE 75 MG PO TABS
75.0000 mg | ORAL_TABLET | Freq: Every day | ORAL | Status: DC
  Administered 2019-10-22 – 2019-10-26 (×5): 75 mg via ORAL
  Filled 2019-10-22 (×4): qty 1

## 2019-10-22 MED ORDER — ALLOPURINOL 300 MG PO TABS
300.0000 mg | ORAL_TABLET | Freq: Every day | ORAL | Status: DC | PRN
  Filled 2019-10-22: qty 1

## 2019-10-22 MED ORDER — ATORVASTATIN CALCIUM 10 MG PO TABS
10.0000 mg | ORAL_TABLET | Freq: Every day | ORAL | Status: DC
  Administered 2019-10-22 – 2019-10-25 (×4): 10 mg via ORAL
  Filled 2019-10-22 (×4): qty 1

## 2019-10-22 MED ORDER — FLUTICASONE PROPIONATE 50 MCG/ACT NA SUSP
2.0000 | Freq: Every day | NASAL | Status: DC
  Administered 2019-10-23 – 2019-10-26 (×4): 2 via NASAL
  Filled 2019-10-22 (×2): qty 16

## 2019-10-22 NOTE — H&P (Signed)
History and Physical  Gregory [name] [id number] DOB: [birth year] DOA: 10/21/2019  Referring physician: ER provider PCP: Seward Carol, MD  Outpatient Specialists:    Patient coming from: Home  Chief Complaint: Shortness of breath  HPI: Patient is a 68 year old male, who works as a Art gallery manager and a Psychologist, counselling.  [name] past medical history includes coronary artery disease, MI, diabetes mellitus, CHF, hypertension, hyperlipidemia, GERD and chronic kidney disease stage IIIa.  According to the patient, he was not feeling well about 4 days prior to presentation.  Patient is a poor historian.  Patient reported having hallucinations.  However, on further questioning patient explained that he was confused and that was what he referred as hallucinations.  Patient also had a general feeling of unwell.  Patient decided to test for COVID-19 after he learned that his coworker was positive for COVID-19.  Patient was tested at an urgent care was found to be positive.  Patient was advised to come to the hospital for further assessment and management.  Patient was also noted to be hypoxic with O2 sat of 86%.  Patient is currently on 2 to 3 L of supplemental oxygen.  Inflammatory markers are elevated.  Chest x-ray revealed patchy bibasilar airspace disease that is thought to be secondary to COVID-19 pneumonia.  No headache, no neck pain, no chest pain, no fever or chills and no urinary symptoms.  Hospitalist team has been asked to admit patient for further assessment and management of the COVID-19.  ED Course: On presentation to the hospital, temperature was 98.6, heart rate of 77, respiratory rate of 26, blood pressure of 128/76 mmHg and oxygen of 86%.  Pertinent labs reveal serum creatinine of 2.49 (up from 1.04), LDH of 215, ferritin of 226, CRP of 4.8, fibrinogen of 631, D-dimer of 0.66.  Patient was given the first dose of steroids in the emergency department.  Pertinent labs: As documented  above.  EKG: Independently reviewed.   Imaging: independently reviewed.   Review of Systems:  Negative for fever, rash, new muscle aches, chest pain, dysuria, bleeding, n/v/abdominal pain.  Past Medical History:  Diagnosis Date  . Arthritis   . Cancer ([place])   . Coronary artery disease   . Gout   . Hyperlipidemia   . Hypertension   . Myocardial infarction ([place]) 05/2012   "mild"  . Swelling of left lower extremity    LLE; "happens often"  . Type II diabetes mellitus ([place])     Past Surgical History:  Procedure Laterality Date  . BACK SURGERY    . CORONARY ANGIOPLASTY WITH STENT PLACEMENT  06/05/2012   "2; total of 2"  . LEFT HEART CATHETERIZATION WITH CORONARY ANGIOGRAM N/A 06/05/2012   Procedure: LEFT HEART CATHETERIZATION WITH CORONARY ANGIOGRAM;  Surgeon: Jettie Booze, MD;  Location: [facility] CATH LAB;  Service: Cardiovascular;  Laterality: N/A;  possible PCI  . LUMBAR DISC SURGERY  1990's  . LYMPHADENECTOMY Bilateral 02/25/2014   Procedure: [facility] LYMPH NODE DISSECTION";  Surgeon: Molli Hazard, MD;  Location: [facility] ORS;  Service: Urology;  Laterality: Bilateral;  . PROSTATE SURGERY    . ROBOT ASSISTED LAPAROSCOPIC RADICAL PROSTATECTOMY N/A 02/25/2014   Procedure: ROBOTIC ASSISTED LAPAROSCOPIC RADICAL  PROSTATECTOMY,  UMBILICAL HERNIA REPAIR;  Surgeon: Molli Hazard, MD;  Location: [facility] ORS;  Service: Urology;  Laterality: N/A;     reports that he quit smoking about 26 years ago. His smoking use included cigarettes. He has a 60.00 pack-year smoking history. He has never used  smokeless tobacco. He reports that he does not drink alcohol or use drugs.  No Known Allergies  Family History  Problem Relation Age of Onset  . Breast cancer Mother   . Asthma Father   . Parkinson's disease Father      Prior to Admission medications   Medication Sig Start Date End Date Taking? Authorizing Provider  allopurinol (ZYLOPRIM) 100 MG tablet Take 300 mg by  mouth daily as needed (gout).     [provider]  amLODipine-valsartan (EXFORGE) 10-320 MG per tablet Take 1 tablet by mouth daily.    [provider]  atorvastatin (LIPITOR) 10 MG tablet Take 10 mg by mouth daily at 6 PM.  08/23/15   [provider]  clopidogrel (PLAVIX) 75 MG tablet TAKE 1 TABLET (75 MG TOTAL) BY MOUTH DAILY. 10/22/14   Jettie Booze, MD  furosemide (LASIX) 40 MG tablet TAKE 1 TABLET (40 MG TOTAL) BY MOUTH DAILY. Please keep upcoming appt in January before anymore refills. Thank you 09/26/19   Jettie Booze, MD  Insulin Glargine [facility]) 100 UNIT/ML SOPN Inject 25 Units into the skin daily. 08/13/17   [provider]  insulin glargine (LANTUS) 100 UNIT/ML injection Inject 25 Units into the skin every morning.     [provider]  JANUVIA 100 MG tablet Take 100 mg by mouth daily. 08/13/17   [provider]  metFORMIN (GLUCOPHAGE) 1000 MG tablet Take 1,000 mg by mouth 2 (two) times daily with a meal.    [provider]  metoprolol (LOPRESSOR) 100 MG tablet Take 1 tablet (100 mg total) by mouth 2 (two) times daily. 02/13/15   Charlynne Cousins, MD  nitroGLYCERIN (NITROSTAT) 0.4 MG SL tablet Place 1 tablet (0.4 mg total) under the tongue every 5 (five) minutes as needed for chest pain. 08/21/17   Jettie Booze, MD    Physical Exam: Vitals:   10/21/19 1730 10/21/19 1800 10/21/19 1845 10/21/19 1900  BP: 126/73 130/75 112/74 132/74  Pulse: 77 83 77 80  Resp: (!) 28 (!) 27 (!) 28 (!) 29  Temp:      TempSrc:      SpO2: 93% 93% 93%     Constitutional:  . Appears calm and comfortable.  Mildly obese. Eyes:  . No pallor. No jaundice.  ENMT:  . external ears, nose appear normal Neck:  . Neck is supple. No JVD Respiratory:        Decreased air entry  cardiovascular:  . S1S2 . No LE extremity edema   Abdomen:  . Abdomen is soft and non tender. Organs are difficult to  assess. Neurologic:  . Awake and alert. . Moves all limbs.  Wt Readings from Last 3 Encounters:  12/24/17 110.2 kg  08/21/17 109.4 kg  09/20/15 104.3 kg    I have personally reviewed following labs and imaging studies  Labs on Admission:  CBC: Recent Labs  Lab 10/21/19 1651  WBC 5.0  NEUTROABS 3.7  HGB 16.9  HCT 51.3  MCV 94.0  PLT 123456*   Basic Metabolic Panel: Recent Labs  Lab 10/21/19 1651  NA 138  K 4.8  CL 101  CO2 25  GLUCOSE 123*  BUN 42*  CREATININE 2.49*  CALCIUM 8.4*   Liver Function Tests: Recent Labs  Lab 10/21/19 1651  AST 24  ALT 18  ALKPHOS 52  BILITOT 0.7  PROT 6.8  ALBUMIN 3.6   No results for input(s): LIPASE, AMYLASE in the last 168  hours. No results for input(s): AMMONIA in the last 168 hours. Coagulation Profile: No results for input(s): INR, PROTIME in the last 168 hours. Cardiac Enzymes: No results for input(s): CKTOTAL, CKMB, CKMBINDEX, TROPONINI in the last 168 hours. BNP (last 3 results) No results for input(s): PROBNP in the last 8760 hours. HbA1C: No results for input(s): HGBA1C in the last 72 hours. CBG: No results for input(s): GLUCAP in the last 168 hours. Lipid Profile: Recent Labs    10/21/19 1651  TRIG 136   Thyroid Function Tests: No results for input(s): TSH, T4TOTAL, FREET4, T3FREE, THYROIDAB in the last 72 hours. Anemia Panel: Recent Labs    10/21/19 1651  FERRITIN 226   Urine analysis:    Component Value Date/Time   COLORURINE YELLOW 02/07/2015 1409   APPEARANCEUR CLOUDY (A) 02/07/2015 1409   LABSPEC 1.010 02/07/2015 1409   PHURINE 5.0 02/07/2015 1409   GLUCOSEU NEGATIVE 02/07/2015 1409   HGBUR MODERATE (A) 02/07/2015 1409   BILIRUBINUR NEGATIVE 02/07/2015 1409   KETONESUR NEGATIVE 02/07/2015 1409   PROTEINUR 30 (A) 02/07/2015 1409   UROBILINOGEN 1.0 02/07/2015 1409   NITRITE NEGATIVE 02/07/2015 1409   LEUKOCYTESUR NEGATIVE 02/07/2015 1409   Sepsis  Labs: @LABRCNTIP (procalcitonin:4,lacticidven:4) )No results found for this or any previous visit (from the past 240 hour(s)).    Radiological Exams on Admission: DG Chest 2 View  Result Date: 10/21/2019 CLINICAL DATA:  Short of breath.  COVID-19 positive EXAM: CHEST - 2 VIEW COMPARISON:  02/22/2015 FINDINGS: Heart size and vascularity normal Streaky lung markings are present in the bases bilaterally. These were not present on the prior study. No effusion. IMPRESSION: Patchy bibasilar airspace disease. Probable pneumonia given the COVID-19 history. Electronically Signed   By: Franchot Gallo M.D.   On: 10/21/2019 11:54    EKG: Independently reviewed.  Active Problems:   Pneumonia due to severe acute respiratory syndrome coronavirus 2 (SARS-CoV-2)   Assessment/Plan  Pneumonia due to severe acute respiratory syndrome coronavirus 2 (SARS-CoV-2): -Admit patient for further assessment and management. -Check inflammatory markers daily -Start patient on remdesivir -Continue steroids  AKI on chronic kidney disease stage: -Possibly secondary to Covid. -AKI work-up -Check urinalysis. -Check sodium. -Monitor renal function and electrolytes -Further management depend on hospital course.  Diabetes mellitus: Sliding scale insulin coverage.  DVT prophylaxis: Lovenox Code Status: Full code Family Communication:  Disposition Plan: Home eventually Consults called: None Admission status: Inpatient  Time spent: 65 minutes  Dana Allan, MD  [facility] Pager #: [phone] 7PM-7AM contact night coverage as above  10/21/2019, 7:04 PM

## 2019-10-22 NOTE — Progress Notes (Addendum)
PROGRESS NOTE    Gregory [name]  [id number] DOB: 1951-04-01 DOA: 10/21/2019 PCP: Seward Carol, MD   Brief Narrative:  HPI per Dr. Marthenia Rolling Patient is a 68 year old male, who works as a Art gallery manager and a Psychologist, counselling.  [name] past medical history includes coronary artery disease, MI, diabetes mellitus, CHF, hypertension, hyperlipidemia, GERD and chronic kidney disease stage IIIa.  According to the patient, he was not feeling well about 4 days prior to presentation.  Patient is a poor historian.  Patient reported having hallucinations.  However, on further questioning patient explained that he was confused and that was what he referred as hallucinations.  Patient also had a general feeling of unwell.  Patient decided to test for COVID-19 after he learned that his coworker was positive for COVID-19.  Patient was tested at an urgent care was found to be positive.  Patient was advised to come to the hospital for further assessment and management.  Patient was also noted to be hypoxic with O2 sat of 86%.  Patient is currently on 2 to 3 L of supplemental oxygen.  Inflammatory markers are elevated.  Chest x-ray revealed patchy bibasilar airspace disease that is thought to be secondary to COVID-19 pneumonia.  No headache, no neck pain, no chest pain, no fever or chills and no urinary symptoms.  Hospitalist team has been asked to admit patient for further assessment and management of the COVID-19.  ED Course: On presentation to the hospital, temperature was 98.6, heart rate of 77, respiratory rate of 26, blood pressure of 128/76 mmHg and oxygen of 86%.  Pertinent labs reveal serum creatinine of 2.49 (up from 1.04), LDH of 215, ferritin of 226, CRP of 4.8, fibrinogen of 631, D-dimer of 0.66.  Patient was given the first dose of steroids in the emergency department.   Assessment & Plan:   Active Problems:   Pneumonia due to severe acute respiratory syndrome coronavirus 2 (SARS-CoV-2)   1  acute respiratory failure with hypoxia secondary to COVID-19 pneumonia Patient had presented with complaints of feeling unwell.  Patient was seen at urgent care and Covid test was positive.  Patient noted to be hypoxic with sats in the 80s on room air.  Patient noted to have elevated inflammatory markers.  Chest x-ray which was done with bilateral infiltrates.  Patient placed on O2.  Procalcitonin was 0.21.  Continue IV Decadron to complete a 10-day course, IV remdesivir x5 days.  Place on Claritin, Mucinex, Flonase, Atrovent and Xopenex MDIs.  Follow inflammatory markers.  Supportive care.  2.  Acute renal failure on chronic kidney disease stage III Patient noted on admission to have a creatinine of 2.49.  Baseline creatinine anywhere from 1.6-2.  Likely secondary to prerenal azotemia in the setting of ARB and diuretics.  Diuretics on hold.  Amlodipine/valsartan on hold.  Gentle hydration x24 hours.  Check a fractional excretion of sodium.  Check a renal ultrasound.  Follow.  3.  Diabetes mellitus type 2 Hemoglobin A1c 7.1.  CBG of 235 this morning.  Likely component of steroids.  Hold oral hypoglycemic agents.  Increase home dose Lantus to 30 units daily.  Continue sliding scale insulin.  Follow.  4.  Gout Resume allopurinol.  5.  Hypertension Blood pressure borderline.  Hold antihypertensive medications.  Monitor with gentle hydration.  Follow.  6.  Hyperlipidemia  Continue statin.  7.  Coronary artery disease Stable.  Continue statin, Plavix.  Hold Exforge and diuretics secondary to problem #2.  Once blood pressure  improves will resume beta-blocker.   DVT prophylaxis: Lovenox Code Status: Full Family Communication: Updated patient.  No family at bedside. Disposition Plan: Home when clinically improved, hypoxia improved with completion of IV remdesivir.   Consultants:   None  Procedures:   Chest x-ray 10/21/2019  Renal ultrasound pending  10/22/2019  Antimicrobials:  None   Subjective: Patient laying on gurney.  States some improvement with symptoms on presentation.  Stated he was just not feeling as well.   Objective: Vitals:   10/22/19 0115 10/22/19 0200 10/22/19 0427 10/22/19 0642  BP: 119/70  (!) 100/51   Pulse: 71 76 68 89  Resp: (!) 24 (!) 30 20 (!) 28  Temp:      TempSrc:      SpO2: 94% 94% 95% 95%   No intake or output data in the 24 hours ending 10/22/19 1054 There were no vitals filed for this visit.  Examination:  General exam: NAD  Respiratory system: Decreased breath sounds in the bases.  Speaking in full sentences.  No wheezing.  Normal respiratory effort.   Cardiovascular system: S1 & S2 heard, RRR. No JVD, murmurs, rubs, gallops or clicks. No pedal edema. Gastrointestinal system: Abdomen is mildly distended, soft, nontender to palpation, positive bowel sounds.  No rebound.  No guarding Central nervous system: Alert and oriented. No focal neurological deficits. Extremities: Symmetric 5 x 5 power. Skin: No rashes, lesions or ulcers Psychiatry: Judgement and insight appear normal. Mood & affect appropriate.     Data Reviewed: I have personally reviewed following labs and imaging studies  CBC: Recent Labs  Lab 10/21/19 1651 10/22/19 0622  WBC 5.0 2.7*  NEUTROABS 3.7 2.1  HGB 16.9 16.2  HCT 51.3 48.8  MCV 94.0 92.8  PLT 148* [id number]   Basic Metabolic Panel: Recent Labs  Lab 10/21/19 1651 10/22/19 0622  NA 138 135  K 4.8 5.3*  CL 101 102  CO2 25 20*  GLUCOSE 123* 244*  BUN 42* 49*  CREATININE 2.49* 2.18*  CALCIUM 8.4* 8.2*  MG  --  2.3  PHOS  --  5.7*   GFR: CrCl cannot be calculated (Unknown ideal weight.). Liver Function Tests: Recent Labs  Lab 10/21/19 1651 10/22/19 0622  AST 24 21  ALT 18 19  ALKPHOS 52 51  BILITOT 0.7 0.5  PROT 6.8 6.6  ALBUMIN 3.6 3.1*   No results for input(s): LIPASE, AMYLASE in the last 168 hours. No results for input(s): AMMONIA in the  last 168 hours. Coagulation Profile: No results for input(s): INR, PROTIME in the last 168 hours. Cardiac Enzymes: No results for input(s): CKTOTAL, CKMB, CKMBINDEX, TROPONINI in the last 168 hours. BNP (last 3 results) No results for input(s): PROBNP in the last 8760 hours. HbA1C: Recent Labs    10/22/19 0622  HGBA1C 7.1*   CBG: Recent Labs  Lab 10/22/19 0248  GLUCAP 235*   Lipid Profile: Recent Labs    10/21/19 1651  TRIG 136   Thyroid Function Tests: No results for input(s): TSH, T4TOTAL, FREET4, T3FREE, THYROIDAB in the last 72 hours. Anemia Panel: Recent Labs    10/21/19 1651 10/22/19 0622  FERRITIN 226 258   Sepsis Labs: Recent Labs  Lab 10/21/19 1651  PROCALCITON 0.21  LATICACIDVEN 1.1    No results found for this or any previous visit (from the past 240 hour(s)).       Radiology Studies: DG Chest 2 View  Result Date: 10/21/2019 CLINICAL DATA:  Short of breath.  COVID-19 positive  EXAM: CHEST - 2 VIEW COMPARISON:  02/22/2015 FINDINGS: Heart size and vascularity normal Streaky lung markings are present in the bases bilaterally. These were not present on the prior study. No effusion. IMPRESSION: Patchy bibasilar airspace disease. Probable pneumonia given the COVID-19 history. Electronically Signed   By: Franchot Gallo M.D.   On: 10/21/2019 11:54        Scheduled Meds: . AeroChamber Plus Flo-Vu Medium  1 each Other Once  . vitamin C  500 mg Oral Daily  . atorvastatin  10 mg Oral q1800  . clopidogrel  75 mg Oral Daily  . dexamethasone (DECADRON) injection  6 mg Intravenous Q24H  . enoxaparin (LOVENOX) injection  40 mg Subcutaneous Q24H  . fluticasone  2 spray Each Nare Daily  . folic acid  1 mg Oral Daily  . guaiFENesin  1,200 mg Oral BID  . insulin aspart  0-5 Units Subcutaneous QHS  . insulin aspart  0-6 Units Subcutaneous TID WC  . insulin glargine  25 Units Subcutaneous q morning - 10a  . ipratropium  2 puff Inhalation Q6H  . levalbuterol   2 puff Inhalation Q6H  . loratadine  10 mg Oral Daily  . multivitamin with minerals  1 tablet Oral Daily  . thiamine  100 mg Oral Daily  . zinc sulfate  220 mg Oral Daily   Continuous Infusions: . sodium chloride    . remdesivir 100 mg in NS 100 mL       LOS: 1 day    Time spent: 40 minutes    Irine Seal, MD [facility]  If 7PM-7AM, please contact night-coverage [email or web address] 10/22/2019, 10:54 AM

## 2019-10-22 NOTE — ED Notes (Signed)
+  tele  Breakfast ordered 

## 2019-10-23 LAB — CBC WITH DIFFERENTIAL/PLATELET
Abs Immature Granulocytes: 0.01 10*3/uL (ref 0.00–0.07)
Basophils Absolute: 0 10*3/uL (ref 0.0–0.1)
Basophils Relative: 0 %
Eosinophils Absolute: 0 10*3/uL (ref 0.0–0.5)
Eosinophils Relative: 0 %
HCT: 46.8 % (ref 39.0–52.0)
Hemoglobin: 15.3 g/dL (ref 13.0–17.0)
Immature Granulocytes: 0 %
Lymphocytes Relative: 10 %
Lymphs Abs: 0.5 10*3/uL — ABNORMAL LOW (ref 0.7–4.0)
MCH: 29.8 pg (ref 26.0–34.0)
MCHC: 32.7 g/dL (ref 30.0–36.0)
MCV: 91.1 fL (ref 80.0–100.0)
Monocytes Absolute: 0.2 10*3/uL (ref 0.1–1.0)
Monocytes Relative: 5 %
Neutro Abs: 4.4 10*3/uL (ref 1.7–7.7)
Neutrophils Relative %: 85 %
Platelets: 153 10*3/uL (ref 150–400)
RBC: 5.14 MIL/uL (ref 4.22–5.81)
RDW: 12.7 % (ref 11.5–15.5)
WBC: 5.2 10*3/uL (ref 4.0–10.5)
nRBC: 0 % (ref 0.0–0.2)

## 2019-10-23 LAB — RESPIRATORY PANEL BY PCR

## 2019-10-23 LAB — INFLUENZA PANEL BY PCR (TYPE A & B)
Influenza A By PCR: NEGATIVE
Influenza B By PCR: NEGATIVE

## 2019-10-23 LAB — D-DIMER, QUANTITATIVE: D-Dimer, Quant: 0.45 ug/mL-FEU (ref 0.00–0.50)

## 2019-10-23 LAB — COMPREHENSIVE METABOLIC PANEL
ALT: 17 U/L (ref 0–44)
AST: 19 U/L (ref 15–41)
Albumin: 2.9 g/dL — ABNORMAL LOW (ref 3.5–5.0)
Alkaline Phosphatase: 43 U/L (ref 38–126)
Anion gap: 9 (ref 5–15)
BUN: 49 mg/dL — ABNORMAL HIGH (ref 8–23)
CO2: 22 mmol/L (ref 22–32)
Calcium: 8 mg/dL — ABNORMAL LOW (ref 8.9–10.3)
Chloride: 105 mmol/L (ref 98–111)
Creatinine, Ser: 1.72 mg/dL — ABNORMAL HIGH (ref 0.61–1.24)
GFR calc Af Amer: 46 mL/min — ABNORMAL LOW (ref >60)
GFR calc non Af Amer: 40 mL/min — ABNORMAL LOW (ref >60)
Glucose, Bld: 227 mg/dL — ABNORMAL HIGH (ref 70–99)
Potassium: 5.2 mmol/L — ABNORMAL HIGH (ref 3.5–5.1)
Sodium: 136 mmol/L (ref 135–145)
Total Bilirubin: 0.5 mg/dL (ref 0.3–1.2)
Total Protein: 5.9 g/dL — ABNORMAL LOW (ref 6.5–8.1)

## 2019-10-23 LAB — GLUCOSE, CAPILLARY
Glucose-Capillary: 261 mg/dL — ABNORMAL HIGH (ref 70–99)
Glucose-Capillary: 146 mg/dL — ABNORMAL HIGH (ref 70–99)
Glucose-Capillary: 152 mg/dL — ABNORMAL HIGH (ref 70–99)

## 2019-10-23 LAB — C-REACTIVE PROTEIN: CRP: 2 mg/dL — ABNORMAL HIGH (ref <1.0)

## 2019-10-23 LAB — PROCALCITONIN: Procalcitonin: <0.1 ng/mL

## 2019-10-23 LAB — FERRITIN: Ferritin: 232 ng/mL (ref 24–336)

## 2019-10-23 LAB — PHOSPHORUS: Phosphorus: 4.3 mg/dL (ref 2.5–4.6)

## 2019-10-23 LAB — MAGNESIUM: Magnesium: 2.3 mg/dL (ref 1.7–2.4)

## 2019-10-23 MED ORDER — ORAL CARE MOUTH RINSE
15.0000 mL | Freq: Two times a day (BID) | OROMUCOSAL | Status: DC
  Administered 2019-10-23 – 2019-10-26 (×7): 15 mL via OROMUCOSAL

## 2019-10-23 NOTE — Progress Notes (Addendum)
PROGRESS NOTE    Gregory [name]  [id number] DOB: 19-Mar-1951 DOA: 10/21/2019 PCP: Gregory Carol, MD   Brief Narrative:  HPI per Dr. Marthenia Rolling Patient is a 68 year old male, who works as a Art gallery manager and a Psychologist, counselling.  [name] past medical history includes coronary artery disease, MI, diabetes mellitus, CHF, hypertension, hyperlipidemia, GERD and chronic kidney disease stage IIIa.  According to the patient, he was not feeling well about 4 days prior to presentation.  Patient is a poor historian.  Patient reported having hallucinations.  However, on further questioning patient explained that he was confused and that was what he referred as hallucinations.  Patient also had a general feeling of unwell.  Patient decided to test for COVID-19 after he learned that his coworker was positive for COVID-19.  Patient was tested at an urgent care was found to be positive.  Patient was advised to come to the hospital for further assessment and management.  Patient was also noted to be hypoxic with O2 sat of 86%.  Patient is currently on 2 to 3 L of supplemental oxygen.  Inflammatory markers are elevated.  Chest x-ray revealed patchy bibasilar airspace disease that is thought to be secondary to COVID-19 pneumonia.  No headache, no neck pain, no chest pain, no fever or chills and no urinary symptoms.  Hospitalist team has been asked to admit patient for further assessment and management of the COVID-19.  ED Course: On presentation to the hospital, temperature was 98.6, heart rate of 77, respiratory rate of 26, blood pressure of 128/76 mmHg and oxygen of 86%.  Pertinent labs reveal serum creatinine of 2.49 (up from 1.04), LDH of 215, ferritin of 226, CRP of 4.8, fibrinogen of 631, D-dimer of 0.66.  Patient was given the first dose of steroids in the emergency department.   Assessment & Plan:   Principal Problem:   Acute respiratory failure due to COVID-19 [facility]) Active Problems:   Hyperlipidemia    Coronary atherosclerosis of native coronary artery   Diabetes mellitus type 2, uncontrolled (HCC)   Chronic kidney disease, stage 3   Essential hypertension   Acute on chronic renal failure (HCC)   1 acute respiratory failure with hypoxia secondary to COVID-19 pneumonia Patient had presented with complaints of feeling unwell.  Patient was seen at urgent care and Covid test was positive.  Patient noted to be hypoxic with sats in the 80s on room air.  Patient noted to have elevated inflammatory markers.  Chest x-ray which was done with bilateral infiltrates.  Patient placed on O2.  Procalcitonin was 0.21.  Improving clinically.  Patient with sats of 90-93% on 2 to 3 L nasal cannula.  Continue IV Decadron to complete a 10-day course, IV remdesivir x5 days.  Continue Claritin, Mucinex, Flonase, Atrovent and Xopenex MDIs.  Follow inflammatory markers.  Supportive care.  2.  Acute renal failure on chronic kidney disease stage III Patient noted on admission to have a creatinine of 2.49.  Baseline creatinine anywhere from 1.6-2.  Likely secondary to prerenal azotemia in the setting of ARB and diuretics.  Diuretics on hold.  Amlodipine/valsartan on hold.  Renal function improving with hydration.  Creatinine currently at 1.72.  Renal ultrasound negative for hydronephrosis, increased renal cortical echogenicity bilaterally suggestive of chronic renal parenchymal disease, bilateral renal cysts, incidentally noted heterogeneous echotexture of the partially imaged liver findings nonspecific.  Follow.  3.  Diabetes mellitus type 2 Hemoglobin A1c 7.1.  CBG of 227 on lab work this morning. Likely  component of steroids.  Continue to hold oral hypoglycemic agents.  Continue current dose of Lantus at 30 units daily.  Sliding scale insulin.   4.  Gout Resumed home regimen of allopurinol.  5.  Hypertension Blood pressure was borderline on admission and slowly improving.  Continue to hold antihypertensive medications.   Gentle hydration.  Follow.   6.  Hyperlipidemia  Continue statin.  7.  Coronary artery disease Stable.  Continue Plavix, statin.  Exforge and diuretics on hold secondary to problem #2.  Improved will resume home dose beta-blocker tomorrow if blood pressure remains stable.  8.  Hyperkalemia Repeat labs in the morning.   DVT prophylaxis: Lovenox Code Status: Full Family Communication: Updated patient.  No family at bedside. Disposition Plan: Home when clinically improved, hypoxia improved with completion of IV remdesivir.   Consultants:   None  Procedures:   Chest x-ray 10/21/2019  Renal ultrasound 10/22/2019  Antimicrobials:  None   Subjective: Patient sitting up in chair states he is ready to get back into bed.  Patient denies any significant shortness of breath.  No chest pain.  Objective: Vitals:   10/23/19 0436 10/23/19 0500 10/23/19 0600 10/23/19 0907  BP: 129/76   (!) 112/100  Pulse: 72 71 64 82  Resp: (!) 23 (!) 26 19   Temp:    98.2 F (36.8 C)  TempSrc:    Oral  SpO2: (!) 87% 90% 93% 92%  Weight:      Height:        Intake/Output Summary (Last 24 hours) at 10/23/2019 1120 Last data filed at 10/23/2019 0900 Gross per 24 hour  Intake 3129.36 ml  Output 1250 ml  Net 1879.36 ml   Filed Weights   10/22/19 1600  Weight: 102.7 kg    Examination:  General exam: NAD.  Slight pill rolling tremor right hand. Respiratory system: Decreased breath sounds in the bases otherwise clear.  No wheezing noted.  Speaking in full sentences.  Normal respiratory effort.  Cardiovascular system: Regular rate rhythm no murmurs rubs or gallops.  No JVD.  No lower extremity edema.  Gastrointestinal system: Abdomen is soft, mildly distended, nontender to palpation, positive bowel sounds.  No rebound.  No guarding.  Central nervous system: Alert and oriented. No focal neurological deficits. Extremities: Symmetric 5 x 5 power. Skin: No rashes, lesions or  ulcers Psychiatry: Judgement and insight appear normal. Mood & affect appropriate.     Data Reviewed: I have personally reviewed following labs and imaging studies  CBC: Recent Labs  Lab 10/21/19 1651 10/22/19 0622 10/23/19 0650  WBC 5.0 2.7* 5.2  NEUTROABS 3.7 2.1 4.4  HGB 16.9 16.2 15.3  HCT 51.3 48.8 46.8  MCV 94.0 92.8 91.1  PLT 148* 159 0000000   Basic Metabolic Panel: Recent Labs  Lab 10/21/19 1651 10/22/19 0622 10/23/19 0650  NA 138 135 136  K 4.8 5.3* 5.2*  CL 101 102 105  CO2 25 20* 22  GLUCOSE 123* 244* 227*  BUN 42* 49* 49*  CREATININE 2.49* 2.18* 1.72*  CALCIUM 8.4* 8.2* 8.0*  MG  --  2.3 2.3  PHOS  --  5.7* 4.3   GFR: Estimated Creatinine Clearance: 46.3 mL/min (A) (by C-G formula based on SCr of 1.72 mg/dL (H)). Liver Function Tests: Recent Labs  Lab 10/21/19 1651 10/22/19 0622 10/23/19 0650  AST 24 21 19   ALT 18 19 17   ALKPHOS 52 51 43  BILITOT 0.7 0.5 0.5  PROT 6.8 6.6 5.9*  ALBUMIN 3.6  3.1* 2.9*   No results for input(s): LIPASE, AMYLASE in the last 168 hours. No results for input(s): AMMONIA in the last 168 hours. Coagulation Profile: No results for input(s): INR, PROTIME in the last 168 hours. Cardiac Enzymes: No results for input(s): CKTOTAL, CKMB, CKMBINDEX, TROPONINI in the last 168 hours. BNP (last 3 results) No results for input(s): PROBNP in the last 8760 hours. HbA1C: Recent Labs    10/22/19 0622  HGBA1C 7.1*   CBG: Recent Labs  Lab 10/22/19 0248 10/22/19 1213 10/22/19 1828 10/22/19 2113  GLUCAP 235* 265* 205* 175*   Lipid Profile: Recent Labs    10/21/19 1651  TRIG 136   Thyroid Function Tests: No results for input(s): TSH, T4TOTAL, FREET4, T3FREE, THYROIDAB in the last 72 hours. Anemia Panel: Recent Labs    10/22/19 0622 10/23/19 0650  FERRITIN 258 232   Sepsis Labs: Recent Labs  Lab 10/21/19 1651 10/22/19 1830 10/23/19 0650  PROCALCITON 0.21 0.12 <0.10  LATICACIDVEN 1.1  --   --     Recent  Results (from the past 240 hour(s))  Blood Culture (routine x 2)     Status: None (Preliminary result)   Collection Time: 10/21/19  4:52 PM   Specimen: BLOOD  Result Value Ref Range Status   Specimen Description BLOOD RIGHT ANTECUBITAL  Final   Special Requests   Final    BOTTLES DRAWN AEROBIC AND ANAEROBIC Blood Culture results may not be optimal due to an inadequate volume of blood received in culture bottles   Culture   Final    NO GROWTH 2 DAYS Performed at [facility] [address] [place], [place] [address]    Report Status PENDING  Incomplete  Blood Culture (routine x 2)     Status: None (Preliminary result)   Collection Time: 10/21/19  4:52 PM   Specimen: BLOOD RIGHT HAND  Result Value Ref Range Status   Specimen Description BLOOD RIGHT HAND  Final   Special Requests   Final    BOTTLES DRAWN AEROBIC AND ANAEROBIC Blood Culture results may not be optimal due to an inadequate volume of blood received in culture bottles   Culture   Final    NO GROWTH 2 DAYS Performed at [facility], [place] [address], [place], [place] [address]    Report Status PENDING  Incomplete  Respiratory Panel by PCR     Status: None   Collection Time: 10/23/19  7:09 AM   Specimen: Nasopharyngeal Swab; Respiratory  Result Value Ref Range Status   Adenovirus NOT DETECTED NOT DETECTED Final   Coronavirus 229E NOT DETECTED NOT DETECTED Final    Comment: (NOTE) The Coronavirus on the Respiratory Panel, DOES NOT test for the novel  Coronavirus (2019 nCoV)    Coronavirus HKU1 NOT DETECTED NOT DETECTED Final   Coronavirus NL63 NOT DETECTED NOT DETECTED Final   Coronavirus OC43 NOT DETECTED NOT DETECTED Final   Metapneumovirus NOT DETECTED NOT DETECTED Final   Rhinovirus / Enterovirus NOT DETECTED NOT DETECTED Final   Influenza A NOT DETECTED NOT DETECTED Final   Influenza B NOT DETECTED NOT DETECTED Final   Parainfluenza Virus 1 NOT DETECTED NOT DETECTED Final   Parainfluenza Virus 2 NOT  DETECTED NOT DETECTED Final   Parainfluenza Virus 3 NOT DETECTED NOT DETECTED Final   Parainfluenza Virus 4 NOT DETECTED NOT DETECTED Final   Respiratory Syncytial Virus NOT DETECTED NOT DETECTED Final   Bordetella pertussis NOT DETECTED NOT DETECTED Final   Chlamydophila  pneumoniae NOT DETECTED NOT DETECTED Final   Mycoplasma pneumoniae NOT DETECTED NOT DETECTED Final    Comment: Performed at [facility], [place] [address] [place], [place] [address]         Radiology Studies: DG Chest 2 View  Result Date: 10/21/2019 CLINICAL DATA:  Short of breath.  COVID-19 positive EXAM: CHEST - 2 VIEW COMPARISON:  02/22/2015 FINDINGS: Heart size and vascularity normal Streaky lung markings are present in the bases bilaterally. These were not present on the prior study. No effusion. IMPRESSION: Patchy bibasilar airspace disease. Probable pneumonia given the COVID-19 history. Electronically Signed   By: Franchot Gallo M.D.   On: 10/21/2019 11:54   US Renal  Result Date: 10/22/2019 CLINICAL DATA:  Acute renal failure. EXAM: RENAL / URINARY TRACT ULTRASOUND COMPLETE COMPARISON:  Abdominal ultrasound 02/08/2015, CT abdomen/pelvis 08/18/2019 FINDINGS: Right Kidney: Renal measurements: 12.4 x 5.1 x 5.5 cm = volume: 181.3 mL. No hydronephrosis. Increased renal cortical echogenicity. 2.0 x 1.5 x 1.7 cm interpolar renal cyst. 2.5 x 1.4 x 1.3 cm lower pole renal cyst. Left Kidney: Renal measurements: 12.5 x 6.1 x 5.8 cm = volume: 230.9 mL. No hydronephrosis. Increased renal cortical echogenicity. 3.0 x 2.5 x 1.7 cm upper pole renal cyst. 2.0 x 1.9 x 1.8 cm lower pole renal cyst. Bladder: Appears normal for degree of bladder distention. Other: Incidentally noted, there is heterogeneous echotexture of the partially imaged liver. IMPRESSION: No hydronephrosis. Increased renal cortical echogenicity bilaterally suggestive of chronic renal parenchymal disease. Bilateral renal cysts. Incidentally noted heterogeneous  echotexture of the partially imaged liver. Findings are nonspecific, but may be seen in the setting of hepatic steatosis or other chronic hepatic parenchymal disease. Electronically Signed   By: Kellie Simmering DO   On: 10/22/2019 10:53        Scheduled Meds: . AeroChamber Plus Flo-Vu Medium  1 each Other Once  . vitamin C  500 mg Oral Daily  . atorvastatin  10 mg Oral q1800  . clopidogrel  75 mg Oral Daily  . dexamethasone (DECADRON) injection  6 mg Intravenous Q24H  . enoxaparin (LOVENOX) injection  40 mg Subcutaneous Q24H  . fluticasone  2 spray Each Nare Daily  . folic acid  1 mg Oral Daily  . guaiFENesin  1,200 mg Oral BID  . insulin aspart  0-5 Units Subcutaneous QHS  . insulin aspart  0-6 Units Subcutaneous TID WC  . insulin glargine  30 Units Subcutaneous q morning - 10a  . ipratropium  2 puff Inhalation Q6H  . levalbuterol  2 puff Inhalation Q6H  . loratadine  10 mg Oral Daily  . mouth rinse  15 mL Mouth Rinse BID  . multivitamin with minerals  1 tablet Oral Daily  . thiamine  100 mg Oral Daily  . zinc sulfate  220 mg Oral Daily   Continuous Infusions: . remdesivir 100 mg in NS 100 mL 100 mg (10/23/19 0940)     LOS: 2 days    Time spent: 40 minutes    Irine Seal, MD [facility]  If 7PM-7AM, please contact night-coverage [email or web address] 10/23/2019, 11:20 AM

## 2019-10-23 NOTE — Evaluation (Signed)
Occupational Therapy Evaluation Patient Details Name: Gregory [name] MRN: [id number] DOB: 10-31-1950 Today's Date: 10/23/2019    History of Present Illness 68 year old male, who works as a Art gallery manager and a Psychologist, counselling.  [name] past medical history includes coronary artery disease, MI, diabetes mellitus, CHF, hypertension, chronic kidney disease stage IIIa, back surgery. 10/21/19 tested + for COVID 19 at urgent care and transferred to ED due to hypoxemia. +COVID pna   Clinical Impression   Pt admitted with above. He demonstrates the below listed deficits and will benefit from continued OT to maximize safety and independence with BADLs.  Pt presents to OT with generalized weakness, and decreased activity tolerance.  He currently requires supervision for ADLs with 02 sats decreasing to 89% with activity, but rebounding quickly to low 90s on 3L supplemental 02.  He lives alone and was fully independent PTA, including working as a Teacher, music.  Encouraged him to walk with nsg over the weekend.  Anticipate good progress.       Follow Up Recommendations  No OT follow up;Supervision - Intermittent    Equipment Recommendations  None recommended by OT    Recommendations for Other Services       Precautions / Restrictions Precautions Precautions: Fall Restrictions Weight Bearing Restrictions: No      Mobility Bed Mobility Overal bed mobility: Needs Assistance Bed Mobility: Sit to Supine;Supine to Sit     Supine to sit: Supervision Sit to supine: Supervision   General bed mobility comments: increased time and effort   Transfers Overall transfer level: Independent Equipment used: None Transfers: Sit to/from Stand Sit to Stand: Independent         General transfer comment: supervision only for lines/O2/monitor; from recliner and low toilet    Balance Overall balance assessment: Mild deficits observed, not formally tested                                          ADL either performed or assessed with clinical judgement   ADL Overall ADL's : Needs assistance/impaired Eating/Feeding: Independent   Grooming: Wash/dry hands;Wash/dry face;Oral care;Brushing hair;Supervision/safety;Standing   Upper Body Bathing: Set up;Sitting   Lower Body Bathing: Supervison/ safety;Sit to/from stand   Upper Body Dressing : Set up;Sitting   Lower Body Dressing: Supervision/safety;Sit to/from stand   Toilet Transfer: Supervision/safety;Ambulation;Comfort height toilet;Grab bars   Toileting- Clothing Manipulation and Hygiene: Supervision/safety;Sit to/from stand       Functional mobility during ADLs: Supervision/safety General ADL Comments: condom catheter came off in bathroom while pt attempting to have BM causing pt to accidentally urinate on himself and the floor.  Assisted him with clean up, and he was able to perform LB bathing with supervision      Vision         Perception     Praxis      Pertinent Vitals/Pain Pain Assessment: No/denies pain     Hand Dominance Right   Extremity/Trunk Assessment Upper Extremity Assessment Upper Extremity Assessment: Generalized weakness   Lower Extremity Assessment Lower Extremity Assessment: Generalized weakness   Cervical / Trunk Assessment Cervical / Trunk Assessment: Other exceptions Cervical / Trunk Exceptions: obesity   Communication Communication Communication: No difficulties   Cognition Arousal/Alertness: Awake/alert Behavior During Therapy: WFL for tasks assessed/performed Overall Cognitive Status: Within Functional Limits for tasks assessed  General Comments  sats down to 89% with activity on 3L supplemental 02, but rebounds into low 90s quickly with standing rest break.  Instructed him to walk with nursing over the weekend     Exercises     Shoulder Instructions      Home Living Family/patient  expects to be discharged to:: Private residence Living Arrangements: Alone   Type of Home: House Home Access: Stairs to enter;Ramped entrance     [place]: Laundry or work area in basement;Two level Alternate Level Stairs-Number of Steps: 16 Alternate Level Stairs-Rails: Right Bathroom Shower/Tub: Occupational psychologist: Handicapped height     Home Equipment: Grab bars - toilet;Grab bars - tub/shower;Shower seat - built in          Prior Functioning/Environment Level of Independence: Independent        Comments: works as a Art gallery manager 5 days/week; 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        OT Problem List: Decreased strength;Decreased activity tolerance;Cardiopulmonary status limiting activity      OT Treatment/Interventions: Self-care/ADL training;Therapeutic exercise;Therapeutic activities;Patient/family education;DME and/or AE instruction    OT Goals(Current goals can be found in the care plan section) Acute Rehab OT Goals Patient Stated Goal: to get home and get back to work  OT Goal Formulation: With patient Time For Goal Achievement: 11/06/19 Potential to Achieve Goals: Good ADL Goals Pt Will Perform Grooming: (P) with modified independence;standing Pt Will Perform Upper Body Bathing: (P) with modified independence;sitting;standing Pt Will Perform Lower Body Bathing: (P) with modified independence;sit to/from stand Pt Will Perform Upper Body Dressing: (P) with modified independence;sitting;standing Pt Will Perform Lower Body Dressing: (P) with modified independence;sitting/lateral leans Pt Will Transfer to Toilet: (P) with modified independence;ambulating;regular height toilet;bedside commode;grab bars Pt Will Perform Toileting - Clothing Manipulation and hygiene: (P) with modified independence;sit to/from stand  OT Frequency: Min 2X/week   Barriers to D/C: Decreased caregiver support          Co-evaluation              AM-PAC OT "6 Clicks" Daily Activity     Outcome  Measure Help from another person eating meals?: None Help from another person taking care of personal grooming?: A Little Help from another person toileting, which includes using toliet, bedpan, or urinal?: A Little Help from another person bathing (including washing, rinsing, drying)?: A Little Help from another person to put on and taking off regular upper body clothing?: A Little Help from another person to put on and taking off regular lower body clothing?: A Little 6 Click Score: 19   End of Session Equipment Utilized During Treatment: Oxygen Nurse Communication: Mobility status  Activity Tolerance: Patient tolerated treatment well Patient left: in bed;with call bell/phone within reach;with nursing/sitter in room  OT Visit Diagnosis: Muscle weakness (generalized) (M62.81)                Time: [id number] OT Time Calculation (min): 70 min Charges:  OT General Charges $OT Visit: 1 Visit OT Evaluation $OT Eval Moderate Complexity: 1 Mod OT Treatments $Self Care/Home Management : 38-52 mins $Therapeutic Activity: 8-22 mins  Nilsa Nutting., OTR/L Acute Rehabilitation Services Pager [phone] Office [phone]   Lucille Passy M 10/23/2019, 6:13 PM

## 2019-10-23 NOTE — Evaluation (Signed)
Physical Therapy Evaluation Patient Details Name: Gregory [name] MRN: [id number] DOB: [birth year] Today's Date: 10/23/2019   History of Present Illness  68 year old male, who works as a Art gallery manager and a Psychologist, counselling.  [name] past medical history includes coronary artery disease, MI, diabetes mellitus, CHF, hypertension, chronic kidney disease stage IIIa, back surgery. 10/21/19 tested + for COVID 19 at urgent care and transferred to ED due to hypoxemia. +COVID pna  Clinical Impression   Pt admitted with above diagnosis. Patient lives alone (ramped entry) and typically very independent--still works 5 days/week as a Art gallery manager. He required 3L of O2 to keep sats 86-92% while walking. He required 5 standing rest breaks over 200 ft. Mildly unsteady, but did not require physical assistance or external support.  Pt currently with functional limitations due to the deficits listed below (see PT Problem List). Pt will benefit from skilled PT to increase their independence and safety with mobility to allow discharge to the venue listed below.    NOTE-If pt requires home O2, there is safety concern with O2 tubing as tripping hazard. Will need to educate on strategies when walking with long tubing to simulate home environment.    Follow Up Recommendations Home health PT;Supervision - Intermittent    Equipment Recommendations  None recommended by PT(may need to try RW or cane)    Recommendations for Other Services OT consult     Precautions / Restrictions Precautions Precautions: Fall Restrictions Weight Bearing Restrictions: No      Mobility  Bed Mobility               General bed mobility comments: up in recliner  Transfers Overall transfer level: Independent Equipment used: None Transfers: Sit to/from Stand Sit to Stand: Independent         General transfer comment: supervision only for lines/O2/monitor; from recliner and low toilet  Ambulation/Gait Ambulation/Gait  assistance: Min guard Gait Distance (Feet): 200 Feet Assistive device: None Gait Pattern/deviations: Step-through pattern;Decreased stride length;Wide base of support     General Gait Details: pt initially walking his normal speed with sats dropping to 86% on 3L; standing rest break would incr >88% after 30 seconds; educated to walk more slowly with slight unsteadiness noted; educatecd to walk with wider BOS for incr stability; over 200 ft required 5 standing rest breaks all <60 seconds  Stairs            Wheelchair Mobility    Modified Rankin (Stroke Patients Only)       Balance Overall balance assessment: Mild deficits observed, not formally tested                                           Pertinent Vitals/Pain Pain Assessment: No/denies pain    Home Living Family/patient expects to be discharged to:: Private residence Living Arrangements: Alone   Type of Home: House Home Access: Stairs to enter;Ramped entrance     Home Layout: Laundry or work area in basement;Two level Home Equipment: Grab bars - toilet;Grab bars - tub/shower;Shower seat - built in      Prior Function Level of Independence: Independent         Comments: works as a Art gallery manager 5 days/week; 7a-5p     Journalist, newspaper        Extremity/Trunk Assessment   Upper Extremity Assessment Upper Extremity Assessment: Defer to OT evaluation  Cervical / Trunk Assessment Cervical / Trunk Assessment: Other exceptions Cervical / Trunk Exceptions: obesity  Communication   Communication: No difficulties  Cognition Arousal/Alertness: Awake/alert Behavior During Therapy: WFL for tasks assessed/performed Overall Cognitive Status: Within Functional Limits for tasks assessed                                        General Comments General comments (skin integrity, edema, etc.): Educated on positioning to improve lung function; provided IS with brief education (pt  bathing with NT); and encouraged pt to ask to walk again with nursing    Exercises     Assessment/Plan    PT Assessment Patient needs continued PT services  PT Problem List Decreased activity tolerance;Decreased balance;Decreased mobility;Decreased knowledge of use of DME;Cardiopulmonary status limiting activity;Obesity       PT Treatment Interventions DME instruction;Gait training;Functional mobility training;Therapeutic activities;Therapeutic exercise;Balance training;Neuromuscular re-education;Patient/family education    PT Goals (Current goals can be found in the Care Plan section)  Acute Rehab PT Goals Patient Stated Goal: return home without needing oxygen PT Goal Formulation: With patient Time For Goal Achievement: 11/06/19 Potential to Achieve Goals: Good    Frequency Min 3X/week   Barriers to discharge Decreased caregiver support lives alone    Co-evaluation               AM-PAC PT "6 Clicks" Mobility  Outcome Measure Help needed turning from your back to your side while in a flat bed without using bedrails?: None Help needed moving from lying on your back to sitting on the side of a flat bed without using bedrails?: A Little Help needed moving to and from a bed to a chair (including a wheelchair)?: None Help needed standing up from a chair using your arms (e.g., wheelchair or bedside chair)?: None Help needed to walk in hospital room?: A Little Help needed climbing 3-5 steps with a railing? : A Little 6 Click Score: 21    End of Session Equipment Utilized During Treatment: Oxygen Activity Tolerance: Treatment limited secondary to medical complications (Comment) Patient left: in chair;with call bell/phone within reach;with nursing/sitter in room Nurse Communication: Mobility status;Other (comment)(sat levels; 3L O2; needs IS education) PT Visit Diagnosis: Unsteadiness on feet (R26.81);Difficulty in walking, not elsewhere classified (R26.2)    Time:  [id number] PT Time Calculation (min) (ACUTE ONLY): 42 min   Charges:   PT Evaluation $PT Eval Low Complexity: 1 Low PT Treatments $Gait Training: 8-22 mins $Self Care/Home Management: 8-22         Arby Barrette, PT Pager [phone]   Rexanne Mano 10/23/2019, 1:00 PM

## 2019-10-24 LAB — D-DIMER, QUANTITATIVE: D-Dimer, Quant: 0.39 ug/mL-FEU (ref 0.00–0.50)

## 2019-10-24 LAB — GLUCOSE, CAPILLARY
Glucose-Capillary: 133 mg/dL — ABNORMAL HIGH (ref 70–99)
Glucose-Capillary: 181 mg/dL — ABNORMAL HIGH (ref 70–99)
Glucose-Capillary: 156 mg/dL — ABNORMAL HIGH (ref 70–99)
Glucose-Capillary: 170 mg/dL — ABNORMAL HIGH (ref 70–99)

## 2019-10-24 LAB — COMPREHENSIVE METABOLIC PANEL
ALT: 18 U/L (ref 0–44)
AST: 20 U/L (ref 15–41)
Albumin: 3 g/dL — ABNORMAL LOW (ref 3.5–5.0)
Alkaline Phosphatase: 40 U/L (ref 38–126)
Anion gap: 8 (ref 5–15)
BUN: 45 mg/dL — ABNORMAL HIGH (ref 8–23)
CO2: 24 mmol/L (ref 22–32)
Calcium: 8.2 mg/dL — ABNORMAL LOW (ref 8.9–10.3)
Chloride: 107 mmol/L (ref 98–111)
Creatinine, Ser: 1.54 mg/dL — ABNORMAL HIGH (ref 0.61–1.24)
GFR calc Af Amer: 53 mL/min — ABNORMAL LOW (ref >60)
GFR calc non Af Amer: 45 mL/min — ABNORMAL LOW (ref >60)
Glucose, Bld: 140 mg/dL — ABNORMAL HIGH (ref 70–99)
Potassium: 5.3 mmol/L — ABNORMAL HIGH (ref 3.5–5.1)
Sodium: 139 mmol/L (ref 135–145)
Total Bilirubin: 0.7 mg/dL (ref 0.3–1.2)
Total Protein: 5.8 g/dL — ABNORMAL LOW (ref 6.5–8.1)

## 2019-10-24 LAB — CBC WITH DIFFERENTIAL/PLATELET
Abs Immature Granulocytes: 0.03 10*3/uL (ref 0.00–0.07)
Basophils Absolute: 0 10*3/uL (ref 0.0–0.1)
Basophils Relative: 0 %
Eosinophils Absolute: 0 10*3/uL (ref 0.0–0.5)
Eosinophils Relative: 0 %
HCT: 47 % (ref 39.0–52.0)
Hemoglobin: 15.7 g/dL (ref 13.0–17.0)
Immature Granulocytes: 0 %
Lymphocytes Relative: 8 %
Lymphs Abs: 0.5 10*3/uL — ABNORMAL LOW (ref 0.7–4.0)
MCH: 30.3 pg (ref 26.0–34.0)
MCHC: 33.4 g/dL (ref 30.0–36.0)
MCV: 90.7 fL (ref 80.0–100.0)
Monocytes Absolute: 0.3 10*3/uL (ref 0.1–1.0)
Monocytes Relative: 4 %
Neutro Abs: 5.8 10*3/uL (ref 1.7–7.7)
Neutrophils Relative %: 88 %
Platelets: 158 10*3/uL (ref 150–400)
RBC: 5.18 MIL/uL (ref 4.22–5.81)
RDW: 12.7 % (ref 11.5–15.5)
WBC: 6.7 10*3/uL (ref 4.0–10.5)
nRBC: 0 % (ref 0.0–0.2)

## 2019-10-24 LAB — PHOSPHORUS: Phosphorus: 3.3 mg/dL (ref 2.5–4.6)

## 2019-10-24 LAB — C-REACTIVE PROTEIN: CRP: 1 mg/dL — ABNORMAL HIGH (ref <1.0)

## 2019-10-24 LAB — MAGNESIUM: Magnesium: 2.3 mg/dL (ref 1.7–2.4)

## 2019-10-24 LAB — POTASSIUM: Potassium: 4.6 mmol/L (ref 3.5–5.1)

## 2019-10-24 LAB — PROCALCITONIN: Procalcitonin: <0.1 ng/mL

## 2019-10-24 LAB — FERRITIN: Ferritin: 211 ng/mL (ref 24–336)

## 2019-10-24 MED ORDER — SODIUM ZIRCONIUM CYCLOSILICATE 5 G PO PACK
5.0000 g | PACK | Freq: Once | ORAL | Status: AC
  Administered 2019-10-24: 5 g via ORAL
  Filled 2019-10-24 (×3): qty 1

## 2019-10-24 MED ORDER — FUROSEMIDE 10 MG/ML IJ SOLN
20.0000 mg | Freq: Once | INTRAMUSCULAR | Status: AC
  Administered 2019-10-24: 20 mg via INTRAVENOUS
  Filled 2019-10-24: qty 2

## 2019-10-24 MED ORDER — METOPROLOL TARTRATE 12.5 MG HALF TABLET: 12.5000 mg | ORAL_TABLET | Freq: Two times a day (BID) | ORAL | Status: DC

## 2019-10-24 MED ORDER — METOPROLOL TARTRATE 12.5 MG HALF TABLET
12.5000 mg | ORAL_TABLET | Freq: Two times a day (BID) | ORAL | Status: DC
  Administered 2019-10-24 – 2019-10-26 (×4): 12.5 mg via ORAL
  Filled 2019-10-24 (×4): qty 1

## 2019-10-24 NOTE — Progress Notes (Signed)
Central Tele notified this RN of 7 beat run of V-Tac. Pt asymptomatic. Dr. Grandville Silos notified with no new orders

## 2019-10-24 NOTE — Progress Notes (Signed)
Spoke with pharmacy regarding Lokelma(missing dose). Pharmacy to make tube med up as soon as ready.

## 2019-10-24 NOTE — Progress Notes (Signed)
Physical Therapy Treatment Patient Details Name: Gregory [name] MRN: [id number] DOB: October 06, 1951 Today's Date: 10/24/2019    History of Present Illness 69 year old male, who works as a Art gallery manager and a Psychologist, counselling.  [name] past medical history includes coronary artery disease, MI, diabetes mellitus, CHF, hypertension, chronic kidney disease stage IIIa, back surgery. 10/21/19 tested + for COVID 19 at urgent care and transferred to ED due to hypoxemia. +COVID pna    PT Comments    Pt sleeping on entry, but wakes easily and is agreeable to therapy. Pt is currently limited in safe mobility by oxygen dependence and mild weakness. Pt is mod I for bed mobility and transfers and min guard for ambulation of 250 feet with RW. Verbal cues required for management of lines. Educated pt on probable need for supplemental O2 at discharge and need for safety with O2 line. Pt now recommending RW for discharge. PT will continue to follow acutely.   Follow Up Recommendations  Home health PT;Supervision - Intermittent     Equipment Recommendations  Rolling walker with 5" wheels    Recommendations for Other Services OT consult     Precautions / Restrictions Precautions Precautions: Fall Restrictions Weight Bearing Restrictions: No    Mobility  Bed Mobility Overal bed mobility: Needs Assistance Bed Mobility: Supine to Sit     Supine to sit: Modified independent (Device/Increase time)     General bed mobility comments: increased time and effort   Transfers Overall transfer level: Independent Equipment used: None Transfers: Sit to/from Stand Sit to Stand: Modified independent (Device/Increase time)         General transfer comment: mod I for assist with lines   Ambulation/Gait Ambulation/Gait assistance: Min guard Gait Distance (Feet): 250 Feet Assistive device: Rolling walker (2 wheeled) Gait Pattern/deviations: Step-through pattern;Decreased stride length;Wide base of  support     General Gait Details: min guard for safety with slow, steady gait, requiring only one standing rest break to recover breath from 88%O2 with 3L O2 via [place]       Balance Overall balance assessment: Mild deficits observed, not formally tested                                          Cognition Arousal/Alertness: Awake/alert Behavior During Therapy: WFL for tasks assessed/performed Overall Cognitive Status: Within Functional Limits for tasks assessed                                           General Comments General comments (skin integrity, edema, etc.): pt on 3L O2 on entry with SaO2 96%O2, able to ambulate with SaO2 only dropping to 88%O2 x1 which recovered within 20 sec of pursed lipped breathing      Pertinent Vitals/Pain Pain Assessment: No/denies pain           PT Goals (current goals can now be found in the care plan section) Acute Rehab PT Goals Patient Stated Goal: return home without needing oxygen PT Goal Formulation: With patient Time For Goal Achievement: 11/06/19 Potential to Achieve Goals: Good Progress towards PT goals: Progressing toward goals    Frequency    Min 3X/week      PT Plan Equipment recommendations need to be updated    Co-evaluation  AM-PAC PT "6 Clicks" Mobility   Outcome Measure  Help needed turning from your back to your side while in a flat bed without using bedrails?: None Help needed moving from lying on your back to sitting on the side of a flat bed without using bedrails?: A Little Help needed moving to and from a bed to a chair (including a wheelchair)?: None Help needed standing up from a chair using your arms (e.g., wheelchair or bedside chair)?: None Help needed to walk in hospital room?: A Little Help needed climbing 3-5 steps with a railing? : A Little 6 Click Score: 21    End of Session Equipment Utilized During Treatment: Oxygen Activity Tolerance:  Patient tolerated treatment well Patient left: in chair;with call bell/phone within reach;with nursing/sitter in room Nurse Communication: Mobility status;Other (comment)(sat levels; 3L O2; needs IS education) PT Visit Diagnosis: Unsteadiness on feet (R26.81);Difficulty in walking, not elsewhere classified (R26.2)     Time: [id number] PT Time Calculation (min) (ACUTE ONLY): 30 min  Charges:  $Gait Training: 23-37 mins                     Emanuelle Bastos B. Migdalia Dk PT, DPT Acute Rehabilitation Services Pager [phone] Office [phone]    [place] 10/24/2019, 2:18 PM

## 2019-10-24 NOTE — Progress Notes (Addendum)
PROGRESS NOTE    Gregory [name]  [id number] DOB: August 12, 1951 DOA: 10/21/2019 PCP: Seward Carol, MD   Brief Narrative:  HPI per Dr. Marthenia Rolling Patient is a 69 year old male, who works as a Art gallery manager and a Psychologist, counselling.  [name] past medical history includes coronary artery disease, MI, diabetes mellitus, CHF, hypertension, hyperlipidemia, GERD and chronic kidney disease stage IIIa.  According to the patient, he was not feeling well about 4 days prior to presentation.  Patient is a poor historian.  Patient reported having hallucinations.  However, on further questioning patient explained that he was confused and that was what he referred as hallucinations.  Patient also had a general feeling of unwell.  Patient decided to test for COVID-19 after he learned that his coworker was positive for COVID-19.  Patient was tested at an urgent care was found to be positive.  Patient was advised to come to the hospital for further assessment and management.  Patient was also noted to be hypoxic with O2 sat of 86%.  Patient is currently on 2 to 3 L of supplemental oxygen.  Inflammatory markers are elevated.  Chest x-ray revealed patchy bibasilar airspace disease that is thought to be secondary to COVID-19 pneumonia.  No headache, no neck pain, no chest pain, no fever or chills and no urinary symptoms.  Hospitalist team has been asked to admit patient for further assessment and management of the COVID-19.  ED Course: On presentation to the hospital, temperature was 98.6, heart rate of 77, respiratory rate of 26, blood pressure of 128/76 mmHg and oxygen of 86%.  Pertinent labs reveal serum creatinine of 2.49 (up from 1.04), LDH of 215, ferritin of 226, CRP of 4.8, fibrinogen of 631, D-dimer of 0.66.  Patient was given the first dose of steroids in the emergency department.   Assessment & Plan:   Principal Problem:   Acute respiratory failure due to COVID-19 [facility]) Active Problems:   Hyperlipidemia    Coronary atherosclerosis of native coronary artery   Diabetes mellitus type 2, uncontrolled (HCC)   Chronic kidney disease, stage 3   Essential hypertension   Acute on chronic renal failure (HCC)   1 acute respiratory failure with hypoxia secondary to COVID-19 pneumonia Patient had presented with complaints of feeling unwell.  Patient was seen at urgent care and Covid test was positive.  Patient noted to be hypoxic with sats in the 80s on room air.  Patient noted to have elevated inflammatory markers.  Chest x-ray which was done with bilateral infiltrates.  Patient placed on O2.  Procalcitonin was 0.21.  Improving clinically.  Patient with sats of 89% on room air.  Currently on 2 to 3 L nasal cannula with sats in the low 90s.  Continue IV Decadron to complete a 10-day course, IV remdesivir x5 days.  Continue Claritin, Mucinex, Flonase, Atrovent and Xopenex MDIs.  We will give Lasix 20 mg IV x1 and likely resume home dose oral Lasix tomorrow.  Follow inflammatory markers.  Supportive care.  2.  Acute renal failure on chronic kidney disease stage III Patient noted on admission to have a creatinine of 2.49.  Baseline creatinine anywhere from 1.6-2.  Likely secondary to prerenal azotemia in the setting of ARB and diuretics.  Diuretics on hold.  Amlodipine/valsartan on hold.  Renal function improving with hydration.  IV fluids have been saline locked.  Creatinine currently at 1.54.  Renal ultrasound negative for hydronephrosis, increased renal cortical echogenicity bilaterally suggestive of chronic renal parenchymal disease, bilateral  renal cysts, incidentally noted heterogeneous echotexture of the partially imaged liver findings nonspecific.  Resume home dose Lasix tomorrow.  Follow.  3.  Diabetes mellitus type 2 Hemoglobin A1c 7.1.  CBG of 133 on lab work this morning. Likely component of steroids.  Continue to hold oral hypoglycemic agents.  Continue current dose of Lantus at 30 units daily.  Sliding  scale insulin.   4.  Gout Resumed home regimen of allopurinol.  5.  Hypertension Blood pressure was borderline on admission and slowly improving.  Resume home dose Lasix tomorrow.  Resume Lopressor 12.5 mg twice daily.   Follow.   6.  Hyperlipidemia  Continue statin.  7.  Coronary artery disease Stable.  Continue Plavix, statin.  Exforge and diuretics on hold secondary to problem #2.  We will resume home dose oral Lasix tomorrow.  Resume Lopressor 12.5 mg twice daily.  Follow.   8.  Hyperkalemia We will give a dose of Lokelma.  Repeat potassium after Lokelma.  Follow.  9.  Nonsustained V. tach Per RN patient with a 7 beat run of asymptomatic nonsustained V. tach.  Potassium at 5.3.  Magnesium at 2.3.  Will resume patient's Lopressor 12.5 mg twice daily due to presentation with borderline blood pressure and could uptitrate back to home dose of Lopressor 100 mg twice daily.  Follow.   DVT prophylaxis: Lovenox Code Status: Full Family Communication: Updated patient.  No family at bedside. Disposition Plan: Home when clinically improved, hypoxia improved with completion of IV remdesivir.   Consultants:   None  Procedures:   Chest x-ray 10/21/2019  Renal ultrasound 10/22/2019  Antimicrobials:  None  COVID-19 Labs  Recent Labs    10/22/19 0622 10/23/19 0650 10/24/19 0346  DDIMER 0.60* 0.45 0.39  FERRITIN 258 232 211  CRP 3.8* 2.0* 1.0*    No results found for: SARSCOV2NAA   Subjective: Patient noted to have ambulated in the with physical therapy.  Sitting in chair.  Denies any significant shortness of breath.  Feeling better.  No chest pain.   Objective: Vitals:   10/24/19 0444 10/24/19 0500 10/24/19 0700 10/24/19 0800  BP: (!) 144/88  138/90   Pulse: 65  60 67  Resp: (!) 22  20 (!) 24  Temp: 98.7 F (37.1 C)  97.9 F (36.6 C)   TempSrc: Oral  Oral   SpO2: 93%  94% 92%  Weight:  101.1 kg    Height:        Intake/Output Summary (Last 24 hours) at  10/24/2019 1208 Last data filed at 10/24/2019 0900 Gross per 24 hour  Intake 940 ml  Output 2011 ml  Net -1071 ml   Filed Weights   10/22/19 1600 10/24/19 0500  Weight: 102.7 kg 101.1 kg    Examination:  General exam: NAD.  Respiratory system: Decreased breath sounds in the bases otherwise clear.  No wheezing.  Speaking in full sentences.  Normal respiratory effort.  Cardiovascular system: RRR no murmurs rubs or gallops.  No JVD.  No lower extremity edema.  Gastrointestinal system: Abdomen is mildly distended, soft, nontender to palpation, positive bowel sounds.  No rebound.  No guarding.  Central nervous system: Alert and oriented. No focal neurological deficits. Extremities: Symmetric 5 x 5 power. Skin: No rashes, lesions or ulcers Psychiatry: Judgement and insight appear normal. Mood & affect appropriate.     Data Reviewed: I have personally reviewed following labs and imaging studies  CBC: Recent Labs  Lab 10/21/19 1651 10/22/19 0622 10/23/19 0650 10/24/19  0346  WBC 5.0 2.7* 5.2 6.7  NEUTROABS 3.7 2.1 4.4 5.8  HGB 16.9 16.2 15.3 15.7  HCT 51.3 48.8 46.8 47.0  MCV 94.0 92.8 91.1 90.7  PLT 148* 159 153 0000000   Basic Metabolic Panel: Recent Labs  Lab 10/21/19 1651 10/22/19 0622 10/23/19 0650 10/24/19 0346  NA 138 135 136 139  K 4.8 5.3* 5.2* 5.3*  CL 101 102 105 107  CO2 25 20* 22 24  GLUCOSE 123* 244* 227* 140*  BUN 42* 49* 49* 45*  CREATININE 2.49* 2.18* 1.72* 1.54*  CALCIUM 8.4* 8.2* 8.0* 8.2*  MG  --  2.3 2.3 2.3  PHOS  --  5.7* 4.3 3.3   GFR: Estimated Creatinine Clearance: 51.3 mL/min (A) (by C-G formula based on SCr of 1.54 mg/dL (H)). Liver Function Tests: Recent Labs  Lab 10/21/19 1651 10/22/19 0622 10/23/19 0650 10/24/19 0346  AST 24 21 19 20   ALT 18 19 17 18   ALKPHOS 52 51 43 40  BILITOT 0.7 0.5 0.5 0.7  PROT 6.8 6.6 5.9* 5.8*  ALBUMIN 3.6 3.1* 2.9* 3.0*   No results for input(s): LIPASE, AMYLASE in the last 168 hours. No results for  input(s): AMMONIA in the last 168 hours. Coagulation Profile: No results for input(s): INR, PROTIME in the last 168 hours. Cardiac Enzymes: No results for input(s): CKTOTAL, CKMB, CKMBINDEX, TROPONINI in the last 168 hours. BNP (last 3 results) No results for input(s): PROBNP in the last 8760 hours. HbA1C: Recent Labs    10/22/19 0622  HGBA1C 7.1*   CBG: Recent Labs  Lab 10/23/19 1205 10/23/19 1604 10/23/19 2132 10/24/19 0743 10/24/19 1146  GLUCAP 261* 146* 152* 133* 181*   Lipid Profile: Recent Labs    10/21/19 1651  TRIG 136   Thyroid Function Tests: No results for input(s): TSH, T4TOTAL, FREET4, T3FREE, THYROIDAB in the last 72 hours. Anemia Panel: Recent Labs    10/23/19 0650 10/24/19 0346  FERRITIN 232 211   Sepsis Labs: Recent Labs  Lab 10/21/19 1651 10/22/19 1830 10/23/19 0650 10/24/19 0346  PROCALCITON 0.21 0.12 <0.10 <0.10  LATICACIDVEN 1.1  --   --   --     Recent Results (from the past 240 hour(s))  Blood Culture (routine x 2)     Status: None (Preliminary result)   Collection Time: 10/21/19  4:52 PM   Specimen: BLOOD  Result Value Ref Range Status   Specimen Description BLOOD RIGHT ANTECUBITAL  Final   Special Requests   Final    BOTTLES DRAWN AEROBIC AND ANAEROBIC Blood Culture results may not be optimal due to an inadequate volume of blood received in culture bottles   Culture   Final    NO GROWTH 2 DAYS Performed at [facility] [address], [place], [place] [address]    Report Status PENDING  Incomplete  Blood Culture (routine x 2)     Status: None (Preliminary result)   Collection Time: 10/21/19  4:52 PM   Specimen: BLOOD RIGHT HAND  Result Value Ref Range Status   Specimen Description BLOOD RIGHT HAND  Final   Special Requests   Final    BOTTLES DRAWN AEROBIC AND ANAEROBIC Blood Culture results may not be optimal due to an inadequate volume of blood received in culture bottles   Culture   Final    NO GROWTH 2  DAYS Performed at [facility], [place] [address] [place], [place] [address]    Report Status PENDING  Incomplete  Respiratory Panel by PCR     Status: None   Collection Time: 10/23/19  7:09 AM   Specimen: Nasopharyngeal Swab; Respiratory  Result Value Ref Range Status   Adenovirus NOT DETECTED NOT DETECTED Final   Coronavirus 229E NOT DETECTED NOT DETECTED Final    Comment: (NOTE) The Coronavirus on the Respiratory Panel, DOES NOT test for the novel  Coronavirus (2019 nCoV)    Coronavirus HKU1 NOT DETECTED NOT DETECTED Final   Coronavirus NL63 NOT DETECTED NOT DETECTED Final   Coronavirus OC43 NOT DETECTED NOT DETECTED Final   Metapneumovirus NOT DETECTED NOT DETECTED Final   Rhinovirus / Enterovirus NOT DETECTED NOT DETECTED Final   Influenza A NOT DETECTED NOT DETECTED Final   Influenza B NOT DETECTED NOT DETECTED Final   Parainfluenza Virus 1 NOT DETECTED NOT DETECTED Final   Parainfluenza Virus 2 NOT DETECTED NOT DETECTED Final   Parainfluenza Virus 3 NOT DETECTED NOT DETECTED Final   Parainfluenza Virus 4 NOT DETECTED NOT DETECTED Final   Respiratory Syncytial Virus NOT DETECTED NOT DETECTED Final   Bordetella pertussis NOT DETECTED NOT DETECTED Final   Chlamydophila pneumoniae NOT DETECTED NOT DETECTED Final   Mycoplasma pneumoniae NOT DETECTED NOT DETECTED Final    Comment: Performed at [facility], [place]. [address], [place], [place] [address]         Radiology Studies: No results found.      Scheduled Meds: . AeroChamber Plus Flo-Vu Medium  1 each Other Once  . vitamin C  500 mg Oral Daily  . atorvastatin  10 mg Oral q1800  . clopidogrel  75 mg Oral Daily  . dexamethasone (DECADRON) injection  6 mg Intravenous Q24H  . enoxaparin (LOVENOX) injection  40 mg Subcutaneous Q24H  . fluticasone  2 spray Each Nare Daily  . folic acid  1 mg Oral Daily  . guaiFENesin  1,200 mg Oral BID  . insulin aspart  0-5 Units Subcutaneous QHS  . insulin aspart  0-6  Units Subcutaneous TID WC  . insulin glargine  30 Units Subcutaneous q morning - 10a  . ipratropium  2 puff Inhalation Q6H  . levalbuterol  2 puff Inhalation Q6H  . loratadine  10 mg Oral Daily  . mouth rinse  15 mL Mouth Rinse BID  . multivitamin with minerals  1 tablet Oral Daily  . sodium zirconium cyclosilicate  5 g Oral Once  . thiamine  100 mg Oral Daily  . zinc sulfate  220 mg Oral Daily   Continuous Infusions: . remdesivir 100 mg in NS 100 mL 100 mg (10/24/19 0952)     LOS: 3 days    Time spent: 40 minutes    Irine Seal, MD [facility]  If 7PM-7AM, please contact night-coverage [email or web address] 10/24/2019, 12:08 PM

## 2019-10-25 ENCOUNTER — Inpatient Hospital Stay (HOSPITAL_COMMUNITY): Payer: Medicare HMO

## 2019-10-25 LAB — COMPREHENSIVE METABOLIC PANEL
ALT: 22 U/L (ref 0–44)
AST: 23 U/L (ref 15–41)
Albumin: 3 g/dL — ABNORMAL LOW (ref 3.5–5.0)
Alkaline Phosphatase: 47 U/L (ref 38–126)
Anion gap: 13 (ref 5–15)
BUN: 42 mg/dL — ABNORMAL HIGH (ref 8–23)
CO2: 22 mmol/L (ref 22–32)
Calcium: 8.4 mg/dL — ABNORMAL LOW (ref 8.9–10.3)
Chloride: 101 mmol/L (ref 98–111)
Creatinine, Ser: 1.48 mg/dL — ABNORMAL HIGH (ref 0.61–1.24)
GFR calc Af Amer: 55 mL/min — ABNORMAL LOW (ref >60)
GFR calc non Af Amer: 48 mL/min — ABNORMAL LOW (ref >60)
Glucose, Bld: 183 mg/dL — ABNORMAL HIGH (ref 70–99)
Potassium: 4.8 mmol/L (ref 3.5–5.1)
Sodium: 136 mmol/L (ref 135–145)
Total Bilirubin: 0.6 mg/dL (ref 0.3–1.2)
Total Protein: 6.3 g/dL — ABNORMAL LOW (ref 6.5–8.1)

## 2019-10-25 LAB — CBC WITH DIFFERENTIAL/PLATELET
Abs Immature Granulocytes: 0.03 10*3/uL (ref 0.00–0.07)
Basophils Absolute: 0 10*3/uL (ref 0.0–0.1)
Basophils Relative: 0 %
Eosinophils Absolute: 0 10*3/uL (ref 0.0–0.5)
Eosinophils Relative: 0 %
HCT: 49.1 % (ref 39.0–52.0)
Hemoglobin: 16.3 g/dL (ref 13.0–17.0)
Immature Granulocytes: 1 %
Lymphocytes Relative: 9 %
Lymphs Abs: 0.5 10*3/uL — ABNORMAL LOW (ref 0.7–4.0)
MCH: 30.4 pg (ref 26.0–34.0)
MCHC: 33.2 g/dL (ref 30.0–36.0)
MCV: 91.4 fL (ref 80.0–100.0)
Monocytes Absolute: 0.3 10*3/uL (ref 0.1–1.0)
Monocytes Relative: 5 %
Neutro Abs: 5.2 10*3/uL (ref 1.7–7.7)
Neutrophils Relative %: 85 %
Platelets: 181 10*3/uL (ref 150–400)
RBC: 5.37 MIL/uL (ref 4.22–5.81)
RDW: 12.7 % (ref 11.5–15.5)
WBC: 6.1 10*3/uL (ref 4.0–10.5)
nRBC: 0 % (ref 0.0–0.2)

## 2019-10-25 LAB — GLUCOSE, CAPILLARY
Glucose-Capillary: 167 mg/dL — ABNORMAL HIGH (ref 70–99)
Glucose-Capillary: 200 mg/dL — ABNORMAL HIGH (ref 70–99)
Glucose-Capillary: 138 mg/dL — ABNORMAL HIGH (ref 70–99)
Glucose-Capillary: 165 mg/dL — ABNORMAL HIGH (ref 70–99)

## 2019-10-25 LAB — MAGNESIUM: Magnesium: 2.1 mg/dL (ref 1.7–2.4)

## 2019-10-25 LAB — C-REACTIVE PROTEIN: CRP: 0.7 mg/dL (ref <1.0)

## 2019-10-25 LAB — FERRITIN: Ferritin: 223 ng/mL (ref 24–336)

## 2019-10-25 LAB — PHOSPHORUS: Phosphorus: 3.8 mg/dL (ref 2.5–4.6)

## 2019-10-25 LAB — D-DIMER, QUANTITATIVE: D-Dimer, Quant: 0.34 ug/mL-FEU (ref 0.00–0.50)

## 2019-10-25 MED ORDER — FUROSEMIDE 40 MG PO TABS
40.0000 mg | ORAL_TABLET | Freq: Every day | ORAL | Status: DC
  Administered 2019-10-26: 40 mg via ORAL
  Filled 2019-10-25: qty 1

## 2019-10-25 MED ORDER — FUROSEMIDE 40 MG PO TABS: 40.0000 mg | ORAL_TABLET | Freq: Every day | ORAL | Status: DC

## 2019-10-25 MED ORDER — BICALUTAMIDE 50 MG PO TABS
50.0000 mg | ORAL_TABLET | Freq: Every day | ORAL | Status: DC
  Administered 2019-10-25 – 2019-10-26 (×2): 50 mg via ORAL
  Filled 2019-10-25 (×3): qty 1

## 2019-10-25 MED ORDER — FUROSEMIDE 10 MG/ML IJ SOLN
40.0000 mg | Freq: Once | INTRAMUSCULAR | Status: AC
  Administered 2019-10-25: 40 mg via INTRAVENOUS
  Filled 2019-10-25: qty 4

## 2019-10-25 NOTE — Progress Notes (Signed)
SATURATION QUALIFICATIONS: (This note is used to comply with regulatory documentation for home oxygen)  Patient Saturations on Room Air at Rest = 87%  Patient Saturations on Room Air while Ambulating = 82%  Patient Saturations on 2 Liters of oxygen while Ambulating = 88%  Please briefly explain why patient needs home oxygen: oxygen sats drop when ambulating on RA.

## 2019-10-25 NOTE — Progress Notes (Signed)
Physical Therapy Treatment Patient Details Name: Gregory [name] MRN: [id number] DOB: 08-31-1951 Today's Date: 10/25/2019    History of Present Illness 69 year old male, who works as a Art gallery manager and a Psychologist, counselling.  [name] past medical history includes coronary artery disease, MI, diabetes mellitus, CHF, hypertension, chronic kidney disease stage IIIa, back surgery. 10/21/19 tested + for COVID 19 at urgent care and transferred to ED due to hypoxemia. +COVID pna    PT Comments    Pt progressing well with mobility. He required supervision transfers and ambulation 300' with RW. Pt able to ambulate in room without AD supervision. Mobilized on 4L O2 with desat to 86%. SpO2 90% at rest on 3L at end of session.    Follow Up Recommendations  Home health PT;Supervision - Intermittent     Equipment Recommendations  Rolling walker with 5" wheels    Recommendations for Other Services       Precautions / Restrictions Precautions Precautions: Fall    Mobility  Bed Mobility Overal bed mobility: Modified Independent             General bed mobility comments: increased time and effort, +rail  Transfers Overall transfer level: Needs assistance Equipment used: Ambulation equipment used   Sit to Stand: Supervision;Modified independent (Device/Increase time)         General transfer comment: supervision for safety/lines  Ambulation/Gait Ambulation/Gait assistance: Supervision Gait Distance (Feet): 300 Feet Assistive device: Rolling walker (2 wheeled) Gait Pattern/deviations: Step-through pattern;Decreased stride length Gait velocity: WFL Gait velocity interpretation: >2.62 ft/sec, indicative of community ambulatory General Gait Details: Pt ambulated on 4L with SpO2 86%.Steady gait with RW. Supervision ambulation in room without AD.   Stairs             Wheelchair Mobility    Modified Rankin (Stroke Patients Only)       Balance Overall balance assessment:  Mild deficits observed, not formally tested                                          Cognition Arousal/Alertness: Awake/alert Behavior During Therapy: WFL for tasks assessed/performed Overall Cognitive Status: Within Functional Limits for tasks assessed                                        Exercises      General Comments General comments (skin integrity, edema, etc.): Pt on 3L at rest with SpO2 90%. Increased to 4L for ambulation with desat to 86%. Pt conversive and without c/o SOB. 29 RR and 77 HR.      Pertinent Vitals/Pain Pain Assessment: No/denies pain    Home Living                      Prior Function            PT Goals (current goals can now be found in the care plan section) Acute Rehab PT Goals Patient Stated Goal: home Progress towards PT goals: Progressing toward goals    Frequency    Min 3X/week      PT Plan Current plan remains appropriate    Co-evaluation              AM-PAC PT "6 Clicks" Mobility   Outcome Measure  Help needed turning from your  back to your side while in a flat bed without using bedrails?: None Help needed moving from lying on your back to sitting on the side of a flat bed without using bedrails?: None Help needed moving to and from a bed to a chair (including a wheelchair)?: None Help needed standing up from a chair using your arms (e.g., wheelchair or bedside chair)?: None Help needed to walk in hospital room?: A Little Help needed climbing 3-5 steps with a railing? : A Little 6 Click Score: 22    End of Session Equipment Utilized During Treatment: Oxygen;Gait belt Activity Tolerance: Patient tolerated treatment well Patient left: with call bell/phone within reach;in chair Nurse Communication: Mobility status PT Visit Diagnosis: Unsteadiness on feet (R26.81);Difficulty in walking, not elsewhere classified (R26.2)     Time: [id number] PT Time Calculation (min) (ACUTE  ONLY): 29 min  Charges:  $Gait Training: 23-37 mins                     Lorrin Goodell, [place]  Office # [phone] Pager [phone]    Lorriane Shire 10/25/2019, 1:38 PM

## 2019-10-25 NOTE — Progress Notes (Signed)
PROGRESS NOTE    Gregory [name]  [id number] DOB: 08/17/1951 DOA: 10/21/2019 PCP: Seward Carol, MD   Brief Narrative:  HPI per Dr. Marthenia Rolling Patient is a 69 year old male, who works as a Art gallery manager and a Psychologist, counselling.  [name] past medical history includes coronary artery disease, MI, diabetes mellitus, CHF, hypertension, hyperlipidemia, GERD and chronic kidney disease stage IIIa.  According to the patient, he was not feeling well about 4 days prior to presentation.  Patient is a poor historian.  Patient reported having hallucinations.  However, on further questioning patient explained that he was confused and that was what he referred as hallucinations.  Patient also had a general feeling of unwell.  Patient decided to test for COVID-19 after he learned that his coworker was positive for COVID-19.  Patient was tested at an urgent care was found to be positive.  Patient was advised to come to the hospital for further assessment and management.  Patient was also noted to be hypoxic with O2 sat of 86%.  Patient is currently on 2 to 3 L of supplemental oxygen.  Inflammatory markers are elevated.  Chest x-ray revealed patchy bibasilar airspace disease that is thought to be secondary to COVID-19 pneumonia.  No headache, no neck pain, no chest pain, no fever or chills and no urinary symptoms.  Hospitalist team has been asked to admit patient for further assessment and management of the COVID-19.  ED Course: On presentation to the hospital, temperature was 98.6, heart rate of 77, respiratory rate of 26, blood pressure of 128/76 mmHg and oxygen of 86%.  Pertinent labs reveal serum creatinine of 2.49 (up from 1.04), LDH of 215, ferritin of 226, CRP of 4.8, fibrinogen of 631, D-dimer of 0.66.  Patient was given the first dose of steroids in the emergency department.   Assessment & Plan:   Principal Problem:   Acute respiratory failure due to COVID-19 [facility]) Active Problems:   Hyperlipidemia  Coronary atherosclerosis of native coronary artery   Diabetes mellitus type 2, uncontrolled (HCC)   Chronic kidney disease, stage 3   Essential hypertension   Acute on chronic renal failure (HCC)   1 acute respiratory failure with hypoxia secondary to COVID-19 pneumonia Patient had presented with complaints of feeling unwell.  Patient was seen at urgent care and Covid test was positive.  Patient noted to be hypoxic with sats in the 80s on room air.  Patient noted to have elevated inflammatory markers.  Chest x-ray which was done with bilateral infiltrates.  Patient placed on O2.  Procalcitonin was 0.21.  Improving clinically.  Patient with sats of 90% on 2 L nasal cannula.  Continue IV Decadron to complete a 10-day course, continue IV remdesivir to complete a 5-day course.  Continue Claritin, Mucinex, Flonase, Atrovent and Xopenex MDIs.  We will give Lasix 40 mg IV x1 and resume home dose oral Lasix tomorrow.  Continue to follow inflammatory markers.  If patient is improving clinically may need to be discharged home on home O2 and steroid taper.  Continue supportive care.   2.  Acute renal failure on chronic kidney disease stage III Patient noted on admission to have a creatinine of 2.49.  Baseline creatinine anywhere from 1.6-2.  Likely secondary to prerenal azotemia in the setting of ARB and diuretics.  Diuretics on hold.  Amlodipine/valsartan on hold.  Renal function improved with hydration.  IV fluids have been saline locked.  Creatinine currently at 1.48.  Renal ultrasound negative for hydronephrosis, increased renal  cortical echogenicity bilaterally suggestive of chronic renal parenchymal disease, bilateral renal cysts, incidentally noted heterogeneous echotexture of the partially imaged liver findings nonspecific.  We will give Lasix 40 mg IV x1 today due to ongoing hypoxia.  Resume home dose Lasix tomorrow.  Follow.  3.  Diabetes mellitus type 2 Hemoglobin A1c 7.1.  CBG of 167 on lab work this  morning. Likely component of steroids.  Continue to hold oral hypoglycemic agents.  Continue current dose of Lantus at 30 units daily.  Sliding scale insulin.   4.  Gout Resumed home regimen of allopurinol.  5.  Hypertension Blood pressure was borderline on admission and improving.  Patient received Lasix 40 mg IV x1 today due to ongoing hypoxia.  Lopressor has been resumed at a decreased dose of 12.5 mg twice daily.  Resume home dose oral Lasix tomorrow.   6.  Hyperlipidemia  Continue statin.  7.  Coronary artery disease Stable.  Continue Plavix, statin.  Exforge and diuretics on hold secondary to problem #2.  We will give Lasix 40 mg IV x1 today and resume home dose oral Lasix tomorrow.  [name] Lopressor has been resumed at a lower dose at 12.5 mg twice daily due to borderline blood pressure.  Follow.   8.  Hyperkalemia Status post Lokelma.  Potassium this morning of 5.3.  Repeat potassium at 4.6.  Follow.   9.  Nonsustained V. tach Per RN patient with a 7 beat run of asymptomatic nonsustained V. tach on 10/24/2019.  Potassium at 4.8.  Magnesium at 2.1.  Continue current dose of Lopressor 12.5 mg twice daily as patient had presented with borderline blood pressure and could uptitrate as needed.  Monitor electrolytes with diuretics.  Follow.   DVT prophylaxis: Lovenox Code Status: Full Family Communication: Updated patient.  No family at bedside. Disposition Plan: Home when clinically improved, hypoxia improved with completion of IV remdesivir hopefully tomorrow..   Consultants:   None  Procedures:   Chest x-ray 10/21/2019  Renal ultrasound 10/22/2019  Antimicrobials:  None  COVID-19 Labs  Recent Labs    10/23/19 0650 10/24/19 0346 10/25/19 0500  DDIMER 0.45 0.39 0.34  FERRITIN 232 211 223  CRP 2.0* 1.0* 0.7    No results found for: SARSCOV2NAA   Subjective: Patient sitting up in chair.  Denies any significant shortness of breath.  Denies any chest pain.   Feeling better.  Patient noted to be hypoxic with sats of 90% on 2 L.  Patient states not on home O2.     Objective: Vitals:   10/25/19 0300 10/25/19 0400 10/25/19 0500 10/25/19 0800  BP: 130/74 132/77 131/72   Pulse: 69 62 65   Resp: (!) 23 (!) 21 17   Temp:   97.6 F (36.4 C)   TempSrc:   Oral   SpO2:   90% 90%  Weight:      Height:        Intake/Output Summary (Last 24 hours) at 10/25/2019 1219 Last data filed at 10/25/2019 [id number] Gross per 24 hour  Intake 720 ml  Output 550 ml  Net 170 ml   Filed Weights   10/22/19 1600 10/24/19 0500  Weight: 102.7 kg 101.1 kg    Examination:  General exam: NAD.  Respiratory system: Decreased breath sounds in the bases.  No wheezing.  Speaking in full sentences.  Normal respiratory effort.   Cardiovascular system: Regular rate rhythm no murmurs rubs or gallops.  No JVD.  No lower extremity edema.   Gastrointestinal  system: Abdomen is soft, mildly distended, nontender to palpation, positive bowel sounds.  No rebound.  No guarding.  Central nervous system: Alert and oriented. No focal neurological deficits. Extremities: Symmetric 5 x 5 power. Skin: No rashes, lesions or ulcers Psychiatry: Judgement and insight appear normal. Mood & affect appropriate.     Data Reviewed: I have personally reviewed following labs and imaging studies  CBC: Recent Labs  Lab 10/21/19 1651 10/22/19 0622 10/23/19 0650 10/24/19 0346 10/25/19 0500  WBC 5.0 2.7* 5.2 6.7 6.1  NEUTROABS 3.7 2.1 4.4 5.8 5.2  HGB 16.9 16.2 15.3 15.7 16.3  HCT 51.3 48.8 46.8 47.0 49.1  MCV 94.0 92.8 91.1 90.7 91.4  PLT 148* 159 153 158 0000000   Basic Metabolic Panel: Recent Labs  Lab 10/21/19 1651 10/22/19 0622 10/23/19 0650 10/24/19 0346 10/24/19 1525 10/25/19 0500  NA 138 135 136 139  --  136  K 4.8 5.3* 5.2* 5.3* 4.6 4.8  CL 101 102 105 107  --  101  CO2 25 20* 22 24  --  22  GLUCOSE 123* 244* 227* 140*  --  183*  BUN 42* 49* 49* 45*  --  42*  CREATININE 2.49*  2.18* 1.72* 1.54*  --  1.48*  CALCIUM 8.4* 8.2* 8.0* 8.2*  --  8.4*  MG  --  2.3 2.3 2.3  --  2.1  PHOS  --  5.7* 4.3 3.3  --  3.8   GFR: Estimated Creatinine Clearance: 53.4 mL/min (A) (by C-G formula based on SCr of 1.48 mg/dL (H)). Liver Function Tests: Recent Labs  Lab 10/21/19 1651 10/22/19 0622 10/23/19 0650 10/24/19 0346 10/25/19 0500  AST 24 21 19 20 23   ALT 18 19 17 18 22   ALKPHOS 52 51 43 40 47  BILITOT 0.7 0.5 0.5 0.7 0.6  PROT 6.8 6.6 5.9* 5.8* 6.3*  ALBUMIN 3.6 3.1* 2.9* 3.0* 3.0*   No results for input(s): LIPASE, AMYLASE in the last 168 hours. No results for input(s): AMMONIA in the last 168 hours. Coagulation Profile: No results for input(s): INR, PROTIME in the last 168 hours. Cardiac Enzymes: No results for input(s): CKTOTAL, CKMB, CKMBINDEX, TROPONINI in the last 168 hours. BNP (last 3 results) No results for input(s): PROBNP in the last 8760 hours. HbA1C: No results for input(s): HGBA1C in the last 72 hours. CBG: Recent Labs  Lab 10/24/19 1146 10/24/19 1617 10/24/19 2120 10/25/19 0746 10/25/19 1153  GLUCAP 181* 156* 170* 167* 200*   Lipid Profile: No results for input(s): CHOL, HDL, LDLCALC, TRIG, CHOLHDL, LDLDIRECT in the last 72 hours. Thyroid Function Tests: No results for input(s): TSH, T4TOTAL, FREET4, T3FREE, THYROIDAB in the last 72 hours. Anemia Panel: Recent Labs    10/24/19 0346 10/25/19 0500  FERRITIN 211 223   Sepsis Labs: Recent Labs  Lab 10/21/19 1651 10/22/19 1830 10/23/19 0650 10/24/19 0346  PROCALCITON 0.21 0.12 <0.10 <0.10  LATICACIDVEN 1.1  --   --   --     Recent Results (from the past 240 hour(s))  Blood Culture (routine x 2)     Status: None (Preliminary result)   Collection Time: 10/21/19  4:52 PM   Specimen: BLOOD  Result Value Ref Range Status   Specimen Description BLOOD RIGHT ANTECUBITAL  Final   Special Requests   Final    BOTTLES DRAWN AEROBIC AND ANAEROBIC Blood Culture results may not be optimal  due to an inadequate volume of blood received in culture bottles   Culture   Final  NO GROWTH 4 DAYS Performed at [facility], [place] [address] [place], [place] [address]    Report Status PENDING  Incomplete  Blood Culture (routine x 2)     Status: None (Preliminary result)   Collection Time: 10/21/19  4:52 PM   Specimen: BLOOD RIGHT HAND  Result Value Ref Range Status   Specimen Description BLOOD RIGHT HAND  Final   Special Requests   Final    BOTTLES DRAWN AEROBIC AND ANAEROBIC Blood Culture results may not be optimal due to an inadequate volume of blood received in culture bottles   Culture   Final    NO GROWTH 4 DAYS Performed at [facility], [place] [address], [place], [place] [address]    Report Status PENDING  Incomplete  Respiratory Panel by PCR     Status: None   Collection Time: 10/23/19  7:09 AM   Specimen: Nasopharyngeal Swab; Respiratory  Result Value Ref Range Status   Adenovirus NOT DETECTED NOT DETECTED Final   Coronavirus 229E NOT DETECTED NOT DETECTED Final    Comment: (NOTE) The Coronavirus on the Respiratory Panel, DOES NOT test for the novel  Coronavirus (2019 nCoV)    Coronavirus HKU1 NOT DETECTED NOT DETECTED Final   Coronavirus NL63 NOT DETECTED NOT DETECTED Final   Coronavirus OC43 NOT DETECTED NOT DETECTED Final   Metapneumovirus NOT DETECTED NOT DETECTED Final   Rhinovirus / Enterovirus NOT DETECTED NOT DETECTED Final   Influenza A NOT DETECTED NOT DETECTED Final   Influenza B NOT DETECTED NOT DETECTED Final   Parainfluenza Virus 1 NOT DETECTED NOT DETECTED Final   Parainfluenza Virus 2 NOT DETECTED NOT DETECTED Final   Parainfluenza Virus 3 NOT DETECTED NOT DETECTED Final   Parainfluenza Virus 4 NOT DETECTED NOT DETECTED Final   Respiratory Syncytial Virus NOT DETECTED NOT DETECTED Final   Bordetella pertussis NOT DETECTED NOT DETECTED Final   Chlamydophila pneumoniae NOT DETECTED NOT DETECTED Final   Mycoplasma pneumoniae NOT DETECTED  NOT DETECTED Final    Comment: Performed at [facility], [place]. [address] [place], [place] [address]         Radiology Studies: No results found.      Scheduled Meds: . AeroChamber Plus Flo-Vu Medium  1 each Other Once  . vitamin C  500 mg Oral Daily  . atorvastatin  10 mg Oral q1800  . bicalutamide  50 mg Oral Daily  . clopidogrel  75 mg Oral Daily  . dexamethasone (DECADRON) injection  6 mg Intravenous Q24H  . enoxaparin (LOVENOX) injection  40 mg Subcutaneous Q24H  . fluticasone  2 spray Each Nare Daily  . folic acid  1 mg Oral Daily  . furosemide  40 mg Intravenous Once  . [START ON 10/26/2019] furosemide  40 mg Oral Daily  . guaiFENesin  1,200 mg Oral BID  . insulin aspart  0-5 Units Subcutaneous QHS  . insulin aspart  0-6 Units Subcutaneous TID WC  . insulin glargine  30 Units Subcutaneous q morning - 10a  . ipratropium  2 puff Inhalation Q6H  . levalbuterol  2 puff Inhalation Q6H  . loratadine  10 mg Oral Daily  . mouth rinse  15 mL Mouth Rinse BID  . metoprolol tartrate  12.5 mg Oral BID  . multivitamin with minerals  1 tablet Oral Daily  . thiamine  100 mg Oral Daily  . zinc sulfate  220 mg Oral Daily   Continuous Infusions:    LOS:  4 days    Time spent: 40 minutes    Irine Seal, MD [facility]  If 7PM-7AM, please contact night-coverage [email or web address] 10/25/2019, 12:19 PM

## 2019-10-26 DIAGNOSIS — J9600 Acute respiratory failure, unspecified whether with hypoxia or hypercapnia: Secondary | ICD-10-CM

## 2019-10-26 LAB — GLUCOSE, CAPILLARY
Glucose-Capillary: 174 mg/dL — ABNORMAL HIGH (ref 70–99)
Glucose-Capillary: 186 mg/dL — ABNORMAL HIGH (ref 70–99)

## 2019-10-26 LAB — CULTURE, BLOOD (ROUTINE X 2)
Culture: NO GROWTH
Culture: NO GROWTH

## 2019-10-26 LAB — CBC WITH DIFFERENTIAL/PLATELET
Abs Immature Granulocytes: 0.07 10*3/uL (ref 0.00–0.07)
Basophils Absolute: 0 10*3/uL (ref 0.0–0.1)
Basophils Relative: 0 %
Eosinophils Absolute: 0 10*3/uL (ref 0.0–0.5)
Eosinophils Relative: 0 %
HCT: 51.2 % (ref 39.0–52.0)
Hemoglobin: 17.1 g/dL — ABNORMAL HIGH (ref 13.0–17.0)
Immature Granulocytes: 1 %
Lymphocytes Relative: 9 %
Lymphs Abs: 0.7 10*3/uL (ref 0.7–4.0)
MCH: 30.2 pg (ref 26.0–34.0)
MCHC: 33.4 g/dL (ref 30.0–36.0)
MCV: 90.3 fL (ref 80.0–100.0)
Monocytes Absolute: 0.5 10*3/uL (ref 0.1–1.0)
Monocytes Relative: 5 %
Neutro Abs: 7.2 10*3/uL (ref 1.7–7.7)
Neutrophils Relative %: 85 %
Platelets: 207 10*3/uL (ref 150–400)
RBC: 5.67 MIL/uL (ref 4.22–5.81)
RDW: 12.8 % (ref 11.5–15.5)
WBC: 8.5 10*3/uL (ref 4.0–10.5)
nRBC: 0 % (ref 0.0–0.2)

## 2019-10-26 LAB — MAGNESIUM: Magnesium: 2.2 mg/dL (ref 1.7–2.4)

## 2019-10-26 LAB — COMPREHENSIVE METABOLIC PANEL
ALT: 35 U/L (ref 0–44)
AST: 27 U/L (ref 15–41)
Albumin: 3.3 g/dL — ABNORMAL LOW (ref 3.5–5.0)
Alkaline Phosphatase: 53 U/L (ref 38–126)
Anion gap: 12 (ref 5–15)
BUN: 44 mg/dL — ABNORMAL HIGH (ref 8–23)
CO2: 23 mmol/L (ref 22–32)
Calcium: 8.5 mg/dL — ABNORMAL LOW (ref 8.9–10.3)
Chloride: 100 mmol/L (ref 98–111)
Creatinine, Ser: 1.64 mg/dL — ABNORMAL HIGH (ref 0.61–1.24)
GFR calc Af Amer: 49 mL/min — ABNORMAL LOW (ref >60)
GFR calc non Af Amer: 42 mL/min — ABNORMAL LOW (ref >60)
Glucose, Bld: 241 mg/dL — ABNORMAL HIGH (ref 70–99)
Potassium: 5.1 mmol/L (ref 3.5–5.1)
Sodium: 135 mmol/L (ref 135–145)
Total Bilirubin: 0.7 mg/dL (ref 0.3–1.2)
Total Protein: 6.7 g/dL (ref 6.5–8.1)

## 2019-10-26 LAB — FERRITIN: Ferritin: 277 ng/mL (ref 24–336)

## 2019-10-26 LAB — PHOSPHORUS: Phosphorus: 3.7 mg/dL (ref 2.5–4.6)

## 2019-10-26 LAB — C-REACTIVE PROTEIN: CRP: 0.6 mg/dL (ref <1.0)

## 2019-10-26 LAB — D-DIMER, QUANTITATIVE: D-Dimer, Quant: 0.31 ug/mL-FEU (ref 0.00–0.50)

## 2019-10-26 MED ORDER — METOPROLOL TARTRATE 25 MG PO TABS: 12.5000 mg | ORAL_TABLET | Freq: Two times a day (BID) | ORAL | 2 refills | Status: DC

## 2019-10-26 MED ORDER — ADULT MULTIVITAMIN W/MINERALS CH: 1.0000 | ORAL_TABLET | Freq: Every day | ORAL | Status: DC

## 2019-10-26 MED ORDER — FLUTICASONE PROPIONATE 50 MCG/ACT NA SUSP: 2.0000 | Freq: Every day | NASAL | 2 refills | Status: DC

## 2019-10-26 MED ORDER — ASCORBIC ACID 500 MG PO TABS: 500.0000 mg | ORAL_TABLET | Freq: Every day | ORAL | Status: DC

## 2019-10-26 MED ORDER — FOLIC ACID 1 MG PO TABS: 1.0000 mg | ORAL_TABLET | Freq: Every day | ORAL | Status: DC

## 2019-10-26 MED ORDER — INSULIN GLARGINE 100 UNIT/ML ~~LOC~~ SOLN: 30.0000 [IU] | Freq: Every morning | SUBCUTANEOUS | 11 refills | Status: DC

## 2019-10-26 MED ORDER — IPRATROPIUM-ALBUTEROL 20-100 MCG/ACT IN AERS: 1.0000 | INHALATION_SPRAY | Freq: Four times a day (QID) | RESPIRATORY_TRACT | Status: DC | PRN

## 2019-10-26 MED ORDER — IPRATROPIUM BROMIDE HFA 17 MCG/ACT IN AERS: 2.0000 | INHALATION_SPRAY | Freq: Four times a day (QID) | RESPIRATORY_TRACT | 12 refills | Status: DC

## 2019-10-26 MED ORDER — THIAMINE HCL 100 MG PO TABS: 100.0000 mg | ORAL_TABLET | Freq: Every day | ORAL | Status: DC

## 2019-10-26 MED ORDER — LEVALBUTEROL TARTRATE 45 MCG/ACT IN AERO: 2.0000 | INHALATION_SPRAY | Freq: Four times a day (QID) | RESPIRATORY_TRACT | 12 refills | Status: DC

## 2019-10-26 MED ORDER — GUAIFENESIN ER 600 MG PO TB12: 1200.0000 mg | ORAL_TABLET | Freq: Two times a day (BID) | ORAL | Status: DC

## 2019-10-26 MED ORDER — DEXAMETHASONE 6 MG PO TABS: 6.0000 mg | ORAL_TABLET | Freq: Two times a day (BID) | ORAL | 0 refills | Status: DC

## 2019-10-26 MED ORDER — ZINC SULFATE 220 (50 ZN) MG PO CAPS: 220.0000 mg | ORAL_CAPSULE | Freq: Every day | ORAL | Status: DC

## 2019-10-26 MED ORDER — LORATADINE 10 MG PO TABS: 10.0000 mg | ORAL_TABLET | Freq: Every day | ORAL | Status: DC

## 2019-10-26 NOTE — Discharge Instructions (Signed)
COVID-19 COVID-19 is a respiratory infection that is caused by a virus called severe acute respiratory syndrome coronavirus 2 (SARS-CoV-2). The disease is also known as coronavirus disease or novel coronavirus. In some people, the virus may not cause any symptoms. In others, it may cause a serious infection. The infection can get worse quickly and can lead to complications, such as:  Pneumonia, or infection of the lungs.  Acute respiratory distress syndrome or ARDS. This is a condition in which fluid build-up in the lungs prevents the lungs from filling with air and passing oxygen into the blood.  Acute respiratory failure. This is a condition in which there is not enough oxygen passing from the lungs to the body or when carbon dioxide is not passing from the lungs out of the body.  Sepsis or septic shock. This is a serious bodily reaction to an infection.  Blood clotting problems.  Secondary infections due to bacteria or fungus.  Organ failure. This is when your body's organs stop working. The virus that causes COVID-19 is contagious. This means that it can spread from person to person through droplets from coughs and sneezes (respiratory secretions). What are the causes? This illness is caused by a virus. You may catch the virus by:  Breathing in droplets from an infected person. Droplets can be spread by a person breathing, speaking, singing, coughing, or sneezing.  Touching something, like a table or a doorknob, that was exposed to the virus (contaminated) and then touching your mouth, nose, or eyes. What increases the risk? Risk for infection You are more likely to be infected with this virus if you:  Are within 6 feet (2 meters) of a person with COVID-19.  Provide care for or live with a person who is infected with COVID-19.  Spend time in crowded indoor spaces or live in shared housing. Risk for serious illness You are more likely to become seriously ill from the virus if you:   Are 50 years of age or older. The higher your age, the more you are at risk for serious illness.  Live in a nursing home or long-term care facility.  Have cancer.  Have a long-term (chronic) disease such as: ? Chronic lung disease, including chronic obstructive pulmonary disease or asthma. ? A long-term disease that lowers your body's ability to fight infection (immunocompromised). ? Heart disease, including heart failure, a condition in which the arteries that lead to the heart become narrow or blocked (coronary artery disease), a disease which makes the heart muscle thick, weak, or stiff (cardiomyopathy). ? Diabetes. ? Chronic kidney disease. ? Sickle cell disease, a condition in which red blood cells have an abnormal "sickle" shape. ? Liver disease.  Are obese. What are the signs or symptoms? Symptoms of this condition can range from mild to severe. Symptoms may appear any time from 2 to 14 days after being exposed to the virus. They include:  A fever or chills.  A cough.  Difficulty breathing.  Headaches, body aches, or muscle aches.  Runny or stuffy (congested) nose.  A sore throat.  New loss of taste or smell. Some people may also have stomach problems, such as nausea, vomiting, or diarrhea. Other people may not have any symptoms of COVID-19. How is this diagnosed? This condition may be diagnosed based on:  Your signs and symptoms, especially if: ? You live in an area with a COVID-19 outbreak. ? You recently traveled to or from an area where the virus is common. ? You   provide care for or live with a person who was diagnosed with COVID-19. ? You were exposed to a person who was diagnosed with COVID-19.  A physical exam.  Lab tests, which may include: ? Taking a sample of fluid from the back of your nose and throat (nasopharyngeal fluid), your nose, or your throat using a swab. ? A sample of mucus from your lungs (sputum). ? Blood tests.  Imaging tests, which  may include, X-rays, CT scan, or ultrasound. How is this treated? At present, there is no medicine to treat COVID-19. Medicines that treat other diseases are being used on a trial basis to see if they are effective against COVID-19. Your health care provider will talk with you about ways to treat your symptoms. For most people, the infection is mild and can be managed at home with rest, fluids, and over-the-counter medicines. Treatment for a serious infection usually takes places in a hospital intensive care unit (ICU). It may include one or more of the following treatments. These treatments are given until your symptoms improve.  Receiving fluids and medicines through an IV.  Supplemental oxygen. Extra oxygen is given through a tube in the nose, a face mask, or a hood.  Positioning you to lie on your stomach (prone position). This makes it easier for oxygen to get into the lungs.  Continuous positive airway pressure (CPAP) or bi-level positive airway pressure (BPAP) machine. This treatment uses mild air pressure to keep the airways open. A tube that is connected to a motor delivers oxygen to the body.  Ventilator. This treatment moves air into and out of the lungs by using a tube that is placed in your windpipe.  Tracheostomy. This is a procedure to create a hole in the neck so that a breathing tube can be inserted.  Extracorporeal membrane oxygenation (ECMO). This procedure gives the lungs a chance to recover by taking over the functions of the heart and lungs. It supplies oxygen to the body and removes carbon dioxide. Follow these instructions at home: Lifestyle  If you are sick, stay home except to get medical care. Your health care provider will tell you how long to stay home. Call your health care provider before you go for medical care.  Rest at home as told by your health care provider.  Do not use any products that contain nicotine or tobacco, such as cigarettes, e-cigarettes, and  chewing tobacco. If you need help quitting, ask your health care provider.  Return to your normal activities as told by your health care provider. Ask your health care provider what activities are safe for you. General instructions  Take over-the-counter and prescription medicines only as told by your health care provider.  Drink enough fluid to keep your urine pale yellow.  Keep all follow-up visits as told by your health care provider. This is important. How is this prevented?  There is no vaccine to help prevent COVID-19 infection. However, there are steps you can take to protect yourself and others from this virus. To protect yourself:   Do not travel to areas where COVID-19 is a risk. The areas where COVID-19 is reported change often. To identify high-risk areas and travel restrictions, check the CDC travel website: wwwnc.cdc.gov/travel/notices  If you live in, or must travel to, an area where COVID-19 is a risk, take precautions to avoid infection. ? Stay away from people who are sick. ? Wash your hands often with soap and water for 20 seconds. If soap and water   are not available, use an alcohol-based hand sanitizer. ? Avoid touching your mouth, face, eyes, or nose. ? Avoid going out in public, follow guidance from your state and local health authorities. ? If you must go out in public, wear a cloth face covering or face mask. Make sure your mask covers your nose and mouth. ? Avoid crowded indoor spaces. Stay at least 6 feet (2 meters) away from others. ? Disinfect objects and surfaces that are frequently touched every day. This may include:  Counters and tables.  Doorknobs and light switches.  Sinks and faucets.  Electronics, such as phones, remote controls, keyboards, computers, and tablets. To protect others: If you have symptoms of COVID-19, take steps to prevent the virus from spreading to others.  If you think you have a COVID-19 infection, contact your health care  provider right away. Tell your health care team that you think you may have a COVID-19 infection.  Stay home. Leave your house only to seek medical care. Do not use public transport.  Do not travel while you are sick.  Wash your hands often with soap and water for 20 seconds. If soap and water are not available, use alcohol-based hand sanitizer.  Stay away from other members of your household. Let healthy household members care for children and pets, if possible. If you have to care for children or pets, wash your hands often and wear a mask. If possible, stay in your own room, separate from others. Use a different bathroom.  Make sure that all people in your household wash their hands well and often.  Cough or sneeze into a tissue or your sleeve or elbow. Do not cough or sneeze into your hand or into the air.  Wear a cloth face covering or face mask. Make sure your mask covers your nose and mouth. Where to find more information  Centers for Disease Control and Prevention: www.cdc.gov/coronavirus/2019-ncov/index.html  World Health Organization: www.who.int/health-topics/coronavirus Contact a health care provider if:  You live in or have traveled to an area where COVID-19 is a risk and you have symptoms of the infection.  You have had contact with someone who has COVID-19 and you have symptoms of the infection. Get help right away if:  You have trouble breathing.  You have pain or pressure in your chest.  You have confusion.  You have bluish lips and fingernails.  You have difficulty waking from sleep.  You have symptoms that get worse. These symptoms may represent a serious problem that is an emergency. Do not wait to see if the symptoms will go away. Get medical help right away. Call your local emergency services (911 in the [place] Do not drive yourself to the hospital. Let the emergency medical personnel know if you think you have COVID-19. Summary  COVID-19 is a  respiratory infection that is caused by a virus. It is also known as coronavirus disease or novel coronavirus. It can cause serious infections, such as pneumonia, acute respiratory distress syndrome, acute respiratory failure, or sepsis.  The virus that causes COVID-19 is contagious. This means that it can spread from person to person through droplets from breathing, speaking, singing, coughing, or sneezing.  You are more likely to develop a serious illness if you are 50 years of age or older, have a weak immune system, live in a nursing home, or have chronic disease.  There is no medicine to treat COVID-19. Your health care provider will talk with you about ways to treat your symptoms.    Take steps to protect yourself and others from infection. Wash your hands often and disinfect objects and surfaces that are frequently touched every day. Stay away from people who are sick and wear a mask if you are sick. This information is not intended to replace advice given to you by your health care provider. Make sure you discuss any questions you have with your health care provider. Document Revised: 08/08/2019 Document Reviewed: 11/14/2018 [employer] Patient Education  2020 [employer]  

## 2019-10-26 NOTE — Discharge Summary (Signed)
Physician Discharge Summary  Gregory [name] [id number] DOB: 1951-09-22 DOA: 10/21/2019  PCP: Seward Carol, MD  Admit date: 10/21/2019 Discharge date: 10/26/2019  Admitted From: home Disposition: home Recommendations for Outpatient Follow-up:  1. Follow up with PCP in 1-2 weeks 2. Please obtain BMP/CBC in one week   Home Health:pt Equipment/Devices:rolling walker   Discharge Condition:stable CODE STATUS:full Diet recommendation:cardiac Brief/Interim Summary:HPI per Dr. Marthenia Rolling Patient is a 69 year old male, who works as a Art gallery manager and a Psychologist, counselling. [name] past medical history includes coronary artery disease, MI, diabetes mellitus, CHF, hypertension, hyperlipidemia, GERD and chronic kidney disease stage IIIa. According to the patient, he was not feeling well about 4 days prior to presentation. Patient is a poor historian. Patient reported having hallucinations. However, on further questioning patient explained that he was confused and that was what he referred as hallucinations. Patient also had a general feeling of unwell. Patient decided to test for COVID-19 after he learned that his coworker was positive for COVID-19. Patient was tested at an urgent care was found to be positive. Patient was advised to come to the hospital for further assessment and management. Patient was also noted to be hypoxic with O2 sat of 86%. Patient is currently on 2 to 3 L of supplemental oxygen. Inflammatory markers are elevated. Chest x-ray revealed patchy bibasilar airspace disease that is thought to be secondary to COVID-19 pneumonia. No headache, no neck pain, no chest pain, no fever or chills and no urinary symptoms. Hospitalist team has been asked to admit patient for further assessment and management of the COVID-19.  ED Course:On presentation to the hospital, temperature was 98.6, heart rate of 77, respiratory rate of 26, blood pressure of 128/76 mmHg and oxygen of 86%.  Pertinent labs reveal serum creatinine of 2.49 (up from 1.04), LDH of 215, ferritin of 226, CRP of 4.8, fibrinogen of 631, D-dimer of 0.66. Patient was given the first dose of steroids in the emergency department.   Discharge Diagnoses:  Principal Problem:   Acute respiratory failure due to COVID-19 [facility]) Active Problems:   Hyperlipidemia   Coronary atherosclerosis of native coronary artery   Diabetes mellitus type 2, uncontrolled (HCC)   Chronic kidney disease, stage 3   Essential hypertension   Acute on chronic renal failure (HCC)   1 acute respiratory failure with hypoxia secondary to COVID-19 pneumonia Patient had presented with complaints of feeling unwell.  Patient was seen at urgent care and Covid test was positive.  Patient noted to be hypoxic with sats in the 80s on room air.  Patient noted to have elevated inflammatory markers.  Chest x-ray which was done with bilateral infiltrates.  Patient wast placed on O2.  Procalcitonin was 0.21.  Was treated with IV Decadron, remdesivir Mucinex, Flonase, Atrovent and Xopenex MDI. Improving clinically.  Patient with sats of 90% on 2 L nasal cannula.  He will be discharged home on oxygen and Decadron 6 mg twice a day to finish a course of 10 days.  2.  Acute renal failure on chronic kidney disease stage III Patient noted on admission to have a creatinine of 2.49.  Baseline creatinine anywhere from 1.6-2.  Likely secondary to prerenal azotemia in the setting of ARB and diuretics.    Renal function improved with hydration.  ARB and diuretics were on hold.  Renal ultrasound negative for hydronephrosis, increased renal cortical echogenicity bilaterally suggestive of chronic renal parenchymal disease, bilateral renal cysts, incidentally noted heterogeneous echotexture of the partially imaged liver findings nonspecific.  Creatinine 1.48 on discharge.  3.  Diabetes mellitus type 2 continue Lantus Januvia and Metformin. Hemoglobin A1c 7.1.    4.   Gout Resumed home regimen of allopurinol.  5.  Hypertension-continue Lasix ARB and Norvasc.  6.  Hyperlipidemia  Continue statin.  7.  Coronary artery disease Stable.  Continue Plavix, statin, Lasix, ARB, Norvasc.   8.  Hyperkalemia-potassium on the day of discharge was 4.8 status post Lokelma.  9.  Nonsustained V. tach Per RN patient with a 7 beat run of asymptomatic nonsustained V. tach on 10/24/2019.  Potassium at 4.8.  Magnesium at 2.1.  Lopressor continued.    Estimated body mass index is 33.66 kg/m as calculated from the following:   Height as of this encounter: 5\' 7"  (1.702 m).   Weight as of this encounter: 97.5 kg.  Discharge Instructions  Discharge Instructions    Diet - low sodium heart healthy   Complete by: As directed    Increase activity slowly   Complete by: As directed    MyChart COVID-19 home monitoring program   Complete by: Oct 26, 2019    Is the patient willing to use the [place] for home monitoring?: Yes   Temperature monitoring   Complete by: Oct 26, 2019    After how many days would you like to receive a notification of this patient's flowsheet entries?: 1     Allergies as of 10/26/2019   No Known Allergies     Medication List    TAKE these medications   allopurinol 300 MG tablet Commonly known as: ZYLOPRIM Take 300 mg by mouth daily as needed (gout).   amLODipine-valsartan 10-320 MG tablet Commonly known as: EXFORGE Take 1 tablet by mouth daily.   ascorbic acid 500 MG tablet Commonly known as: VITAMIN C Take 1 tablet (500 mg total) by mouth daily.   atorvastatin 10 MG tablet Commonly known as: LIPITOR Take 10 mg by mouth daily at 6 PM.   bicalutamide 50 MG tablet Commonly known as: CASODEX Take 50 mg by mouth daily.   clopidogrel 75 MG tablet Commonly known as: PLAVIX TAKE 1 TABLET (75 MG TOTAL) BY MOUTH DAILY. What changed: See the new instructions.   dexamethasone 6 MG tablet Commonly known as: DECADRON Take 1  tablet (6 mg total) by mouth 2 (two) times daily.   fluticasone 50 MCG/ACT nasal spray Commonly known as: FLONASE Place 2 sprays into both nostrils daily.   folic acid 1 MG tablet Commonly known as: FOLVITE Take 1 tablet (1 mg total) by mouth daily.   furosemide 40 MG tablet Commonly known as: LASIX TAKE 1 TABLET (40 MG TOTAL) BY MOUTH DAILY. Please keep upcoming appt in January before anymore refills. Thank you What changed:   how much to take  how to take this  when to take this  additional instructions   guaiFENesin 600 MG 12 hr tablet Commonly known as: MUCINEX Take 2 tablets (1,200 mg total) by mouth 2 (two) times daily.   insulin glargine 100 UNIT/ML injection Commonly known as: LANTUS Inject 0.3 mLs (30 Units total) into the skin every morning. What changed: how much to take   ipratropium 17 MCG/ACT inhaler Commonly known as: ATROVENT HFA Inhale 2 puffs into the lungs every 6 (six) hours.   Ipratropium-Albuterol 20-100 MCG/ACT Aers respimat Commonly known as: COMBIVENT Inhale 1 puff into the lungs every 6 (six) hours as needed for wheezing or shortness of breath.   Januvia 100 MG tablet Generic  drug: sitaGLIPtin Take 100 mg by mouth daily.   levalbuterol 45 MCG/ACT inhaler Commonly known as: XOPENEX HFA Inhale 2 puffs into the lungs every 6 (six) hours.   loratadine 10 MG tablet Commonly known as: CLARITIN Take 1 tablet (10 mg total) by mouth daily.   metFORMIN 1000 MG tablet Commonly known as: GLUCOPHAGE Take 1,000 mg by mouth 2 (two) times daily with a meal.   metoprolol tartrate 25 MG tablet Commonly known as: LOPRESSOR Take 0.5 tablets (12.5 mg total) by mouth 2 (two) times daily. What changed:   medication strength  how much to take   multivitamin with minerals Tabs tablet Take 1 tablet by mouth daily.   nitroGLYCERIN 0.4 MG SL tablet Commonly known as: NITROSTAT Place 1 tablet (0.4 mg total) under the tongue every 5 (five) minutes as  needed for chest pain.   thiamine 100 MG tablet Take 1 tablet (100 mg total) by mouth daily.   zinc sulfate 220 (50 Zn) MG capsule Take 1 capsule (220 mg total) by mouth daily.            Durable Medical Equipment  (From admission, onward)         Start     Ordered   10/26/19 1017  DME Oxygen  Once    Question Answer Comment  Length of Need 6 Months   Liters per Minute 2   Oxygen delivery system Gas      10/26/19 1017   10/26/19 1016  DME Walker  Once    Question Answer Comment  Walker: With 5 Inch Wheels   Patient needs a walker to treat with the following condition Unsteady gait when walking      10/26/19 1017   10/25/19 0827  For home use only DME Walker rolling  Once    Question:  Patient needs a walker to treat with the following condition  Answer:  Debility   10/25/19 0826         Follow-up Information    [facility] Follow up.   Specialty: Home Health Services Why: For home health services. They will call youto schedule a time for your first in home appointment Contact information: [place] [address] [phone]        Seward Carol, MD Follow up.   Specialty: Internal Medicine Contact information: [address] [place] [address] [phone]        Jettie Booze, MD .   Specialties: Cardiology, Radiology, Interventional Cardiology Contact information: [id number] [place] [address] [phone]          No Known Allergies  Consultations: None  Procedures/Studies: DG Chest 2 View  Result Date: 10/21/2019 CLINICAL DATA:  Short of breath.  COVID-19 positive EXAM: CHEST - 2 VIEW COMPARISON:  02/22/2015 FINDINGS: Heart size and vascularity normal Streaky lung markings are present in the bases bilaterally. These were not present on the prior study. No effusion. IMPRESSION: Patchy bibasilar airspace disease. Probable pneumonia given the  COVID-19 history. Electronically Signed   By: Franchot Gallo M.D.   On: 10/21/2019 11:54   US Renal  Result Date: 10/22/2019 CLINICAL DATA:  Acute renal failure. EXAM: RENAL / URINARY TRACT ULTRASOUND COMPLETE COMPARISON:  Abdominal ultrasound 02/08/2015, CT abdomen/pelvis 08/18/2019 FINDINGS: Right Kidney: Renal measurements: 12.4 x 5.1 x 5.5 cm = volume: 181.3 mL. No hydronephrosis. Increased renal cortical echogenicity. 2.0 x 1.5 x 1.7 cm interpolar renal cyst. 2.5  x 1.4 x 1.3 cm lower pole renal cyst. Left Kidney: Renal measurements: 12.5 x 6.1 x 5.8 cm = volume: 230.9 mL. No hydronephrosis. Increased renal cortical echogenicity. 3.0 x 2.5 x 1.7 cm upper pole renal cyst. 2.0 x 1.9 x 1.8 cm lower pole renal cyst. Bladder: Appears normal for degree of bladder distention. Other: Incidentally noted, there is heterogeneous echotexture of the partially imaged liver. IMPRESSION: No hydronephrosis. Increased renal cortical echogenicity bilaterally suggestive of chronic renal parenchymal disease. Bilateral renal cysts. Incidentally noted heterogeneous echotexture of the partially imaged liver. Findings are nonspecific, but may be seen in the setting of hepatic steatosis or other chronic hepatic parenchymal disease. Electronically Signed   By: Kellie Simmering DO   On: 10/22/2019 10:53   DG CHEST PORT 1 VIEW  Result Date: 10/25/2019 CLINICAL DATA:  Update status covid unit EXAM: PORTABLE CHEST 1 VIEW COMPARISON:  Chest radiograph 10/21/2019 FINDINGS: Stable cardiomediastinal contours with enlarged heart size. Persistent streaky infiltrates in the bilateral lung bases, similar to prior. No new focal infiltrate. No pneumothorax or large pleural effusion. IMPRESSION: Bilateral airspace disease not significantly changed from prior. Electronically Signed   By: Audie Pinto M.D.   On: 10/25/2019 12:27    (Echo, Carotid, EGD, Colonoscopy, ERCP)    Subjective: Resting in bed awake alert anxious to go home and  wants to go home.  Discharge Exam: Vitals:   10/25/19 2115 10/26/19 0500  BP:  (!) 132/93  Pulse: 66 73  Resp: (!) 24 20  Temp:  98 F (36.7 C)  SpO2: 93% 93%   Vitals:   10/25/19 1700 10/25/19 2005 10/25/19 2115 10/26/19 0500  BP:  138/74  (!) 132/93  Pulse:  65 66 73  Resp: 18 (!) 23 (!) 24 20  Temp:  97.6 F (36.4 C)  98 F (36.7 C)  TempSrc:  Oral  Oral  SpO2:  (!) 88% 93% 93%  Weight:    97.5 kg  Height:        General: Pt is alert, awake, not in acute distress Cardiovascular: RRR, S1/S2 +, no rubs, no gallops Respiratory: Few scattered wheezes bilaterally, no wheezing, no rhonchi Abdominal: Soft, NT, ND, bowel sounds + Extremities: no edema, no cyanosis    The results of significant diagnostics from this hospitalization (including imaging, microbiology, ancillary and laboratory) are listed below for reference.     Microbiology: Recent Results (from the past 240 hour(s))  Blood Culture (routine x 2)     Status: None   Collection Time: 10/21/19  4:52 PM   Specimen: BLOOD  Result Value Ref Range Status   Specimen Description BLOOD RIGHT ANTECUBITAL  Final   Special Requests   Final    BOTTLES DRAWN AEROBIC AND ANAEROBIC Blood Culture results may not be optimal due to an inadequate volume of blood received in culture bottles   Culture   Final    NO GROWTH 5 DAYS Performed at [facility], [place] [address], [place], [place] [address]    Report Status 10/26/2019 FINAL  Final  Blood Culture (routine x 2)     Status: None   Collection Time: 10/21/19  4:52 PM   Specimen: BLOOD RIGHT HAND  Result Value Ref Range Status   Specimen Description BLOOD RIGHT HAND  Final   Special Requests   Final    BOTTLES DRAWN AEROBIC AND ANAEROBIC Blood Culture results may not be optimal due to an inadequate volume of blood received in culture bottles  Culture   Final    NO GROWTH 5 DAYS Performed at [facility], [place] [address] [place], [place] [address]    Report  Status 10/26/2019 FINAL  Final  Respiratory Panel by PCR     Status: None   Collection Time: 10/23/19  7:09 AM   Specimen: Nasopharyngeal Swab; Respiratory  Result Value Ref Range Status   Adenovirus NOT DETECTED NOT DETECTED Final   Coronavirus 229E NOT DETECTED NOT DETECTED Final    Comment: (NOTE) The Coronavirus on the Respiratory Panel, DOES NOT test for the novel  Coronavirus (2019 nCoV)    Coronavirus HKU1 NOT DETECTED NOT DETECTED Final   Coronavirus NL63 NOT DETECTED NOT DETECTED Final   Coronavirus OC43 NOT DETECTED NOT DETECTED Final   Metapneumovirus NOT DETECTED NOT DETECTED Final   Rhinovirus / Enterovirus NOT DETECTED NOT DETECTED Final   Influenza A NOT DETECTED NOT DETECTED Final   Influenza B NOT DETECTED NOT DETECTED Final   Parainfluenza Virus 1 NOT DETECTED NOT DETECTED Final   Parainfluenza Virus 2 NOT DETECTED NOT DETECTED Final   Parainfluenza Virus 3 NOT DETECTED NOT DETECTED Final   Parainfluenza Virus 4 NOT DETECTED NOT DETECTED Final   Respiratory Syncytial Virus NOT DETECTED NOT DETECTED Final   Bordetella pertussis NOT DETECTED NOT DETECTED Final   Chlamydophila pneumoniae NOT DETECTED NOT DETECTED Final   Mycoplasma pneumoniae NOT DETECTED NOT DETECTED Final    Comment: Performed at [facility], [place] [address], [place], [place] [address]     Labs: BNP (last 3 results) No results for input(s): BNP in the last 8760 hours. Basic Metabolic Panel: Recent Labs  Lab 10/21/19 1651 10/22/19 0622 10/23/19 0650 10/24/19 0346 10/24/19 1525 10/25/19 0500  NA 138 135 136 139  --  136  K 4.8 5.3* 5.2* 5.3* 4.6 4.8  CL 101 102 105 107  --  101  CO2 25 20* 22 24  --  22  GLUCOSE 123* 244* 227* 140*  --  183*  BUN 42* 49* 49* 45*  --  42*  CREATININE 2.49* 2.18* 1.72* 1.54*  --  1.48*  CALCIUM 8.4* 8.2* 8.0* 8.2*  --  8.4*  MG  --  2.3 2.3 2.3  --  2.1  PHOS  --  5.7* 4.3 3.3  --  3.8   Liver Function Tests: Recent Labs  Lab 10/21/19 1651  10/22/19 0622 10/23/19 0650 10/24/19 0346 10/25/19 0500  AST 24 21 19 20 23   ALT 18 19 17 18 22   ALKPHOS 52 51 43 40 47  BILITOT 0.7 0.5 0.5 0.7 0.6  PROT 6.8 6.6 5.9* 5.8* 6.3*  ALBUMIN 3.6 3.1* 2.9* 3.0* 3.0*   No results for input(s): LIPASE, AMYLASE in the last 168 hours. No results for input(s): AMMONIA in the last 168 hours. CBC: Recent Labs  Lab 10/21/19 1651 10/22/19 0622 10/23/19 0650 10/24/19 0346 10/25/19 0500  WBC 5.0 2.7* 5.2 6.7 6.1  NEUTROABS 3.7 2.1 4.4 5.8 5.2  HGB 16.9 16.2 15.3 15.7 16.3  HCT 51.3 48.8 46.8 47.0 49.1  MCV 94.0 92.8 91.1 90.7 91.4  PLT 148* 159 153 158 181   Cardiac Enzymes: No results for input(s): CKTOTAL, CKMB, CKMBINDEX, TROPONINI in the last 168 hours. BNP: Invalid input(s): POCBNP CBG: Recent Labs  Lab 10/25/19 0746 10/25/19 1153 10/25/19 1644 10/25/19 2129 10/26/19 0737  GLUCAP 167* 200* 138* 165* 174*   D-Dimer Recent Labs    10/24/19 0346 10/25/19 0500  DDIMER  0.39 0.34   Hgb A1c No results for input(s): HGBA1C in the last 72 hours. Lipid Profile No results for input(s): CHOL, HDL, LDLCALC, TRIG, CHOLHDL, LDLDIRECT in the last 72 hours. Thyroid function studies No results for input(s): TSH, T4TOTAL, T3FREE, THYROIDAB in the last 72 hours.  Invalid input(s): FREET3 Anemia work up Recent Labs    10/24/19 0346 10/25/19 0500  FERRITIN 211 223   Urinalysis    Component Value Date/Time   COLORURINE YELLOW 02/07/2015 1409   APPEARANCEUR CLOUDY (A) 02/07/2015 1409   LABSPEC 1.010 02/07/2015 1409   PHURINE 5.0 02/07/2015 1409   GLUCOSEU NEGATIVE 02/07/2015 1409   HGBUR MODERATE (A) 02/07/2015 1409   BILIRUBINUR NEGATIVE 02/07/2015 1409   KETONESUR NEGATIVE 02/07/2015 1409   PROTEINUR 30 (A) 02/07/2015 1409   UROBILINOGEN 1.0 02/07/2015 1409   NITRITE NEGATIVE 02/07/2015 1409   LEUKOCYTESUR NEGATIVE 02/07/2015 1409   Sepsis Labs Invalid input(s): PROCALCITONIN,  WBC,  LACTICIDVEN Microbiology Recent  Results (from the past 240 hour(s))  Blood Culture (routine x 2)     Status: None   Collection Time: 10/21/19  4:52 PM   Specimen: BLOOD  Result Value Ref Range Status   Specimen Description BLOOD RIGHT ANTECUBITAL  Final   Special Requests   Final    BOTTLES DRAWN AEROBIC AND ANAEROBIC Blood Culture results may not be optimal due to an inadequate volume of blood received in culture bottles   Culture   Final    NO GROWTH 5 DAYS Performed at [facility], [place] [address] [place], [place] [address]    Report Status 10/26/2019 FINAL  Final  Blood Culture (routine x 2)     Status: None   Collection Time: 10/21/19  4:52 PM   Specimen: BLOOD RIGHT HAND  Result Value Ref Range Status   Specimen Description BLOOD RIGHT HAND  Final   Special Requests   Final    BOTTLES DRAWN AEROBIC AND ANAEROBIC Blood Culture results may not be optimal due to an inadequate volume of blood received in culture bottles   Culture   Final    NO GROWTH 5 DAYS Performed at [facility], [place] [address], [place], [place] [address]    Report Status 10/26/2019 FINAL  Final  Respiratory Panel by PCR     Status: None   Collection Time: 10/23/19  7:09 AM   Specimen: Nasopharyngeal Swab; Respiratory  Result Value Ref Range Status   Adenovirus NOT DETECTED NOT DETECTED Final   Coronavirus 229E NOT DETECTED NOT DETECTED Final    Comment: (NOTE) The Coronavirus on the Respiratory Panel, DOES NOT test for the novel  Coronavirus (2019 nCoV)    Coronavirus HKU1 NOT DETECTED NOT DETECTED Final   Coronavirus NL63 NOT DETECTED NOT DETECTED Final   Coronavirus OC43 NOT DETECTED NOT DETECTED Final   Metapneumovirus NOT DETECTED NOT DETECTED Final   Rhinovirus / Enterovirus NOT DETECTED NOT DETECTED Final   Influenza A NOT DETECTED NOT DETECTED Final   Influenza B NOT DETECTED NOT DETECTED Final   Parainfluenza Virus 1 NOT DETECTED NOT DETECTED Final   Parainfluenza Virus 2 NOT DETECTED NOT DETECTED Final    Parainfluenza Virus 3 NOT DETECTED NOT DETECTED Final   Parainfluenza Virus 4 NOT DETECTED NOT DETECTED Final   Respiratory Syncytial Virus NOT DETECTED NOT DETECTED Final   Bordetella pertussis NOT DETECTED NOT DETECTED Final   Chlamydophila pneumoniae NOT DETECTED NOT DETECTED Final   Mycoplasma pneumoniae NOT DETECTED NOT DETECTED Final  Comment: Performed at [facility], [place] [address] [place], [place] [address]     Time coordinating discharge: 39 minutes  SIGNED:   Georgette Shell, MD  [facility] 10/26/2019, 10:25 AM Pager   If 7PM-7AM, please contact night-coverage [email or web address] Password [email or web address]

## 2019-10-26 NOTE — TOC Transition Note (Addendum)
Transition of Care [facility]) - CM/SW Discharge Note   Patient Details  Name: DARLE NEWBERG MRN: [id number] Date of Birth: [birth year]  Transition of Care [facility]) CM/SW Contact:  Carles Collet, RN Phone Number: 10/26/2019, 11:43 AM   Clinical Narrative:    [facility] through [place]. [facility] through [place].   Patient states that his son will provide transport home. I asked for him to have his son go to the house to let in [place] prior to his DC so that it cpuld be set up when he got home, but he would rather his son just get him at the hospital and they can call [facility] when they get home to bring concentrator.   Karl Pock [phone] Shawn son- [phone]  Added Aaron Edelman to AVS for Shawn to call, explained to urse that he needs to call Aaron Edelman and to explain this at DC.  [place] number to call as well.   Left VM for Shawn to call Aaron Edelman      Barriers to Discharge: Continued Medical Work up   Patient Goals and CMS Choice Patient states their goals for this hospitalization and ongoing recovery are:: to go home CMS Medicare.gov Compare Post Acute Care list provided to:: Patient Choice offered to / list presented to : Patient  Discharge Placement                       Discharge Plan and Services   Discharge Planning Services: CM Consult Post Acute Care Choice: Home Health          DME Arranged: Oxygen DME Agency: [facility] Date DME Agency Contacted: 10/26/19 Time DME Agency Contacted: [phone] Representative spoke with at DME Agency: referral line [place] Arranged: PT, OT [place] Agency: [place] Date [place]: 10/26/19 Time [place]: 1003 Representative spoke with at [place]: [place] Determinants of Health ([place]) Interventions     Readmission Risk Interventions No flowsheet data found.

## 2019-10-26 NOTE — Progress Notes (Signed)
Nsg Discharge Note  Admit Date:  10/21/2019 Discharge date: 10/26/2019   Gregory [name] to be D/C'd Home per MD order.  AVS completed.  Copy for chart, and copy for patient signed, and dated. Patient/caregiver able to verbalize understanding.  Discharge Medication: Allergies as of 10/26/2019   No Known Allergies     Medication List    TAKE these medications   allopurinol 300 MG tablet Commonly known as: ZYLOPRIM Take 300 mg by mouth daily as needed (gout).   amLODipine-valsartan 10-320 MG tablet Commonly known as: EXFORGE Take 1 tablet by mouth daily.   ascorbic acid 500 MG tablet Commonly known as: VITAMIN C Take 1 tablet (500 mg total) by mouth daily.   atorvastatin 10 MG tablet Commonly known as: LIPITOR Take 10 mg by mouth daily at 6 PM.   bicalutamide 50 MG tablet Commonly known as: CASODEX Take 50 mg by mouth daily.   clopidogrel 75 MG tablet Commonly known as: PLAVIX TAKE 1 TABLET (75 MG TOTAL) BY MOUTH DAILY. What changed: See the new instructions.   dexamethasone 6 MG tablet Commonly known as: DECADRON Take 1 tablet (6 mg total) by mouth 2 (two) times daily.   fluticasone 50 MCG/ACT nasal spray Commonly known as: FLONASE Place 2 sprays into both nostrils daily.   folic acid 1 MG tablet Commonly known as: FOLVITE Take 1 tablet (1 mg total) by mouth daily.   furosemide 40 MG tablet Commonly known as: LASIX TAKE 1 TABLET (40 MG TOTAL) BY MOUTH DAILY. Please keep upcoming appt in January before anymore refills. Thank you What changed:   how much to take  how to take this  when to take this  additional instructions   guaiFENesin 600 MG 12 hr tablet Commonly known as: MUCINEX Take 2 tablets (1,200 mg total) by mouth 2 (two) times daily.   insulin glargine 100 UNIT/ML injection Commonly known as: LANTUS Inject 0.3 mLs (30 Units total) into the skin every morning. What changed: how much to take   ipratropium 17 MCG/ACT inhaler Commonly  known as: ATROVENT HFA Inhale 2 puffs into the lungs every 6 (six) hours.   Ipratropium-Albuterol 20-100 MCG/ACT Aers respimat Commonly known as: COMBIVENT Inhale 1 puff into the lungs every 6 (six) hours as needed for wheezing or shortness of breath.   Januvia 100 MG tablet Generic drug: sitaGLIPtin Take 100 mg by mouth daily.   levalbuterol 45 MCG/ACT inhaler Commonly known as: XOPENEX HFA Inhale 2 puffs into the lungs every 6 (six) hours.   loratadine 10 MG tablet Commonly known as: CLARITIN Take 1 tablet (10 mg total) by mouth daily.   metFORMIN 1000 MG tablet Commonly known as: GLUCOPHAGE Take 1,000 mg by mouth 2 (two) times daily with a meal.   metoprolol tartrate 25 MG tablet Commonly known as: LOPRESSOR Take 0.5 tablets (12.5 mg total) by mouth 2 (two) times daily. What changed:   medication strength  how much to take   multivitamin with minerals Tabs tablet Take 1 tablet by mouth daily.   nitroGLYCERIN 0.4 MG SL tablet Commonly known as: NITROSTAT Place 1 tablet (0.4 mg total) under the tongue every 5 (five) minutes as needed for chest pain.   thiamine 100 MG tablet Take 1 tablet (100 mg total) by mouth daily.   zinc sulfate 220 (50 Zn) MG capsule Take 1 capsule (220 mg total) by mouth daily.            Durable Medical Equipment  (From admission, onward)  Start     Ordered   10/26/19 1149  DME Oxygen  Once    Question Answer Comment  Length of Need 6 Months   Mode or (Route) Nasal cannula   Liters per Minute 3   Frequency Continuous (stationary and portable oxygen unit needed)   Oxygen conserving device Yes   Oxygen delivery system Gas      10/26/19 1148   10/26/19 1017  DME Oxygen  Once    Question Answer Comment  Length of Need 6 Months   Liters per Minute 2   Oxygen delivery system Gas      10/26/19 1017   10/26/19 1016  DME Walker  Once    Question Answer Comment  Walker: With [place] Wheels   Patient needs a walker to  treat with the following condition Unsteady gait when walking      10/26/19 1017   10/25/19 0827  For home use only DME Walker rolling  Once    Question:  Patient needs a walker to treat with the following condition  Answer:  Debility   10/25/19 0826          Discharge Assessment: Vitals:   10/26/19 1300 10/26/19 1500  BP: (!) 147/95   Pulse: 71   Resp: 16 (P) 18  Temp:  (P) 98 F (36.7 C)  SpO2: 95% (P) 91%   Skin clean, dry and intact without evidence of skin break down, no evidence of skin tears noted. IV catheter discontinued intact. Site without signs and symptoms of complications - no redness or edema noted at insertion site, patient denies c/o pain - only slight tenderness at site.  Dressing with slight pressure applied.  D/c Instructions-Education: Discharge instructions given to patient with verbalized understanding. D/c education completed with patient including follow up instructions, medication list, d/c activities limitations if indicated, with other d/c instructions as indicated by MD - patient able to verbalize understanding, all questions fully answered. Patient instructed to return to ED, call 911, or call MD for any changes in condition.  Patient escorted via [place], and D/C home via private auto.  Hassan Rowan, RN 10/26/2019 4:06 PM

## 2019-10-26 NOTE — TOC Initial Note (Signed)
Transition of Care [facility]) - Initial/Assessment Note    Patient Details  Name: Gregory [name] MRN: [id number] Date of Birth: Nov 22, 1950  Transition of Care [facility]) CM/SW Contact:    Carles Collet, RN Phone Number: 10/26/2019, 10:04 AM  Clinical Narrative:            Spoke w patient over the phone, he states that he lives at home alone and would like to return at DC. He has a RW at home. We discussed [place] companies and ratings. He would like to use [facility]. Referral accepted by liaison.  Will need to continue to follow for potential home oxygen.  Will need HH orders.         Expected Discharge Plan: [place] Barriers to Discharge: Continued Medical Work up   Patient Goals and CMS Choice Patient states their goals for this hospitalization and ongoing recovery are:: to go home CMS Medicare.gov Compare Post Acute Care list provided to:: Patient Choice offered to / list presented to : Patient  Expected Discharge Plan and Services Expected Discharge Plan: [place]   Discharge Planning Services: CM Consult Post Acute Care Choice: Home Health                             HH Arranged: PT, OT [facility] Agency: [place] Date [facility] Agency Contacted: 10/26/19 Time HH Agency Contacted: 64 Representative spoke with at [place]: Tommi Rumps  Prior Living Arrangements/Services   Lives with:: Self              Current home services: DME    Activities of Daily Living      Permission Sought/Granted                  Emotional Assessment              Admission diagnosis:  ARF (acute renal failure) ([place]) [N17.9] Acute hypoxemic respiratory failure ([employer]) [J96.01] Pneumonia due to severe acute respiratory syndrome coronavirus 2 (SARS-CoV-2) [U07.1, J12.82] COVID-19 [U07.1] Patient Active Problem List   Diagnosis Date Noted  . Acute respiratory failure due to COVID-19 ([place]) 10/22/2019  . Pneumonia due to severe acute  respiratory syndrome coronavirus 2 (SARS-CoV-2) 10/21/2019  . CAP (community acquired pneumonia) 02/13/2015  . Hyperbilirubinemia   . Acute respiratory failure with hypoxia ([place]) 02/06/2015  . Sepsis ([place]) 02/06/2015  . SIRS (systemic inflammatory response syndrome) ([place]) 02/05/2015  . Acute on chronic renal failure ([place]) 02/05/2015  . Chronic diastolic heart failure ([place]) 02/05/2015  . Cellulitis of left arm   . Essential hypertension   . Cellulitis 08/22/2014  . Diabetes mellitus type 2, uncontrolled ([place]) 08/22/2014  . Chronic kidney disease, stage 3 08/22/2014  . Malignant neoplasm of prostate ([place]) 02/25/2014  . Coronary atherosclerosis of native coronary artery 10/06/2013  . Obesity, unspecified 10/06/2013  . Chest pain 06/03/2012  . DM (diabetes mellitus) ([place]) 06/03/2012  . HTN (hypertension) 06/03/2012  . Hyperlipidemia 06/03/2012   PCP:  Seward Carol, MD Pharmacy:   [facility] - [place], [place] - [address] [place] [address] Phone: [phone] Fax: [phone]     Social Determinants of Health (SDOH) Interventions    Readmission Risk Interventions No flowsheet data found.

## 2019-10-29 ENCOUNTER — Telehealth: Payer: Self-pay | Admitting: Interventional Cardiology

## 2019-10-29 NOTE — Telephone Encounter (Signed)
Called and spoke to patient. He was diagnosed with COVID on 10/21/19. He states that he is completely asymptomatic now and has not had a fever, cough, SOB, or any other Sx. Patient does not want to do a virtual visit.   The policy for the office is that the patient may be seen in the office 10 days after positive result if they are fever free for 3 days prior and asymptomatic or symptoms improving. Made patient that we can keep his appointment onsite on 11/03/19, however if his Sx change or if he develops a fever we will need to switch to virtual. Patient verbalized understanding and thanked me for the call.

## 2019-10-29 NOTE — Telephone Encounter (Signed)
New message   Patient would like to know if he can come in for an office visit on 11/03/19. He states that he was just released from the hospital for covid on Sunday. Please call to discuss.

## 2019-10-30 NOTE — Telephone Encounter (Signed)
If he was hospitalized for COVID, he needs to quarantine for the full 21 days.  I think the 10 day rule is for people who had no sx at all with COVID.   Please postpone to later in Jan.   JV

## 2019-10-31 DIAGNOSIS — U071 COVID-19: Secondary | ICD-10-CM | POA: Diagnosis not present

## 2019-10-31 DIAGNOSIS — E1122 Type 2 diabetes mellitus with diabetic chronic kidney disease: Secondary | ICD-10-CM | POA: Diagnosis not present

## 2019-10-31 DIAGNOSIS — R7989 Other specified abnormal findings of blood chemistry: Secondary | ICD-10-CM | POA: Diagnosis not present

## 2019-10-31 DIAGNOSIS — Z794 Long term (current) use of insulin: Secondary | ICD-10-CM | POA: Diagnosis not present

## 2019-10-31 DIAGNOSIS — E1169 Type 2 diabetes mellitus with other specified complication: Secondary | ICD-10-CM | POA: Diagnosis not present

## 2019-10-31 DIAGNOSIS — E7800 Pure hypercholesterolemia, unspecified: Secondary | ICD-10-CM | POA: Diagnosis not present

## 2019-10-31 DIAGNOSIS — I2510 Atherosclerotic heart disease of native coronary artery without angina pectoris: Secondary | ICD-10-CM | POA: Diagnosis not present

## 2019-10-31 DIAGNOSIS — N189 Chronic kidney disease, unspecified: Secondary | ICD-10-CM | POA: Diagnosis not present

## 2019-10-31 NOTE — Telephone Encounter (Signed)
Spoke to patient and made him aware that Dr. Irish Lack would like for him to wait and be seen in the office later in January due to his recent hospitalization with COVID or transition appointment to virtual. Patient did not want to do a virtual visit. Appointment rescheduled for 1/26 at 11:40 AM. Patient states that he did see his PCP Dr. Delfina Redwood today and was cleared from a COVID perspective.

## 2019-10-31 NOTE — Telephone Encounter (Signed)
Left message to call back regarding his appt on Monday.

## 2019-11-03 ENCOUNTER — Ambulatory Visit: Payer: Medicare HMO | Admitting: Interventional Cardiology

## 2019-11-16 NOTE — Progress Notes (Signed)
Cardiology Office Note   Date:  11/18/2019   ID:  Gregory [name], DOB 28-Mar-1951, MRN [id number]  PCP:  Seward Carol, MD    No chief complaint on file.  CAD  Wt Readings from Last 3 Encounters:  11/18/19 216 lb (98 kg)  10/26/19 214 lb 14.4 oz (97.5 kg)  12/24/17 243 lb (110.2 kg)       History of Present Illness: Gregory [name] is a 69 y.o. male   who had overlapping stents to the LAD, DES in 8/13.  He was hoispitalized in 4/16 for Pneumonia. He did have a cardiology consult due to a prolonged run of nonsustained ventricular tachycardia which occurred early in the morning. The patient was asymptomatic. He was watched on telemetry and beta blockade was increased. He did not have any further episodes on telemetry. There is no syncope. He was sent home and he has felt well.  He works a as Art gallery manager.  His wife passed away a few years ago.   He had COVID in early Jan 2021 and was hospitalized : "Patient had presented with complaints of feeling unwell. Patient was seen at urgent care and Covid test was positive. Patient noted to be hypoxic with sats in the 80s on room air. Patient noted to have elevated inflammatory markers. Chest x-ray which was done with bilateral infiltrates. Patient wast placed on O2. Procalcitonin was 0.21.  Was treated with IV Decadron, remdesivir Mucinex, Flonase, Atrovent and Xopenex MDI.Improving clinically. Patient with sats of 90% on2 L nasal cannula.  He will be discharged home on oxygen and Decadron 6 mg twice a day to finish a course of 10 days.   He had ARF with COVID but creatinine came down to 1.48 with hydration and holding ARB.  Patient with a 7 beat run of asymptomatic nonsustained V. tachon 10/24/2019. Potassium at 4.8. Magnesium at 2.1.  Lopressor continued."  He lost some weight while sick with COVID so now feels better.    Denies : Chest pain. Dizziness. Leg edema. Nitroglycerin use. Orthopnea. Palpitations. Paroxysmal  nocturnal dyspnea. Shortness of breath. Syncope.   Not checking BP at home. No dizziness when changing positions.   Past Medical History:  Diagnosis Date  . Arthritis   . Cancer ([place])   . Coronary artery disease   . Gout   . Hyperlipidemia   . Hypertension   . Myocardial infarction ([place]) 05/2012   "mild"  . Swelling of left lower extremity    LLE; "happens often"  . Type II diabetes mellitus ([place])     Past Surgical History:  Procedure Laterality Date  . BACK SURGERY    . CORONARY ANGIOPLASTY WITH STENT PLACEMENT  06/05/2012   "2; total of 2"  . LEFT HEART CATHETERIZATION WITH CORONARY ANGIOGRAM N/A 06/05/2012   Procedure: LEFT HEART CATHETERIZATION WITH CORONARY ANGIOGRAM;  Surgeon: Jettie Booze, MD;  Location: [facility] CATH LAB;  Service: Cardiovascular;  Laterality: N/A;  possible PCI  . LUMBAR DISC SURGERY  1990's  . LYMPHADENECTOMY Bilateral 02/25/2014   Procedure: [facility] LYMPH NODE DISSECTION";  Surgeon: Molli Hazard, MD;  Location: [facility] ORS;  Service: Urology;  Laterality: Bilateral;  . PROSTATE SURGERY    . ROBOT ASSISTED LAPAROSCOPIC RADICAL PROSTATECTOMY N/A 02/25/2014   Procedure: ROBOTIC ASSISTED LAPAROSCOPIC RADICAL  PROSTATECTOMY,  UMBILICAL HERNIA REPAIR;  Surgeon: Molli Hazard, MD;  Location: [facility] ORS;  Service: Urology;  Laterality: N/A;     Current Outpatient Medications  Medication Sig Dispense Refill  .  allopurinol (ZYLOPRIM) 300 MG tablet Take 300 mg by mouth daily as needed (gout).     Marland Kitchen amLODipine-valsartan (EXFORGE) 10-320 MG per tablet Take 1 tablet by mouth daily.    Marland Kitchen ascorbic acid (VITAMIN C) 500 MG tablet Take 1 tablet (500 mg total) by mouth daily.    Marland Kitchen atorvastatin (LIPITOR) 10 MG tablet Take 10 mg by mouth daily at 6 PM.   1  . bicalutamide (CASODEX) 50 MG tablet Take 50 mg by mouth daily.    . clopidogrel (PLAVIX) 75 MG tablet TAKE 1 TABLET (75 MG TOTAL) BY MOUTH DAILY. (Patient taking differently: Take 75 mg by mouth  daily. ) 90 tablet 0  . dexamethasone (DECADRON) 6 MG tablet Take 1 tablet (6 mg total) by mouth 2 (two) times daily. 10 tablet 0  . fluticasone (FLONASE) 50 MCG/ACT nasal spray Place 2 sprays into both nostrils daily. 2 mL 2  . folic acid (FOLVITE) 1 MG tablet Take 1 tablet (1 mg total) by mouth daily.    . furosemide (LASIX) 40 MG tablet TAKE 1 TABLET (40 MG TOTAL) BY MOUTH DAILY. Please keep upcoming appt in January before anymore refills. Thank you (Patient taking differently: Take 40 mg by mouth daily. ) 30 tablet 1  . guaiFENesin (MUCINEX) 600 MG 12 hr tablet Take 2 tablets (1,200 mg total) by mouth 2 (two) times daily.    . insulin glargine (LANTUS) 100 UNIT/ML injection Inject 0.3 mLs (30 Units total) into the skin every morning. 10 mL 11  . ipratropium (ATROVENT HFA) 17 MCG/ACT inhaler Inhale 2 puffs into the lungs every 6 (six) hours. 1 Inhaler 12  . Ipratropium-Albuterol (COMBIVENT) 20-100 MCG/ACT AERS respimat Inhale 1 puff into the lungs every 6 (six) hours as needed for wheezing or shortness of breath.    Marland Kitchen JANUVIA 100 MG tablet Take 100 mg by mouth daily.  4  . levalbuterol (XOPENEX HFA) 45 MCG/ACT inhaler Inhale 2 puffs into the lungs every 6 (six) hours. 1 Inhaler 12  . loratadine (CLARITIN) 10 MG tablet Take 1 tablet (10 mg total) by mouth daily.    . metFORMIN (GLUCOPHAGE) 1000 MG tablet Take 1,000 mg by mouth 2 (two) times daily with a meal.    . metoprolol tartrate (LOPRESSOR) 25 MG tablet Take 0.5 tablets (12.5 mg total) by mouth 2 (two) times daily. 60 tablet 2  . Multiple Vitamin (MULTIVITAMIN WITH MINERALS) TABS tablet Take 1 tablet by mouth daily.    . nitroGLYCERIN (NITROSTAT) 0.4 MG SL tablet Place 1 tablet (0.4 mg total) under the tongue every 5 (five) minutes as needed for chest pain. 25 tablet 3  . thiamine 100 MG tablet Take 1 tablet (100 mg total) by mouth daily.    Marland Kitchen zinc sulfate 220 (50 Zn) MG capsule Take 1 capsule (220 mg total) by mouth daily.     No current  facility-administered medications for this visit.    Allergies:   Patient has no known allergies.    Social History:  The patient  reports that he quit smoking about 26 years ago. His smoking use included cigarettes. He has a 60.00 pack-year smoking history. He has never used smokeless tobacco. He reports that he does not drink alcohol or use drugs.   Family History:  The patient's family history includes Asthma in his father; Breast cancer in his mother; Parkinson's disease in his father.    ROS:  Please see the history of present illness.   Otherwise, review of systems  are positive for intentional weight loss.   All other systems are reviewed and negative.    PHYSICAL EXAM: VS:  BP (!) 96/54   Pulse 75   Ht 5\' 7"  (1.702 m)   Wt 216 lb (98 kg)   SpO2 97%   BMI 33.83 kg/m  , BMI Body mass index is 33.83 kg/m. GEN: Well nourished, well developed, in no acute distress  HEENT: normal  Neck: no JVD, carotid bruits, or masses Cardiac: RRR; no murmurs, rubs, or gallops,; chronic left ankle edema  Respiratory:  clear to auscultation bilaterally, normal work of breathing GI: soft, nontender, nondistended, + BS MS: no deformity or atrophy  Skin: warm and dry, no rash Neuro:  Strength and sensation are intact Psych: euthymic mood, full affect   EKG:   The ekg ordered today demonstrates    Recent Labs: 10/26/2019: ALT 35; BUN 44; Creatinine, Ser 1.64; Hemoglobin 17.1; Magnesium 2.2; Platelets 207; Potassium 5.1; Sodium 135   Lipid Panel    Component Value Date/Time   TRIG 136 10/21/2019 1651     Other studies Reviewed: Additional studies/ records that were reviewed today with results demonstrating: hospital records reviewed.   ASSESSMENT AND PLAN:  1. CAD: No angina.  Continue aggressive secondary prevention.   2. Diabetes:  A1C 7.1.  Whole food , plant based 3. Chronic renal insufficiency: Avoid nephrotoxins.  OK to use acetaminophen.  4. Hyperlipidemia: LDL 101.  Will  check on meds at home to see fi he is still taking statin.  His meds were changed upon discharge from [place].  5. Hypertension: He is unsure of his meds.     Current medicines are reviewed at length with the patient today.  The patient concerns regarding his medicines were addressed.  The following changes have been made:  No change  Labs/ tests ordered today include:  No orders of the defined types were placed in this encounter.   Recommend 150 minutes/week of aerobic exercise Low fat, low carb, high fiber diet recommended  Disposition:   FU in 6 months.  Will confirm meds by phone soon   Signed, Larae Grooms, MD  11/18/2019 12:19 PM    [address] [place], [place], [place]  [address] Phone: [phone]; Fax: [phone]

## 2019-11-18 ENCOUNTER — Encounter: Payer: Self-pay | Admitting: Interventional Cardiology

## 2019-11-18 ENCOUNTER — Ambulatory Visit (INDEPENDENT_AMBULATORY_CARE_PROVIDER_SITE_OTHER): Payer: Medicare HMO | Admitting: Interventional Cardiology

## 2019-11-18 ENCOUNTER — Other Ambulatory Visit: Payer: Self-pay

## 2019-11-18 VITALS — BP 96/54 | HR 75 | Ht 67.0 in | Wt 216.0 lb

## 2019-11-18 DIAGNOSIS — E1159 Type 2 diabetes mellitus with other circulatory complications: Secondary | ICD-10-CM

## 2019-11-18 DIAGNOSIS — I2510 Atherosclerotic heart disease of native coronary artery without angina pectoris: Secondary | ICD-10-CM | POA: Diagnosis not present

## 2019-11-18 DIAGNOSIS — I10 Essential (primary) hypertension: Secondary | ICD-10-CM

## 2019-11-18 DIAGNOSIS — N1831 Chronic kidney disease, stage 3a: Secondary | ICD-10-CM | POA: Diagnosis not present

## 2019-11-18 DIAGNOSIS — E782 Mixed hyperlipidemia: Secondary | ICD-10-CM | POA: Diagnosis not present

## 2019-11-18 NOTE — Patient Instructions (Signed)
Medication Instructions:  Your physician recommends that you continue on your current medications as directed. Please refer to the Current Medication list given to you today.  Give [place] a call to verify what medications you are taking.  *If you need a refill on your cardiac medications before your next appointment, please call your pharmacy*  Lab Work: None ordered  If you have labs (blood work) drawn today and your tests are completely normal, you will receive your results only by: Marland Kitchen MyChart Message (if you have MyChart) OR . A paper copy in the mail If you have any lab test that is abnormal or we need to change your treatment, we will call you to review the results.  Testing/Procedures: None ordered  Follow-Up: At [facility], you and your health needs are our priority.  As part of our continuing mission to provide you with exceptional heart care, we have created designated Provider Care Teams.  These Care Teams include your primary Cardiologist (physician) and Advanced Practice Providers (APPs -  Physician Assistants and Nurse Practitioners) who all work together to provide you with the care you need, when you need it.  Your next appointment:   6 month(s)  The format for your next appointment:   In Person  Provider:   You may see Larae Grooms, MD or one of the following Advanced Practice Providers on your designated Care Team:    Melina Copa, PA-C  Ermalinda Barrios, PA-C   Other Instructions  High-Fiber Diet Fiber, also called dietary fiber, is a type of carbohydrate that is found in fruits, vegetables, whole grains, and beans. A high-fiber diet can have many health benefits. Your health care provider may recommend a high-fiber diet to help:  Prevent constipation. Fiber can make your bowel movements more regular.  Lower your cholesterol.  Relieve the following conditions: ? Swelling of veins in the anus (hemorrhoids). ? Swelling and irritation (inflammation) of  specific areas of the digestive tract (uncomplicated diverticulosis). ? A problem of the large intestine (colon) that sometimes causes pain and diarrhea (irritable bowel syndrome, IBS).  Prevent overeating as part of a weight-loss plan.  Prevent heart disease, type 2 diabetes, and certain cancers. What is my plan? The recommended daily fiber intake in grams (g) includes:  38 g for men age 44 or younger.  30 g for men over age 41.  82 g for women age 30 or younger.  21 g for women over age 50. You can get the recommended daily intake of dietary fiber by:  Eating a variety of fruits, vegetables, grains, and beans.  Taking a fiber supplement, if it is not possible to get enough fiber through your diet. What do I need to know about a high-fiber diet?  It is better to get fiber through food sources rather than from fiber supplements. There is not a lot of research about how effective supplements are.  Always check the fiber content on the nutrition facts label of any prepackaged food. Look for foods that contain 5 g of fiber or more per serving.  Talk with a diet and nutrition specialist (dietitian) if you have questions about specific foods that are recommended or not recommended for your medical condition, especially if those foods are not listed below.  Gradually increase how much fiber you consume. If you increase your intake of dietary fiber too quickly, you may have bloating, cramping, or gas.  Drink plenty of water. Water helps you to digest fiber. What are tips for following  this plan?  Eat a wide variety of high-fiber foods.  Make sure that half of the grains that you eat each day are whole grains.  Eat breads and cereals that are made with whole-grain flour instead of refined flour or white flour.  Eat brown rice, bulgur wheat, or millet instead of white rice.  Start the day with a breakfast that is high in fiber, such as a cereal that contains 5 g of fiber or more per  serving.  Use beans in place of meat in soups, salads, and pasta dishes.  Eat high-fiber snacks, such as berries, raw vegetables, nuts, and popcorn.  Choose whole fruits and vegetables instead of processed forms like juice or sauce. What foods can I eat?  Fruits Berries. Pears. Apples. Oranges. Avocado. Prunes and raisins. Dried figs. Vegetables Sweet potatoes. Spinach. Kale. Artichokes. Cabbage. Broccoli. Cauliflower. Green peas. Carrots. Squash. Grains Whole-grain breads. Multigrain cereal. Oats and oatmeal. Brown rice. Barley. Bulgur wheat. [place]. Quinoa. Bran muffins. Popcorn. Rye wafer crackers. Meats and other proteins [place], kidney, and pinto beans. Soybeans. Split peas. Lentils. Nuts and seeds. Dairy Fiber-fortified yogurt. Beverages Fiber-fortified soy milk. Fiber-fortified orange juice. Other foods Fiber bars. The items listed above may not be a complete list of recommended foods and beverages. Contact a dietitian for more options. What foods are not recommended? Fruits Fruit juice. Cooked, strained fruit. Vegetables Fried potatoes. Canned vegetables. Well-cooked vegetables. Grains White bread. Pasta made with refined flour. White rice. Meats and other proteins Fatty cuts of meat. Fried chicken or fried fish. Dairy Milk. Yogurt. Cream cheese. Sour cream. Fats and oils Butters. Beverages Soft drinks. Other foods Cakes and pastries. The items listed above may not be a complete list of foods and beverages to avoid. Contact a dietitian for more information. Summary  Fiber is a type of carbohydrate. It is found in fruits, vegetables, whole grains, and beans.  There are many health benefits of eating a high-fiber diet, such as preventing constipation, lowering blood cholesterol, helping with weight loss, and reducing your risk of heart disease, diabetes, and certain cancers.  Gradually increase your intake of fiber. Increasing too fast can result in cramping,  bloating, and gas. Drink plenty of water while you increase your fiber.  The best sources of fiber include whole fruits and vegetables, whole grains, nuts, seeds, and beans. This information is not intended to replace advice given to you by your health care provider. Make sure you discuss any questions you have with your health care provider. Document Revised: 08/13/2017 Document Reviewed: 08/13/2017 Elsevier Patient Education  2020 [place] refers to food and lifestyle choices that are based on the traditions of countries located on the [employer]. This way of eating has been shown to help prevent certain conditions and improve outcomes for people who have chronic diseases, like kidney disease and heart disease. What are tips for following this plan? Lifestyle  Cook and eat meals together with your family, when possible.  Drink enough fluid to keep your urine clear or pale yellow.  Be physically active every day. This includes: ? Aerobic exercise like running or swimming. ? Leisure activities like gardening, walking, or housework.  Get 7-8 hours of sleep each night.  If recommended by your health care provider, drink red wine in moderation. This means 1 glass a day for nonpregnant women and 2 glasses a day for men. A glass of wine equals 5 oz (150 mL). Reading food labels  Check the serving size of packaged foods. For foods such as rice and pasta, the serving size refers to the amount of cooked product, not dry.  Check the total fat in packaged foods. Avoid foods that have saturated fat or trans fats.  Check the ingredients list for added sugars, such as corn syrup. Shopping  At the grocery store, buy most of your food from the areas near the walls of the store. This includes: ? Fresh fruits and vegetables (produce). ? Grains, beans, nuts, and seeds. Some of these may be available in unpackaged forms or large amounts (in  bulk). ? Fresh seafood. ? Poultry and eggs. ? Low-fat dairy products.  Buy whole ingredients instead of prepackaged foods.  Buy fresh fruits and vegetables in-season from local farmers markets.  Buy frozen fruits and vegetables in resealable bags.  If you do not have access to quality fresh seafood, buy precooked frozen shrimp or canned fish, such as tuna, salmon, or sardines.  Buy small amounts of raw or cooked vegetables, salads, or olives from the deli or salad bar at your store.  Stock your pantry so you always have certain foods on hand, such as olive oil, canned tuna, canned tomatoes, rice, pasta, and beans. Cooking  Cook foods with extra-virgin olive oil instead of using butter or other vegetable oils.  Have meat as a side dish, and have vegetables or grains as your main dish. This means having meat in small portions or adding small amounts of meat to foods like pasta or stew.  Use beans or vegetables instead of meat in common dishes like chili or lasagna.  Experiment with different cooking methods. Try roasting or broiling vegetables instead of steaming or sauteing them.  Add frozen vegetables to soups, stews, pasta, or rice.  Add nuts or seeds for added healthy fat at each meal. You can add these to yogurt, salads, or vegetable dishes.  Marinate fish or vegetables using olive oil, lemon juice, garlic, and fresh herbs. Meal planning   Plan to eat 1 vegetarian meal one day each week. Try to work up to 2 vegetarian meals, if possible.  Eat seafood 2 or more times a week.  Have healthy snacks readily available, such as: ? Vegetable sticks with hummus. ? [place] yogurt. ? Fruit and nut trail mix.  Eat balanced meals throughout the week. This includes: ? Fruit: 2-3 servings a day ? Vegetables: 4-5 servings a day ? Low-fat dairy: 2 servings a day ? Fish, poultry, or lean meat: 1 serving a day ? Beans and legumes: 2 or more servings a week ? Nuts and seeds: 1-2  servings a day ? Whole grains: 6-8 servings a day ? Extra-virgin olive oil: 3-4 servings a day  Limit red meat and sweets to only a few servings a month What are my food choices?  Mediterranean diet ? Recommended  Grains: Whole-grain pasta. Brown rice. Bulgar wheat. Polenta. Couscous. Whole-wheat bread. [place].  Vegetables: Artichokes. Beets. Broccoli. Cabbage. Carrots. Eggplant. Green beans. Chard. Kale. Spinach. Onions. Leeks. Peas. Squash. Tomatoes. Peppers. Radishes.  Fruits: Apples. Apricots. Avocado. Berries. Bananas. Cherries. Dates. Figs. Grapes. Lemons. Melon. Oranges. Peaches. Plums. Pomegranate.  Meats and other protein foods: Beans. Almonds. Sunflower seeds. Pine nuts. Peanuts. [place]. Salmon. Scallops. Shrimp. [place]. Tilapia. Clams. Oysters. Eggs.  Dairy: Low-fat milk. Cheese. Greek yogurt.  Beverages: Water. Red wine. Herbal tea.  Fats and oils: Extra virgin olive oil. Avocado oil. Grape seed oil.  Sweets and desserts: [place] yogurt with honey. Baked  apples. Poached pears. Trail mix.  Seasoning and other foods: Basil. Cilantro. Coriander. Cumin. Mint. Parsley. Sage. Rosemary. Tarragon. Garlic. Oregano. Thyme. Pepper. Balsalmic vinegar. Tahini. Hummus. Tomato sauce. Olives. Mushrooms. ? Limit these  Grains: Prepackaged pasta or rice dishes. Prepackaged cereal with added sugar.  Vegetables: Deep fried potatoes (french fries).  Fruits: Fruit canned in syrup.  Meats and other protein foods: Beef. Pork. Lamb. Poultry with skin. Hot dogs. [place] Salines.  Dairy: Ice cream. Sour cream. Whole milk.  Beverages: Juice. Sugar-sweetened soft drinks. Beer. Liquor and spirits.  Fats and oils: Butter. Canola oil. Vegetable oil. Beef fat (tallow). Lard.  Sweets and desserts: Cookies. Cakes. Pies. Candy.  Seasoning and other foods: Mayonnaise. Premade sauces and marinades. The items listed may not be a complete list. Talk with your dietitian about what dietary choices are right  for you. Summary  The Mediterranean diet includes both food and lifestyle choices.  Eat a variety of fresh fruits and vegetables, beans, nuts, seeds, and whole grains.  Limit the amount of red meat and sweets that you eat.  Talk with your health care provider about whether it is safe for you to drink red wine in moderation. This means 1 glass a day for nonpregnant women and 2 glasses a day for men. A glass of wine equals 5 oz (150 mL). This information is not intended to replace advice given to you by your health care provider. Make sure you discuss any questions you have with your health care provider. Document Revised: 06/08/2016 Document Reviewed: 06/01/2016 Elsevier Patient Education  [place].

## 2019-11-21 DIAGNOSIS — C61 Malignant neoplasm of prostate: Secondary | ICD-10-CM | POA: Diagnosis not present

## 2019-11-21 DIAGNOSIS — E1169 Type 2 diabetes mellitus with other specified complication: Secondary | ICD-10-CM | POA: Diagnosis not present

## 2019-11-21 DIAGNOSIS — N189 Chronic kidney disease, unspecified: Secondary | ICD-10-CM | POA: Diagnosis not present

## 2019-11-21 DIAGNOSIS — E1122 Type 2 diabetes mellitus with diabetic chronic kidney disease: Secondary | ICD-10-CM | POA: Diagnosis not present

## 2019-11-21 DIAGNOSIS — I10 Essential (primary) hypertension: Secondary | ICD-10-CM | POA: Diagnosis not present

## 2019-11-21 DIAGNOSIS — E782 Mixed hyperlipidemia: Secondary | ICD-10-CM | POA: Diagnosis not present

## 2019-11-21 DIAGNOSIS — I2510 Atherosclerotic heart disease of native coronary artery without angina pectoris: Secondary | ICD-10-CM | POA: Diagnosis not present

## 2019-11-21 DIAGNOSIS — N182 Chronic kidney disease, stage 2 (mild): Secondary | ICD-10-CM | POA: Diagnosis not present

## 2019-11-21 DIAGNOSIS — E7800 Pure hypercholesterolemia, unspecified: Secondary | ICD-10-CM | POA: Diagnosis not present

## 2019-11-24 ENCOUNTER — Telehealth: Payer: Self-pay | Admitting: Interventional Cardiology

## 2019-11-24 NOTE — Telephone Encounter (Signed)
Patient is calling with current list of medications:  amLODipine-valsartan (EXFORGE) 10-320 MG per tablet furosemide (LASIX) 40 MG tablet metoprolol tartrate (LOPRESSOR) 100 MG tablet metFORMIN (GLUCOPHAGE) 1000 MG tablet  allopurinol (ZYLOPRIM) 300 MG tablet    clopidogrel (PLAVIX) 75 MG tablet    atorvastatin (LIPITOR) 10 MG tablet

## 2019-11-24 NOTE — Telephone Encounter (Signed)
Called and spoke to patient. He states that he is not home right now and requests that I call him back around 3 PM.

## 2019-11-24 NOTE — Telephone Encounter (Signed)
Called and spoke to patient and reviewed meds and updated his med list.

## 2019-11-25 NOTE — Telephone Encounter (Signed)
Left message instructing patient to continue currently meds and let us know if he developed any Sx of dizziness.

## 2019-11-25 NOTE — Telephone Encounter (Signed)
Continue current meds as long as he is not having any sx such as dizziness.   JV

## 2019-11-30 ENCOUNTER — Ambulatory Visit: Payer: Self-pay

## 2019-12-08 ENCOUNTER — Other Ambulatory Visit: Payer: Self-pay | Admitting: Interventional Cardiology

## 2019-12-19 ENCOUNTER — Ambulatory Visit: Payer: Self-pay

## 2019-12-19 DIAGNOSIS — E7800 Pure hypercholesterolemia, unspecified: Secondary | ICD-10-CM | POA: Diagnosis not present

## 2019-12-19 DIAGNOSIS — I2510 Atherosclerotic heart disease of native coronary artery without angina pectoris: Secondary | ICD-10-CM | POA: Diagnosis not present

## 2019-12-19 DIAGNOSIS — N182 Chronic kidney disease, stage 2 (mild): Secondary | ICD-10-CM | POA: Diagnosis not present

## 2019-12-19 DIAGNOSIS — E1122 Type 2 diabetes mellitus with diabetic chronic kidney disease: Secondary | ICD-10-CM | POA: Diagnosis not present

## 2019-12-19 DIAGNOSIS — E1169 Type 2 diabetes mellitus with other specified complication: Secondary | ICD-10-CM | POA: Diagnosis not present

## 2019-12-19 DIAGNOSIS — E782 Mixed hyperlipidemia: Secondary | ICD-10-CM | POA: Diagnosis not present

## 2019-12-19 DIAGNOSIS — I10 Essential (primary) hypertension: Secondary | ICD-10-CM | POA: Diagnosis not present

## 2019-12-19 DIAGNOSIS — N189 Chronic kidney disease, unspecified: Secondary | ICD-10-CM | POA: Diagnosis not present

## 2019-12-19 DIAGNOSIS — C61 Malignant neoplasm of prostate: Secondary | ICD-10-CM | POA: Diagnosis not present

## 2019-12-31 DIAGNOSIS — N189 Chronic kidney disease, unspecified: Secondary | ICD-10-CM | POA: Diagnosis not present

## 2019-12-31 DIAGNOSIS — I2510 Atherosclerotic heart disease of native coronary artery without angina pectoris: Secondary | ICD-10-CM | POA: Diagnosis not present

## 2019-12-31 DIAGNOSIS — N182 Chronic kidney disease, stage 2 (mild): Secondary | ICD-10-CM | POA: Diagnosis not present

## 2019-12-31 DIAGNOSIS — E782 Mixed hyperlipidemia: Secondary | ICD-10-CM | POA: Diagnosis not present

## 2019-12-31 DIAGNOSIS — E7800 Pure hypercholesterolemia, unspecified: Secondary | ICD-10-CM | POA: Diagnosis not present

## 2019-12-31 DIAGNOSIS — E1122 Type 2 diabetes mellitus with diabetic chronic kidney disease: Secondary | ICD-10-CM | POA: Diagnosis not present

## 2019-12-31 DIAGNOSIS — E1169 Type 2 diabetes mellitus with other specified complication: Secondary | ICD-10-CM | POA: Diagnosis not present

## 2019-12-31 DIAGNOSIS — C61 Malignant neoplasm of prostate: Secondary | ICD-10-CM | POA: Diagnosis not present

## 2019-12-31 DIAGNOSIS — I10 Essential (primary) hypertension: Secondary | ICD-10-CM | POA: Diagnosis not present

## 2020-01-05 DIAGNOSIS — C61 Malignant neoplasm of prostate: Secondary | ICD-10-CM | POA: Diagnosis not present

## 2020-01-12 DIAGNOSIS — C775 Secondary and unspecified malignant neoplasm of intrapelvic lymph nodes: Secondary | ICD-10-CM | POA: Diagnosis not present

## 2020-01-12 DIAGNOSIS — N5201 Erectile dysfunction due to arterial insufficiency: Secondary | ICD-10-CM | POA: Diagnosis not present

## 2020-01-12 DIAGNOSIS — C61 Malignant neoplasm of prostate: Secondary | ICD-10-CM | POA: Diagnosis not present

## 2020-01-27 DIAGNOSIS — C61 Malignant neoplasm of prostate: Secondary | ICD-10-CM | POA: Diagnosis not present

## 2020-01-27 DIAGNOSIS — Z5111 Encounter for antineoplastic chemotherapy: Secondary | ICD-10-CM | POA: Diagnosis not present

## 2020-02-03 DIAGNOSIS — E1169 Type 2 diabetes mellitus with other specified complication: Secondary | ICD-10-CM | POA: Diagnosis not present

## 2020-02-03 DIAGNOSIS — I10 Essential (primary) hypertension: Secondary | ICD-10-CM | POA: Diagnosis not present

## 2020-02-03 DIAGNOSIS — E1122 Type 2 diabetes mellitus with diabetic chronic kidney disease: Secondary | ICD-10-CM | POA: Diagnosis not present

## 2020-02-03 DIAGNOSIS — I2510 Atherosclerotic heart disease of native coronary artery without angina pectoris: Secondary | ICD-10-CM | POA: Diagnosis not present

## 2020-02-03 DIAGNOSIS — N182 Chronic kidney disease, stage 2 (mild): Secondary | ICD-10-CM | POA: Diagnosis not present

## 2020-02-03 DIAGNOSIS — E7800 Pure hypercholesterolemia, unspecified: Secondary | ICD-10-CM | POA: Diagnosis not present

## 2020-02-03 DIAGNOSIS — N189 Chronic kidney disease, unspecified: Secondary | ICD-10-CM | POA: Diagnosis not present

## 2020-02-03 DIAGNOSIS — C61 Malignant neoplasm of prostate: Secondary | ICD-10-CM | POA: Diagnosis not present

## 2020-02-03 DIAGNOSIS — E782 Mixed hyperlipidemia: Secondary | ICD-10-CM | POA: Diagnosis not present

## 2020-02-27 DIAGNOSIS — E782 Mixed hyperlipidemia: Secondary | ICD-10-CM | POA: Diagnosis not present

## 2020-02-27 DIAGNOSIS — N182 Chronic kidney disease, stage 2 (mild): Secondary | ICD-10-CM | POA: Diagnosis not present

## 2020-02-27 DIAGNOSIS — E7800 Pure hypercholesterolemia, unspecified: Secondary | ICD-10-CM | POA: Diagnosis not present

## 2020-02-27 DIAGNOSIS — E1169 Type 2 diabetes mellitus with other specified complication: Secondary | ICD-10-CM | POA: Diagnosis not present

## 2020-02-27 DIAGNOSIS — E1122 Type 2 diabetes mellitus with diabetic chronic kidney disease: Secondary | ICD-10-CM | POA: Diagnosis not present

## 2020-02-27 DIAGNOSIS — I2510 Atherosclerotic heart disease of native coronary artery without angina pectoris: Secondary | ICD-10-CM | POA: Diagnosis not present

## 2020-02-27 DIAGNOSIS — C61 Malignant neoplasm of prostate: Secondary | ICD-10-CM | POA: Diagnosis not present

## 2020-02-27 DIAGNOSIS — N189 Chronic kidney disease, unspecified: Secondary | ICD-10-CM | POA: Diagnosis not present

## 2020-02-27 DIAGNOSIS — I10 Essential (primary) hypertension: Secondary | ICD-10-CM | POA: Diagnosis not present

## 2020-03-15 DIAGNOSIS — E1169 Type 2 diabetes mellitus with other specified complication: Secondary | ICD-10-CM | POA: Diagnosis not present

## 2020-03-15 DIAGNOSIS — I2510 Atherosclerotic heart disease of native coronary artery without angina pectoris: Secondary | ICD-10-CM | POA: Diagnosis not present

## 2020-03-15 DIAGNOSIS — Z1389 Encounter for screening for other disorder: Secondary | ICD-10-CM | POA: Diagnosis not present

## 2020-03-15 DIAGNOSIS — E7800 Pure hypercholesterolemia, unspecified: Secondary | ICD-10-CM | POA: Diagnosis not present

## 2020-03-15 DIAGNOSIS — N1831 Chronic kidney disease, stage 3a: Secondary | ICD-10-CM | POA: Diagnosis not present

## 2020-03-15 DIAGNOSIS — Z0000 Encounter for general adult medical examination without abnormal findings: Secondary | ICD-10-CM | POA: Diagnosis not present

## 2020-03-15 DIAGNOSIS — I10 Essential (primary) hypertension: Secondary | ICD-10-CM | POA: Diagnosis not present

## 2020-04-21 DIAGNOSIS — C61 Malignant neoplasm of prostate: Secondary | ICD-10-CM | POA: Diagnosis not present

## 2020-04-21 DIAGNOSIS — E782 Mixed hyperlipidemia: Secondary | ICD-10-CM | POA: Diagnosis not present

## 2020-04-21 DIAGNOSIS — N182 Chronic kidney disease, stage 2 (mild): Secondary | ICD-10-CM | POA: Diagnosis not present

## 2020-04-21 DIAGNOSIS — E7800 Pure hypercholesterolemia, unspecified: Secondary | ICD-10-CM | POA: Diagnosis not present

## 2020-04-21 DIAGNOSIS — E1169 Type 2 diabetes mellitus with other specified complication: Secondary | ICD-10-CM | POA: Diagnosis not present

## 2020-04-21 DIAGNOSIS — N189 Chronic kidney disease, unspecified: Secondary | ICD-10-CM | POA: Diagnosis not present

## 2020-04-21 DIAGNOSIS — I10 Essential (primary) hypertension: Secondary | ICD-10-CM | POA: Diagnosis not present

## 2020-04-21 DIAGNOSIS — I2510 Atherosclerotic heart disease of native coronary artery without angina pectoris: Secondary | ICD-10-CM | POA: Diagnosis not present

## 2020-04-21 DIAGNOSIS — E1122 Type 2 diabetes mellitus with diabetic chronic kidney disease: Secondary | ICD-10-CM | POA: Diagnosis not present

## 2020-04-27 DIAGNOSIS — C61 Malignant neoplasm of prostate: Secondary | ICD-10-CM | POA: Diagnosis not present

## 2020-05-04 DIAGNOSIS — E1122 Type 2 diabetes mellitus with diabetic chronic kidney disease: Secondary | ICD-10-CM | POA: Diagnosis not present

## 2020-05-04 DIAGNOSIS — N189 Chronic kidney disease, unspecified: Secondary | ICD-10-CM | POA: Diagnosis not present

## 2020-05-04 DIAGNOSIS — E7800 Pure hypercholesterolemia, unspecified: Secondary | ICD-10-CM | POA: Diagnosis not present

## 2020-05-04 DIAGNOSIS — N182 Chronic kidney disease, stage 2 (mild): Secondary | ICD-10-CM | POA: Diagnosis not present

## 2020-05-04 DIAGNOSIS — C61 Malignant neoplasm of prostate: Secondary | ICD-10-CM | POA: Diagnosis not present

## 2020-05-04 DIAGNOSIS — E782 Mixed hyperlipidemia: Secondary | ICD-10-CM | POA: Diagnosis not present

## 2020-05-04 DIAGNOSIS — I10 Essential (primary) hypertension: Secondary | ICD-10-CM | POA: Diagnosis not present

## 2020-05-04 DIAGNOSIS — E1169 Type 2 diabetes mellitus with other specified complication: Secondary | ICD-10-CM | POA: Diagnosis not present

## 2020-05-04 DIAGNOSIS — I2510 Atherosclerotic heart disease of native coronary artery without angina pectoris: Secondary | ICD-10-CM | POA: Diagnosis not present

## 2020-05-23 IMAGING — DX DG CHEST 2V
2 series · 2 of 2 positions shown · non-contrast
Comparison: 02/22/2015

CLINICAL DATA: Short of breath.  TTVNU-WE positive

EXAM:
CHEST - 2 VIEW

[chest pa]
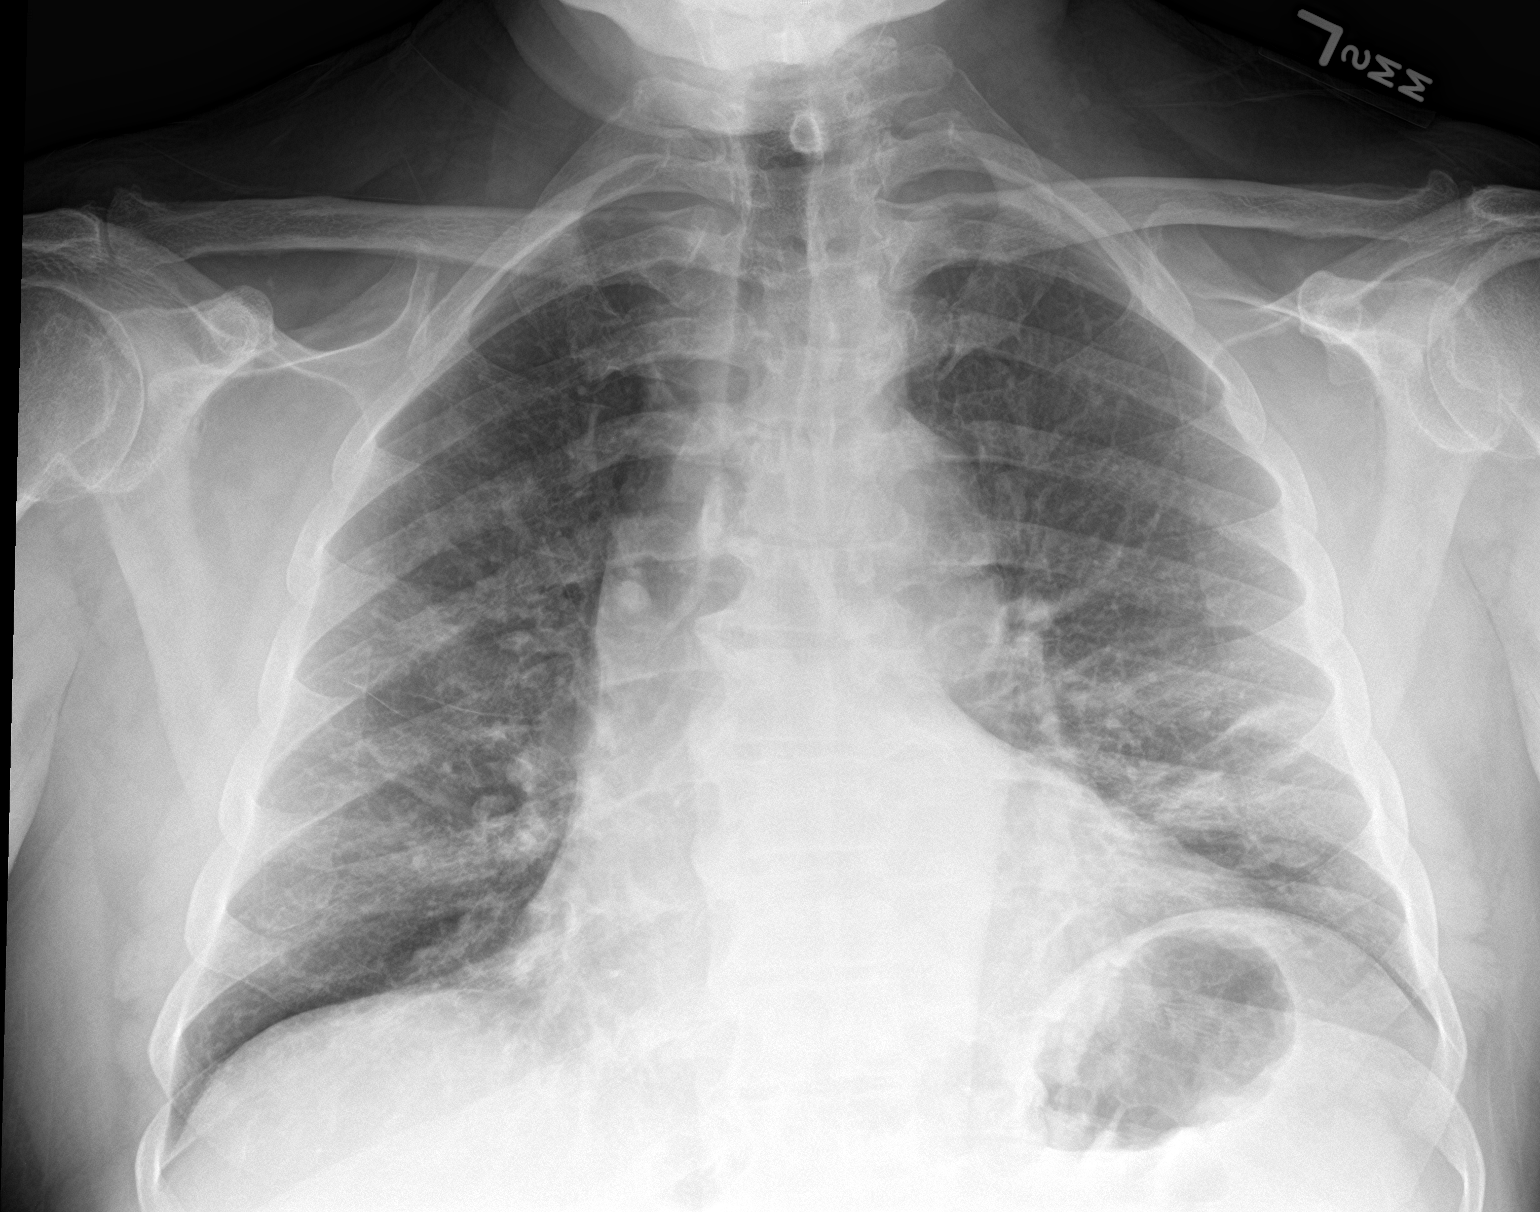

[chest lat]
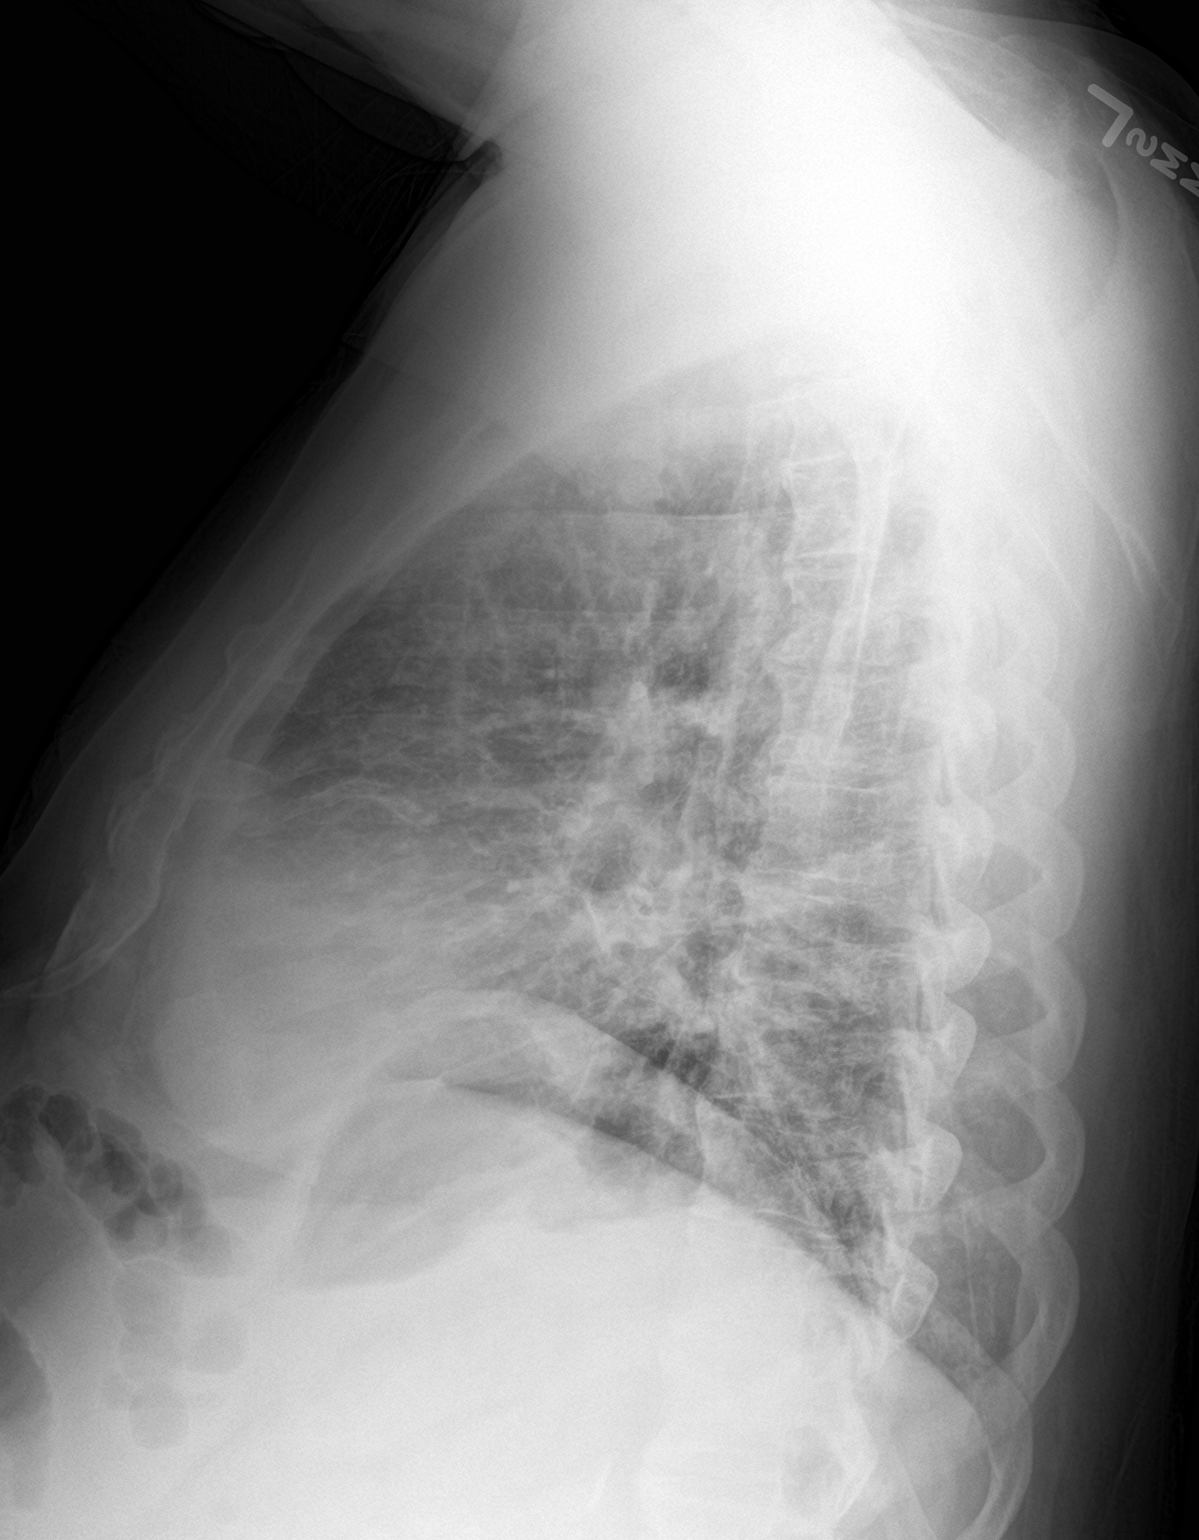

[2 of 2 positions shown; findings below may reference images not displayed]

FINDINGS: Heart size and vascularity normal

Streaky lung markings are present in the bases bilaterally. These
were not present on the prior study. No effusion.
IMPRESSION: Patchy bibasilar airspace disease. Probable pneumonia given the
TTVNU-WE history.

## 2020-05-24 IMAGING — US US RENAL
1 series · 14 of 25 positions shown · non-contrast
Comparison: Abdominal ultrasound 02/08/2015, CT abdomen/pelvis
08/18/2019

CLINICAL DATA: Acute renal failure.

EXAM:
RENAL / URINARY TRACT ULTRASOUND COMPLETE

[Series 1: us renal · 14 of 52 slices shown]
[im 1/52]
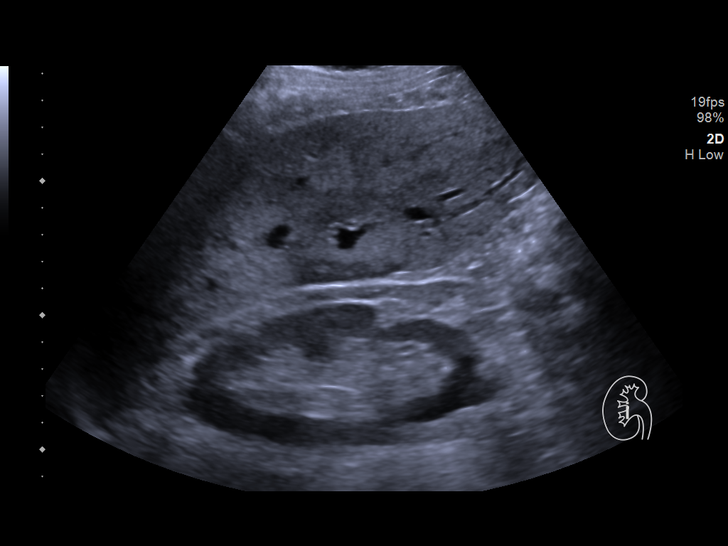
[im 5/52]
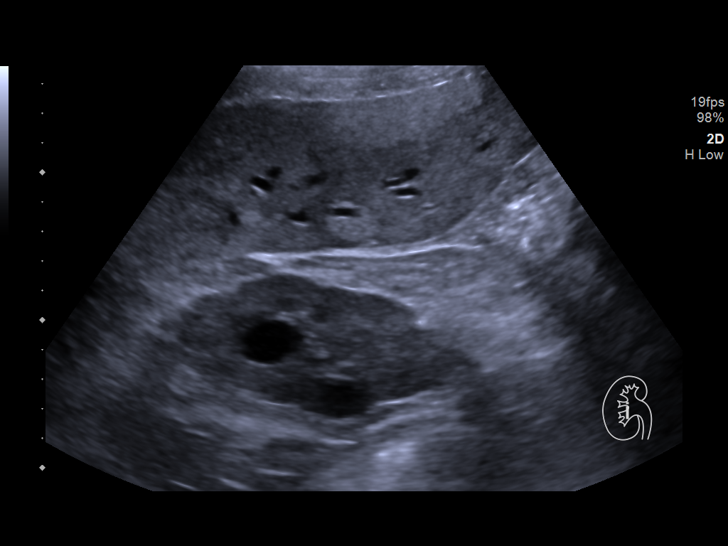
[im 9/52]
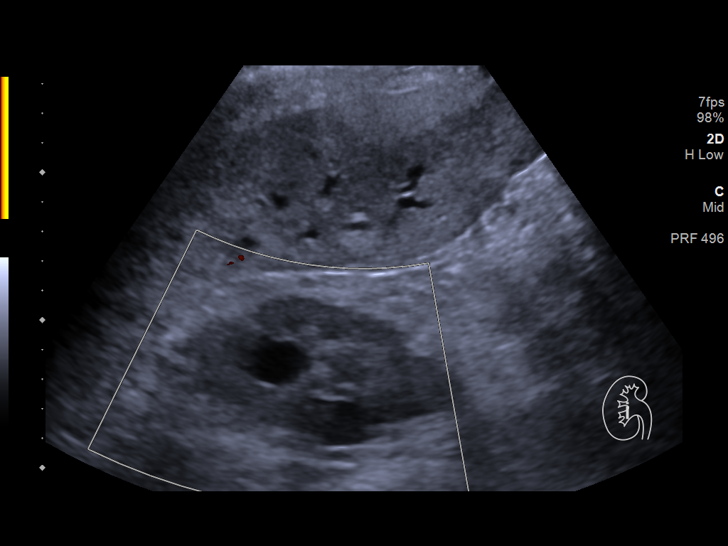
[im 13/52]
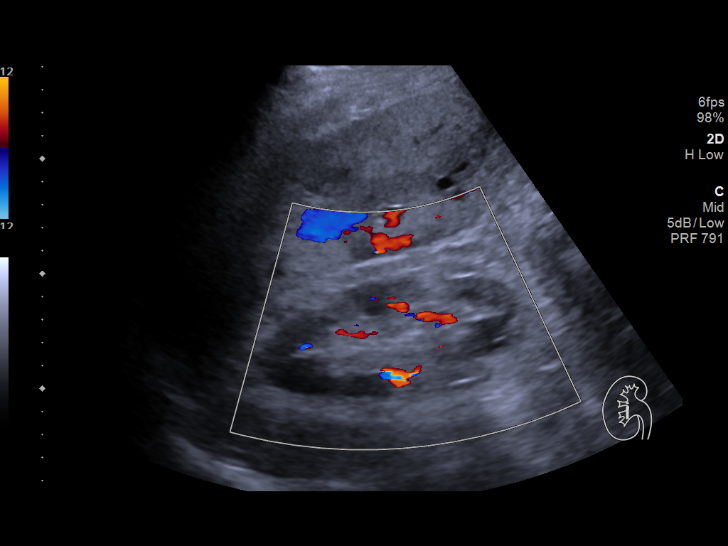
[im 18/52]
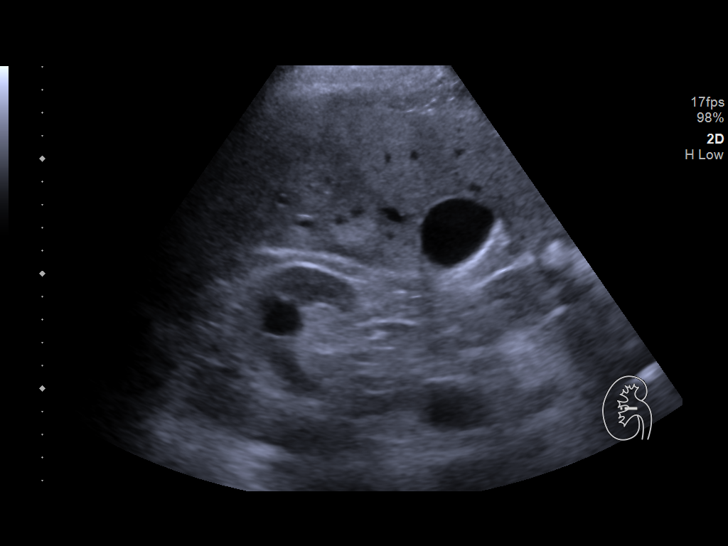
[im 20/52]
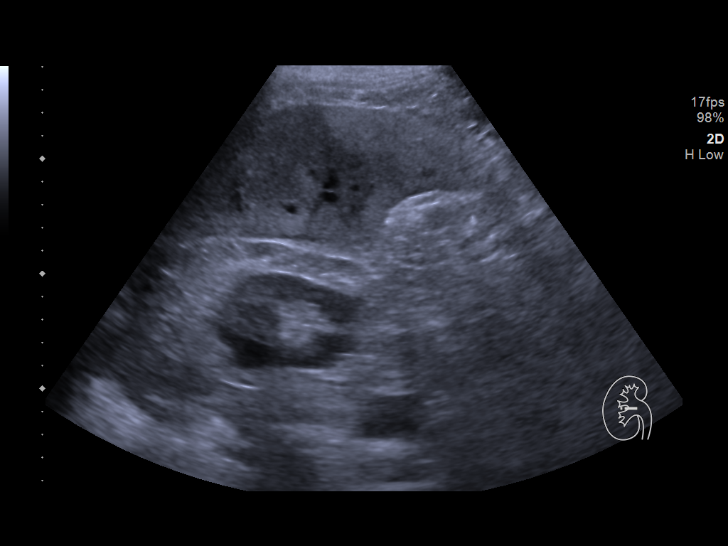
[im 24/52]
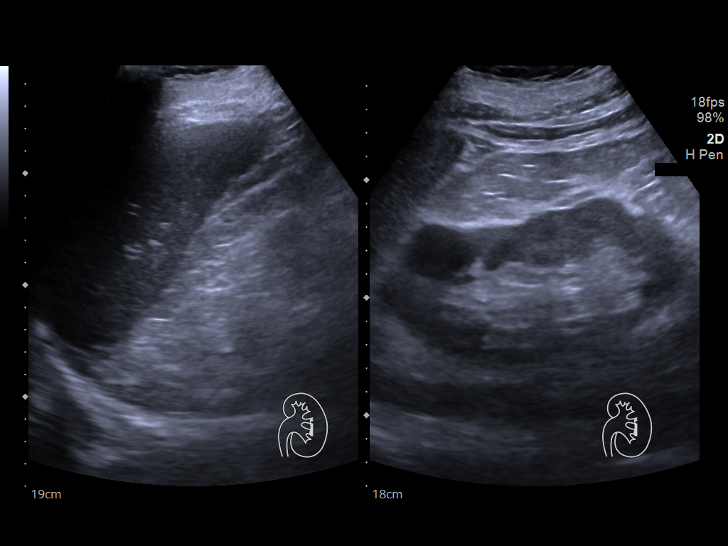
[im 28/52]
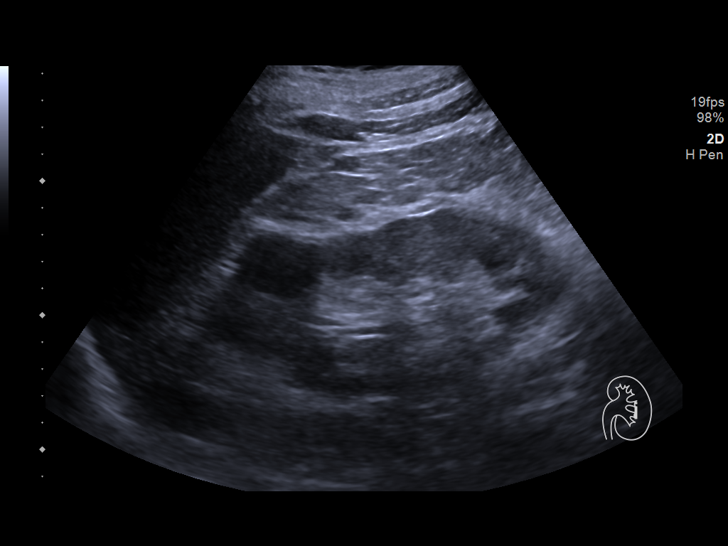
[im 32/52]
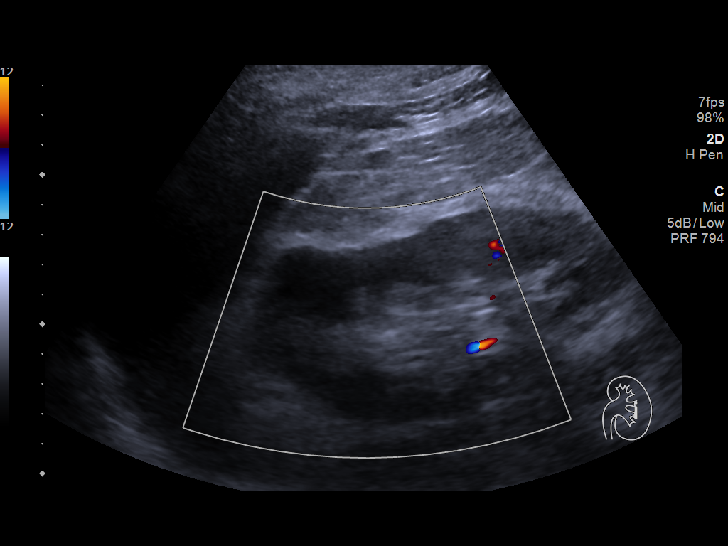
[im 35/52]
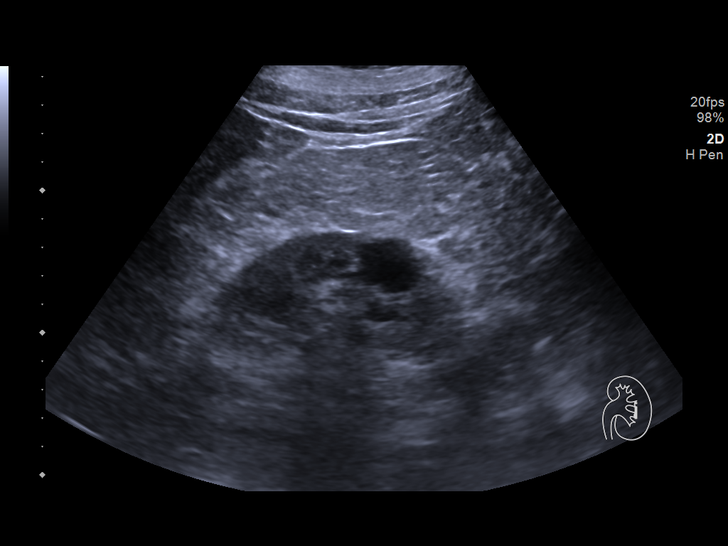
[im 39/52]
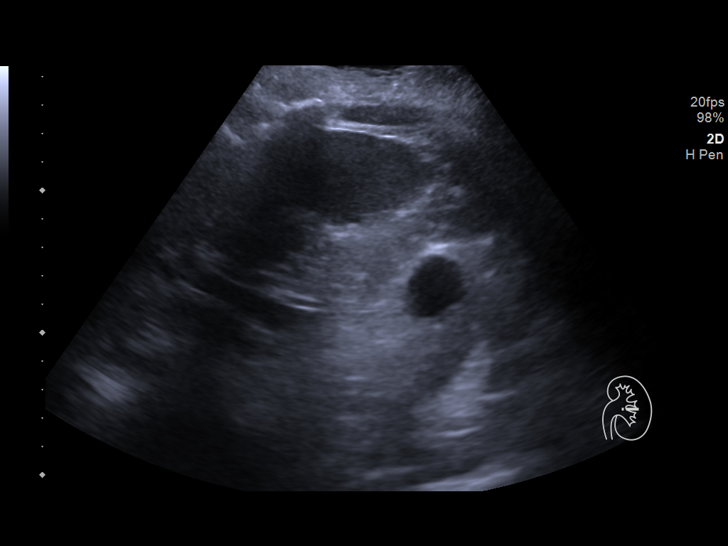
[im 43/52]
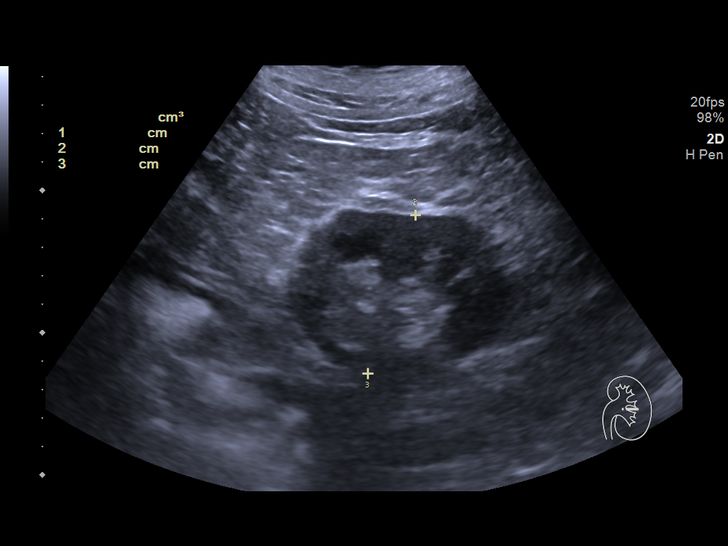
[im 47/52]
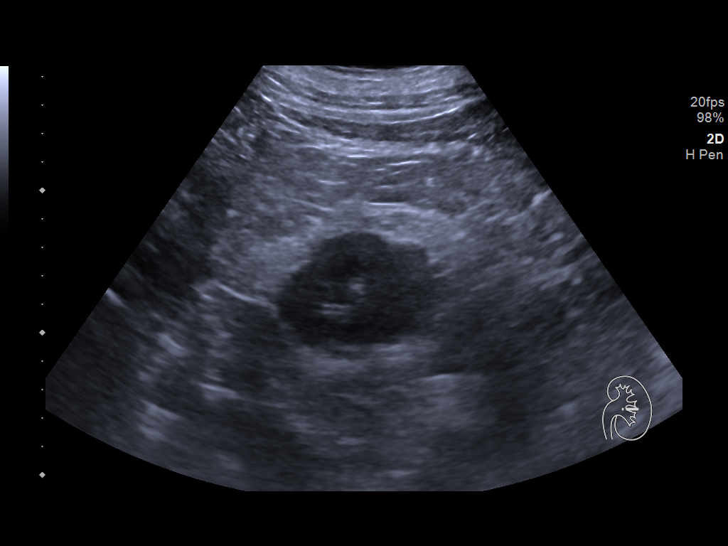
[im 52/52]
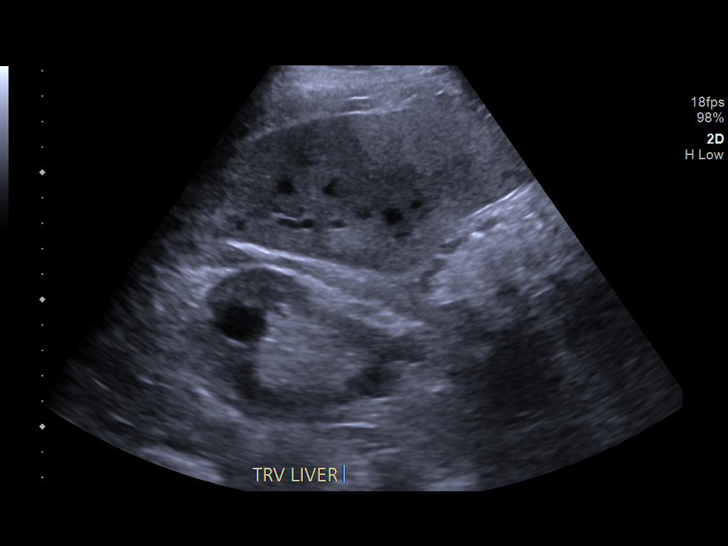

[14 of 25 positions shown; findings below may reference images not displayed]

FINDINGS: Right Kidney:

Renal measurements: 12.4 x 5.1 x 5.5 cm = volume: 181.3 mL. No
hydronephrosis. Increased renal cortical echogenicity. 2.0 x 1.5 x
1.7 cm interpolar renal cyst. 2.5 x 1.4 x 1.3 cm lower pole renal
cyst.

Left Kidney:

Renal measurements: 12.5 x 6.1 x 5.8 cm = volume: 230.9 mL. No
hydronephrosis. Increased renal cortical echogenicity. 3.0 x 2.5 x
1.7 cm upper pole renal cyst. 2.0 x 1.9 x 1.8 cm lower pole renal
cyst.

Bladder:

Appears normal for degree of bladder distention.

Other:

Incidentally noted, there is heterogeneous echotexture of the
partially imaged liver.
IMPRESSION: No hydronephrosis.

Increased renal cortical echogenicity bilaterally suggestive of
chronic renal parenchymal disease.

Bilateral renal cysts.

Incidentally noted heterogeneous echotexture of the partially imaged
liver. Findings are nonspecific, but may be seen in the setting of
hepatic steatosis or other chronic hepatic parenchymal disease.

## 2020-05-27 IMAGING — DX DG CHEST 1V PORT
1 series · 1 of 1 positions shown · non-contrast
Comparison: Chest radiograph 10/21/2019

CLINICAL DATA: Update status covid unit

EXAM:
PORTABLE CHEST 1 VIEW

[chest ap]
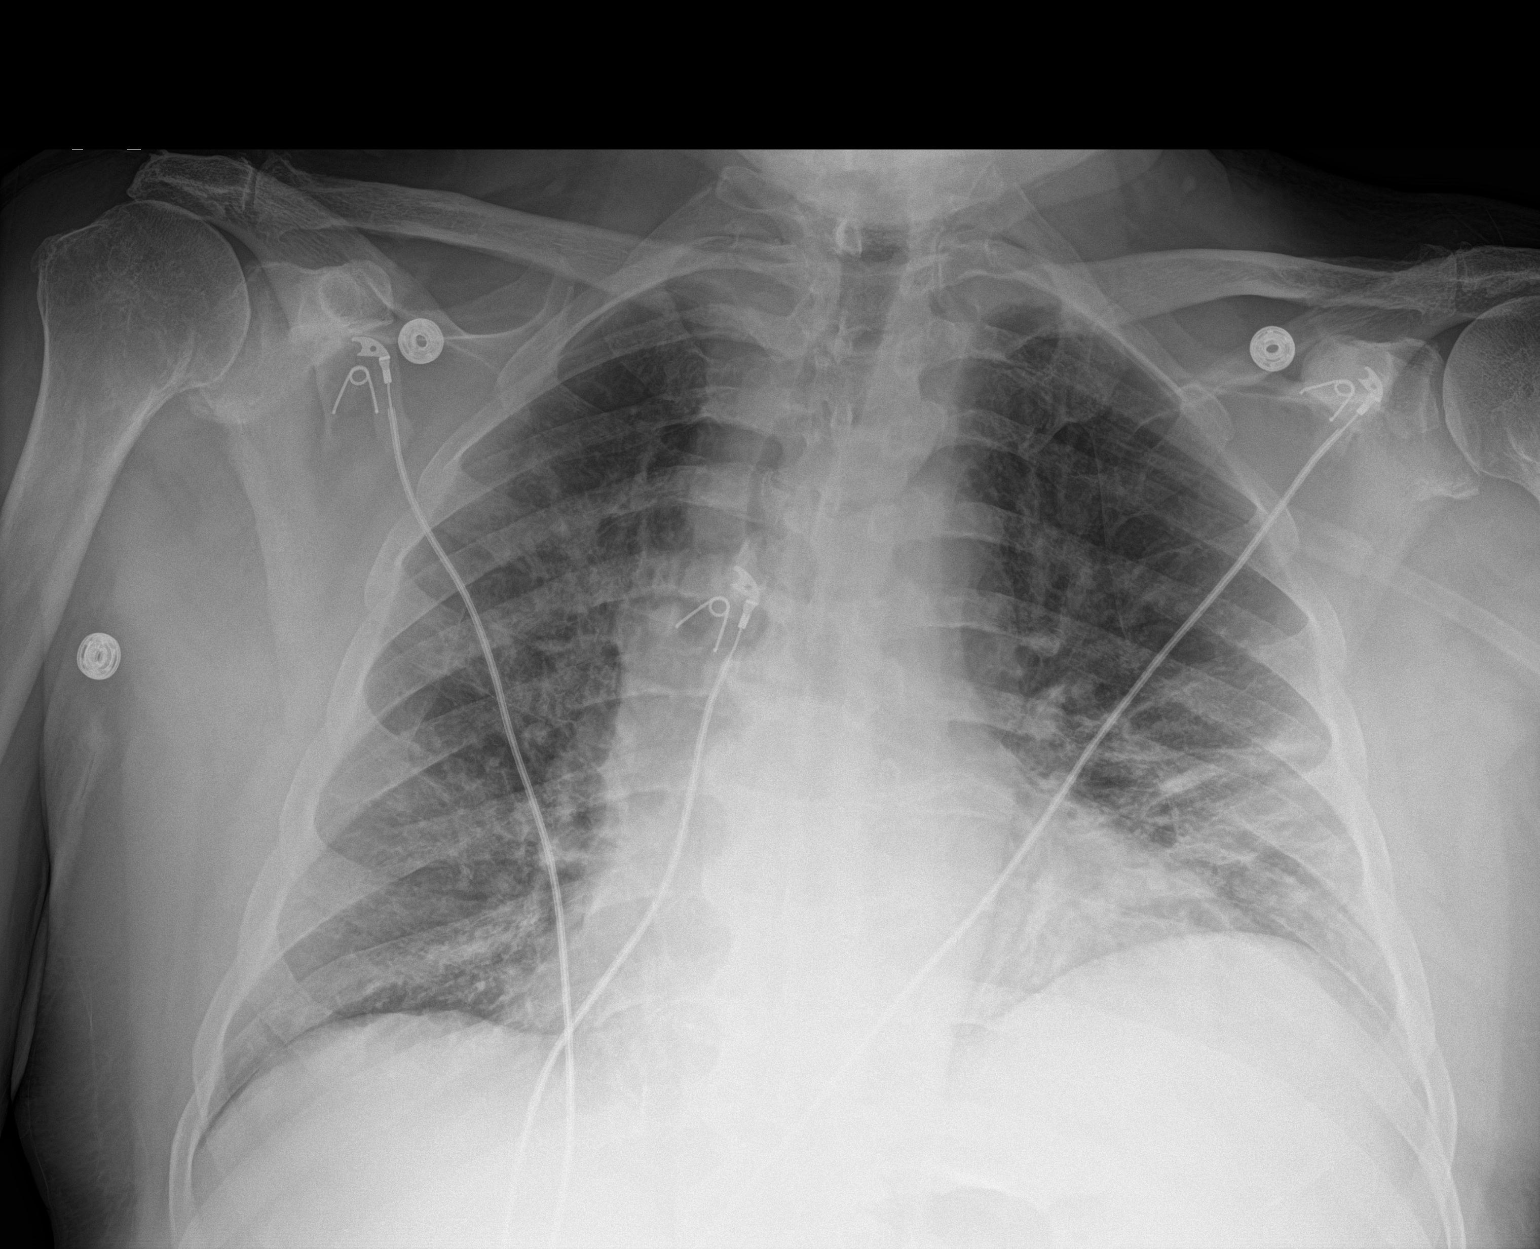

[1 of 1 positions shown; findings below may reference images not displayed]

FINDINGS: Stable cardiomediastinal contours with enlarged heart size.
Persistent streaky infiltrates in the bilateral lung bases, similar
to prior. No new focal infiltrate. No pneumothorax or large pleural
effusion.
IMPRESSION: Bilateral airspace disease not significantly changed from prior.

## 2020-05-31 DIAGNOSIS — M5432 Sciatica, left side: Secondary | ICD-10-CM | POA: Diagnosis not present

## 2020-06-04 DIAGNOSIS — I2510 Atherosclerotic heart disease of native coronary artery without angina pectoris: Secondary | ICD-10-CM | POA: Diagnosis not present

## 2020-06-04 DIAGNOSIS — E7800 Pure hypercholesterolemia, unspecified: Secondary | ICD-10-CM | POA: Diagnosis not present

## 2020-06-04 DIAGNOSIS — C61 Malignant neoplasm of prostate: Secondary | ICD-10-CM | POA: Diagnosis not present

## 2020-06-04 DIAGNOSIS — I10 Essential (primary) hypertension: Secondary | ICD-10-CM | POA: Diagnosis not present

## 2020-06-04 DIAGNOSIS — E1169 Type 2 diabetes mellitus with other specified complication: Secondary | ICD-10-CM | POA: Diagnosis not present

## 2020-06-04 DIAGNOSIS — E782 Mixed hyperlipidemia: Secondary | ICD-10-CM | POA: Diagnosis not present

## 2020-06-04 DIAGNOSIS — N182 Chronic kidney disease, stage 2 (mild): Secondary | ICD-10-CM | POA: Diagnosis not present

## 2020-06-04 DIAGNOSIS — E1122 Type 2 diabetes mellitus with diabetic chronic kidney disease: Secondary | ICD-10-CM | POA: Diagnosis not present

## 2020-06-04 DIAGNOSIS — N189 Chronic kidney disease, unspecified: Secondary | ICD-10-CM | POA: Diagnosis not present

## 2020-06-14 DIAGNOSIS — M5432 Sciatica, left side: Secondary | ICD-10-CM | POA: Diagnosis not present

## 2020-06-29 DIAGNOSIS — I10 Essential (primary) hypertension: Secondary | ICD-10-CM | POA: Diagnosis not present

## 2020-06-29 DIAGNOSIS — E7800 Pure hypercholesterolemia, unspecified: Secondary | ICD-10-CM | POA: Diagnosis not present

## 2020-06-29 DIAGNOSIS — N189 Chronic kidney disease, unspecified: Secondary | ICD-10-CM | POA: Diagnosis not present

## 2020-06-29 DIAGNOSIS — E1122 Type 2 diabetes mellitus with diabetic chronic kidney disease: Secondary | ICD-10-CM | POA: Diagnosis not present

## 2020-06-29 DIAGNOSIS — C61 Malignant neoplasm of prostate: Secondary | ICD-10-CM | POA: Diagnosis not present

## 2020-06-29 DIAGNOSIS — I2510 Atherosclerotic heart disease of native coronary artery without angina pectoris: Secondary | ICD-10-CM | POA: Diagnosis not present

## 2020-06-29 DIAGNOSIS — E1169 Type 2 diabetes mellitus with other specified complication: Secondary | ICD-10-CM | POA: Diagnosis not present

## 2020-06-29 DIAGNOSIS — E782 Mixed hyperlipidemia: Secondary | ICD-10-CM | POA: Diagnosis not present

## 2020-06-29 DIAGNOSIS — N182 Chronic kidney disease, stage 2 (mild): Secondary | ICD-10-CM | POA: Diagnosis not present

## 2020-07-05 ENCOUNTER — Ambulatory Visit
Admission: RE | Admit: 2020-07-05 | Discharge: 2020-07-05 | Disposition: A | Discharge status: Home / Self Care | Payer: Medicare HMO | Source: Ambulatory Visit | Attending: Internal Medicine | Admitting: Internal Medicine

## 2020-07-05 ENCOUNTER — Other Ambulatory Visit: Payer: Self-pay | Admitting: Internal Medicine

## 2020-07-05 DIAGNOSIS — Z7984 Long term (current) use of oral hypoglycemic drugs: Secondary | ICD-10-CM | POA: Diagnosis not present

## 2020-07-05 DIAGNOSIS — E1122 Type 2 diabetes mellitus with diabetic chronic kidney disease: Secondary | ICD-10-CM | POA: Diagnosis not present

## 2020-07-05 DIAGNOSIS — M5430 Sciatica, unspecified side: Secondary | ICD-10-CM | POA: Diagnosis not present

## 2020-07-05 DIAGNOSIS — K5900 Constipation, unspecified: Secondary | ICD-10-CM | POA: Diagnosis not present

## 2020-07-05 DIAGNOSIS — I10 Essential (primary) hypertension: Secondary | ICD-10-CM | POA: Diagnosis not present

## 2020-07-05 DIAGNOSIS — N189 Chronic kidney disease, unspecified: Secondary | ICD-10-CM | POA: Diagnosis not present

## 2020-07-05 DIAGNOSIS — R5383 Other fatigue: Secondary | ICD-10-CM | POA: Diagnosis not present

## 2020-08-02 DIAGNOSIS — I10 Essential (primary) hypertension: Secondary | ICD-10-CM | POA: Diagnosis not present

## 2020-08-02 DIAGNOSIS — E782 Mixed hyperlipidemia: Secondary | ICD-10-CM | POA: Diagnosis not present

## 2020-08-02 DIAGNOSIS — E1122 Type 2 diabetes mellitus with diabetic chronic kidney disease: Secondary | ICD-10-CM | POA: Diagnosis not present

## 2020-08-21 ENCOUNTER — Ambulatory Visit: Payer: Medicare HMO | Attending: Internal Medicine

## 2020-08-21 DIAGNOSIS — Z23 Encounter for immunization: Secondary | ICD-10-CM

## 2020-08-21 NOTE — Progress Notes (Signed)
   Covid-19 Vaccination Clinic  Name:  Gregory [name]    MRN: [id number] DOB: [birth year]  08/21/2020  [name] was observed post Covid-19 immunization for 15 minutes without incident. He was provided with Vaccine Information Sheet and instruction to access the V-Safe system.   [name] was instructed to call 911 with any severe reactions post vaccine: Marland Kitchen Difficulty breathing  . Swelling of face and throat  . A fast heartbeat  . A bad rash all over body  . Dizziness and weakness

## 2020-09-01 DIAGNOSIS — E1122 Type 2 diabetes mellitus with diabetic chronic kidney disease: Secondary | ICD-10-CM | POA: Diagnosis not present

## 2020-09-01 DIAGNOSIS — I10 Essential (primary) hypertension: Secondary | ICD-10-CM | POA: Diagnosis not present

## 2020-09-01 DIAGNOSIS — I2510 Atherosclerotic heart disease of native coronary artery without angina pectoris: Secondary | ICD-10-CM | POA: Diagnosis not present

## 2020-09-01 DIAGNOSIS — C61 Malignant neoplasm of prostate: Secondary | ICD-10-CM | POA: Diagnosis not present

## 2020-09-01 DIAGNOSIS — E7800 Pure hypercholesterolemia, unspecified: Secondary | ICD-10-CM | POA: Diagnosis not present

## 2020-09-01 DIAGNOSIS — E782 Mixed hyperlipidemia: Secondary | ICD-10-CM | POA: Diagnosis not present

## 2020-09-01 DIAGNOSIS — N182 Chronic kidney disease, stage 2 (mild): Secondary | ICD-10-CM | POA: Diagnosis not present

## 2020-09-01 DIAGNOSIS — N189 Chronic kidney disease, unspecified: Secondary | ICD-10-CM | POA: Diagnosis not present

## 2020-09-01 DIAGNOSIS — E1169 Type 2 diabetes mellitus with other specified complication: Secondary | ICD-10-CM | POA: Diagnosis not present

## 2020-09-12 NOTE — Progress Notes (Signed)
Cardiology Office Note   Date:  09/13/2020   ID:  Gregory [name], DOB 1951-06-09, MRN [id number]  PCP:  Seward Carol, MD    No chief complaint on file.  CAD  Wt Readings from Last 3 Encounters:  09/13/20 234 lb (106.1 kg)  11/18/19 216 lb (98 kg)  10/26/19 214 lb 14.4 oz (97.5 kg)       History of Present Illness: Gregory [name] is a 69 y.o. male  who had overlapping stents to the LAD, DES in 8/13.  He was hoispitalized in 4/16 for Pneumonia. He did have a cardiology consult due to a prolonged run of nonsustained ventricular tachycardia which occurred early in the morning. The patient was asymptomatic. He was watched on telemetry and beta blockade was increased. He did not have any further episodes on telemetry. There is no syncope. He was sent home and he has felt well.  He works a as Art gallery manager.  His wife passed away a few years ago.   He had COVID in early Jan 2021 and was hospitalized : "Patient had presented with complaints of feeling unwell. Patient was seen at urgent care and Covid test was positive. Patient noted to be hypoxic with sats in the 80s on room air. Patient noted to have elevated inflammatory markers. Chest x-ray which was done with bilateral infiltrates. Patient wast placed on O2. Procalcitonin was 0.21.Was treated with IV Decadron, remdesivir Mucinex, Flonase, Atrovent and Xopenex MDI.Improving clinically. Patient with sats of 90% on2 L nasal cannula.He will be discharged home on oxygen and Decadron 6 mg twice a day to finish a course of 10 days.   He had ARF with COVID but creatinine came down to 1.48 with hydration and holding ARB.  Patient with a 7 beat run of asymptomatic nonsustained V. tachon 10/24/2019. Potassium at 4.8. Magnesium at 2.1.Lopressor continued."  He lost some weight while sick with COVID.  Since then, he is back to normal.  He has gained weight back.  No longer using oxygen.    Denies : Chest pain.  Dizziness. Leg edema. Nitroglycerin use. Orthopnea. Palpitations. Paroxysmal nocturnal dyspnea. Shortness of breath. Syncope.   He had all of his vaccines, COVID and flu.  No doing much walking.  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  Wants to go back to the gym.      Past Medical History:  Diagnosis Date  . Arthritis   . Cancer ([place])   . Coronary artery disease   . Gout   . Hyperlipidemia   . Hypertension   . Myocardial infarction ([place]) 05/2012   "mild"  . Swelling of left lower extremity    LLE; "happens often"  . Type II diabetes mellitus ([place])     Past Surgical History:  Procedure Laterality Date  . BACK SURGERY    . CORONARY ANGIOPLASTY WITH STENT PLACEMENT  06/05/2012   "2; total of 2"  . LEFT HEART CATHETERIZATION WITH CORONARY ANGIOGRAM N/A 06/05/2012   Procedure: LEFT HEART CATHETERIZATION WITH CORONARY ANGIOGRAM;  Surgeon: Jettie Booze, MD;  Location: [facility] CATH LAB;  Service: Cardiovascular;  Laterality: N/A;  possible PCI  . LUMBAR DISC SURGERY  1990's  . LYMPHADENECTOMY Bilateral 02/25/2014   Procedure: [facility] LYMPH NODE DISSECTION";  Surgeon: Molli Hazard, MD;  Location: [facility] ORS;  Service: Urology;  Laterality: Bilateral;  . PROSTATE SURGERY    . ROBOT ASSISTED LAPAROSCOPIC RADICAL PROSTATECTOMY N/A 02/25/2014   Procedure: ROBOTIC ASSISTED LAPAROSCOPIC RADICAL  PROSTATECTOMY,  UMBILICAL HERNIA REPAIR;  Surgeon: Molli Hazard, MD;  Location: [facility] ORS;  Service: Urology;  Laterality: N/A;     Current Outpatient Medications  Medication Sig Dispense Refill  . allopurinol (ZYLOPRIM) 300 MG tablet Take 300 mg by mouth daily as needed (gout).     Marland Kitchen amLODipine-valsartan (EXFORGE) 10-320 MG per tablet Take 1 tablet by mouth daily.    Marland Kitchen atorvastatin (LIPITOR) 10 MG tablet Take 10 mg by mouth daily at 6 PM.   1  . clopidogrel (PLAVIX) 75 MG tablet TAKE 1 TABLET (75 MG TOTAL) BY MOUTH DAILY. 90 tablet 0  . insulin glargine (LANTUS)  100 UNIT/ML injection Inject 0.3 mLs (30 Units total) into the skin every morning. 10 mL 11  . metFORMIN (GLUCOPHAGE) 1000 MG tablet Take 1,000 mg by mouth 2 (two) times daily with a meal.    . metoprolol tartrate (LOPRESSOR) 100 MG tablet Take 100 mg by mouth 2 (two) times daily.    . nitroGLYCERIN (NITROSTAT) 0.4 MG SL tablet Place 1 tablet (0.4 mg total) under the tongue every 5 (five) minutes as needed for chest pain. 25 tablet 3   No current facility-administered medications for this visit.    Allergies:   Patient has no known allergies.    Social History:  The patient  reports that he quit smoking about 26 years ago. His smoking use included cigarettes. He has a 60.00 pack-year smoking history. He has never used smokeless tobacco. He reports that he does not drink alcohol and does not use drugs.   Family History:  The patient's family history includes Asthma in his father; Breast cancer in his mother; Parkinson's disease in his father.    ROS:  Please see the history of present illness.   Otherwise, review of systems are positive for weight gain.   All other systems are reviewed and negative.    PHYSICAL EXAM: VS:  BP 106/62   Pulse 100   Ht 5\' 7"  (1.702 m)   Wt 234 lb (106.1 kg)   SpO2 93%   BMI 36.65 kg/m  , BMI Body mass index is 36.65 kg/m. GEN: Well nourished, well developed, in no acute distress  HEENT: normal  Neck: no JVD, carotid bruits, or masses Cardiac: RRR; no murmurs, rubs, or gallops,no edema  Respiratory:  clear to auscultation bilaterally, normal work of breathing GI: soft, nontender, nondistended, + BS MS: no deformity or atrophy ; chronic left ankle edema from torn achilles tendon Skin: warm and dry, no rash Neuro:  Strength and sensation are intact Psych: euthymic mood, full affect   EKG:   The ekg ordered Jan 2021 demonstrates NSR, no ST changes   Recent Labs: 10/26/2019: ALT 35; BUN 44; Creatinine, Ser 1.64; Hemoglobin 17.1; Magnesium 2.2;  Platelets 207; Potassium 5.1; Sodium 135   Lipid Panel    Component Value Date/Time   TRIG 136 10/21/2019 1651     Other studies Reviewed: Additional studies/ records that were reviewed today with results demonstrating: LDL 101. Cr 1.76 in 9/21   ASSESSMENT AND PLAN:  1. CAD: Continue aggressive secondary prevention.  He had diffuse distal disease in the past which requires medical management.  Continue secondary prevention.  2. DM: Whole food, plant-based diet recommended.  A1C 7.4 in 5/21. 3. Hyperlipidemia: LDL 101 in 5/21.  WOuld like to see it below 100. May need to increase atorvastatin depending on his lipid results.  4. Hypertension: Low-salt diet.  The current medical regimen is effective;  continue present plan and medications. 5. Chronic renal insufficiency: Avoid nephrotoxins. 6. Weight gain: check TSH.    Current medicines are reviewed at length with the patient today.  The patient concerns regarding his medicines were addressed.  The following changes have been made:  No change  Labs/ tests ordered today include:  No orders of the defined types were placed in this encounter.   Recommend 150 minutes/week of aerobic exercise Low fat, low carb, high fiber diet recommended  Disposition:   FU in 1 year   Signed, Larae Grooms, MD  09/13/2020 9:13 AM    [facility] [place], [place],   [address] Phone: [phone]; Fax: [phone]

## 2020-09-13 ENCOUNTER — Encounter: Payer: Self-pay | Admitting: Interventional Cardiology

## 2020-09-13 ENCOUNTER — Other Ambulatory Visit: Payer: Self-pay

## 2020-09-13 ENCOUNTER — Ambulatory Visit (INDEPENDENT_AMBULATORY_CARE_PROVIDER_SITE_OTHER): Payer: Medicare HMO | Admitting: Interventional Cardiology

## 2020-09-13 VITALS — BP 106/62 | HR 100 | Ht 67.0 in | Wt 234.0 lb

## 2020-09-13 DIAGNOSIS — N1831 Chronic kidney disease, stage 3a: Secondary | ICD-10-CM | POA: Diagnosis not present

## 2020-09-13 DIAGNOSIS — E1159 Type 2 diabetes mellitus with other circulatory complications: Secondary | ICD-10-CM | POA: Diagnosis not present

## 2020-09-13 DIAGNOSIS — E782 Mixed hyperlipidemia: Secondary | ICD-10-CM | POA: Diagnosis not present

## 2020-09-13 DIAGNOSIS — I10 Essential (primary) hypertension: Secondary | ICD-10-CM | POA: Diagnosis not present

## 2020-09-13 DIAGNOSIS — I2510 Atherosclerotic heart disease of native coronary artery without angina pectoris: Secondary | ICD-10-CM

## 2020-09-13 NOTE — Patient Instructions (Signed)
Medication Instructions:  Your physician recommends that you continue on your current medications as directed. Please refer to the Current Medication list given to you today.  *If you need a refill on your cardiac medications before your next appointment, please call your pharmacy*   Lab Work: TODAY: CMET, LIPIDS, A1C, TSH  If you have labs (blood work) drawn today and your tests are completely normal, you will receive your results only by: Marland Kitchen MyChart Message (if you have MyChart) OR . A paper copy in the mail If you have any lab test that is abnormal or we need to change your treatment, we will call you to review the results.   Testing/Procedures: None   Follow-Up: At [facility], you and your health needs are our priority.  As part of our continuing mission to provide you with exceptional heart care, we have created designated Provider Care Teams.  These Care Teams include your primary Cardiologist (physician) and Advanced Practice Providers (APPs -  Physician Assistants and Nurse Practitioners) who all work together to provide you with the care you need, when you need it.  We recommend signing up for the patient portal called "MyChart".  Sign up information is provided on this After Visit Summary.  MyChart is used to connect with patients for Virtual Visits (Telemedicine).  Patients are able to view lab/test results, encounter notes, upcoming appointments, etc.  Non-urgent messages can be sent to your provider as well.   To learn more about what you can do with MyChart, go to [email or web address].    Your next appointment:   6 month(s)  The format for your next appointment:   In Person  Provider:   You may see Larae Grooms, MD or one of the following Advanced Practice Providers on your designated Care Team:    Melina Copa, PA-C  Ermalinda Barrios, PA-C    Other Instructions  High-Fiber Diet Fiber, also called dietary fiber, is a type of carbohydrate that is found in  fruits, vegetables, whole grains, and beans. A high-fiber diet can have many health benefits. Your health care provider may recommend a high-fiber diet to help:  Prevent constipation. Fiber can make your bowel movements more regular.  Lower your cholesterol.  Relieve the following conditions: ? Swelling of veins in the anus (hemorrhoids). ? Swelling and irritation (inflammation) of specific areas of the digestive tract (uncomplicated diverticulosis). ? A problem of the large intestine (colon) that sometimes causes pain and diarrhea (irritable bowel syndrome, IBS).  Prevent overeating as part of a weight-loss plan.  Prevent heart disease, type 2 diabetes, and certain cancers. What is my plan? The recommended daily fiber intake in grams (g) includes:  38 g for men age 42 or younger.  30 g for men over age 56.  32 g for women age 87 or younger.  21 g for women over age 33. You can get the recommended daily intake of dietary fiber by:  Eating a variety of fruits, vegetables, grains, and beans.  Taking a fiber supplement, if it is not possible to get enough fiber through your diet. What do I need to know about a high-fiber diet?  It is better to get fiber through food sources rather than from fiber supplements. There is not a lot of research about how effective supplements are.  Always check the fiber content on the nutrition facts label of any prepackaged food. Look for foods that contain 5 g of fiber or more per serving.  Talk with a diet and  nutrition specialist (dietitian) if you have questions about specific foods that are recommended or not recommended for your medical condition, especially if those foods are not listed below.  Gradually increase how much fiber you consume. If you increase your intake of dietary fiber too quickly, you may have bloating, cramping, or gas.  Drink plenty of water. Water helps you to digest fiber. What are tips for following this plan?  Eat a  wide variety of high-fiber foods.  Make sure that half of the grains that you eat each day are whole grains.  Eat breads and cereals that are made with whole-grain flour instead of refined flour or white flour.  Eat brown rice, bulgur wheat, or millet instead of white rice.  Start the day with a breakfast that is high in fiber, such as a cereal that contains 5 g of fiber or more per serving.  Use beans in place of meat in soups, salads, and pasta dishes.  Eat high-fiber snacks, such as berries, raw vegetables, nuts, and popcorn.  Choose whole fruits and vegetables instead of processed forms like juice or sauce. What foods can I eat?  Fruits Berries. Pears. Apples. Oranges. Avocado. Prunes and raisins. Dried figs. Vegetables Sweet potatoes. Spinach. Kale. Artichokes. Cabbage. Broccoli. Cauliflower. Green peas. Carrots. Squash. Grains Whole-grain breads. Multigrain cereal. Oats and oatmeal. Brown rice. Barley. Bulgur wheat. [place]. Quinoa. Bran muffins. Popcorn. Rye wafer crackers. Meats and other proteins Navy, kidney, and pinto beans. Soybeans. Split peas. Lentils. Nuts and seeds. Dairy Fiber-fortified yogurt. Beverages Fiber-fortified soy milk. Fiber-fortified orange juice. Other foods Fiber bars. The items listed above may not be a complete list of recommended foods and beverages. Contact a dietitian for more options. What foods are not recommended? Fruits Fruit juice. Cooked, strained fruit. Vegetables Fried potatoes. Canned vegetables. Well-cooked vegetables. Grains White bread. Pasta made with refined flour. White rice. Meats and other proteins Fatty cuts of meat. Fried chicken or fried fish. Dairy Milk. Yogurt. Cream cheese. Sour cream. Fats and oils Butters. Beverages Soft drinks. Other foods Cakes and pastries. The items listed above may not be a complete list of foods and beverages to avoid. Contact a dietitian for more information. Summary  Fiber is a  type of carbohydrate. It is found in fruits, vegetables, whole grains, and beans.  There are many health benefits of eating a high-fiber diet, such as preventing constipation, lowering blood cholesterol, helping with weight loss, and reducing your risk of heart disease, diabetes, and certain cancers.  Gradually increase your intake of fiber. Increasing too fast can result in cramping, bloating, and gas. Drink plenty of water while you increase your fiber.  The best sources of fiber include whole fruits and vegetables, whole grains, nuts, seeds, and beans. This information is not intended to replace advice given to you by your health care provider. Make sure you discuss any questions you have with your health care provider. Document Revised: 08/13/2017 Document Reviewed: 08/13/2017 Elsevier Patient Education  2020 [employer].

## 2020-09-14 LAB — COMPREHENSIVE METABOLIC PANEL WITH GFR
ALT: 11 IU/L (ref 0–44)
AST: 14 IU/L (ref 0–40)
Albumin/Globulin Ratio: 1.9 (ref 1.2–2.2)
Albumin: 4.2 g/dL (ref 3.8–4.8)
Alkaline Phosphatase: 77 IU/L (ref 44–121)
BUN/Creatinine Ratio: 21 (ref 10–24)
BUN: 28 mg/dL — ABNORMAL HIGH (ref 8–27)
Bilirubin Total: 0.3 mg/dL (ref 0.0–1.2)
CO2: 25 mmol/L (ref 20–29)
Calcium: 9.3 mg/dL (ref 8.6–10.2)
Chloride: 104 mmol/L (ref 96–106)
Creatinine, Ser: 1.31 mg/dL — ABNORMAL HIGH (ref 0.76–1.27)
GFR calc Af Amer: 63 mL/min/1.73
GFR calc non Af Amer: 55 mL/min/1.73 — ABNORMAL LOW
Globulin, Total: 2.2 g/dL (ref 1.5–4.5)
Glucose: 131 mg/dL — ABNORMAL HIGH (ref 65–99)
Potassium: 4.8 mmol/L (ref 3.5–5.2)
Sodium: 141 mmol/L (ref 134–144)
Total Protein: 6.4 g/dL (ref 6.0–8.5)

## 2020-09-14 LAB — LIPID PANEL
Chol/HDL Ratio: 5.3 ratio — ABNORMAL HIGH (ref 0.0–5.0)
Cholesterol, Total: 176 mg/dL (ref 100–199)
HDL: 33 mg/dL — ABNORMAL LOW (ref >39)
LDL Chol Calc (NIH): 97 mg/dL (ref 0–99)
Triglycerides: 268 mg/dL — ABNORMAL HIGH (ref 0–149)
VLDL Cholesterol Cal: 46 mg/dL — ABNORMAL HIGH (ref 5–40)

## 2020-09-14 LAB — HEMOGLOBIN A1C
Est. average glucose Bld gHb Est-mCnc: 154 mg/dL
Hgb A1c MFr Bld: 7 % — ABNORMAL HIGH (ref 4.8–5.6)

## 2020-09-14 LAB — TSH: TSH: 2.05 u[IU]/mL (ref 0.450–4.500)

## 2020-09-20 DIAGNOSIS — Z23 Encounter for immunization: Secondary | ICD-10-CM | POA: Diagnosis not present

## 2020-09-20 DIAGNOSIS — I2510 Atherosclerotic heart disease of native coronary artery without angina pectoris: Secondary | ICD-10-CM | POA: Diagnosis not present

## 2020-09-20 DIAGNOSIS — E7800 Pure hypercholesterolemia, unspecified: Secondary | ICD-10-CM | POA: Diagnosis not present

## 2020-09-20 DIAGNOSIS — M25559 Pain in unspecified hip: Secondary | ICD-10-CM | POA: Diagnosis not present

## 2020-09-20 DIAGNOSIS — E1169 Type 2 diabetes mellitus with other specified complication: Secondary | ICD-10-CM | POA: Diagnosis not present

## 2020-09-20 DIAGNOSIS — I10 Essential (primary) hypertension: Secondary | ICD-10-CM | POA: Diagnosis not present

## 2020-10-01 DIAGNOSIS — N182 Chronic kidney disease, stage 2 (mild): Secondary | ICD-10-CM | POA: Diagnosis not present

## 2020-10-01 DIAGNOSIS — E7800 Pure hypercholesterolemia, unspecified: Secondary | ICD-10-CM | POA: Diagnosis not present

## 2020-10-01 DIAGNOSIS — E1122 Type 2 diabetes mellitus with diabetic chronic kidney disease: Secondary | ICD-10-CM | POA: Diagnosis not present

## 2020-10-01 DIAGNOSIS — E1169 Type 2 diabetes mellitus with other specified complication: Secondary | ICD-10-CM | POA: Diagnosis not present

## 2020-10-01 DIAGNOSIS — I10 Essential (primary) hypertension: Secondary | ICD-10-CM | POA: Diagnosis not present

## 2020-10-01 DIAGNOSIS — C61 Malignant neoplasm of prostate: Secondary | ICD-10-CM | POA: Diagnosis not present

## 2020-10-01 DIAGNOSIS — I2510 Atherosclerotic heart disease of native coronary artery without angina pectoris: Secondary | ICD-10-CM | POA: Diagnosis not present

## 2020-10-01 DIAGNOSIS — E782 Mixed hyperlipidemia: Secondary | ICD-10-CM | POA: Diagnosis not present

## 2020-11-01 ENCOUNTER — Other Ambulatory Visit: Payer: Self-pay | Admitting: Interventional Cardiology

## 2020-11-01 DIAGNOSIS — E1169 Type 2 diabetes mellitus with other specified complication: Secondary | ICD-10-CM | POA: Diagnosis not present

## 2020-11-01 DIAGNOSIS — I2510 Atherosclerotic heart disease of native coronary artery without angina pectoris: Secondary | ICD-10-CM | POA: Diagnosis not present

## 2020-11-01 DIAGNOSIS — E1122 Type 2 diabetes mellitus with diabetic chronic kidney disease: Secondary | ICD-10-CM | POA: Diagnosis not present

## 2020-11-01 DIAGNOSIS — I10 Essential (primary) hypertension: Secondary | ICD-10-CM | POA: Diagnosis not present

## 2020-11-01 DIAGNOSIS — N189 Chronic kidney disease, unspecified: Secondary | ICD-10-CM | POA: Diagnosis not present

## 2020-11-01 DIAGNOSIS — C61 Malignant neoplasm of prostate: Secondary | ICD-10-CM | POA: Diagnosis not present

## 2020-11-01 DIAGNOSIS — N182 Chronic kidney disease, stage 2 (mild): Secondary | ICD-10-CM | POA: Diagnosis not present

## 2020-11-01 DIAGNOSIS — E782 Mixed hyperlipidemia: Secondary | ICD-10-CM | POA: Diagnosis not present

## 2020-11-01 DIAGNOSIS — E7800 Pure hypercholesterolemia, unspecified: Secondary | ICD-10-CM | POA: Diagnosis not present

## 2020-11-15 DIAGNOSIS — N5201 Erectile dysfunction due to arterial insufficiency: Secondary | ICD-10-CM | POA: Diagnosis not present

## 2020-11-15 DIAGNOSIS — C61 Malignant neoplasm of prostate: Secondary | ICD-10-CM | POA: Diagnosis not present

## 2020-11-15 DIAGNOSIS — C775 Secondary and unspecified malignant neoplasm of intrapelvic lymph nodes: Secondary | ICD-10-CM | POA: Diagnosis not present

## 2020-11-15 DIAGNOSIS — N3946 Mixed incontinence: Secondary | ICD-10-CM | POA: Diagnosis not present

## 2020-11-15 DIAGNOSIS — R8279 Other abnormal findings on microbiological examination of urine: Secondary | ICD-10-CM | POA: Diagnosis not present

## 2020-12-01 DIAGNOSIS — N182 Chronic kidney disease, stage 2 (mild): Secondary | ICD-10-CM | POA: Diagnosis not present

## 2020-12-01 DIAGNOSIS — I10 Essential (primary) hypertension: Secondary | ICD-10-CM | POA: Diagnosis not present

## 2020-12-01 DIAGNOSIS — E7800 Pure hypercholesterolemia, unspecified: Secondary | ICD-10-CM | POA: Diagnosis not present

## 2020-12-01 DIAGNOSIS — E1122 Type 2 diabetes mellitus with diabetic chronic kidney disease: Secondary | ICD-10-CM | POA: Diagnosis not present

## 2020-12-01 DIAGNOSIS — I2510 Atherosclerotic heart disease of native coronary artery without angina pectoris: Secondary | ICD-10-CM | POA: Diagnosis not present

## 2020-12-01 DIAGNOSIS — E782 Mixed hyperlipidemia: Secondary | ICD-10-CM | POA: Diagnosis not present

## 2020-12-01 DIAGNOSIS — E1169 Type 2 diabetes mellitus with other specified complication: Secondary | ICD-10-CM | POA: Diagnosis not present

## 2020-12-01 DIAGNOSIS — N189 Chronic kidney disease, unspecified: Secondary | ICD-10-CM | POA: Diagnosis not present

## 2020-12-01 DIAGNOSIS — C61 Malignant neoplasm of prostate: Secondary | ICD-10-CM | POA: Diagnosis not present

## 2020-12-21 ENCOUNTER — Telehealth: Payer: Self-pay

## 2020-12-21 NOTE — Telephone Encounter (Signed)
[facility] called regarding pt's meds. Pt reported in Nov, 2021 that he was no longer taking Furosemide 40mg . Pharmacy said pt called today asking for refill of his furosemide 40mg . Does Dr. Irish Lack still want this pt to take this medication? Please advise.   Also, pt's pcp, Dr. Delfina Redwood, prescribed Metoprolol tartrate 25mg  for pt. He is to take  .5mg  tab in the AM, .5mg  in the PM. Please update in his chart. Thanks

## 2020-12-22 DIAGNOSIS — I10 Essential (primary) hypertension: Secondary | ICD-10-CM | POA: Diagnosis not present

## 2020-12-22 DIAGNOSIS — E782 Mixed hyperlipidemia: Secondary | ICD-10-CM | POA: Diagnosis not present

## 2020-12-22 DIAGNOSIS — E1169 Type 2 diabetes mellitus with other specified complication: Secondary | ICD-10-CM | POA: Diagnosis not present

## 2020-12-22 DIAGNOSIS — N182 Chronic kidney disease, stage 2 (mild): Secondary | ICD-10-CM | POA: Diagnosis not present

## 2020-12-22 DIAGNOSIS — E7800 Pure hypercholesterolemia, unspecified: Secondary | ICD-10-CM | POA: Diagnosis not present

## 2020-12-22 DIAGNOSIS — C61 Malignant neoplasm of prostate: Secondary | ICD-10-CM | POA: Diagnosis not present

## 2020-12-22 DIAGNOSIS — I2510 Atherosclerotic heart disease of native coronary artery without angina pectoris: Secondary | ICD-10-CM | POA: Diagnosis not present

## 2020-12-22 DIAGNOSIS — N189 Chronic kidney disease, unspecified: Secondary | ICD-10-CM | POA: Diagnosis not present

## 2020-12-22 DIAGNOSIS — E1122 Type 2 diabetes mellitus with diabetic chronic kidney disease: Secondary | ICD-10-CM | POA: Diagnosis not present

## 2020-12-22 NOTE — Addendum Note (Signed)
Addended by: Thompson Grayer on: 12/22/2020 12:51 PM   Modules accepted: Orders

## 2020-12-22 NOTE — Telephone Encounter (Signed)
Did we write for the Lasix?

## 2020-12-22 NOTE — Telephone Encounter (Signed)
I spoke with [facility].  They have been filling Lasix every month.  Will forward to Dr Irish Lack to see if patient should continue. Chart updated with current metoprolol tartrate dose.

## 2020-12-23 MED ORDER — FUROSEMIDE 40 MG PO TABS: 40.0000 mg | ORAL_TABLET | Freq: Every day | ORAL | 3 refills | Status: DC | PRN

## 2020-12-23 NOTE — Telephone Encounter (Signed)
OK to continue Lasix 40 mg daily as needed for LE edema.

## 2020-12-23 NOTE — Addendum Note (Signed)
Addended by: Thompson Grayer on: 12/23/2020 10:17 AM   Modules accepted: Orders

## 2020-12-23 NOTE — Telephone Encounter (Signed)
Prescription sent to [facility]

## 2020-12-23 NOTE — Telephone Encounter (Signed)
Yes,  It was started at 08/21/17 office visit due to LE edema

## 2020-12-27 DIAGNOSIS — C61 Malignant neoplasm of prostate: Secondary | ICD-10-CM | POA: Diagnosis not present

## 2020-12-27 DIAGNOSIS — N3946 Mixed incontinence: Secondary | ICD-10-CM | POA: Diagnosis not present

## 2020-12-27 DIAGNOSIS — N5201 Erectile dysfunction due to arterial insufficiency: Secondary | ICD-10-CM | POA: Diagnosis not present

## 2020-12-27 DIAGNOSIS — C775 Secondary and unspecified malignant neoplasm of intrapelvic lymph nodes: Secondary | ICD-10-CM | POA: Diagnosis not present

## 2021-01-24 DIAGNOSIS — R42 Dizziness and giddiness: Secondary | ICD-10-CM | POA: Diagnosis not present

## 2021-01-31 DIAGNOSIS — I2510 Atherosclerotic heart disease of native coronary artery without angina pectoris: Secondary | ICD-10-CM | POA: Diagnosis not present

## 2021-01-31 DIAGNOSIS — E1169 Type 2 diabetes mellitus with other specified complication: Secondary | ICD-10-CM | POA: Diagnosis not present

## 2021-01-31 DIAGNOSIS — E1122 Type 2 diabetes mellitus with diabetic chronic kidney disease: Secondary | ICD-10-CM | POA: Diagnosis not present

## 2021-01-31 DIAGNOSIS — E782 Mixed hyperlipidemia: Secondary | ICD-10-CM | POA: Diagnosis not present

## 2021-01-31 DIAGNOSIS — N182 Chronic kidney disease, stage 2 (mild): Secondary | ICD-10-CM | POA: Diagnosis not present

## 2021-01-31 DIAGNOSIS — I10 Essential (primary) hypertension: Secondary | ICD-10-CM | POA: Diagnosis not present

## 2021-01-31 DIAGNOSIS — E7800 Pure hypercholesterolemia, unspecified: Secondary | ICD-10-CM | POA: Diagnosis not present

## 2021-02-14 DIAGNOSIS — N3946 Mixed incontinence: Secondary | ICD-10-CM | POA: Diagnosis not present

## 2021-02-14 DIAGNOSIS — C775 Secondary and unspecified malignant neoplasm of intrapelvic lymph nodes: Secondary | ICD-10-CM | POA: Diagnosis not present

## 2021-02-14 DIAGNOSIS — N5201 Erectile dysfunction due to arterial insufficiency: Secondary | ICD-10-CM | POA: Diagnosis not present

## 2021-02-14 DIAGNOSIS — E119 Type 2 diabetes mellitus without complications: Secondary | ICD-10-CM | POA: Diagnosis not present

## 2021-02-14 DIAGNOSIS — C61 Malignant neoplasm of prostate: Secondary | ICD-10-CM | POA: Diagnosis not present

## 2021-02-21 DIAGNOSIS — N189 Chronic kidney disease, unspecified: Secondary | ICD-10-CM | POA: Diagnosis not present

## 2021-02-21 DIAGNOSIS — N182 Chronic kidney disease, stage 2 (mild): Secondary | ICD-10-CM | POA: Diagnosis not present

## 2021-02-21 DIAGNOSIS — E7800 Pure hypercholesterolemia, unspecified: Secondary | ICD-10-CM | POA: Diagnosis not present

## 2021-02-21 DIAGNOSIS — C61 Malignant neoplasm of prostate: Secondary | ICD-10-CM | POA: Diagnosis not present

## 2021-02-21 DIAGNOSIS — E782 Mixed hyperlipidemia: Secondary | ICD-10-CM | POA: Diagnosis not present

## 2021-02-21 DIAGNOSIS — I2510 Atherosclerotic heart disease of native coronary artery without angina pectoris: Secondary | ICD-10-CM | POA: Diagnosis not present

## 2021-02-21 DIAGNOSIS — I10 Essential (primary) hypertension: Secondary | ICD-10-CM | POA: Diagnosis not present

## 2021-02-21 DIAGNOSIS — E1169 Type 2 diabetes mellitus with other specified complication: Secondary | ICD-10-CM | POA: Diagnosis not present

## 2021-02-21 DIAGNOSIS — E1122 Type 2 diabetes mellitus with diabetic chronic kidney disease: Secondary | ICD-10-CM | POA: Diagnosis not present

## 2021-03-28 DIAGNOSIS — I2510 Atherosclerotic heart disease of native coronary artery without angina pectoris: Secondary | ICD-10-CM | POA: Diagnosis not present

## 2021-03-28 DIAGNOSIS — N1831 Chronic kidney disease, stage 3a: Secondary | ICD-10-CM | POA: Diagnosis not present

## 2021-03-28 DIAGNOSIS — R0989 Other specified symptoms and signs involving the circulatory and respiratory systems: Secondary | ICD-10-CM | POA: Diagnosis not present

## 2021-03-28 DIAGNOSIS — E1122 Type 2 diabetes mellitus with diabetic chronic kidney disease: Secondary | ICD-10-CM | POA: Diagnosis not present

## 2021-03-28 DIAGNOSIS — Z23 Encounter for immunization: Secondary | ICD-10-CM | POA: Diagnosis not present

## 2021-03-28 DIAGNOSIS — C61 Malignant neoplasm of prostate: Secondary | ICD-10-CM | POA: Diagnosis not present

## 2021-03-28 DIAGNOSIS — E7800 Pure hypercholesterolemia, unspecified: Secondary | ICD-10-CM | POA: Diagnosis not present

## 2021-03-28 DIAGNOSIS — Z0000 Encounter for general adult medical examination without abnormal findings: Secondary | ICD-10-CM | POA: Diagnosis not present

## 2021-03-28 DIAGNOSIS — Z1389 Encounter for screening for other disorder: Secondary | ICD-10-CM | POA: Diagnosis not present

## 2021-03-28 DIAGNOSIS — I10 Essential (primary) hypertension: Secondary | ICD-10-CM | POA: Diagnosis not present

## 2021-03-29 ENCOUNTER — Other Ambulatory Visit: Payer: Self-pay | Admitting: Internal Medicine

## 2021-03-29 DIAGNOSIS — R0989 Other specified symptoms and signs involving the circulatory and respiratory systems: Secondary | ICD-10-CM

## 2021-04-11 DIAGNOSIS — D649 Anemia, unspecified: Secondary | ICD-10-CM | POA: Diagnosis not present

## 2021-04-18 ENCOUNTER — Other Ambulatory Visit: Payer: Medicare HMO

## 2021-04-21 DIAGNOSIS — D649 Anemia, unspecified: Secondary | ICD-10-CM | POA: Diagnosis not present

## 2021-04-21 DIAGNOSIS — E1169 Type 2 diabetes mellitus with other specified complication: Secondary | ICD-10-CM | POA: Diagnosis not present

## 2021-04-21 DIAGNOSIS — E1122 Type 2 diabetes mellitus with diabetic chronic kidney disease: Secondary | ICD-10-CM | POA: Diagnosis not present

## 2021-04-21 DIAGNOSIS — E7800 Pure hypercholesterolemia, unspecified: Secondary | ICD-10-CM | POA: Diagnosis not present

## 2021-04-21 DIAGNOSIS — I2510 Atherosclerotic heart disease of native coronary artery without angina pectoris: Secondary | ICD-10-CM | POA: Diagnosis not present

## 2021-04-21 DIAGNOSIS — E782 Mixed hyperlipidemia: Secondary | ICD-10-CM | POA: Diagnosis not present

## 2021-04-21 DIAGNOSIS — N1831 Chronic kidney disease, stage 3a: Secondary | ICD-10-CM | POA: Diagnosis not present

## 2021-04-21 DIAGNOSIS — I10 Essential (primary) hypertension: Secondary | ICD-10-CM | POA: Diagnosis not present

## 2021-05-02 ENCOUNTER — Ambulatory Visit
Admission: RE | Admit: 2021-05-02 | Discharge: 2021-05-02 | Disposition: A | Discharge status: Home / Self Care | Payer: Medicare HMO | Source: Ambulatory Visit | Attending: Internal Medicine | Admitting: Internal Medicine

## 2021-05-02 DIAGNOSIS — R0989 Other specified symptoms and signs involving the circulatory and respiratory systems: Secondary | ICD-10-CM

## 2021-05-02 DIAGNOSIS — D649 Anemia, unspecified: Secondary | ICD-10-CM | POA: Diagnosis not present

## 2021-05-19 DIAGNOSIS — I2510 Atherosclerotic heart disease of native coronary artery without angina pectoris: Secondary | ICD-10-CM | POA: Diagnosis not present

## 2021-05-19 DIAGNOSIS — E1122 Type 2 diabetes mellitus with diabetic chronic kidney disease: Secondary | ICD-10-CM | POA: Diagnosis not present

## 2021-05-19 DIAGNOSIS — N1831 Chronic kidney disease, stage 3a: Secondary | ICD-10-CM | POA: Diagnosis not present

## 2021-05-19 DIAGNOSIS — E1169 Type 2 diabetes mellitus with other specified complication: Secondary | ICD-10-CM | POA: Diagnosis not present

## 2021-05-19 DIAGNOSIS — N182 Chronic kidney disease, stage 2 (mild): Secondary | ICD-10-CM | POA: Diagnosis not present

## 2021-05-19 DIAGNOSIS — E782 Mixed hyperlipidemia: Secondary | ICD-10-CM | POA: Diagnosis not present

## 2021-05-19 DIAGNOSIS — E7800 Pure hypercholesterolemia, unspecified: Secondary | ICD-10-CM | POA: Diagnosis not present

## 2021-05-19 DIAGNOSIS — I10 Essential (primary) hypertension: Secondary | ICD-10-CM | POA: Diagnosis not present

## 2021-05-30 DIAGNOSIS — Z86010 Personal history of colonic polyps: Secondary | ICD-10-CM | POA: Diagnosis not present

## 2021-05-30 DIAGNOSIS — E119 Type 2 diabetes mellitus without complications: Secondary | ICD-10-CM | POA: Diagnosis not present

## 2021-05-30 DIAGNOSIS — R195 Other fecal abnormalities: Secondary | ICD-10-CM | POA: Diagnosis not present

## 2021-05-30 DIAGNOSIS — I2510 Atherosclerotic heart disease of native coronary artery without angina pectoris: Secondary | ICD-10-CM | POA: Diagnosis not present

## 2021-06-01 ENCOUNTER — Telehealth: Payer: Self-pay

## 2021-06-01 NOTE — Telephone Encounter (Signed)
   [facility] Pre-operative Risk Assessment    Patient Name: Gregory [name]  DOB: 08/09/1951 MRN: [id number]  Request for surgical clearance:  What type of surgery is being performed? Colonoscopy  When is this surgery scheduled? 06/21/2021  What type of clearance is required (medical clearance vs. Pharmacy clearance to hold med vs. Both)? Both  Are there any medications that need to be held prior to surgery and how long? Plavix  Practice name and name of physician performing surgery? [facility], [place] - Carol Ada, MD  What is the office phone number? [phone]   7.   What is the office fax number? [phone]  8.   Anesthesia type (None, local, MAC, general) ? Propofol   Gregory [name] 06/01/2021, 5:00 PM  _________________________________________________________________   (provider comments below)

## 2021-06-02 NOTE — Telephone Encounter (Signed)
   Name: Gregory [name]  DOB: 11/26/1950  MRN: [id number]   Primary Cardiologist: Larae Grooms, MD  Chart reviewed as part of pre-operative protocol coverage. Patient was contacted 06/02/2021 in reference to pre-operative risk assessment for pending surgery as outlined below.  YOSHIO BRADLE was last seen on 08/2020 by Dr. Irish Lack with pertinent cardiac history to include CAD s/p stenting to LAD 2013, VT, HTN, HLD, DM, lower extremity edema, CKD. I reached out to patient for update on how he is doing. The patient affirms he has been doing well without any new cardiac symptoms.  Therefore, based on ACC/AHA guidelines, the patient would be at acceptable risk for the planned procedure without further cardiovascular testing.   I'll reach out to Dr. Irish Lack to find out if he would be OK with patient holding Plavix prior to colonoscopy (he is on Plavix monotherapy). Dr. Irish Lack - Please route response to P [facility] PREOP (the pre-op pool). Thank you.  Charlie Pitter, PA-C 06/02/2021, 12:04 PM

## 2021-06-02 NOTE — Telephone Encounter (Signed)
OK to hold Plavix 5 days prior to colonoscopy.  

## 2021-06-02 NOTE — Telephone Encounter (Signed)
   Patient Name: Gregory [name]  DOB: 26-Feb-1951 MRN: [id number]  Primary Cardiologist: Larae Grooms, MD  Chart revisited as part of pre-operative protocol coverage.  To summarize, Therefore, Gregory [name] would be at acceptable risk for the planned procedure without further cardiovascular testing. Per Dr. Irish Lack, [place] to hold Plavix 5 days prior to colonoscopy.  Will route this bundled recommendation to requesting provider via Epic fax function. Please call with questions.  Charlie Pitter, PA-C 06/02/2021, 12:34 PM

## 2021-06-20 DIAGNOSIS — N1831 Chronic kidney disease, stage 3a: Secondary | ICD-10-CM | POA: Diagnosis not present

## 2021-06-20 DIAGNOSIS — E1122 Type 2 diabetes mellitus with diabetic chronic kidney disease: Secondary | ICD-10-CM | POA: Diagnosis not present

## 2021-06-20 DIAGNOSIS — E782 Mixed hyperlipidemia: Secondary | ICD-10-CM | POA: Diagnosis not present

## 2021-06-20 DIAGNOSIS — I2510 Atherosclerotic heart disease of native coronary artery without angina pectoris: Secondary | ICD-10-CM | POA: Diagnosis not present

## 2021-06-20 DIAGNOSIS — E1169 Type 2 diabetes mellitus with other specified complication: Secondary | ICD-10-CM | POA: Diagnosis not present

## 2021-06-20 DIAGNOSIS — I10 Essential (primary) hypertension: Secondary | ICD-10-CM | POA: Diagnosis not present

## 2021-06-20 DIAGNOSIS — E7800 Pure hypercholesterolemia, unspecified: Secondary | ICD-10-CM | POA: Diagnosis not present

## 2021-06-21 DIAGNOSIS — D124 Benign neoplasm of descending colon: Secondary | ICD-10-CM | POA: Diagnosis not present

## 2021-06-21 DIAGNOSIS — K5520 Angiodysplasia of colon without hemorrhage: Secondary | ICD-10-CM | POA: Diagnosis not present

## 2021-06-21 DIAGNOSIS — K635 Polyp of colon: Secondary | ICD-10-CM | POA: Diagnosis not present

## 2021-06-21 DIAGNOSIS — R195 Other fecal abnormalities: Secondary | ICD-10-CM | POA: Diagnosis not present

## 2021-06-21 DIAGNOSIS — K5730 Diverticulosis of large intestine without perforation or abscess without bleeding: Secondary | ICD-10-CM | POA: Diagnosis not present

## 2021-07-19 DIAGNOSIS — E1122 Type 2 diabetes mellitus with diabetic chronic kidney disease: Secondary | ICD-10-CM | POA: Diagnosis not present

## 2021-07-19 DIAGNOSIS — N1831 Chronic kidney disease, stage 3a: Secondary | ICD-10-CM | POA: Diagnosis not present

## 2021-07-19 DIAGNOSIS — E782 Mixed hyperlipidemia: Secondary | ICD-10-CM | POA: Diagnosis not present

## 2021-07-19 DIAGNOSIS — I10 Essential (primary) hypertension: Secondary | ICD-10-CM | POA: Diagnosis not present

## 2021-07-19 DIAGNOSIS — E1169 Type 2 diabetes mellitus with other specified complication: Secondary | ICD-10-CM | POA: Diagnosis not present

## 2021-07-19 DIAGNOSIS — E7800 Pure hypercholesterolemia, unspecified: Secondary | ICD-10-CM | POA: Diagnosis not present

## 2021-07-19 DIAGNOSIS — I2510 Atherosclerotic heart disease of native coronary artery without angina pectoris: Secondary | ICD-10-CM | POA: Diagnosis not present

## 2021-08-01 DIAGNOSIS — H2513 Age-related nuclear cataract, bilateral: Secondary | ICD-10-CM | POA: Diagnosis not present

## 2021-08-01 DIAGNOSIS — E113491 Type 2 diabetes mellitus with severe nonproliferative diabetic retinopathy without macular edema, right eye: Secondary | ICD-10-CM | POA: Diagnosis not present

## 2021-08-01 DIAGNOSIS — H11153 Pinguecula, bilateral: Secondary | ICD-10-CM | POA: Diagnosis not present

## 2021-08-01 DIAGNOSIS — E113392 Type 2 diabetes mellitus with moderate nonproliferative diabetic retinopathy without macular edema, left eye: Secondary | ICD-10-CM | POA: Diagnosis not present

## 2021-08-02 ENCOUNTER — Encounter (HOSPITAL_COMMUNITY): Payer: Self-pay | Admitting: Emergency Medicine

## 2021-08-02 ENCOUNTER — Emergency Department (HOSPITAL_COMMUNITY): Payer: Medicare HMO

## 2021-08-02 ENCOUNTER — Other Ambulatory Visit: Payer: Self-pay

## 2021-08-02 ENCOUNTER — Emergency Department (HOSPITAL_COMMUNITY)
Admission: EM | Admit: 2021-08-02 | Discharge: 2021-08-03 | Disposition: A | Discharge status: Home / Self Care | Payer: Medicare HMO | Source: Home / Self Care

## 2021-08-02 DIAGNOSIS — Z5321 Procedure and treatment not carried out due to patient leaving prior to being seen by health care provider: Secondary | ICD-10-CM | POA: Insufficient documentation

## 2021-08-02 DIAGNOSIS — R6520 Severe sepsis without septic shock: Secondary | ICD-10-CM | POA: Diagnosis not present

## 2021-08-02 DIAGNOSIS — I700 Atherosclerosis of aorta: Secondary | ICD-10-CM | POA: Diagnosis not present

## 2021-08-02 DIAGNOSIS — N1831 Chronic kidney disease, stage 3a: Secondary | ICD-10-CM | POA: Diagnosis present

## 2021-08-02 DIAGNOSIS — N179 Acute kidney failure, unspecified: Secondary | ICD-10-CM | POA: Diagnosis not present

## 2021-08-02 DIAGNOSIS — A419 Sepsis, unspecified organism: Secondary | ICD-10-CM | POA: Diagnosis not present

## 2021-08-02 DIAGNOSIS — R29810 Facial weakness: Secondary | ICD-10-CM | POA: Diagnosis not present

## 2021-08-02 DIAGNOSIS — D649 Anemia, unspecified: Secondary | ICD-10-CM | POA: Diagnosis not present

## 2021-08-02 DIAGNOSIS — R251 Tremor, unspecified: Secondary | ICD-10-CM | POA: Insufficient documentation

## 2021-08-02 DIAGNOSIS — W1830XA Fall on same level, unspecified, initial encounter: Secondary | ICD-10-CM | POA: Diagnosis present

## 2021-08-02 DIAGNOSIS — Z6838 Body mass index (BMI) 38.0-38.9, adult: Secondary | ICD-10-CM | POA: Diagnosis not present

## 2021-08-02 DIAGNOSIS — E1122 Type 2 diabetes mellitus with diabetic chronic kidney disease: Secondary | ICD-10-CM | POA: Diagnosis present

## 2021-08-02 DIAGNOSIS — R079 Chest pain, unspecified: Secondary | ICD-10-CM | POA: Diagnosis not present

## 2021-08-02 DIAGNOSIS — R569 Unspecified convulsions: Secondary | ICD-10-CM | POA: Insufficient documentation

## 2021-08-02 DIAGNOSIS — I959 Hypotension, unspecified: Secondary | ICD-10-CM | POA: Diagnosis not present

## 2021-08-02 DIAGNOSIS — R404 Transient alteration of awareness: Secondary | ICD-10-CM | POA: Diagnosis not present

## 2021-08-02 DIAGNOSIS — I252 Old myocardial infarction: Secondary | ICD-10-CM | POA: Diagnosis not present

## 2021-08-02 DIAGNOSIS — Y92410 Unspecified street and highway as the place of occurrence of the external cause: Secondary | ICD-10-CM | POA: Diagnosis not present

## 2021-08-02 DIAGNOSIS — R0902 Hypoxemia: Secondary | ICD-10-CM | POA: Diagnosis not present

## 2021-08-02 DIAGNOSIS — R7881 Bacteremia: Secondary | ICD-10-CM | POA: Diagnosis not present

## 2021-08-02 DIAGNOSIS — M1990 Unspecified osteoarthritis, unspecified site: Secondary | ICD-10-CM | POA: Diagnosis present

## 2021-08-02 DIAGNOSIS — R918 Other nonspecific abnormal finding of lung field: Secondary | ICD-10-CM | POA: Diagnosis not present

## 2021-08-02 DIAGNOSIS — R410 Disorientation, unspecified: Secondary | ICD-10-CM | POA: Diagnosis present

## 2021-08-02 DIAGNOSIS — G9341 Metabolic encephalopathy: Secondary | ICD-10-CM | POA: Diagnosis not present

## 2021-08-02 DIAGNOSIS — J189 Pneumonia, unspecified organism: Secondary | ICD-10-CM | POA: Diagnosis not present

## 2021-08-02 DIAGNOSIS — M109 Gout, unspecified: Secondary | ICD-10-CM | POA: Diagnosis present

## 2021-08-02 DIAGNOSIS — I517 Cardiomegaly: Secondary | ICD-10-CM | POA: Diagnosis not present

## 2021-08-02 DIAGNOSIS — E538 Deficiency of other specified B group vitamins: Secondary | ICD-10-CM | POA: Diagnosis present

## 2021-08-02 DIAGNOSIS — J9601 Acute respiratory failure with hypoxia: Secondary | ICD-10-CM | POA: Diagnosis not present

## 2021-08-02 DIAGNOSIS — Z955 Presence of coronary angioplasty implant and graft: Secondary | ICD-10-CM | POA: Diagnosis not present

## 2021-08-02 DIAGNOSIS — Z20822 Contact with and (suspected) exposure to covid-19: Secondary | ICD-10-CM | POA: Diagnosis not present

## 2021-08-02 DIAGNOSIS — E861 Hypovolemia: Secondary | ICD-10-CM | POA: Diagnosis present

## 2021-08-02 DIAGNOSIS — I5032 Chronic diastolic (congestive) heart failure: Secondary | ICD-10-CM | POA: Diagnosis not present

## 2021-08-02 DIAGNOSIS — Z87891 Personal history of nicotine dependence: Secondary | ICD-10-CM | POA: Diagnosis not present

## 2021-08-02 DIAGNOSIS — R509 Fever, unspecified: Secondary | ICD-10-CM | POA: Diagnosis not present

## 2021-08-02 DIAGNOSIS — I10 Essential (primary) hypertension: Secondary | ICD-10-CM | POA: Diagnosis not present

## 2021-08-02 DIAGNOSIS — J150 Pneumonia due to Klebsiella pneumoniae: Secondary | ICD-10-CM | POA: Diagnosis not present

## 2021-08-02 DIAGNOSIS — I248 Other forms of acute ischemic heart disease: Secondary | ICD-10-CM | POA: Diagnosis not present

## 2021-08-02 DIAGNOSIS — I2510 Atherosclerotic heart disease of native coronary artery without angina pectoris: Secondary | ICD-10-CM | POA: Diagnosis present

## 2021-08-02 DIAGNOSIS — R000 Tachycardia, unspecified: Secondary | ICD-10-CM | POA: Diagnosis not present

## 2021-08-02 DIAGNOSIS — E669 Obesity, unspecified: Secondary | ICD-10-CM | POA: Diagnosis present

## 2021-08-02 DIAGNOSIS — E785 Hyperlipidemia, unspecified: Secondary | ICD-10-CM | POA: Diagnosis present

## 2021-08-02 DIAGNOSIS — I130 Hypertensive heart and chronic kidney disease with heart failure and stage 1 through stage 4 chronic kidney disease, or unspecified chronic kidney disease: Secondary | ICD-10-CM | POA: Diagnosis not present

## 2021-08-02 LAB — CBC WITH DIFFERENTIAL/PLATELET
Abs Immature Granulocytes: 0.06 10*3/uL (ref 0.00–0.07)
Basophils Absolute: 0 10*3/uL (ref 0.0–0.1)
Basophils Relative: 0 %
Eosinophils Absolute: 0 10*3/uL (ref 0.0–0.5)
Eosinophils Relative: 0 %
HCT: 31.3 % — ABNORMAL LOW (ref 39.0–52.0)
Hemoglobin: 10.2 g/dL — ABNORMAL LOW (ref 13.0–17.0)
Immature Granulocytes: 0 %
Lymphocytes Relative: 2 %
Lymphs Abs: 0.3 10*3/uL — ABNORMAL LOW (ref 0.7–4.0)
MCH: 30.3 pg (ref 26.0–34.0)
MCHC: 32.6 g/dL (ref 30.0–36.0)
MCV: 92.9 fL (ref 80.0–100.0)
Monocytes Absolute: 0.4 10*3/uL (ref 0.1–1.0)
Monocytes Relative: 3 %
Neutro Abs: 12.8 10*3/uL — ABNORMAL HIGH (ref 1.7–7.7)
Neutrophils Relative %: 95 %
Platelets: 206 10*3/uL (ref 150–400)
RBC: 3.37 MIL/uL — ABNORMAL LOW (ref 4.22–5.81)
RDW: 14.6 % (ref 11.5–15.5)
WBC: 13.6 10*3/uL — ABNORMAL HIGH (ref 4.0–10.5)
nRBC: 0 % (ref 0.0–0.2)

## 2021-08-02 LAB — COMPREHENSIVE METABOLIC PANEL
ALT: 12 U/L (ref 0–44)
AST: 15 U/L (ref 15–41)
Albumin: 3.6 g/dL (ref 3.5–5.0)
Alkaline Phosphatase: 81 U/L (ref 38–126)
Anion gap: 10 (ref 5–15)
BUN: 44 mg/dL — ABNORMAL HIGH (ref 8–23)
CO2: 22 mmol/L (ref 22–32)
Calcium: 8.8 mg/dL — ABNORMAL LOW (ref 8.9–10.3)
Chloride: 104 mmol/L (ref 98–111)
Creatinine, Ser: 2.23 mg/dL — ABNORMAL HIGH (ref 0.61–1.24)
GFR, Estimated: 31 mL/min — ABNORMAL LOW (ref >60)
Glucose, Bld: 195 mg/dL — ABNORMAL HIGH (ref 70–99)
Potassium: 4 mmol/L (ref 3.5–5.1)
Sodium: 136 mmol/L (ref 135–145)
Total Bilirubin: 0.9 mg/dL (ref 0.3–1.2)
Total Protein: 7.1 g/dL (ref 6.5–8.1)

## 2021-08-02 LAB — LACTIC ACID, PLASMA: Lactic Acid, Venous: 1.6 mmol/L (ref 0.5–1.9)

## 2021-08-02 MED ORDER — ACETAMINOPHEN 325 MG PO TABS
650.0000 mg | ORAL_TABLET | Freq: Once | ORAL | Status: AC
  Administered 2021-08-02: 650 mg via ORAL
  Filled 2021-08-02: qty 2

## 2021-08-02 NOTE — ED Triage Notes (Signed)
Patient BIB [facility]. Pt was found by bystanders laying in the middle of road, having seizure-like activity, resolved except for minor tremors when EMS arrived.

## 2021-08-02 NOTE — ED Provider Notes (Signed)
Emergency Medicine Provider Triage Evaluation Note  Gregory [name] , a 69 y.o. male  was evaluated in triage.  Pt complains of fall. Ems was called after pt was found down outside on a sidwalk. Pt states he was walking to his truck and tripped stepping off a curb. He states he was not on the ground for long before passerby called ems. Denies head trauma or any injuries from the fall. States he has been feeling tired recently but has no specific infectious complaints.  Review of Systems  Positive: Fatigue, fall Negative: Chest pain, abd pain, nvd, urinary sxs  Physical Exam  BP 126/68 (BP Location: Right Arm)   Pulse (!) 125   Temp (!) 102.9 F (39.4 C) (Oral)   Resp (!) 24   SpO2 95%  Gen:   Awake, no distress   Resp:  Normal effort  MSK:   Moves extremities without difficulty  Other:  Tachycardia, no facial droop, clear speech, 5/5 strength to the bue/ble, abd soft and nontender, dry mucous membranes  Medical Decision Making  Medically screening exam initiated at 6:36 PM.  Appropriate orders placed.  [name] was informed that the remainder of the evaluation will be completed by another provider, this initial triage assessment does not replace that evaluation, and the importance of remaining in the ED until their evaluation is complete.     Bishop Dublin 08/02/21 Duanne Limerick, MD 08/02/21 2216

## 2021-08-03 ENCOUNTER — Inpatient Hospital Stay (HOSPITAL_COMMUNITY)
Admission: EM | Admit: 2021-08-03 | Discharge: 2021-08-09 | DRG: 871 | Disposition: A | Discharge status: Home / Self Care | Payer: Medicare HMO | Attending: Internal Medicine | Admitting: Internal Medicine

## 2021-08-03 ENCOUNTER — Telehealth (HOSPITAL_BASED_OUTPATIENT_CLINIC_OR_DEPARTMENT_OTHER): Payer: Self-pay

## 2021-08-03 ENCOUNTER — Emergency Department (HOSPITAL_COMMUNITY): Payer: Medicare HMO

## 2021-08-03 ENCOUNTER — Encounter (HOSPITAL_COMMUNITY): Payer: Self-pay

## 2021-08-03 ENCOUNTER — Other Ambulatory Visit: Payer: Self-pay

## 2021-08-03 DIAGNOSIS — E861 Hypovolemia: Secondary | ICD-10-CM | POA: Diagnosis present

## 2021-08-03 DIAGNOSIS — Z955 Presence of coronary angioplasty implant and graft: Secondary | ICD-10-CM

## 2021-08-03 DIAGNOSIS — M109 Gout, unspecified: Secondary | ICD-10-CM | POA: Diagnosis present

## 2021-08-03 DIAGNOSIS — J9601 Acute respiratory failure with hypoxia: Secondary | ICD-10-CM | POA: Diagnosis present

## 2021-08-03 DIAGNOSIS — E669 Obesity, unspecified: Secondary | ICD-10-CM | POA: Diagnosis present

## 2021-08-03 DIAGNOSIS — Y92410 Unspecified street and highway as the place of occurrence of the external cause: Secondary | ICD-10-CM

## 2021-08-03 DIAGNOSIS — Z6838 Body mass index (BMI) 38.0-38.9, adult: Secondary | ICD-10-CM | POA: Diagnosis not present

## 2021-08-03 DIAGNOSIS — N1831 Chronic kidney disease, stage 3a: Secondary | ICD-10-CM | POA: Diagnosis present

## 2021-08-03 DIAGNOSIS — I130 Hypertensive heart and chronic kidney disease with heart failure and stage 1 through stage 4 chronic kidney disease, or unspecified chronic kidney disease: Secondary | ICD-10-CM | POA: Diagnosis present

## 2021-08-03 DIAGNOSIS — R7881 Bacteremia: Secondary | ICD-10-CM

## 2021-08-03 DIAGNOSIS — Z87891 Personal history of nicotine dependence: Secondary | ICD-10-CM

## 2021-08-03 DIAGNOSIS — W1830XA Fall on same level, unspecified, initial encounter: Secondary | ICD-10-CM | POA: Diagnosis present

## 2021-08-03 DIAGNOSIS — R6520 Severe sepsis without septic shock: Secondary | ICD-10-CM | POA: Diagnosis present

## 2021-08-03 DIAGNOSIS — M1990 Unspecified osteoarthritis, unspecified site: Secondary | ICD-10-CM | POA: Diagnosis present

## 2021-08-03 DIAGNOSIS — I2510 Atherosclerotic heart disease of native coronary artery without angina pectoris: Secondary | ICD-10-CM | POA: Diagnosis present

## 2021-08-03 DIAGNOSIS — A419 Sepsis, unspecified organism: Secondary | ICD-10-CM | POA: Diagnosis present

## 2021-08-03 DIAGNOSIS — E538 Deficiency of other specified B group vitamins: Secondary | ICD-10-CM | POA: Diagnosis present

## 2021-08-03 DIAGNOSIS — R410 Disorientation, unspecified: Secondary | ICD-10-CM | POA: Diagnosis present

## 2021-08-03 DIAGNOSIS — Z7902 Long term (current) use of antithrombotics/antiplatelets: Secondary | ICD-10-CM

## 2021-08-03 DIAGNOSIS — E785 Hyperlipidemia, unspecified: Secondary | ICD-10-CM | POA: Diagnosis present

## 2021-08-03 DIAGNOSIS — I10 Essential (primary) hypertension: Secondary | ICD-10-CM | POA: Diagnosis present

## 2021-08-03 DIAGNOSIS — J189 Pneumonia, unspecified organism: Secondary | ICD-10-CM | POA: Diagnosis not present

## 2021-08-03 DIAGNOSIS — I252 Old myocardial infarction: Secondary | ICD-10-CM

## 2021-08-03 DIAGNOSIS — I248 Other forms of acute ischemic heart disease: Secondary | ICD-10-CM | POA: Diagnosis present

## 2021-08-03 DIAGNOSIS — E1122 Type 2 diabetes mellitus with diabetic chronic kidney disease: Secondary | ICD-10-CM | POA: Diagnosis present

## 2021-08-03 DIAGNOSIS — I5032 Chronic diastolic (congestive) heart failure: Secondary | ICD-10-CM | POA: Diagnosis present

## 2021-08-03 DIAGNOSIS — Z79899 Other long term (current) drug therapy: Secondary | ICD-10-CM

## 2021-08-03 DIAGNOSIS — D649 Anemia, unspecified: Secondary | ICD-10-CM | POA: Diagnosis present

## 2021-08-03 DIAGNOSIS — E1159 Type 2 diabetes mellitus with other circulatory complications: Secondary | ICD-10-CM

## 2021-08-03 DIAGNOSIS — Z794 Long term (current) use of insulin: Secondary | ICD-10-CM

## 2021-08-03 DIAGNOSIS — N179 Acute kidney failure, unspecified: Secondary | ICD-10-CM | POA: Diagnosis present

## 2021-08-03 DIAGNOSIS — Z20822 Contact with and (suspected) exposure to covid-19: Secondary | ICD-10-CM | POA: Diagnosis present

## 2021-08-03 DIAGNOSIS — J150 Pneumonia due to Klebsiella pneumoniae: Secondary | ICD-10-CM | POA: Diagnosis present

## 2021-08-03 DIAGNOSIS — Z7984 Long term (current) use of oral hypoglycemic drugs: Secondary | ICD-10-CM

## 2021-08-03 DIAGNOSIS — G9341 Metabolic encephalopathy: Secondary | ICD-10-CM | POA: Diagnosis present

## 2021-08-03 LAB — CBC WITH DIFFERENTIAL/PLATELET
Abs Immature Granulocytes: 0.07 10*3/uL (ref 0.00–0.07)
Basophils Absolute: 0 10*3/uL (ref 0.0–0.1)
Basophils Relative: 0 %
Eosinophils Absolute: 0 10*3/uL (ref 0.0–0.5)
Eosinophils Relative: 0 %
HCT: 30.7 % — ABNORMAL LOW (ref 39.0–52.0)
Hemoglobin: 9.9 g/dL — ABNORMAL LOW (ref 13.0–17.0)
Immature Granulocytes: 1 %
Lymphocytes Relative: 4 %
Lymphs Abs: 0.5 10*3/uL — ABNORMAL LOW (ref 0.7–4.0)
MCH: 29.9 pg (ref 26.0–34.0)
MCHC: 32.2 g/dL (ref 30.0–36.0)
MCV: 92.7 fL (ref 80.0–100.0)
Monocytes Absolute: 0.8 10*3/uL (ref 0.1–1.0)
Monocytes Relative: 6 %
Neutro Abs: 11.7 10*3/uL — ABNORMAL HIGH (ref 1.7–7.7)
Neutrophils Relative %: 89 %
Platelets: 188 10*3/uL (ref 150–400)
RBC: 3.31 MIL/uL — ABNORMAL LOW (ref 4.22–5.81)
RDW: 14.8 % (ref 11.5–15.5)
WBC: 13.1 10*3/uL — ABNORMAL HIGH (ref 4.0–10.5)
nRBC: 0 % (ref 0.0–0.2)

## 2021-08-03 LAB — BLOOD CULTURE ID PANEL (REFLEXED) - BCID2

## 2021-08-03 LAB — RESP PANEL BY RT-PCR (FLU A&B, COVID) ARPGX2
Influenza A by PCR: NEGATIVE
Influenza B by PCR: NEGATIVE
SARS Coronavirus 2 by RT PCR: NEGATIVE

## 2021-08-03 LAB — PROTIME-INR
INR: 1.2 (ref 0.8–1.2)
Prothrombin Time: 15 seconds (ref 11.4–15.2)

## 2021-08-03 LAB — CBC
HCT: 28.6 % — ABNORMAL LOW (ref 39.0–52.0)
Hemoglobin: 9.1 g/dL — ABNORMAL LOW (ref 13.0–17.0)
MCH: 29.8 pg (ref 26.0–34.0)
MCHC: 31.8 g/dL (ref 30.0–36.0)
MCV: 93.8 fL (ref 80.0–100.0)
Platelets: 170 10*3/uL (ref 150–400)
RBC: 3.05 MIL/uL — ABNORMAL LOW (ref 4.22–5.81)
RDW: 14.9 % (ref 11.5–15.5)
WBC: 13.2 10*3/uL — ABNORMAL HIGH (ref 4.0–10.5)
nRBC: 0 % (ref 0.0–0.2)

## 2021-08-03 LAB — URINALYSIS, ROUTINE W REFLEX MICROSCOPIC
Bilirubin Urine: NEGATIVE
Glucose, UA: NEGATIVE mg/dL
Ketones, ur: 5 mg/dL — AB
Leukocytes,Ua: NEGATIVE
Nitrite: NEGATIVE
Protein, ur: 30 mg/dL — AB
Specific Gravity, Urine: 1.01 (ref 1.005–1.030)
pH: 5 (ref 5.0–8.0)

## 2021-08-03 LAB — COMPREHENSIVE METABOLIC PANEL
ALT: 23 U/L (ref 0–44)
AST: 43 U/L — ABNORMAL HIGH (ref 15–41)
Albumin: 3.7 g/dL (ref 3.5–5.0)
Alkaline Phosphatase: 111 U/L (ref 38–126)
Anion gap: 14 (ref 5–15)
BUN: 47 mg/dL — ABNORMAL HIGH (ref 8–23)
CO2: 20 mmol/L — ABNORMAL LOW (ref 22–32)
Calcium: 9.2 mg/dL (ref 8.9–10.3)
Chloride: 106 mmol/L (ref 98–111)
Creatinine, Ser: 2.32 mg/dL — ABNORMAL HIGH (ref 0.61–1.24)
GFR, Estimated: 29 mL/min — ABNORMAL LOW (ref >60)
Glucose, Bld: 193 mg/dL — ABNORMAL HIGH (ref 70–99)
Potassium: 3.8 mmol/L (ref 3.5–5.1)
Sodium: 140 mmol/L (ref 135–145)
Total Bilirubin: 0.9 mg/dL (ref 0.3–1.2)
Total Protein: 7.8 g/dL (ref 6.5–8.1)

## 2021-08-03 LAB — RETICULOCYTES
Immature Retic Fract: 12.3 % (ref 2.3–15.9)
RBC.: 3.02 MIL/uL — ABNORMAL LOW (ref 4.22–5.81)
Retic Count, Absolute: 29.9 10*3/uL (ref 19.0–186.0)
Retic Ct Pct: 1 % (ref 0.4–3.1)

## 2021-08-03 LAB — BLOOD GAS, ARTERIAL
Acid-base deficit: 3.3 mmol/L — ABNORMAL HIGH (ref 0.0–2.0)
Bicarbonate: 19.9 mmol/L — ABNORMAL LOW (ref 20.0–28.0)
O2 Saturation: 95.5 %
Patient temperature: 104.4
pCO2 arterial: 35.7 mmHg (ref 32.0–48.0)
pH, Arterial: 7.382 (ref 7.350–7.450)
pO2, Arterial: 89.7 mmHg (ref 83.0–108.0)

## 2021-08-03 LAB — IRON AND TIBC
Iron: 9 ug/dL — ABNORMAL LOW (ref 45–182)
Saturation Ratios: 4 % — ABNORMAL LOW (ref 17.9–39.5)
TIBC: 208 ug/dL — ABNORMAL LOW (ref 250–450)
UIBC: 199 ug/dL

## 2021-08-03 LAB — CREATININE, SERUM
Creatinine, Ser: 2.01 mg/dL — ABNORMAL HIGH (ref 0.61–1.24)
GFR, Estimated: 35 mL/min — ABNORMAL LOW (ref >60)

## 2021-08-03 LAB — FOLATE: Folate: 10.9 ng/mL (ref >5.9)

## 2021-08-03 LAB — MRSA NEXT GEN BY PCR, NASAL: MRSA by PCR Next Gen: NOT DETECTED

## 2021-08-03 LAB — TYPE AND SCREEN
ABO/RH(D): A POS
Antibody Screen: NEGATIVE

## 2021-08-03 LAB — APTT: aPTT: 30 seconds (ref 24–36)

## 2021-08-03 LAB — TROPONIN I (HIGH SENSITIVITY)
Troponin I (High Sensitivity): 95 ng/L — ABNORMAL HIGH (ref <18)
Troponin I (High Sensitivity): 106 ng/L (ref <18)

## 2021-08-03 LAB — LACTIC ACID, PLASMA
Lactic Acid, Venous: 1.2 mmol/L (ref 0.5–1.9)
Lactic Acid, Venous: 1.8 mmol/L (ref 0.5–1.9)

## 2021-08-03 LAB — TSH: TSH: 1.242 u[IU]/mL (ref 0.350–4.500)

## 2021-08-03 LAB — GLUCOSE, CAPILLARY: Glucose-Capillary: 124 mg/dL — ABNORMAL HIGH (ref 70–99)

## 2021-08-03 LAB — FERRITIN: Ferritin: 535 ng/mL — ABNORMAL HIGH (ref 24–336)

## 2021-08-03 LAB — VITAMIN B12: Vitamin B-12: 148 pg/mL — ABNORMAL LOW (ref 180–914)

## 2021-08-03 MED ORDER — SODIUM CHLORIDE 0.9 % IV SOLN
2.0000 g | Freq: Once | INTRAVENOUS | Status: AC
  Administered 2021-08-03: 2 g via INTRAVENOUS
  Filled 2021-08-03: qty 2

## 2021-08-03 MED ORDER — ACETAMINOPHEN 325 MG PO TABS
650.0000 mg | ORAL_TABLET | Freq: Four times a day (QID) | ORAL | Status: DC | PRN
  Administered 2021-08-03 – 2021-08-08 (×5): 650 mg via ORAL
  Filled 2021-08-03 (×5): qty 2

## 2021-08-03 MED ORDER — LACTATED RINGERS IV BOLUS (SEPSIS)
1000.0000 mL | Freq: Once | INTRAVENOUS | Status: AC
  Administered 2021-08-03: 1000 mL via INTRAVENOUS

## 2021-08-03 MED ORDER — ENSURE ENLIVE PO LIQD
237.0000 mL | Freq: Two times a day (BID) | ORAL | Status: DC
  Administered 2021-08-04 – 2021-08-07 (×5): 237 mL via ORAL

## 2021-08-03 MED ORDER — LACTATED RINGERS IV BOLUS
1000.0000 mL | Freq: Once | INTRAVENOUS | Status: AC
  Administered 2021-08-03: 1000 mL via INTRAVENOUS

## 2021-08-03 MED ORDER — ENOXAPARIN SODIUM 40 MG/0.4ML IJ SOSY
40.0000 mg | PREFILLED_SYRINGE | INTRAMUSCULAR | Status: DC
  Administered 2021-08-03 – 2021-08-08 (×6): 40 mg via SUBCUTANEOUS
  Filled 2021-08-03 (×6): qty 0.4

## 2021-08-03 MED ORDER — SODIUM CHLORIDE 0.9 % IV SOLN
2.0000 g | INTRAVENOUS | Status: DC
  Administered 2021-08-03 – 2021-08-04 (×2): 2 g via INTRAVENOUS
  Filled 2021-08-03 (×2): qty 20

## 2021-08-03 MED ORDER — METRONIDAZOLE 500 MG/100ML IV SOLN
500.0000 mg | Freq: Once | INTRAVENOUS | Status: AC
  Administered 2021-08-03: 500 mg via INTRAVENOUS
  Filled 2021-08-03: qty 100

## 2021-08-03 MED ORDER — VANCOMYCIN HCL IN DEXTROSE 1-5 GM/200ML-% IV SOLN
1000.0000 mg | Freq: Once | INTRAVENOUS | Status: AC
  Administered 2021-08-03: 1000 mg via INTRAVENOUS
  Filled 2021-08-03: qty 200

## 2021-08-03 MED ORDER — LACTATED RINGERS IV SOLN: INTRAVENOUS | Status: AC

## 2021-08-03 MED ORDER — LACTATED RINGERS IV SOLN: INTRAVENOUS | Status: DC

## 2021-08-03 MED ORDER — ACETAMINOPHEN 650 MG RE SUPP: 650.0000 mg | Freq: Four times a day (QID) | RECTAL | Status: DC | PRN

## 2021-08-03 MED ORDER — ACETAMINOPHEN 500 MG PO TABS
1000.0000 mg | ORAL_TABLET | Freq: Once | ORAL | Status: AC
  Administered 2021-08-03: 1000 mg via ORAL
  Filled 2021-08-03: qty 2

## 2021-08-03 MED ORDER — INSULIN ASPART 100 UNIT/ML IJ SOLN
0.0000 [IU] | Freq: Three times a day (TID) | INTRAMUSCULAR | Status: DC
  Administered 2021-08-04 (×2): 1 [IU] via SUBCUTANEOUS
  Administered 2021-08-04: 2 [IU] via SUBCUTANEOUS
  Administered 2021-08-05: 3 [IU] via SUBCUTANEOUS
  Administered 2021-08-06 (×2): 1 [IU] via SUBCUTANEOUS
  Administered 2021-08-06 – 2021-08-07 (×2): 2 [IU] via SUBCUTANEOUS
  Administered 2021-08-07: 1 [IU] via SUBCUTANEOUS
  Administered 2021-08-08: 2 [IU] via SUBCUTANEOUS
  Administered 2021-08-08: 1 [IU] via SUBCUTANEOUS
  Administered 2021-08-08: 2 [IU] via SUBCUTANEOUS
  Filled 2021-08-03: qty 0.09

## 2021-08-03 NOTE — ED Notes (Signed)
Patient is alert and oriented to name, DOB, place and month. Patient states he does not use anything to assist him in walking and lives at home.

## 2021-08-03 NOTE — ED Notes (Addendum)
Pt. Placed on 2L/min O2 per Dr. Armandina Gemma.

## 2021-08-03 NOTE — Progress Notes (Signed)
A consult was received from an ED physician for vancomycin and cefepime per pharmacy dosing.  The patient's profile has been reviewed for ht/wt/allergies/indication/available labs.   A one time order has been placed for cefepime 2g IV x1 and vancomycin 1g. Will order additional 1g vancomycin for total 2g load.  Further antibiotics/pharmacy consults should be ordered by admitting physician if indicated.                       Thank you,  Dimple Nanas, PharmD 08/03/2021 3:43 PM

## 2021-08-03 NOTE — Telephone Encounter (Signed)
This RN was made aware that patient had positive BC from visit yesterday after discussing with ED MD Tyrone Nine, patient should be advised to go back to ED. Spoke with patient on the phone and explained situation. Pt reports he is continuing to have chills and states that he will follow up at ED.

## 2021-08-03 NOTE — ED Provider Notes (Signed)
[facility] DEPT Provider Note   CSN: [id number] Arrival date & time: 08/03/21  1338     History Chief Complaint  Patient presents with   Abnormal Lab   Fall   Back Pain    Gregory [name] is a 70 y.o. male.  HPI  69 year old male with medical history as per below presenting to the emergency department with concern for sepsis.  The patient was in the [facility] emergency department yesterday and triage vitals and screening lab work was obtained but the patient left prior to being seen due to the wait time.  He was called to come back to the emergency department due to a positive blood culture.  He is unable to provide much of the history as he is somnolent and encephalopathic.  Presents emerged department febrile and tachycardic.  Code sepsis initiated on patient arrival.  Level 5 caveat due to patient mental status and acuity.  On chart review, patient appears to have fallen after a syncopal episode after tripping on a curb yesterday. Had been feeling more fatigued recently without specific infectious complaints.  Imaging work-up yesterday revealed a chest x-ray revealed hazy bibasilar airspace opacities which could reflect developing infection, CT head without acute intracranial abnormality.  CBC yesterday with a leukocytosis to 13.6, anemia to 10.2 from baseline of around 15, lactic acid 1.6, blood culture positive for both Klebsiella pneumoniae and Enterobacterales.   Past Medical History:  Diagnosis Date   Arthritis    Cancer ([place])    Coronary artery disease    Gout    Hyperlipidemia    Hypertension    Myocardial infarction [facility]) 05/2012   "mild"   Swelling of left lower extremity    LLE; "happens often"   Type II diabetes mellitus ([place])     Patient Active Problem List   Diagnosis Date Noted   CAD (coronary artery disease) 08/03/2021   ARF (acute renal failure) ([place]) 08/03/2021   Anemia 08/03/2021   Acute respiratory failure due to  COVID-19 ([place]) 10/22/2019   Pneumonia due to severe acute respiratory syndrome coronavirus 2 (SARS-CoV-2) 10/21/2019   CAP (community acquired pneumonia) 02/13/2015   Hyperbilirubinemia    Acute respiratory failure with hypoxia ([place]) 02/06/2015   Sepsis ([place]) 02/06/2015   SIRS (systemic inflammatory response syndrome) ([place]) 02/05/2015   Acute on chronic renal failure (HCC) 02/05/2015   Chronic diastolic heart failure ([place]) 02/05/2015   Cellulitis of left arm    Essential hypertension    Cellulitis 08/22/2014   Diabetes mellitus type 2, uncontrolled ([place]) 08/22/2014   Chronic kidney disease, stage 3 ([place]) 08/22/2014   Malignant neoplasm of prostate ([place]) 02/25/2014   Coronary atherosclerosis of native coronary artery 10/06/2013   Obesity, unspecified 10/06/2013   Chest pain 06/03/2012   DM (diabetes mellitus) ([place]) 06/03/2012   HTN (hypertension) 06/03/2012   Hyperlipidemia 06/03/2012    Past Surgical History:  Procedure Laterality Date   BACK SURGERY     CORONARY ANGIOPLASTY WITH STENT PLACEMENT  06/05/2012   "2; total of 2"   LEFT HEART CATHETERIZATION WITH CORONARY ANGIOGRAM N/A 06/05/2012   Procedure: LEFT HEART CATHETERIZATION WITH CORONARY ANGIOGRAM;  Surgeon: Jettie Booze, MD;  Location: [facility] CATH LAB;  Service: Cardiovascular;  Laterality: N/A;  possible PCI   LUMBAR DISC SURGERY  1990's   LYMPHADENECTOMY Bilateral 02/25/2014   Procedure: [facility] LYMPH NODE DISSECTION";  Surgeon: Molli Hazard, MD;  Location: [facility] ORS;  Service: Urology;  Laterality: Bilateral;   PROSTATE SURGERY  ROBOT ASSISTED LAPAROSCOPIC RADICAL PROSTATECTOMY N/A 02/25/2014   Procedure: ROBOTIC ASSISTED LAPAROSCOPIC RADICAL  PROSTATECTOMY,  UMBILICAL HERNIA REPAIR;  Surgeon: Molli Hazard, MD;  Location: [facility] ORS;  Service: Urology;  Laterality: N/A;       Family History  Problem Relation Age of Onset   Breast cancer Mother    Asthma Father    Parkinson's disease  Father     Social History   Tobacco Use   Smoking status: Former    Packs/day: 2.00    Years: 30.00    Pack years: 60.00    Types: Cigarettes    Quit date: 10/23/1993    Years since quitting: 27.8   Smokeless tobacco: Never  Vaping Use   Vaping Use: Never used  Substance Use Topics   Alcohol use: No   Drug use: No    Home Medications Prior to Admission medications   Medication Sig Start Date End Date Taking? Authorizing Provider  allopurinol (ZYLOPRIM) 300 MG tablet Take 300 mg by mouth daily as needed (gout).     [provider]  amLODipine-valsartan (EXFORGE) 10-320 MG per tablet Take 1 tablet by mouth daily.    [provider]  atorvastatin (LIPITOR) 10 MG tablet Take 10 mg by mouth daily at 6 PM.  08/23/15   [provider]  clopidogrel (PLAVIX) 75 MG tablet TAKE 1 TABLET (75 MG TOTAL) BY MOUTH DAILY. 10/22/14   Jettie Booze, MD  furosemide (LASIX) 40 MG tablet Take 1 tablet (40 mg total) by mouth daily as needed. For swelling 12/23/20   Jettie Booze, MD  insulin glargine (LANTUS) 100 UNIT/ML injection Inject 0.3 mLs (30 Units total) into the skin every morning. 10/26/19   Georgette Shell, MD  metFORMIN (GLUCOPHAGE) 1000 MG tablet Take 1,000 mg by mouth 2 (two) times daily with a meal.    [provider]  metoprolol tartrate (LOPRESSOR) 25 MG tablet Take 12.5 mg by mouth 2 (two) times daily. 11/01/20   [provider]  nitroGLYCERIN (NITROSTAT) 0.4 MG SL tablet Place 1 tablet (0.4 mg total) under the tongue every 5 (five) minutes as needed for chest pain. 08/21/17   Jettie Booze, MD    Allergies    Patient has no known allergies.  Review of Systems   Review of Systems  Unable to perform ROS: Mental status change   Physical Exam Updated Vital Signs BP (!) 133/55   Pulse 86   Temp 99.8 F (37.7 C) (Rectal)   Resp (!) 29   Ht 5\' 8"  (1.727 m)   Wt 114.8 kg   SpO2 100%   BMI 38.47 kg/m   Physical  Exam Vitals and nursing note reviewed.  Constitutional:      General: He is not in acute distress.    Appearance: He is well-developed.     Comments: Alert and oriented x3, somnolent, confused, GCS 14  HENT:     Head: Normocephalic and atraumatic.  Eyes:     Conjunctiva/sclera: Conjunctivae normal.     Pupils: Pupils are equal, round, and reactive to light.  Neck:     Comments: Negative for meningismus Cardiovascular:     Rate and Rhythm: Regular rhythm.     Heart sounds: No murmur heard. Pulmonary:     Effort: Pulmonary effort is normal. No respiratory distress.     Breath sounds: Normal breath sounds.  Abdominal:     General: There is no distension.     Palpations: Abdomen is  soft.     Tenderness: There is no abdominal tenderness. There is no guarding.  Musculoskeletal:        General: No deformity or signs of injury.     Cervical back: Full passive range of motion without pain, normal range of motion and neck supple.     Right lower leg: No edema.     Left lower leg: Edema present.     Comments: 3+ pitting edema in the left lower extremity, no evidence of erythema or crepitus  Skin:    General: Skin is warm and dry.     Findings: No lesion or rash.  Neurological:     Mental Status: He is alert.     Comments: Alert and oriented x3, somnolent, confused, GCS 14    ED Results / Procedures / Treatments   Labs (all labs ordered are listed, but only abnormal results are displayed) Labs Reviewed  COMPREHENSIVE METABOLIC PANEL - Abnormal; Notable for the following components:      Result Value   CO2 20 (*)    Glucose, Bld 193 (*)    BUN 47 (*)    Creatinine, Ser 2.32 (*)    AST 43 (*)    GFR, Estimated 29 (*)    All other components within normal limits  CBC WITH DIFFERENTIAL/PLATELET - Abnormal; Notable for the following components:   WBC 13.1 (*)    RBC 3.31 (*)    Hemoglobin 9.9 (*)    HCT 30.7 (*)    Neutro Abs 11.7 (*)    Lymphs Abs 0.5 (*)    All other  components within normal limits  URINALYSIS, ROUTINE W REFLEX MICROSCOPIC - Abnormal; Notable for the following components:   APPearance HAZY (*)    Hgb urine dipstick MODERATE (*)    Ketones, ur 5 (*)    Protein, ur 30 (*)    Bacteria, UA FEW (*)    All other components within normal limits  CBC - Abnormal; Notable for the following components:   WBC 13.2 (*)    RBC 3.05 (*)    Hemoglobin 9.1 (*)    HCT 28.6 (*)    All other components within normal limits  CREATININE, SERUM - Abnormal; Notable for the following components:   Creatinine, Ser 2.01 (*)    GFR, Estimated 35 (*)    All other components within normal limits  VITAMIN B12 - Abnormal; Notable for the following components:   Vitamin B-12 148 (*)    All other components within normal limits  IRON AND TIBC - Abnormal; Notable for the following components:   Iron 9 (*)    TIBC 208 (*)    Saturation Ratios 4 (*)    All other components within normal limits  FERRITIN - Abnormal; Notable for the following components:   Ferritin 535 (*)    All other components within normal limits  RETICULOCYTES - Abnormal; Notable for the following components:   RBC. 3.02 (*)    All other components within normal limits  CBC WITH DIFFERENTIAL/PLATELET - Abnormal; Notable for the following components:   WBC 13.0 (*)    RBC 3.00 (*)    Hemoglobin 9.0 (*)    HCT 28.3 (*)    Neutro Abs 10.8 (*)    Monocytes Absolute 1.3 (*)    All other components within normal limits  COMPREHENSIVE METABOLIC PANEL - Abnormal; Notable for the following components:   CO2 20 (*)    Glucose, Bld 144 (*)  BUN 36 (*)    Creatinine, Ser 1.77 (*)    Total Protein 6.2 (*)    Albumin 2.9 (*)    AST 51 (*)    GFR, Estimated 41 (*)    All other components within normal limits  BLOOD GAS, ARTERIAL - Abnormal; Notable for the following components:   Bicarbonate 19.9 (*)    Acid-base deficit 3.3 (*)    All other components within normal limits  GLUCOSE,  CAPILLARY - Abnormal; Notable for the following components:   Glucose-Capillary 124 (*)    All other components within normal limits  GLUCOSE, CAPILLARY - Abnormal; Notable for the following components:   Glucose-Capillary 124 (*)    All other components within normal limits  TROPONIN I (HIGH SENSITIVITY) - Abnormal; Notable for the following components:   Troponin I (High Sensitivity) 95 (*)    All other components within normal limits  TROPONIN I (HIGH SENSITIVITY) - Abnormal; Notable for the following components:   Troponin I (High Sensitivity) 106 (*)    All other components within normal limits  TROPONIN I (HIGH SENSITIVITY) - Abnormal; Notable for the following components:   Troponin I (High Sensitivity) 98 (*)    All other components within normal limits  TROPONIN I (HIGH SENSITIVITY) - Abnormal; Notable for the following components:   Troponin I (High Sensitivity) 102 (*)    All other components within normal limits  RESP PANEL BY RT-PCR (FLU A&B, COVID) ARPGX2  CULTURE, BLOOD (ROUTINE X 2)  CULTURE, BLOOD (ROUTINE X 2)  MRSA NEXT GEN BY PCR, NASAL  URINE CULTURE  EXPECTORATED SPUTUM ASSESSMENT W GRAM STAIN, RFLX TO RESP C  LACTIC ACID, PLASMA  PROTIME-INR  APTT  FOLATE  STREP PNEUMONIAE URINARY ANTIGEN  LACTIC ACID, PLASMA  TSH  LEGIONELLA PNEUMOPHILA SEROGP 1 UR AG  TYPE AND SCREEN    EKG EKG Interpretation  Date/Time:  Wednesday August 03 2021 16:11:32 EDT Ventricular Rate:  121 PR Interval:  175 QRS Duration: 94 QT Interval:  297 QTC Calculation: 422 R Axis:   14 Text Interpretation: Sinus tachycardia Borderline T wave abnormalities Confirmed by Regan Lemming ([id number]) on 08/03/2021 4:20:38 PM  Radiology DG Chest 2 View  Result Date: 08/02/2021 CLINICAL DATA:  Fever EXAM: CHEST - 2 VIEW COMPARISON:  Radiograph 10/25/2019 FINDINGS: Unchanged, borderline enlarged cardiac silhouette. There are hazy bibasilar airspace opacities. No large pleural effusion or  visible pneumothorax. No acute osseous abnormality. Thoracic spondylosis. Coronary stents. IMPRESSION: Hazy bibasilar airspace opacities, could be developing infection Electronically Signed   By: Maurine Simmering M.D.   On: 08/02/2021 19:31   CT Head Wo Contrast  Result Date: 08/02/2021 CLINICAL DATA:  Found down, seizures EXAM: CT HEAD WITHOUT CONTRAST TECHNIQUE: Contiguous axial images were obtained from the base of the skull through the vertex without intravenous contrast. COMPARISON:  None. FINDINGS: Brain: Scattered nonspecific hypodensities throughout the periventricular white matter most likely related to a chronic small vessel ischemic changes. No evidence of acute infarct or hemorrhage. Lateral ventricles and remaining midline structures are unremarkable. No acute extra-axial fluid collections. No mass effect. Vascular: Extensive atherosclerosis of the internal carotid arteries. No hyperdense vessel. Skull: Normal. Negative for fracture or focal lesion. Sinuses/Orbits: No acute finding. Other: None. IMPRESSION: 1. Scattered white matter hypodensities most consistent with chronic small vessel ischemic change. 2. Otherwise no acute intracranial process. Electronically Signed   By: Randa Ngo M.D.   On: 08/02/2021 21:57   CT Chest Wo Contrast  Result Date: 08/03/2021 CLINICAL  DATA:  Fell yesterday, left-sided back pain, abnormal chest x-ray EXAM: CT CHEST WITHOUT CONTRAST TECHNIQUE: Multidetector CT imaging of the chest was performed following the standard protocol without IV contrast. COMPARISON:  Dental 22 FINDINGS: Cardiovascular: Unenhanced imaging of the heart demonstrates mild cardiomegaly with prominent biatrial dilation. No pericardial effusion. There is extensive atherosclerosis of the coronary vasculature. Normal caliber of the thoracic aorta without evidence of aneurysm. Evaluation of the vascular lumen limited without IV contrast. Minimal atherosclerosis. Mediastinum/Nodes: Borderline  enlarged right paratracheal lymph nodes measuring up to 13 mm in short axis reference image 29/2. No other pathologic adenopathy. Thyroid, trachea, and esophagus are unremarkable. Lungs/Pleura: There is bilateral lower lobe bronchial wall thickening, with patchy airspace disease consistent with bronchopneumonia. No effusion or pneumothorax. Central airways are patent. Upper Abdomen: No acute abnormality. Musculoskeletal: No acute or destructive bony lesions. Reconstructed images demonstrate no additional findings. IMPRESSION: 1. Bilateral lower lobe bronchial wall thickening and patchy airspace disease consistent with bronchopneumonia. 2. Borderline enlarged right paratracheal lymph node, likely reactive. 3. Cardiomegaly. 4. Aortic Atherosclerosis (ICD10-I70.0). Coronary artery atherosclerosis. Electronically Signed   By: Randa Ngo M.D.   On: 08/03/2021 18:25   DG Chest Port 1 View  Result Date: 08/03/2021 CLINICAL DATA:  Questionable sepsis EXAM: PORTABLE CHEST 1 VIEW COMPARISON:  08/02/2021 FINDINGS: Low lung volumes. Unchanged, borderline enlarged cardiac silhouette, given differences in technique. Redemonstrated hazy bibasilar opacities, without new focal consolidative opacity. No definite pleural effusion pneumothorax. No acute osseous abnormality. IMPRESSION: Redemonstrated hazy bibasilar airspace opacities, which are nonspecific could represent developing infection. No new focal pulmonary opacity. Electronically Signed   By: Merilyn Baba M.D.   On: 08/03/2021 16:21    Procedures .Critical Care Performed by: Regan Lemming, MD Authorized by: Regan Lemming, MD   Critical care provider statement:    Critical care time (minutes):  42   Critical care was necessary to treat or prevent imminent or life-threatening deterioration of the following conditions:  Sepsis   Critical care was time spent personally by me on the following activities:  Discussions with primary provider, evaluation of  patient's response to treatment, examination of patient, obtaining history from patient or surrogate, ordering and performing treatments and interventions, ordering and review of laboratory studies, ordering and review of radiographic studies, pulse oximetry and review of old charts   Medications Ordered in ED Medications  lactated ringers infusion ( Intravenous Infusion Verify 08/04/21 0900)  insulin aspart (novoLOG) injection 0-9 Units (1 Units Subcutaneous Given 08/04/21 0829)  enoxaparin (LOVENOX) injection 40 mg (40 mg Subcutaneous Given 08/03/21 2206)  acetaminophen (TYLENOL) tablet 650 mg (650 mg Oral Given 08/03/21 2206)    Or  acetaminophen (TYLENOL) suppository 650 mg ( Rectal See Alternative 08/03/21 2206)  cefTRIAXone (ROCEPHIN) 2 g in sodium chloride 0.9 % 100 mL IVPB (0 g Intravenous Stopped 08/03/21 2247)  feeding supplement (ENSURE ENLIVE / ENSURE PLUS) liquid 237 mL (has no administration in time range)  hydrALAZINE (APRESOLINE) injection 10 mg (has no administration in time range)  Chlorhexidine Gluconate Cloth 2 % PADS 6 each (has no administration in time range)  clopidogrel (PLAVIX) tablet 75 mg (has no administration in time range)  cyanocobalamin ((VITAMIN B-12)) injection 1,000 mcg (has no administration in time range)    Followed by  vitamin B-12 (CYANOCOBALAMIN) tablet 1,000 mcg (has no administration in time range)  lactated ringers bolus 1,000 mL (0 mLs Intravenous Stopped 08/03/21 1612)  ceFEPIme (MAXIPIME) 2 g in sodium chloride 0.9 % 100 mL IVPB (0 g  Intravenous Stopped 08/03/21 1612)  metroNIDAZOLE (FLAGYL) IVPB 500 mg (0 mg Intravenous Stopped 08/03/21 1826)  vancomycin (VANCOCIN) IVPB 1000 mg/200 mL premix (0 mg Intravenous Stopped 08/03/21 1639)  vancomycin (VANCOCIN) IVPB 1000 mg/200 mL premix (0 mg Intravenous Stopped 08/03/21 1827)  acetaminophen (TYLENOL) tablet 1,000 mg (1,000 mg Oral Given 08/03/21 1703)  lactated ringers bolus 1,000 mL (1,000 mLs  Intravenous New Bag/Given 08/03/21 1902)    ED Course  I have reviewed the triage vital signs and the nursing notes.  Pertinent labs & imaging results that were available during my care of the patient were reviewed by me and considered in my medical decision making (see chart for details).    MDM Rules/Calculators/A&P                           Gregory [name] presents with concern for sepsis as per above.  His exam is most notable for mild somnolence and confusion, AAOx3, sinus tachycardia.  My immediate concern is for a life-threatening infection in this patient. Evaluation and treatment for sepsis began immediately on my evaluation.  The most likely infectious source is respiratory. The patient became mildly hypoxic in the ED requiring 2L O2 via nasal cannula. No meningeal findings on exam. Abdomen is s/nt/nd.  I have a low suspicion for meningitis, osteomyelitis, endocarditis, pyelonephritis. Thorough skin exam revealed no significant open wounds.   When the patient left triage from [facility], he had had blood work drawn and was found to be bacteremic with Klebsiella pneumonia.  Patient has been having chills and rigors at home.  Labs: Initial lactic acid 1.8, WBC elevated with a leukocytosis to 13, hemoglobin anemic to 9, troponin mildly elevated from 95-1 06, likely in the setting of acute illness, elevated BUN to 36, creatinine 1.77 from a baseline of around 1.3 mild metabolic acidosis with a bicarb of 20 without an anion gap.   Imaging: CT chest with bilateral lower lobe bronchial wall thickening and patchy airspace disease consistent with bronchopneumonia.  For treatment, the patient was given 30cc/kg LR for IVF and vancomycin, cefepime and Flagyl for antimicrobials.   Upon reassessment, the patient had yet to defervesced after Tylenol administration.  He remained hypothermic to 103.  Cold packs of ice were placed on the patient.. The patient is not currently hypotensive and  has a MAP of >65. The patient's volume status appears to be improved after fluid resuscitation.  His tachycardia did improve from the 130s down to the high 90s indicating adequate response to volume resuscitation.  Based off the presentation and lab findings, the patient meets criteria for admission for sepsis, likely due to CAP.  [SEVERE SEPSIS] HYPOTENSION (Nursing BPA for hypotension = Is this sepsis?) CREATININE > 2.0, or urine output < 0.5 mL/kg/hour for 2 hours  BILIRUBIN > 2 mg/dL (34.2 mmol/L)  PLATELET COUNT < 100,000  INR > 1.5 or aPTT > 60 sec  LACTATE > 2 mmol/L   [SEPTIC SHOCK] HYPOTENSION REFRACTORY TO 30 cc/kg OF FLUIDS LACTATE > 4 mmol/L  While in the ED, the patient was given: Medications  lactated ringers infusion ( Intravenous Infusion Verify 08/04/21 0900)  insulin aspart (novoLOG) injection 0-9 Units (1 Units Subcutaneous Given 08/04/21 0829)  enoxaparin (LOVENOX) injection 40 mg (40 mg Subcutaneous Given 08/03/21 2206)  acetaminophen (TYLENOL) tablet 650 mg (650 mg Oral Given 08/03/21 2206)    Or  acetaminophen (TYLENOL) suppository 650 mg ( Rectal See Alternative 08/03/21 2206)  cefTRIAXone (ROCEPHIN) 2 g in sodium chloride 0.9 % 100 mL IVPB (0 g Intravenous Stopped 08/03/21 2247)  feeding supplement (ENSURE ENLIVE / ENSURE PLUS) liquid 237 mL (has no administration in time range)  hydrALAZINE (APRESOLINE) injection 10 mg (has no administration in time range)  Chlorhexidine Gluconate Cloth 2 % PADS 6 each (has no administration in time range)  clopidogrel (PLAVIX) tablet 75 mg (has no administration in time range)  cyanocobalamin ((VITAMIN B-12)) injection 1,000 mcg (has no administration in time range)    Followed by  vitamin B-12 (CYANOCOBALAMIN) tablet 1,000 mcg (has no administration in time range)  lactated ringers bolus 1,000 mL (0 mLs Intravenous Stopped 08/03/21 1612)  ceFEPIme (MAXIPIME) 2 g in sodium chloride 0.9 % 100 mL IVPB (0 g Intravenous Stopped  08/03/21 1612)  metroNIDAZOLE (FLAGYL) IVPB 500 mg (0 mg Intravenous Stopped 08/03/21 1826)  vancomycin (VANCOCIN) IVPB 1000 mg/200 mL premix (0 mg Intravenous Stopped 08/03/21 1639)  vancomycin (VANCOCIN) IVPB 1000 mg/200 mL premix (0 mg Intravenous Stopped 08/03/21 1827)  acetaminophen (TYLENOL) tablet 1,000 mg (1,000 mg Oral Given 08/03/21 1703)  lactated ringers bolus 1,000 mL (1,000 mLs Intravenous New Bag/Given 08/03/21 1902)     Gregory [name] was admitted to the hospital for ongoing treatment and monitoring.   Final Clinical Impression(s) / ED Diagnoses Final diagnoses:  Sepsis with acute renal failure without septic shock, due to unspecified organism, unspecified acute renal failure type ([place])  Acute respiratory failure with hypoxia ([place])  Bacteremia    Rx / DC Orders ED Discharge Orders     None        Regan Lemming, MD 08/04/21 1025

## 2021-08-03 NOTE — ED Triage Notes (Signed)
Patient  was seen yesterday [place] because he was laying in the middle of the road with seizure-like activity. Patient states he fell yesterday in the street and states that he "has a little bit of pain on the left side of his back." Patient also has a + blood culture-Enterobacterales and Klebsiella pneumoniae

## 2021-08-03 NOTE — H&P (Signed)
History and Physical    CARMERON HEADY DJM:[id number] DOB: 1951/06/30 DOA: 08/03/2021  PCP: Seward Carol, MD  Patient coming from: Home.  History obtained from patient's sister, patient, previous records and ER physician.  Chief Complaint: Confusion and tremors.  HPI: Gregory [name] is a 70 y.o. male with history of CAD status post stenting, chronic kidney disease stage III, hypertension, diabetes mellitus type 2 was brought to the ER because of increasing confusion and tremors.  Patient was brought to the ER a day ago after patient had a fall while coming off his work and started having tremors.  Patient at the time was found to be confused.  Patient was brought to the ER and had CT head and blood works done but patient signed out [place].  Blood cultures at that time grew Klebsiella pneumoniae (Enterobacteriaceae).  Patient was subsequently found to be more confused with persistent tremors (rigors) as witnessed by patient's sister and was brought back to the ER.  Was finding it difficult to ambulate because of the weakness.  ED Course: In the ER patient had a temperature 103 F tachycardic in the 140s with lab work showing leukocytosis of 13,000 and hemoglobin of 9.9 yesterday it was around 10.2 but about a year and a half ago it was 17.  Patient states he has not noticed any blood in the stools or has not had any bleeding.  He did hurt his right elbow out his fall with small ecchymotic area.  Has been having some cough in the ER but denies any chest pain nausea vomiting diarrhea or abdominal pain.  Abdomen appears distended which patient states is chronic.  CT chest showed bilateral bronchopneumonia.  Patient was started on empiric antibiotics admitted for sepsis with blood cultures growing Klebsiella pneumoniae.  COVID test was negative.  High sensitive troponin was 106 EKG showing sinus tachycardia.  Lactic acid was normal.  Review of Systems: As per HPI, rest all  negative.   Past Medical History:  Diagnosis Date   Arthritis    Cancer ([place])    Coronary artery disease    Gout    Hyperlipidemia    Hypertension    Myocardial infarction ([place]) 05/2012   "mild"   Swelling of left lower extremity    LLE; "happens often"   Type II diabetes mellitus ([place])     Past Surgical History:  Procedure Laterality Date   BACK SURGERY     CORONARY ANGIOPLASTY WITH STENT PLACEMENT  06/05/2012   "2; total of 2"   LEFT HEART CATHETERIZATION WITH CORONARY ANGIOGRAM N/A 06/05/2012   Procedure: LEFT HEART CATHETERIZATION WITH CORONARY ANGIOGRAM;  Surgeon: Jettie Booze, MD;  Location: [facility] CATH LAB;  Service: Cardiovascular;  Laterality: N/A;  possible PCI   LUMBAR DISC SURGERY  1990's   LYMPHADENECTOMY Bilateral 02/25/2014   Procedure: [facility] LYMPH NODE DISSECTION";  Surgeon: Molli Hazard, MD;  Location: [facility] ORS;  Service: Urology;  Laterality: Bilateral;   PROSTATE SURGERY     ROBOT ASSISTED LAPAROSCOPIC RADICAL PROSTATECTOMY N/A 02/25/2014   Procedure: ROBOTIC ASSISTED LAPAROSCOPIC RADICAL  PROSTATECTOMY,  UMBILICAL HERNIA REPAIR;  Surgeon: Molli Hazard, MD;  Location: [facility] ORS;  Service: Urology;  Laterality: N/A;     reports that he quit smoking about 27 years ago. His smoking use included cigarettes. He has a 60.00 pack-year smoking history. He has never used smokeless tobacco. He reports that he does not drink alcohol and does not use drugs.  No  Known Allergies  Family History  Problem Relation Age of Onset   Breast cancer Mother    Asthma Father    Parkinson's disease Father     Prior to Admission medications   Medication Sig Start Date End Date Taking? Authorizing Provider  allopurinol (ZYLOPRIM) 300 MG tablet Take 300 mg by mouth daily as needed (gout).     [provider]  amLODipine-valsartan (EXFORGE) 10-320 MG per tablet Take 1 tablet by mouth daily.    [provider]  atorvastatin (LIPITOR) 10  MG tablet Take 10 mg by mouth daily at 6 PM.  08/23/15   [provider]  clopidogrel (PLAVIX) 75 MG tablet TAKE 1 TABLET (75 MG TOTAL) BY MOUTH DAILY. 10/22/14   Jettie Booze, MD  furosemide (LASIX) 40 MG tablet Take 1 tablet (40 mg total) by mouth daily as needed. For swelling 12/23/20   Jettie Booze, MD  insulin glargine (LANTUS) 100 UNIT/ML injection Inject 0.3 mLs (30 Units total) into the skin every morning. 10/26/19   Georgette Shell, MD  metFORMIN (GLUCOPHAGE) 1000 MG tablet Take 1,000 mg by mouth 2 (two) times daily with a meal.    [provider]  metoprolol tartrate (LOPRESSOR) 25 MG tablet Take 12.5 mg by mouth 2 (two) times daily. 11/01/20   [provider]  nitroGLYCERIN (NITROSTAT) 0.4 MG SL tablet Place 1 tablet (0.4 mg total) under the tongue every 5 (five) minutes as needed for chest pain. 08/21/17   Jettie Booze, MD    Physical Exam: Constitutional: Moderately built and nourished. Vitals:   08/03/21 1845 08/03/21 1900 08/03/21 1930 08/03/21 2000  BP: 117/62 110/71 (!) 114/57 123/60  Pulse: (!) 102 97 92 (!) 105  Resp: (!) 28 (!) 28 (!) 29 (!) 31  Temp: (!) 103.1 F (39.5 C)     TempSrc: Rectal     SpO2: 95% 96% 94% 97%  Weight:      Height:       Eyes: Anicteric no pallor. ENMT: No discharge from the ears eyes nose and mouth. Neck: No mass felt.  No neck rigidity. Respiratory: No rhonchi or crepitations. Cardiovascular: S1-S2 heard. Abdomen: Soft nontender bowel sound present.  Mildly distended. Musculoskeletal: Small ecchymotic area in the right elbow area. Skin: Ecchymotic area in the right elbow. Neurologic: Alert awake oriented to his name and place moving all extremities.   Psychiatric: Oriented to name and place.   Labs on Admission: I have personally reviewed following labs and imaging studies  CBC: Recent Labs  Lab 08/02/21 1843 08/03/21 1528  WBC 13.6* 13.1*  NEUTROABS 12.8* 11.7*  HGB 10.2*  9.9*  HCT 31.3* 30.7*  MCV 92.9 92.7  PLT 206 660   Basic Metabolic Panel: Recent Labs  Lab 08/02/21 1843 08/03/21 1528  NA 136 140  K 4.0 3.8  CL 104 106  CO2 22 20*  GLUCOSE 195* 193*  BUN 44* 47*  CREATININE 2.23* 2.32*  CALCIUM 8.8* 9.2   GFR: Estimated Creatinine Clearance: 35.9 mL/min (A) (by C-G formula based on SCr of 2.32 mg/dL (H)). Liver Function Tests: Recent Labs  Lab 08/02/21 1843 08/03/21 1528  AST 15 43*  ALT 12 23  ALKPHOS 81 111  BILITOT 0.9 0.9  PROT 7.1 7.8  ALBUMIN 3.6 3.7   No results for input(s): LIPASE, AMYLASE in the last 168 hours. No results for input(s): AMMONIA in the last 168 hours. Coagulation Profile: Recent Labs  Lab 08/03/21 1528  INR 1.2  Cardiac Enzymes: No results for input(s): CKTOTAL, CKMB, CKMBINDEX, TROPONINI in the last 168 hours. BNP (last 3 results) No results for input(s): PROBNP in the last 8760 hours. HbA1C: No results for input(s): HGBA1C in the last 72 hours. CBG: No results for input(s): GLUCAP in the last 168 hours. Lipid Profile: No results for input(s): CHOL, HDL, LDLCALC, TRIG, CHOLHDL, LDLDIRECT in the last 72 hours. Thyroid Function Tests: No results for input(s): TSH, T4TOTAL, FREET4, T3FREE, THYROIDAB in the last 72 hours. Anemia Panel: No results for input(s): VITAMINB12, FOLATE, FERRITIN, TIBC, IRON, RETICCTPCT in the last 72 hours. Urine analysis:    Component Value Date/Time   COLORURINE YELLOW 08/03/2021 1636   APPEARANCEUR HAZY (A) 08/03/2021 1636   LABSPEC 1.010 08/03/2021 1636   PHURINE 5.0 08/03/2021 1636   GLUCOSEU NEGATIVE 08/03/2021 1636   HGBUR MODERATE (A) 08/03/2021 1636   BILIRUBINUR NEGATIVE 08/03/2021 1636   KETONESUR 5 (A) 08/03/2021 1636   PROTEINUR 30 (A) 08/03/2021 1636   UROBILINOGEN 1.0 02/07/2015 1409   NITRITE NEGATIVE 08/03/2021 1636   LEUKOCYTESUR NEGATIVE 08/03/2021 1636   Sepsis Labs: @LABRCNTIP (procalcitonin:4,lacticidven:4) ) Recent Results (from the  past 240 hour(s))  Culture, blood (single)     Status: None (Preliminary result)   Collection Time: 08/02/21  6:43 PM   Specimen: BLOOD  Result Value Ref Range Status   Specimen Description BLOOD LEFT ANTECUBITAL  Final   Special Requests   Final    BOTTLES DRAWN AEROBIC AND ANAEROBIC Blood Culture adequate volume   Culture  Setup Time   Final    GRAM NEGATIVE RODS IN BOTH AEROBIC AND ANAEROBIC BOTTLES CRITICAL RESULT CALLED TO, READ BACK BY AND VERIFIED WITH: RN Joyce [facility]berg [facility] JAMIE BAILEY [id number] YC 1448 BY CM Performed at [facility], [place] [address], [place],  [address]    Culture GRAM NEGATIVE RODS  Final   Report Status PENDING  Incomplete  Blood Culture ID Panel (Reflexed)     Status: Abnormal   Collection Time: 08/02/21  6:43 PM  Result Value Ref Range Status   Enterococcus faecalis NOT DETECTED NOT DETECTED Final   Enterococcus Faecium NOT DETECTED NOT DETECTED Final   Listeria monocytogenes NOT DETECTED NOT DETECTED Final   Staphylococcus species NOT DETECTED NOT DETECTED Final   Staphylococcus aureus (BCID) NOT DETECTED NOT DETECTED Final   Staphylococcus epidermidis NOT DETECTED NOT DETECTED Final   Staphylococcus lugdunensis NOT DETECTED NOT DETECTED Final   Streptococcus species NOT DETECTED NOT DETECTED Final   Streptococcus agalactiae NOT DETECTED NOT DETECTED Final   Streptococcus pneumoniae NOT DETECTED NOT DETECTED Final   Streptococcus pyogenes NOT DETECTED NOT DETECTED Final   A.calcoaceticus-baumannii NOT DETECTED NOT DETECTED Final   Bacteroides fragilis NOT DETECTED NOT DETECTED Final   Enterobacterales DETECTED (A) NOT DETECTED Final    Comment: Enterobacterales represent a large order of gram negative bacteria, not a single organism. CRITICAL RESULT CALLED TO, READ BACK BY AND VERIFIED WITH: PHARMD A LAWLESS [id number] AT 1137 AM BY CM    Enterobacter cloacae complex NOT DETECTED NOT DETECTED Final   Escherichia coli NOT DETECTED NOT DETECTED Final    Klebsiella aerogenes NOT DETECTED NOT DETECTED Final   Klebsiella oxytoca NOT DETECTED NOT DETECTED Final   Klebsiella pneumoniae DETECTED (A) NOT DETECTED Final    Comment: CRITICAL RESULT CALLED TO, READ BACK BY AND VERIFIED WITH: PHARMD A LAWLESS [id number] AT 1137 BY CM    Proteus species NOT DETECTED NOT DETECTED Final   Salmonella species NOT DETECTED  NOT DETECTED Final   Serratia marcescens NOT DETECTED NOT DETECTED Final   Haemophilus influenzae NOT DETECTED NOT DETECTED Final   Neisseria meningitidis NOT DETECTED NOT DETECTED Final   Pseudomonas aeruginosa NOT DETECTED NOT DETECTED Final   Stenotrophomonas maltophilia NOT DETECTED NOT DETECTED Final   Candida albicans NOT DETECTED NOT DETECTED Final   Candida auris NOT DETECTED NOT DETECTED Final   Candida glabrata NOT DETECTED NOT DETECTED Final   Candida krusei NOT DETECTED NOT DETECTED Final   Candida parapsilosis NOT DETECTED NOT DETECTED Final   Candida tropicalis NOT DETECTED NOT DETECTED Final   Cryptococcus neoformans/gattii NOT DETECTED NOT DETECTED Final   CTX-M ESBL NOT DETECTED NOT DETECTED Final   Carbapenem resistance IMP NOT DETECTED NOT DETECTED Final   Carbapenem resistance KPC NOT DETECTED NOT DETECTED Final   Carbapenem resistance NDM NOT DETECTED NOT DETECTED Final   Carbapenem resist OXA 48 LIKE NOT DETECTED NOT DETECTED Final   Carbapenem resistance VIM NOT DETECTED NOT DETECTED Final    Comment: Performed at [facility], [address], [place], [place] [address]  Resp Panel by RT-PCR (Flu A&B, Covid) Nasopharyngeal Swab     Status: None   Collection Time: 08/03/21  3:17 PM   Specimen: Nasopharyngeal Swab; Nasopharyngeal(NP) swabs in vial transport medium  Result Value Ref Range Status   SARS Coronavirus 2 by RT PCR NEGATIVE NEGATIVE Final    Comment: (NOTE) SARS-CoV-2 target nucleic acids are NOT DETECTED.  The SARS-CoV-2 RNA is generally detectable in upper respiratory specimens during the  acute phase of infection. The lowest concentration of SARS-CoV-2 viral copies this assay can detect is 138 copies/mL. A negative result does not preclude SARS-Cov-2 infection and should not be used as the sole basis for treatment or other patient management decisions. A negative result may occur with  improper specimen collection/handling, submission of specimen other than nasopharyngeal swab, presence of viral mutation(s) within the areas targeted by this assay, and inadequate number of viral copies(<138 copies/mL). A negative result must be combined with clinical observations, patient history, and epidemiological information. The expected result is Negative.  Fact Sheet for Patients:  EntrepreneurPulse.com.au  Fact Sheet for Healthcare Providers:  IncredibleEmployment.be  This test is no t yet approved or cleared by the [place] FDA and  has been authorized for detection and/or diagnosis of SARS-CoV-2 by FDA under an Emergency Use Authorization (EUA). This EUA will remain  in effect (meaning this test can be used) for the duration of the COVID-19 declaration under Section 564(b)(1) of the Act, 21 U.S.C.section 360bbb-3(b)(1), unless the authorization is terminated  or revoked sooner.       Influenza A by PCR NEGATIVE NEGATIVE Final   Influenza B by PCR NEGATIVE NEGATIVE Final    Comment: (NOTE) The Xpert Xpress SARS-CoV-2/FLU/RSV plus assay is intended as an aid in the diagnosis of influenza from Nasopharyngeal swab specimens and should not be used as a sole basis for treatment. Nasal washings and aspirates are unacceptable for Xpert Xpress SARS-CoV-2/FLU/RSV testing.  Fact Sheet for Patients: EntrepreneurPulse.com.au  Fact Sheet for Healthcare Providers: IncredibleEmployment.be  This test is not yet approved or cleared by the [place] FDA and has been authorized for detection and/or  diagnosis of SARS-CoV-2 by FDA under an Emergency Use Authorization (EUA). This EUA will remain in effect (meaning this test can be used) for the duration of the COVID-19 declaration under Section 564(b)(1) of the Act, 21 U.S.C. section 360bbb-3(b)(1), unless the authorization is terminated or  revoked.  Performed at [facility], [place] [address] [place], [place] [address]      Radiological Exams on Admission: DG Chest 2 View  Result Date: 08/02/2021 CLINICAL DATA:  Fever EXAM: CHEST - 2 VIEW COMPARISON:  Radiograph 10/25/2019 FINDINGS: Unchanged, borderline enlarged cardiac silhouette. There are hazy bibasilar airspace opacities. No large pleural effusion or visible pneumothorax. No acute osseous abnormality. Thoracic spondylosis. Coronary stents. IMPRESSION: Hazy bibasilar airspace opacities, could be developing infection Electronically Signed   By: Maurine Simmering M.D.   On: 08/02/2021 19:31   CT Head Wo Contrast  Result Date: 08/02/2021 CLINICAL DATA:  Found down, seizures EXAM: CT HEAD WITHOUT CONTRAST TECHNIQUE: Contiguous axial images were obtained from the base of the skull through the vertex without intravenous contrast. COMPARISON:  None. FINDINGS: Brain: Scattered nonspecific hypodensities throughout the periventricular white matter most likely related to a chronic small vessel ischemic changes. No evidence of acute infarct or hemorrhage. Lateral ventricles and remaining midline structures are unremarkable. No acute extra-axial fluid collections. No mass effect. Vascular: Extensive atherosclerosis of the internal carotid arteries. No hyperdense vessel. Skull: Normal. Negative for fracture or focal lesion. Sinuses/Orbits: No acute finding. Other: None. IMPRESSION: 1. Scattered white matter hypodensities most consistent with chronic small vessel ischemic change. 2. Otherwise no acute intracranial process. Electronically Signed   By: Randa Ngo M.D.   On: 08/02/2021  21:57   CT Chest Wo Contrast  Result Date: 08/03/2021 CLINICAL DATA:  [place] yesterday, left-sided back pain, abnormal chest x-ray EXAM: CT CHEST WITHOUT CONTRAST TECHNIQUE: Multidetector CT imaging of the chest was performed following the standard protocol without IV contrast. COMPARISON:  Dental 22 FINDINGS: Cardiovascular: Unenhanced imaging of the heart demonstrates mild cardiomegaly with prominent biatrial dilation. No pericardial effusion. There is extensive atherosclerosis of the coronary vasculature. Normal caliber of the thoracic aorta without evidence of aneurysm. Evaluation of the vascular lumen limited without IV contrast. Minimal atherosclerosis. Mediastinum/Nodes: Borderline enlarged right paratracheal lymph nodes measuring up to 13 mm in short axis reference image 29/2. No other pathologic adenopathy. Thyroid, trachea, and esophagus are unremarkable. Lungs/Pleura: There is bilateral lower lobe bronchial wall thickening, with patchy airspace disease consistent with bronchopneumonia. No effusion or pneumothorax. Central airways are patent. Upper Abdomen: No acute abnormality. Musculoskeletal: No acute or destructive bony lesions. Reconstructed images demonstrate no additional findings. IMPRESSION: 1. Bilateral lower lobe bronchial wall thickening and patchy airspace disease consistent with bronchopneumonia. 2. Borderline enlarged right paratracheal lymph node, likely reactive. 3. Cardiomegaly. 4. Aortic Atherosclerosis (ICD10-I70.0). Coronary artery atherosclerosis. Electronically Signed   By: Randa Ngo M.D.   On: 08/03/2021 18:25   DG Chest Port 1 View  Result Date: 08/03/2021 CLINICAL DATA:  Questionable sepsis EXAM: PORTABLE CHEST 1 VIEW COMPARISON:  08/02/2021 FINDINGS: Low lung volumes. Unchanged, borderline enlarged cardiac silhouette, given differences in technique. Redemonstrated hazy bibasilar opacities, without new focal consolidative opacity. No definite pleural effusion  pneumothorax. No acute osseous abnormality. IMPRESSION: Redemonstrated hazy bibasilar airspace opacities, which are nonspecific could represent developing infection. No new focal pulmonary opacity. Electronically Signed   By: Merilyn Baba M.D.   On: 08/03/2021 16:21    EKG: Independently reviewed.  Sinus tachycardia.  Assessment/Plan Principal Problem:   Sepsis ([place]) Active Problems:   Essential hypertension   Chronic diastolic heart failure (HCC)   CAP (community acquired pneumonia)   CAD (coronary artery disease)   ARF (acute renal failure) (HCC)   Anemia    Sepsis likely from bronchopneumonia with blood cultures growing  Klebsiella pneumoniae for which patient is presently on ceftriaxone.  Discussed with pharmacy presently will be keeping patient on ceftriaxone per pharmacy there is no ESBL noticed.  Continue with hydration follow lactic acid levels.  Patient is still febrile and I have ordered cooling blankets and ice packs. Acute encephalopathy likely from sepsis.  At the time of my exam patient is oriented to his name and place.  We will continue to monitor closely. Elevated troponin likely from sepsis.  We will continue to trend cardiac markers.  Continue antiplatelet agents denies any chest pain. Acute on chronic kidney disease stage III last creatinine in 2021 January was 1.3.  Presently it is 2.3.  Hydrate and continue to monitor.  Need to verify home medications.  Patient denies any vomiting or diarrhea.  Does not show any casts. Diabetes mellitus type 2 we will keep patient on sliding scale coverage for now.  Follow CBGs closely.  Home medication needs to be verified. History of hypertension we will keep patient on as needed IV hydralazine for now.  Home medications needs to be verified. Anemia with significant drop in hemoglobin compared to a year and a half ago.  Hemoglobin when compared to yesterday stable.  We will check anemia panel check CBC.  Platelets are normal.  Will trend  CBCs. History of diastolic dysfunction per 2D echo done in 2016.  Presently receiving fluids for sepsis. Mildly elevated LFTs.  Could be from sepsis follow LFTs.  Check acute hepatitis panel with next blood draw.  Since patient appears septic with encephalopathy renal failure will need close monitoring and inpatient status.   No medications needs to be reconciled.   DVT prophylaxis: Lovenox. Code Status: Full code. Family Communication: [name] sister. Disposition Plan: Home when stable. Consults called: None. Admission status: Inpatient.   Rise Patience MD [facility] Pager [phone].  If 7PM-7AM, please contact night-coverage [email or web address] Password [facility]  08/03/2021, 8:19 PM

## 2021-08-03 NOTE — ED Notes (Signed)
Pt left AMA °

## 2021-08-03 NOTE — Progress Notes (Signed)
Bed requested to 1419 at 2028

## 2021-08-03 NOTE — Sepsis Progress Note (Signed)
ELink tracking the Code Sepsis. 

## 2021-08-04 DIAGNOSIS — R7881 Bacteremia: Secondary | ICD-10-CM | POA: Diagnosis not present

## 2021-08-04 DIAGNOSIS — I10 Essential (primary) hypertension: Secondary | ICD-10-CM

## 2021-08-04 DIAGNOSIS — N179 Acute kidney failure, unspecified: Secondary | ICD-10-CM | POA: Diagnosis not present

## 2021-08-04 DIAGNOSIS — J189 Pneumonia, unspecified organism: Secondary | ICD-10-CM | POA: Diagnosis not present

## 2021-08-04 DIAGNOSIS — A419 Sepsis, unspecified organism: Secondary | ICD-10-CM | POA: Diagnosis not present

## 2021-08-04 DIAGNOSIS — R6520 Severe sepsis without septic shock: Secondary | ICD-10-CM

## 2021-08-04 LAB — CBC WITH DIFFERENTIAL/PLATELET
Abs Immature Granulocytes: 0.05 10*3/uL (ref 0.00–0.07)
Basophils Absolute: 0 10*3/uL (ref 0.0–0.1)
Basophils Relative: 0 %
Eosinophils Absolute: 0 10*3/uL (ref 0.0–0.5)
Eosinophils Relative: 0 %
HCT: 28.3 % — ABNORMAL LOW (ref 39.0–52.0)
Hemoglobin: 9 g/dL — ABNORMAL LOW (ref 13.0–17.0)
Immature Granulocytes: 0 %
Lymphocytes Relative: 6 %
Lymphs Abs: 0.8 10*3/uL (ref 0.7–4.0)
MCH: 30 pg (ref 26.0–34.0)
MCHC: 31.8 g/dL (ref 30.0–36.0)
MCV: 94.3 fL (ref 80.0–100.0)
Monocytes Absolute: 1.3 10*3/uL — ABNORMAL HIGH (ref 0.1–1.0)
Monocytes Relative: 10 %
Neutro Abs: 10.8 10*3/uL — ABNORMAL HIGH (ref 1.7–7.7)
Neutrophils Relative %: 84 %
Platelets: 164 10*3/uL (ref 150–400)
RBC: 3 MIL/uL — ABNORMAL LOW (ref 4.22–5.81)
RDW: 14.6 % (ref 11.5–15.5)
WBC: 13 10*3/uL — ABNORMAL HIGH (ref 4.0–10.5)
nRBC: 0 % (ref 0.0–0.2)

## 2021-08-04 LAB — GLUCOSE, CAPILLARY
Glucose-Capillary: 124 mg/dL — ABNORMAL HIGH (ref 70–99)
Glucose-Capillary: 180 mg/dL — ABNORMAL HIGH (ref 70–99)
Glucose-Capillary: 146 mg/dL — ABNORMAL HIGH (ref 70–99)
Glucose-Capillary: 139 mg/dL — ABNORMAL HIGH (ref 70–99)

## 2021-08-04 LAB — COMPREHENSIVE METABOLIC PANEL
ALT: 32 U/L (ref 0–44)
AST: 51 U/L — ABNORMAL HIGH (ref 15–41)
Albumin: 2.9 g/dL — ABNORMAL LOW (ref 3.5–5.0)
Alkaline Phosphatase: 104 U/L (ref 38–126)
Anion gap: 11 (ref 5–15)
BUN: 36 mg/dL — ABNORMAL HIGH (ref 8–23)
CO2: 20 mmol/L — ABNORMAL LOW (ref 22–32)
Calcium: 8.9 mg/dL (ref 8.9–10.3)
Chloride: 109 mmol/L (ref 98–111)
Creatinine, Ser: 1.77 mg/dL — ABNORMAL HIGH (ref 0.61–1.24)
GFR, Estimated: 41 mL/min — ABNORMAL LOW (ref >60)
Glucose, Bld: 144 mg/dL — ABNORMAL HIGH (ref 70–99)
Potassium: 4 mmol/L (ref 3.5–5.1)
Sodium: 140 mmol/L (ref 135–145)
Total Bilirubin: 0.8 mg/dL (ref 0.3–1.2)
Total Protein: 6.2 g/dL — ABNORMAL LOW (ref 6.5–8.1)

## 2021-08-04 LAB — STREP PNEUMONIAE URINARY ANTIGEN: Strep Pneumo Urinary Antigen: NEGATIVE

## 2021-08-04 LAB — TROPONIN I (HIGH SENSITIVITY)
Troponin I (High Sensitivity): 98 ng/L — ABNORMAL HIGH (ref <18)
Troponin I (High Sensitivity): 102 ng/L (ref <18)

## 2021-08-04 MED ORDER — HYDRALAZINE HCL 20 MG/ML IJ SOLN
10.0000 mg | INTRAMUSCULAR | Status: DC | PRN
  Administered 2021-08-06: 10 mg via INTRAVENOUS
  Filled 2021-08-04: qty 1

## 2021-08-04 MED ORDER — CHLORHEXIDINE GLUCONATE CLOTH 2 % EX PADS
6.0000 | MEDICATED_PAD | Freq: Every day | CUTANEOUS | Status: DC
  Administered 2021-08-04 – 2021-08-08 (×5): 6 via TOPICAL

## 2021-08-04 MED ORDER — ACETAMINOPHEN 500 MG PO TABS: 1000.0000 mg | ORAL_TABLET | Freq: Four times a day (QID) | ORAL | Status: DC | PRN

## 2021-08-04 MED ORDER — ACETAMINOPHEN 500 MG PO TABS
1000.0000 mg | ORAL_TABLET | Freq: Once | ORAL | Status: AC
  Administered 2021-08-04: 1000 mg via ORAL
  Filled 2021-08-04: qty 2

## 2021-08-04 MED ORDER — CYANOCOBALAMIN 1000 MCG/ML IJ SOLN
1000.0000 ug | Freq: Every day | INTRAMUSCULAR | Status: AC
  Administered 2021-08-04 – 2021-08-08 (×5): 1000 ug via INTRAMUSCULAR
  Filled 2021-08-04 (×5): qty 1

## 2021-08-04 MED ORDER — ASPIRIN EC 81 MG PO TBEC: 81.0000 mg | DELAYED_RELEASE_TABLET | Freq: Every day | ORAL | Status: DC

## 2021-08-04 MED ORDER — SODIUM CHLORIDE 0.9 % IV SOLN
INTRAVENOUS | Status: DC | PRN
  Administered 2021-08-04: 500 mL via INTRAVENOUS

## 2021-08-04 MED ORDER — CLOPIDOGREL BISULFATE 75 MG PO TABS
75.0000 mg | ORAL_TABLET | Freq: Every day | ORAL | Status: DC
  Administered 2021-08-04 – 2021-08-09 (×6): 75 mg via ORAL
  Filled 2021-08-04 (×7): qty 1

## 2021-08-04 MED ORDER — VITAMIN B-12 1000 MCG PO TABS
1000.0000 ug | ORAL_TABLET | Freq: Every day | ORAL | Status: DC
  Administered 2021-08-09: 1000 ug via ORAL
  Filled 2021-08-04: qty 1

## 2021-08-04 NOTE — Progress Notes (Signed)
At approx 2156 pt HR noted to be elevated on monitor in the 140s and upon entering room pt appeared to be having rigors and was tachypneic in the 40s. Pt temp was 102.6. MD notified, orders placed for STAT lactic and ABG. Pt tempt spiked to 104.4 within the hour. Pt now on cooling blanket w/ rectal temp @ 102.6. Rigors have ceased. Pt resting comfortably other VSS. Will continue to monitor.

## 2021-08-04 NOTE — Progress Notes (Signed)
[facility] PROGRESS NOTE   Gregory [name] HQP:[id number] DOB: 29-Jan-1951 DOA: 08/03/2021  PCP: Seward Carol, MD  Brief History/Interval Summary: 70 y.o. male with history of CAD status post stenting, chronic kidney disease stage III, hypertension, diabetes mellitus type 2 was brought to the ER because of increasing confusion and tremors.  Patient was brought to the ER a day ago after patient had a fall while coming off his work and started having tremors.  Patient at the time was found to be confused.  Patient was brought to the ER and had CT head and blood works done but patient signed out [place].  Blood cultures at that time grew Klebsiella pneumoniae (Enterobacteriaceae).  Patient was subsequently found to be more confused with persistent tremors (rigors) as witnessed by patient's sister and was brought back to the ER.  Was finding it difficult to ambulate because of the weakness.  He was noted to be febrile.  He was hospitalized for further management.  Consultants: None  Procedures: None    Subjective/Interval History: Patient noted to be mildly distracted but he was aware of where he was.  Oriented to year or month.  Denies any abdominal pain, denies any shortness of breath nausea or vomiting.  No dysuria.  No skin rashes.  No joint pains.      Assessment/Plan:  Klebsiella bacteremia/community-acquired pneumonia/sepsis, present on admission Sepsis likely due to bacteremia and pneumonia.  Patient is noted to be febrile and tachycardic.  Noted to have significant rigors.  Had to be admitted to stepdown and placed on cooling blankets.  Temperature has come down.  Lactic acid level was 1.8.  WBCs remains elevated at 13.  TSH normal.  Procalcitonin levels. Remains on ceftriaxone.  Waiting on final sensitivities. Source of bacteremia is most likely pneumonia.  No other source is identified.  His abdomen is benign.  His UA did not suggest infection.  Acute  metabolic encephalopathy Most likely due to acute infection and sepsis.  Seems to be improving gradually.  No obvious focal neurological deficits noted.  CT head showed chronic small vessel disease.  Mildly elevated troponin Most likely due to demand ischemia from sepsis.  Patient does have a history of coronary artery disease and is status post stents in the past.  He is noted to be on aspirin which is being continued.  Denies any chest pain.  Continue to monitor for now.  G shows sinus tachycardia without any obvious ischemic changes.  Acute on chronic kidney disease stage IIIa Baseline creatinine around 1.3.  Presented with creatinine of 2.3.  Likely due to hypovolemia.  Improved with IV hydration to 1.77.  Monitor urine output.  Diabetes mellitus type 2 with kidney complications with chronic kidney disease stage IIIa Patient mentions that he is on insulin at home.  Noted to be on Lantus and metformin according to home medication list.  Monitor CBGs.  SSI.  Coronary artery disease/diastolic dysfunction Followed by Dr. Irish Lack.  Cardiac status seems to be stable.  Denies any chest pain.  Looks like he is on metoprolol, Plavix statin at home.  Essential hypertension Monitor blood pressures closely.  Noted to be on amlodipine valsartan at home.  Normocytic anemia/vitamin B12 deficiency Looks like he had normal hemoglobin last year.  Has experienced significant drop compared to that.  Panel was done which showed ferritin to be 535, TIBC 208, folate 10.9, B12 low at 148. Will start B12 supplementation.  Stool for occult blood.  Will need  further work-up in the outpatient setting.  Mildly abnormal LFTs Possibly due to acute illness.  Continue to monitor.  Obesity Estimated body mass index is 38.47 kg/m as calculated from the following:   Height as of this encounter: 5\' 8"  (1.727 m).   Weight as of this encounter: 114.8 kg.   DVT Prophylaxis: Lovenox Code Status: Full code Family  Communication: Discussed with the patient.  No family at bedside Disposition Plan: Hopefully return home in improved  Status is: Inpatient  Remains inpatient appropriate because:Altered mental status, IV treatments appropriate due to intensity of illness or inability to take PO, and Inpatient level of care appropriate due to severity of illness  Dispo: The patient is from: Home              Anticipated d/c is to: Home              Patient currently is not medically stable to d/c.   Difficult to place patient No      Medications: Scheduled:  aspirin EC  81 mg Oral Daily   Chlorhexidine Gluconate Cloth  6 each Topical Daily   enoxaparin (LOVENOX) injection  40 mg Subcutaneous Q24H   feeding supplement  237 mL Oral BID BM   insulin aspart  0-9 Units Subcutaneous TID WC   Continuous:  cefTRIAXone (ROCEPHIN)  IV Stopped (08/03/21 2244)   lactated ringers 125 mL/hr at 08/04/21 0800   [facility]:ENIDPOEUMPNTI **OR** acetaminophen, hydrALAZINE  Antibiotics: Anti-infectives (From admission, onward)    Start     Dose/Rate Route Frequency Ordered Stop   08/03/21 2200  cefTRIAXone (ROCEPHIN) 2 g in sodium chloride 0.9 % 100 mL IVPB        2 g 200 mL/hr over 30 Minutes Intravenous Every 24 hours 08/03/21 2027 08/08/21 2159   08/03/21 1630  vancomycin (VANCOCIN) IVPB 1000 mg/200 mL premix        1,000 mg 200 mL/hr over 60 Minutes Intravenous  Once 08/03/21 1544 08/03/21 1827   08/03/21 1515  ceFEPIme (MAXIPIME) 2 g in sodium chloride 0.9 % 100 mL IVPB        2 g 200 mL/hr over 30 Minutes Intravenous  Once 08/03/21 1513 08/03/21 1612   08/03/21 1515  metroNIDAZOLE (FLAGYL) IVPB 500 mg        500 mg 100 mL/hr over 60 Minutes Intravenous  Once 08/03/21 1513 08/03/21 1826   08/03/21 1515  vancomycin (VANCOCIN) IVPB 1000 mg/200 mL premix        1,000 mg 200 mL/hr over 60 Minutes Intravenous  Once 08/03/21 1513 08/03/21 1639       Objective:  Vital Signs  Vitals:   08/04/21 0541  08/04/21 0603 08/04/21 0700 08/04/21 0800  BP:   (!) 131/50 (!) 151/56  Pulse: (!) 101  82 76  Resp: (!) 32  (!) 29 (!) 32  Temp: 99.6 F (37.6 C) 100 F (37.8 C)  99.8 F (37.7 C)  TempSrc: Rectal Rectal  Rectal  SpO2: 94%  99% 98%  Weight:      Height:        Intake/Output Summary (Last 24 hours) at 08/04/2021 0852 Last data filed at 08/04/2021 0800 Gross per 24 hour  Intake 4713.41 ml  Output 1100 ml  Net 3613.41 ml   Filed Weights   08/03/21 1433  Weight: 114.8 kg    General appearance: Awake alert.  In no distress.  Mildly distracted Resp: Normal effort at rest.  Coarse breath sounds bilaterally with few  crackles bilateral bases.  Occasional wheezing.  No rhonchi  Cardio: S1-S2 is normal regular.  No S3-S4.  No rubs murmurs or bruit GI: Abdomen is soft.  Nontender nondistended.  Bowel sounds are present normal.  No masses organomegaly Extremities: No edema.  Full range of motion of lower extremities. Neurologic: He is oriented to place year month.  No focal neurological deficits.    Lab Results:  Data Reviewed: I have personally reviewed following labs and imaging studies  CBC: Recent Labs  Lab 08/02/21 1843 08/03/21 1528 08/03/21 2059 08/04/21 0245  WBC 13.6* 13.1* 13.2* 13.0*  NEUTROABS 12.8* 11.7*  --  10.8*  HGB 10.2* 9.9* 9.1* 9.0*  HCT 31.3* 30.7* 28.6* 28.3*  MCV 92.9 92.7 93.8 94.3  PLT 206 188 170 361    Basic Metabolic Panel: Recent Labs  Lab 08/02/21 1843 08/03/21 1528 08/03/21 2059 08/04/21 0245  NA 136 140  --  140  K 4.0 3.8  --  4.0  CL 104 106  --  109  CO2 22 20*  --  20*  GLUCOSE 195* 193*  --  144*  BUN 44* 47*  --  36*  CREATININE 2.23* 2.32* 2.01* 1.77*  CALCIUM 8.8* 9.2  --  8.9    GFR: Estimated Creatinine Clearance: 47.1 mL/min (A) (by C-G formula based on SCr of 1.77 mg/dL (H)).  Liver Function Tests: Recent Labs  Lab 08/02/21 1843 08/03/21 1528 08/04/21 0245  AST 15 43* 51*  ALT 12 23 32  ALKPHOS 81 111  104  BILITOT 0.9 0.9 0.8  PROT 7.1 7.8 6.2*  ALBUMIN 3.6 3.7 2.9*     Coagulation Profile: Recent Labs  Lab 08/03/21 1528  INR 1.2     CBG: Recent Labs  Lab 08/03/21 2253 08/04/21 0725  GLUCAP 124* 124*     Thyroid Function Tests: Recent Labs    08/03/21 2216  TSH 1.242    Anemia Panel: Recent Labs    08/03/21 2059  VITAMINB12 148*  FOLATE 10.9  FERRITIN 535*  TIBC 208*  IRON 9*  RETICCTPCT 1.0    Recent Results (from the past 240 hour(s))  Culture, blood (single)     Status: None (Preliminary result)   Collection Time: 08/02/21  6:43 PM   Specimen: BLOOD  Result Value Ref Range Status   Specimen Description BLOOD LEFT ANTECUBITAL  Final   Special Requests   Final    BOTTLES DRAWN AEROBIC AND ANAEROBIC Blood Culture adequate volume   Culture  Setup Time   Final    GRAM NEGATIVE RODS IN BOTH AEROBIC AND ANAEROBIC BOTTLES CRITICAL RESULT CALLED TO, READ BACK BY AND VERIFIED WITH: RN [facility] JAMIE BAILEY [id number] MG 8676 BY CM Performed at [facility], [place] [address] [place], [place] [address]    Culture GRAM NEGATIVE RODS  Final   Report Status PENDING  Incomplete  Blood Culture ID Panel (Reflexed)     Status: Abnormal   Collection Time: 08/02/21  6:43 PM  Result Value Ref Range Status   Enterococcus faecalis NOT DETECTED NOT DETECTED Final   Enterococcus Faecium NOT DETECTED NOT DETECTED Final   Listeria monocytogenes NOT DETECTED NOT DETECTED Final   Staphylococcus species NOT DETECTED NOT DETECTED Final   Staphylococcus aureus (BCID) NOT DETECTED NOT DETECTED Final   Staphylococcus epidermidis NOT DETECTED NOT DETECTED Final   Staphylococcus lugdunensis NOT DETECTED NOT DETECTED Final   Streptococcus species NOT DETECTED NOT DETECTED Final   Streptococcus agalactiae NOT DETECTED  NOT DETECTED Final   Streptococcus pneumoniae NOT DETECTED NOT DETECTED Final   Streptococcus pyogenes NOT DETECTED NOT DETECTED Final   A.calcoaceticus-baumannii  NOT DETECTED NOT DETECTED Final   Bacteroides fragilis NOT DETECTED NOT DETECTED Final   Enterobacterales DETECTED (A) NOT DETECTED Final    Comment: Enterobacterales represent a large order of gram negative bacteria, not a single organism. CRITICAL RESULT CALLED TO, READ BACK BY AND VERIFIED WITH: PHARMD A LAWLESS [id number] AT 1137 AM BY CM    Enterobacter cloacae complex NOT DETECTED NOT DETECTED Final   Escherichia coli NOT DETECTED NOT DETECTED Final   Klebsiella aerogenes NOT DETECTED NOT DETECTED Final   Klebsiella oxytoca NOT DETECTED NOT DETECTED Final   Klebsiella pneumoniae DETECTED (A) NOT DETECTED Final    Comment: CRITICAL RESULT CALLED TO, READ BACK BY AND VERIFIED WITH: PHARMD A LAWLESS [id number] AT 1137 BY CM    Proteus species NOT DETECTED NOT DETECTED Final   Salmonella species NOT DETECTED NOT DETECTED Final   Serratia marcescens NOT DETECTED NOT DETECTED Final   Haemophilus influenzae NOT DETECTED NOT DETECTED Final   Neisseria meningitidis NOT DETECTED NOT DETECTED Final   Pseudomonas aeruginosa NOT DETECTED NOT DETECTED Final   Stenotrophomonas maltophilia NOT DETECTED NOT DETECTED Final   Candida albicans NOT DETECTED NOT DETECTED Final   Candida auris NOT DETECTED NOT DETECTED Final   Candida glabrata NOT DETECTED NOT DETECTED Final   Candida krusei NOT DETECTED NOT DETECTED Final   Candida parapsilosis NOT DETECTED NOT DETECTED Final   Candida tropicalis NOT DETECTED NOT DETECTED Final   Cryptococcus neoformans/gattii NOT DETECTED NOT DETECTED Final   CTX-M ESBL NOT DETECTED NOT DETECTED Final   Carbapenem resistance IMP NOT DETECTED NOT DETECTED Final   Carbapenem resistance KPC NOT DETECTED NOT DETECTED Final   Carbapenem resistance NDM NOT DETECTED NOT DETECTED Final   Carbapenem resist OXA 48 LIKE NOT DETECTED NOT DETECTED Final   Carbapenem resistance VIM NOT DETECTED NOT DETECTED Final    Comment: Performed at [facility], [address],  [place], [place] [address]  Resp Panel by RT-PCR (Flu A&B, Covid) Nasopharyngeal Swab     Status: None   Collection Time: 08/03/21  3:17 PM   Specimen: Nasopharyngeal Swab; Nasopharyngeal(NP) swabs in vial transport medium  Result Value Ref Range Status   SARS Coronavirus 2 by RT PCR NEGATIVE NEGATIVE Final    Comment: (NOTE) SARS-CoV-2 target nucleic acids are NOT DETECTED.  The SARS-CoV-2 RNA is generally detectable in upper respiratory specimens during the acute phase of infection. The lowest concentration of SARS-CoV-2 viral copies this assay can detect is 138 copies/mL. A negative result does not preclude SARS-Cov-2 infection and should not be used as the sole basis for treatment or other patient management decisions. A negative result may occur with  improper specimen collection/handling, submission of specimen other than nasopharyngeal swab, presence of viral mutation(s) within the areas targeted by this assay, and inadequate number of viral copies(<138 copies/mL). A negative result must be combined with clinical observations, patient history, and epidemiological information. The expected result is Negative.  Fact Sheet for Patients:  EntrepreneurPulse.com.au  Fact Sheet for Healthcare Providers:  IncredibleEmployment.be  This test is no t yet approved or cleared by the [place] FDA and  has been authorized for detection and/or diagnosis of SARS-CoV-2 by FDA under an Emergency Use Authorization (EUA). This EUA will remain  in effect (meaning this test can be used) for the duration of the COVID-19 declaration  under Section 564(b)(1) of the Act, 21 U.S.C.section 360bbb-3(b)(1), unless the authorization is terminated  or revoked sooner.       Influenza A by PCR NEGATIVE NEGATIVE Final   Influenza B by PCR NEGATIVE NEGATIVE Final    Comment: (NOTE) The Xpert Xpress SARS-CoV-2/FLU/RSV plus assay is intended as an aid in the diagnosis of  influenza from Nasopharyngeal swab specimens and should not be used as a sole basis for treatment. Nasal washings and aspirates are unacceptable for Xpert Xpress SARS-CoV-2/FLU/RSV testing.  Fact Sheet for Patients: EntrepreneurPulse.com.au  Fact Sheet for Healthcare Providers: IncredibleEmployment.be  This test is not yet approved or cleared by the [place] FDA and has been authorized for detection and/or diagnosis of SARS-CoV-2 by FDA under an Emergency Use Authorization (EUA). This EUA will remain in effect (meaning this test can be used) for the duration of the COVID-19 declaration under Section 564(b)(1) of the Act, 21 U.S.C. section 360bbb-3(b)(1), unless the authorization is terminated or revoked.  Performed at [facility], [place] [address] [place], [place] [address]   Blood Culture (routine x 2)     Status: None (Preliminary result)   Collection Time: 08/03/21  3:22 PM   Specimen: BLOOD  Result Value Ref Range Status   Specimen Description   Final    BLOOD LEFT ANTECUBITAL Performed at [place] [address], [place], [place] [address]    Special Requests   Final    BOTTLES DRAWN AEROBIC AND ANAEROBIC Blood Culture results may not be optimal due to an inadequate volume of blood received in culture bottles Performed at [place] [address], [place], [place] [address]    Culture   Final    NO GROWTH < 12 HOURS Performed at [place] [address] [place], [place] [address]    Report Status PENDING  Incomplete  Blood Culture (routine x 2)     Status: None (Preliminary result)   Collection Time: 08/03/21  3:26 PM   Specimen: BLOOD RIGHT FOREARM  Result Value Ref Range Status   Specimen Description   Final    BLOOD RIGHT FOREARM Performed at [place] [address] [place], [place] [address]    Special Requests   Final     BOTTLES DRAWN AEROBIC AND ANAEROBIC Blood Culture adequate volume Performed at [place] [address], [place], [place] [address]    Culture   Final    NO GROWTH < 12 HOURS Performed at [place] [address] [place], [place] [address]    Report Status PENDING  Incomplete  MRSA Next Gen by PCR, Nasal     Status: None   Collection Time: 08/03/21  9:06 PM   Specimen: Nasal Mucosa; Nasal Swab  Result Value Ref Range Status   MRSA by PCR Next Gen NOT DETECTED NOT DETECTED Final    Comment: (NOTE) The GeneXpert MRSA Assay (FDA approved for NASAL specimens only), is one component of a comprehensive MRSA colonization surveillance program. It is not intended to diagnose MRSA infection nor to guide or monitor treatment for MRSA infections. Test performance is not FDA approved in patients less than 36 years old. Performed at [facility], [place] [address], [place],  [address]       Radiology Studies: DG Chest 2 View  Result Date: 08/02/2021 CLINICAL DATA:  Fever EXAM: CHEST - 2 VIEW COMPARISON:  Radiograph 10/25/2019 FINDINGS: Unchanged,  borderline enlarged cardiac silhouette. There are hazy bibasilar airspace opacities. No large pleural effusion or visible pneumothorax. No acute osseous abnormality. Thoracic spondylosis. Coronary stents. IMPRESSION: Hazy bibasilar airspace opacities, could be developing infection Electronically Signed   By: Maurine Simmering M.D.   On: 08/02/2021 19:31   CT Head Wo Contrast  Result Date: 08/02/2021 CLINICAL DATA:  Found down, seizures EXAM: CT HEAD WITHOUT CONTRAST TECHNIQUE: Contiguous axial images were obtained from the base of the skull through the vertex without intravenous contrast. COMPARISON:  None. FINDINGS: Brain: Scattered nonspecific hypodensities throughout the periventricular white matter most likely related to a chronic small vessel ischemic changes. No evidence of acute infarct or  hemorrhage. Lateral ventricles and remaining midline structures are unremarkable. No acute extra-axial fluid collections. No mass effect. Vascular: Extensive atherosclerosis of the internal carotid arteries. No hyperdense vessel. Skull: Normal. Negative for fracture or focal lesion. Sinuses/Orbits: No acute finding. Other: None. IMPRESSION: 1. Scattered white matter hypodensities most consistent with chronic small vessel ischemic change. 2. Otherwise no acute intracranial process. Electronically Signed   By: Randa Ngo M.D.   On: 08/02/2021 21:57   CT Chest Wo Contrast  Result Date: 08/03/2021 CLINICAL DATA:  [place] yesterday, left-sided back pain, abnormal chest x-ray EXAM: CT CHEST WITHOUT CONTRAST TECHNIQUE: Multidetector CT imaging of the chest was performed following the standard protocol without IV contrast. COMPARISON:  Dental 22 FINDINGS: Cardiovascular: Unenhanced imaging of the heart demonstrates mild cardiomegaly with prominent biatrial dilation. No pericardial effusion. There is extensive atherosclerosis of the coronary vasculature. Normal caliber of the thoracic aorta without evidence of aneurysm. Evaluation of the vascular lumen limited without IV contrast. Minimal atherosclerosis. Mediastinum/Nodes: Borderline enlarged right paratracheal lymph nodes measuring up to 13 mm in short axis reference image 29/2. No other pathologic adenopathy. Thyroid, trachea, and esophagus are unremarkable. Lungs/Pleura: There is bilateral lower lobe bronchial wall thickening, with patchy airspace disease consistent with bronchopneumonia. No effusion or pneumothorax. Central airways are patent. Upper Abdomen: No acute abnormality. Musculoskeletal: No acute or destructive bony lesions. Reconstructed images demonstrate no additional findings. IMPRESSION: 1. Bilateral lower lobe bronchial wall thickening and patchy airspace disease consistent with bronchopneumonia. 2. Borderline enlarged right paratracheal lymph node,  likely reactive. 3. Cardiomegaly. 4. Aortic Atherosclerosis (ICD10-I70.0). Coronary artery atherosclerosis. Electronically Signed   By: Randa Ngo M.D.   On: 08/03/2021 18:25   DG Chest Port 1 View  Result Date: 08/03/2021 CLINICAL DATA:  Questionable sepsis EXAM: PORTABLE CHEST 1 VIEW COMPARISON:  08/02/2021 FINDINGS: Low lung volumes. Unchanged, borderline enlarged cardiac silhouette, given differences in technique. Redemonstrated hazy bibasilar opacities, without new focal consolidative opacity. No definite pleural effusion pneumothorax. No acute osseous abnormality. IMPRESSION: Redemonstrated hazy bibasilar airspace opacities, which are nonspecific could represent developing infection. No new focal pulmonary opacity. Electronically Signed   By: Merilyn Baba M.D.   On: 08/03/2021 16:21       LOS: 1 day   [facility] Pager on [email or web address]  08/04/2021, 8:52 AM

## 2021-08-05 DIAGNOSIS — J189 Pneumonia, unspecified organism: Secondary | ICD-10-CM | POA: Diagnosis not present

## 2021-08-05 DIAGNOSIS — A419 Sepsis, unspecified organism: Secondary | ICD-10-CM | POA: Diagnosis not present

## 2021-08-05 DIAGNOSIS — N179 Acute kidney failure, unspecified: Secondary | ICD-10-CM | POA: Diagnosis not present

## 2021-08-05 DIAGNOSIS — R7881 Bacteremia: Secondary | ICD-10-CM | POA: Diagnosis not present

## 2021-08-05 LAB — CBC
HCT: 27.3 % — ABNORMAL LOW (ref 39.0–52.0)
Hemoglobin: 8.8 g/dL — ABNORMAL LOW (ref 13.0–17.0)
MCH: 30.2 pg (ref 26.0–34.0)
MCHC: 32.2 g/dL (ref 30.0–36.0)
MCV: 93.8 fL (ref 80.0–100.0)
Platelets: 155 10*3/uL (ref 150–400)
RBC: 2.91 MIL/uL — ABNORMAL LOW (ref 4.22–5.81)
RDW: 14.7 % (ref 11.5–15.5)
WBC: 8.6 10*3/uL (ref 4.0–10.5)
nRBC: 0 % (ref 0.0–0.2)

## 2021-08-05 LAB — CULTURE, BLOOD (SINGLE): Special Requests: ADEQUATE

## 2021-08-05 LAB — COMPREHENSIVE METABOLIC PANEL
ALT: 48 U/L — ABNORMAL HIGH (ref 0–44)
AST: 56 U/L — ABNORMAL HIGH (ref 15–41)
Albumin: 2.9 g/dL — ABNORMAL LOW (ref 3.5–5.0)
Alkaline Phosphatase: 182 U/L — ABNORMAL HIGH (ref 38–126)
Anion gap: 7 (ref 5–15)
BUN: 31 mg/dL — ABNORMAL HIGH (ref 8–23)
CO2: 24 mmol/L (ref 22–32)
Calcium: 8.2 mg/dL — ABNORMAL LOW (ref 8.9–10.3)
Chloride: 105 mmol/L (ref 98–111)
Creatinine, Ser: 1.39 mg/dL — ABNORMAL HIGH (ref 0.61–1.24)
GFR, Estimated: 54 mL/min — ABNORMAL LOW (ref >60)
Glucose, Bld: 127 mg/dL — ABNORMAL HIGH (ref 70–99)
Potassium: 4.1 mmol/L (ref 3.5–5.1)
Sodium: 136 mmol/L (ref 135–145)
Total Bilirubin: 0.8 mg/dL (ref 0.3–1.2)
Total Protein: 6 g/dL — ABNORMAL LOW (ref 6.5–8.1)

## 2021-08-05 LAB — LEGIONELLA PNEUMOPHILA SEROGP 1 UR AG: L. pneumophila Serogp 1 Ur Ag: NEGATIVE

## 2021-08-05 LAB — CULTURE, BLOOD (ROUTINE X 2): Special Requests: ADEQUATE

## 2021-08-05 LAB — URINE CULTURE: Culture: 10000 — AB

## 2021-08-05 LAB — PROCALCITONIN: Procalcitonin: 62.09 ng/mL

## 2021-08-05 LAB — GLUCOSE, CAPILLARY
Glucose-Capillary: 118 mg/dL — ABNORMAL HIGH (ref 70–99)
Glucose-Capillary: 202 mg/dL — ABNORMAL HIGH (ref 70–99)
Glucose-Capillary: 107 mg/dL — ABNORMAL HIGH (ref 70–99)
Glucose-Capillary: 109 mg/dL — ABNORMAL HIGH (ref 70–99)

## 2021-08-05 MED ORDER — GLUCERNA SHAKE PO LIQD
237.0000 mL | Freq: Two times a day (BID) | ORAL | Status: DC
  Administered 2021-08-05 – 2021-08-08 (×4): 237 mL via ORAL
  Filled 2021-08-05 (×9): qty 237

## 2021-08-05 MED ORDER — PROSOURCE PLUS PO LIQD
30.0000 mL | Freq: Two times a day (BID) | ORAL | Status: DC
  Administered 2021-08-05 – 2021-08-07 (×5): 30 mL via ORAL
  Filled 2021-08-05 (×6): qty 30

## 2021-08-05 MED ORDER — CEFAZOLIN SODIUM-DEXTROSE 2-4 GM/100ML-% IV SOLN
2.0000 g | Freq: Three times a day (TID) | INTRAVENOUS | Status: DC
  Administered 2021-08-05 – 2021-08-08 (×8): 2 g via INTRAVENOUS
  Filled 2021-08-05 (×11): qty 100

## 2021-08-05 MED ORDER — ADULT MULTIVITAMIN W/MINERALS CH
1.0000 | ORAL_TABLET | Freq: Every day | ORAL | Status: DC
  Administered 2021-08-05 – 2021-08-09 (×5): 1 via ORAL
  Filled 2021-08-05 (×5): qty 1

## 2021-08-05 MED ORDER — CEFAZOLIN SODIUM-DEXTROSE 2-4 GM/100ML-% IV SOLN: 2.0000 g | Freq: Three times a day (TID) | INTRAVENOUS | Status: DC

## 2021-08-05 MED ORDER — DOCUSATE SODIUM 100 MG PO CAPS
100.0000 mg | ORAL_CAPSULE | Freq: Every day | ORAL | Status: DC
  Administered 2021-08-05 – 2021-08-09 (×5): 100 mg via ORAL
  Filled 2021-08-05 (×5): qty 1

## 2021-08-05 NOTE — Progress Notes (Signed)
Report given to RN on 6th floor. Patient transferring to 1606.

## 2021-08-05 NOTE — Evaluation (Signed)
Physical Therapy Evaluation Patient Details Name: Gregory [name] MRN: [id number] DOB: [birth year] Today's Date: 08/05/2021  History of Present Illness  Gregory [name] is a 70 y.o. male presents with increasing confusion and tremors. Pt admitted with bacteremia and pneumonia. PMH: CAD s/p stenting, chronic kidney disease stage III, HTN, diabetes   Clinical Impression  Pt admitted with above diagnosis. Pt reports living home alone, independent, works 2 jobs, Medical sales representative in basement, has medical equipment if needed from deceased spouse, has children who check on him. Pt currently unbalanced without RW requiring min A to steady, improves to min guard with RW, ambulated 150 ft with RW ,min guard for safety. Pt denies pain, dizziness, lightheadedness or SOB and no tremors noted with ambulation- RN notified of session. Pt currently with functional limitations due to the deficits listed below (see PT Problem List). Pt will benefit from skilled PT to increase their independence and safety with mobility to allow discharge to the venue listed below.          Recommendations for follow up therapy are one component of a multi-disciplinary discharge planning process, led by the attending physician.  Recommendations may be updated based on patient status, additional functional criteria and insurance authorization.  Follow Up Recommendations Home health PT;Supervision - Intermittent    Equipment Recommendations  None recommended by PT    Recommendations for Other Services       Precautions / Restrictions Precautions Precautions: Fall Restrictions Weight Bearing Restrictions: No      Mobility  Bed Mobility  General bed mobility comments: in recliner    Transfers Overall transfer level: Needs assistance Equipment used: None;Rolling walker (2 wheeled) Transfers: Sit to/from Stand Sit to Stand: Min assist;Min guard  General transfer comment: initial STS with posterior weight requiring min A to  steady and bring weight anterior, following STS with RW min guard for steadying  Ambulation/Gait Ambulation/Gait assistance: Min guard Gait Distance (Feet): 150 Feet Assistive device: Rolling walker (2 wheeled) Gait Pattern/deviations: Step-through pattern;Decreased stride length Gait velocity: decreased   General Gait Details: step through pattern with RW, bil dorsiflexion noted throughout gait cycle with toes elevated off of floor, no LOB and denies complaints  Stairs            Wheelchair Mobility    Modified Rankin (Stroke Patients Only)       Balance Overall balance assessment: Needs assistance  Standing balance support: During functional activity;Bilateral upper extremity supported Standing balance-Leahy Scale: Poor Standing balance comment: reliant on UE support        Pertinent Vitals/Pain Pain Assessment: No/denies pain    Home Living Family/patient expects to be discharged to:: Private residence Living Arrangements: Alone Available Help at Discharge: Family;Friend(s);Available PRN/intermittently Type of Home: House Home Access: Ramped entrance;Stairs to enter Entrance Stairs-Rails: Right;Left Entrance Stairs-Number of Steps: 5 Home Layout: Two level;Able to live on main level with bedroom/bathroom;Laundry or work area in [place]: Environmental consultant - 2 wheels;Wheelchair - manual;Shower seat - built in;Grab bars - tub/shower Additional Comments: Pt's house is well equipped and has DME needs from deceased spouse    Prior Function Level of Independence: Independent  Comments: 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 independent, drives, works 2 jobs Archivist)     Journalist, newspaper   Dominant Hand: Right    Extremity/Trunk Assessment   Upper Extremity Assessment Upper Extremity Assessment: Defer to OT evaluation    Lower Extremity Assessment Lower Extremity Assessment: Overall WFL for tasks assessed (AROM WNL, strength grossly 4-/5, denies  numbness/tingling)    Cervical / Trunk Assessment Cervical / Trunk Assessment: Normal  Communication   Communication: No difficulties  Cognition Arousal/Alertness: Awake/alert Behavior During Therapy: WFL for tasks assessed/performed Overall Cognitive Status: Within Functional Limits for tasks assessed     General Comments      Exercises     Assessment/Plan    PT Assessment Patient needs continued PT services  PT Problem List Decreased strength;Decreased activity tolerance;Decreased balance;Obesity       PT Treatment Interventions DME instruction;Gait training;Stair training;Functional mobility training;Therapeutic activities;Therapeutic exercise;Balance training;Patient/family education    PT Goals (Current goals can be found in the Care Plan section)  Acute Rehab PT Goals Patient Stated Goal: return home PT Goal Formulation: With patient Time For Goal Achievement: 08/19/21 Potential to Achieve Goals: Good    Frequency Min 3X/week   Barriers to discharge        Co-evaluation               AM-PAC PT "6 Clicks" Mobility  Outcome Measure Help needed turning from your back to your side while in a flat bed without using bedrails?: A Little Help needed moving from lying on your back to sitting on the side of a flat bed without using bedrails?: A Little Help needed moving to and from a bed to a chair (including a wheelchair)?: A Little Help needed standing up from a chair using your arms (e.g., wheelchair or bedside chair)?: A Little Help needed to walk in hospital room?: A Little Help needed climbing 3-5 steps with a railing? : A Lot 6 Click Score: 17    End of Session Equipment Utilized During Treatment: Gait belt Activity Tolerance: Patient tolerated treatment well Patient left: in chair;with call bell/phone within reach;with chair alarm set Nurse Communication: Mobility status PT Visit Diagnosis: Unsteadiness on feet (R26.81);Other abnormalities of gait and  mobility (R26.89)    Time: 5400-8676 PT Time Calculation (min) (ACUTE ONLY): 19 min   Charges:   PT Evaluation $PT Eval Low Complexity: 1 Low           Tori Samona Chihuahua PT, DPT 08/05/21, 3:03 PM

## 2021-08-05 NOTE — Progress Notes (Signed)
[facility] PROGRESS NOTE   Gregory [name] IZT:[id number] DOB: 1951-10-15 DOA: 08/03/2021  PCP: Seward Carol, MD  Brief History/Interval Summary: 70 y.o. male with history of CAD status post stenting, chronic kidney disease stage III, hypertension, diabetes mellitus type 2 was brought to the ER because of increasing confusion and tremors.  Patient was brought to the ER a day ago after patient had a fall while coming off his work and started having tremors.  Patient at the time was found to be confused.  Patient was brought to the ER and had CT head and blood works done but patient signed out [place].  Blood cultures at that time grew Klebsiella pneumoniae (Enterobacteriaceae).  Patient was subsequently found to be more confused with persistent tremors (rigors) as witnessed by patient's sister and was brought back to the ER.  Was finding it difficult to ambulate because of the weakness.  He was noted to be febrile.  He was hospitalized for further management.  Consultants: None  Procedures: None    Subjective/Interval History: Patient noted to be much more awake and alert today.  He states that he is feeling better.  Denies any shortness of breath or cough.  No nausea vomiting.  No abdominal pain.  Denies any discomfort with urinating.       Assessment/Plan:  Klebsiella bacteremia/community-acquired pneumonia/sepsis, present on admission Sepsis likely due to bacteremia and pneumonia.  Patient is noted to be febrile and tachycardic.  Noted to have significant rigors.  Had to be admitted to stepdown and placed on cooling blankets.   Fever appears to be subsiding.  Procalcitonin significantly elevated at 62.09.  WBC is normal today.  Lactic acid level was 1.8. He remains on ceftriaxone.  Waiting on final sensitivities.   Source of bacteremia is most likely pneumonia.  No other source is identified.  His UA did not suggest infection.  Acute metabolic  encephalopathy Most likely due to acute infection and sepsis.  No obvious focal neurological deficits noted.  CT head showed chronic small vessel disease. Seems to be back to baseline now.  Mildly elevated troponin Most likely due to demand ischemia from sepsis.  Patient does have a history of coronary artery disease and is status post stents in the past.  He is noted to be on aspirin which is being continued.  He remains asymptomatic.  Continue to monitor for now.  ECG shows sinus tachycardia without any obvious ischemic changes.  Acute on chronic kidney disease stage IIIa Baseline creatinine around 1.3.  Presented with creatinine of 2.3.  Likely due to hypovolemia.  Improved to 1.39 today with IV hydration.  Encourage oral intake.  Monitor urine output.  Recheck labs tomorrow.  Diabetes mellitus type 2 with kidney complications with chronic kidney disease stage IIIa Patient mentions that he is on insulin at home.  Noted to be on Lantus and metformin according to home medication list.   Continue to monitor CBGs.  No recent HbA1c in our system.  Last 1 was 7.0 in November 2021.  Coronary artery disease/diastolic dysfunction Followed by Dr. Irish Lack.  Cardiac status seems to be stable.  Looks like he is on metoprolol, Plavix statin at home.  Plavix has been resumed.  Metoprolol and statin on hold.  Essential hypertension Blood pressure reasonably well controlled.  Amlodipine valsartan currently on hold.   Normocytic anemia/vitamin B12 deficiency Looks like he had normal hemoglobin last year.  Has experienced significant drop compared to that.  Anemia panel was  done which showed ferritin to be 535, TIBC 208, folate 10.9, B12 low at 148. Started on B12 supplementation. He tells me today that he had a colonoscopy done by Dr. Benson Norway within the last 2 months which was unremarkable.  We will recommend that his hemoglobin be checked in few weeks time and if it still low he may benefit from being  referred to hematology.    Mildly abnormal LFTs Possibly due to acute illness.  More or less stable.  Continue to monitor.  Continue to hold statin for now.  Could be resumed if there is no significant rise in his LFTs.  Obesity Estimated body mass index is 38.47 kg/m as calculated from the following:   Height as of this encounter: 5\' 8"  (1.727 m).   Weight as of this encounter: 114.8 kg.   DVT Prophylaxis: Lovenox Code Status: Full code Family Communication: Discussed with the patient.  Discussed with the sister yesterday. Disposition Plan: Hopefully return home when improved.  Start mobilizing.  Okay for transfer to floor today.  Status is: Inpatient  Remains inpatient appropriate because:Altered mental status, IV treatments appropriate due to intensity of illness or inability to take PO, and Inpatient level of care appropriate due to severity of illness  Dispo: The patient is from: Home              Anticipated d/c is to: Home              Patient currently is not medically stable to d/c.   Difficult to place patient No      Medications: Scheduled:  Chlorhexidine Gluconate Cloth  6 each Topical Daily   clopidogrel  75 mg Oral Daily   cyanocobalamin  1,000 mcg Intramuscular Daily   Followed by   Derrill Memo ON 08/09/2021] vitamin B-12  1,000 mcg Oral Daily   docusate sodium  100 mg Oral Daily   enoxaparin (LOVENOX) injection  40 mg Subcutaneous Q24H   feeding supplement  237 mL Oral BID BM   insulin aspart  0-9 Units Subcutaneous TID WC   Continuous:  sodium chloride 10 mL/hr at 08/05/21 0600   cefTRIAXone (ROCEPHIN)  IV Stopped (08/04/21 2136)   JHE:RDEYCX chloride, acetaminophen **OR** acetaminophen, hydrALAZINE  Antibiotics: Anti-infectives (From admission, onward)    Start     Dose/Rate Route Frequency Ordered Stop   08/03/21 2200  cefTRIAXone (ROCEPHIN) 2 g in sodium chloride 0.9 % 100 mL IVPB        2 g 200 mL/hr over 30 Minutes Intravenous Every 24 hours  08/03/21 2027 08/08/21 2159   08/03/21 1630  vancomycin (VANCOCIN) IVPB 1000 mg/200 mL premix        1,000 mg 200 mL/hr over 60 Minutes Intravenous  Once 08/03/21 1544 08/03/21 1827   08/03/21 1515  ceFEPIme (MAXIPIME) 2 g in sodium chloride 0.9 % 100 mL IVPB        2 g 200 mL/hr over 30 Minutes Intravenous  Once 08/03/21 1513 08/03/21 1612   08/03/21 1515  metroNIDAZOLE (FLAGYL) IVPB 500 mg        500 mg 100 mL/hr over 60 Minutes Intravenous  Once 08/03/21 1513 08/03/21 1826   08/03/21 1515  vancomycin (VANCOCIN) IVPB 1000 mg/200 mL premix        1,000 mg 200 mL/hr over 60 Minutes Intravenous  Once 08/03/21 1513 08/03/21 1639       Objective:  Vital Signs  Vitals:   08/05/21 0500 08/05/21 0600 08/05/21 0800 08/05/21 0818  BP:  140/61 137/64 128/68   Pulse: 75 84 92   Resp: 14 (!) 31 (!) 22   Temp:    98.6 F (37 C)  TempSrc:    Oral  SpO2: 99% 99% 96%   Weight:      Height:        Intake/Output Summary (Last 24 hours) at 08/05/2021 0949 Last data filed at 08/05/2021 0630 Gross per 24 hour  Intake 2323.54 ml  Output 2150 ml  Net 173.54 ml    Filed Weights   08/03/21 1433  Weight: 114.8 kg    General appearance: Awake alert.  In no distress Resp: Crackles bilateral bases.  Few scattered rhonchi.  No wheezing.  Normal effort at rest. Cardio: S1-S2 is normal regular.  No S3-S4.  No rubs murmurs or bruit GI: Abdomen is soft.  Nontender nondistended.  Bowel sounds are present normal.  No masses organomegaly Extremities: No edema.  Full range of motion of lower extremities. Neurologic:   No focal neurological deficits.     Lab Results:  Data Reviewed: I have personally reviewed following labs and imaging studies  CBC: Recent Labs  Lab 08/02/21 1843 08/03/21 1528 08/03/21 2059 08/04/21 0245 08/05/21 0250  WBC 13.6* 13.1* 13.2* 13.0* 8.6  NEUTROABS 12.8* 11.7*  --  10.8*  --   HGB 10.2* 9.9* 9.1* 9.0* 8.8*  HCT 31.3* 30.7* 28.6* 28.3* 27.3*  MCV 92.9  92.7 93.8 94.3 93.8  PLT 206 188 170 164 155     Basic Metabolic Panel: Recent Labs  Lab 08/02/21 1843 08/03/21 1528 08/03/21 2059 08/04/21 0245 08/05/21 0250  NA 136 140  --  140 136  K 4.0 3.8  --  4.0 4.1  CL 104 106  --  109 105  CO2 22 20*  --  20* 24  GLUCOSE 195* 193*  --  144* 127*  BUN 44* 47*  --  36* 31*  CREATININE 2.23* 2.32* 2.01* 1.77* 1.39*  CALCIUM 8.8* 9.2  --  8.9 8.2*     GFR: Estimated Creatinine Clearance: 60 mL/min (A) (by C-G formula based on SCr of 1.39 mg/dL (H)).  Liver Function Tests: Recent Labs  Lab 08/02/21 1843 08/03/21 1528 08/04/21 0245 08/05/21 0250  AST 15 43* 51* 56*  ALT 12 23 32 48*  ALKPHOS 81 111 104 182*  BILITOT 0.9 0.9 0.8 0.8  PROT 7.1 7.8 6.2* 6.0*  ALBUMIN 3.6 3.7 2.9* 2.9*      Coagulation Profile: Recent Labs  Lab 08/03/21 1528  INR 1.2      CBG: Recent Labs  Lab 08/03/21 2253 08/04/21 0725 08/04/21 1211 08/04/21 1619 08/04/21 2057  GLUCAP 124* 124* 180* 146* 139*      Thyroid Function Tests: Recent Labs    08/03/21 2216  TSH 1.242     Anemia Panel: Recent Labs    08/03/21 2059  VITAMINB12 148*  FOLATE 10.9  FERRITIN 535*  TIBC 208*  IRON 9*  RETICCTPCT 1.0     Recent Results (from the past 240 hour(s))  Culture, blood (single)     Status: Abnormal   Collection Time: 08/02/21  6:43 PM   Specimen: BLOOD  Result Value Ref Range Status   Specimen Description BLOOD LEFT ANTECUBITAL  Final   Special Requests   Final    BOTTLES DRAWN AEROBIC AND ANAEROBIC Blood Culture adequate volume   Culture  Setup Time   Final    GRAM NEGATIVE RODS IN BOTH AEROBIC AND ANAEROBIC BOTTLES CRITICAL RESULT  CALLED TO, READ BACK BY AND VERIFIED WITH: RN [facility] JAMIE BAILEY [id number] AT 0865 BY CM Performed at [facility], [place] [address] [place], [place] [address]    Culture KLEBSIELLA PNEUMONIAE (A)  Final   Report Status 08/05/2021 FINAL  Final   Organism ID, Bacteria KLEBSIELLA  PNEUMONIAE  Final      Susceptibility   Klebsiella pneumoniae - MIC*    AMPICILLIN >=32 RESISTANT Resistant     CEFAZOLIN <=4 SENSITIVE Sensitive     CEFEPIME <=0.12 SENSITIVE Sensitive     CEFTAZIDIME <=1 SENSITIVE Sensitive     CEFTRIAXONE <=0.25 SENSITIVE Sensitive     CIPROFLOXACIN <=0.25 SENSITIVE Sensitive     GENTAMICIN <=1 SENSITIVE Sensitive     IMIPENEM 0.5 SENSITIVE Sensitive     TRIMETH/SULFA <=20 SENSITIVE Sensitive     AMPICILLIN/SULBACTAM 8 SENSITIVE Sensitive     PIP/TAZO <=4 SENSITIVE Sensitive     * KLEBSIELLA PNEUMONIAE  Blood Culture ID Panel (Reflexed)     Status: Abnormal   Collection Time: 08/02/21  6:43 PM  Result Value Ref Range Status   Enterococcus faecalis NOT DETECTED NOT DETECTED Final   Enterococcus Faecium NOT DETECTED NOT DETECTED Final   Listeria monocytogenes NOT DETECTED NOT DETECTED Final   Staphylococcus species NOT DETECTED NOT DETECTED Final   Staphylococcus aureus (BCID) NOT DETECTED NOT DETECTED Final   Staphylococcus epidermidis NOT DETECTED NOT DETECTED Final   Staphylococcus lugdunensis NOT DETECTED NOT DETECTED Final   Streptococcus species NOT DETECTED NOT DETECTED Final   Streptococcus agalactiae NOT DETECTED NOT DETECTED Final   Streptococcus pneumoniae NOT DETECTED NOT DETECTED Final   Streptococcus pyogenes NOT DETECTED NOT DETECTED Final   A.calcoaceticus-baumannii NOT DETECTED NOT DETECTED Final   Bacteroides fragilis NOT DETECTED NOT DETECTED Final   Enterobacterales DETECTED (A) NOT DETECTED Final    Comment: Enterobacterales represent a large order of gram negative bacteria, not a single organism. CRITICAL RESULT CALLED TO, READ BACK BY AND VERIFIED WITH: PHARMD A LAWLESS [id number] AT 1137 AM BY CM    Enterobacter cloacae complex NOT DETECTED NOT DETECTED Final   Escherichia coli NOT DETECTED NOT DETECTED Final   Klebsiella aerogenes NOT DETECTED NOT DETECTED Final   Klebsiella oxytoca NOT DETECTED NOT DETECTED Final    Klebsiella pneumoniae DETECTED (A) NOT DETECTED Final    Comment: CRITICAL RESULT CALLED TO, READ BACK BY AND VERIFIED WITH: PHARMD A LAWLESS [id number] AT 1137 BY CM    Proteus species NOT DETECTED NOT DETECTED Final   Salmonella species NOT DETECTED NOT DETECTED Final   Serratia marcescens NOT DETECTED NOT DETECTED Final   Haemophilus influenzae NOT DETECTED NOT DETECTED Final   Neisseria meningitidis NOT DETECTED NOT DETECTED Final   Pseudomonas aeruginosa NOT DETECTED NOT DETECTED Final   Stenotrophomonas maltophilia NOT DETECTED NOT DETECTED Final   Candida albicans NOT DETECTED NOT DETECTED Final   Candida auris NOT DETECTED NOT DETECTED Final   Candida glabrata NOT DETECTED NOT DETECTED Final   Candida krusei NOT DETECTED NOT DETECTED Final   Candida parapsilosis NOT DETECTED NOT DETECTED Final   Candida tropicalis NOT DETECTED NOT DETECTED Final   Cryptococcus neoformans/gattii NOT DETECTED NOT DETECTED Final   CTX-M ESBL NOT DETECTED NOT DETECTED Final   Carbapenem resistance IMP NOT DETECTED NOT DETECTED Final   Carbapenem resistance KPC NOT DETECTED NOT DETECTED Final   Carbapenem resistance NDM NOT DETECTED NOT DETECTED Final   Carbapenem resist OXA 48 LIKE NOT DETECTED NOT DETECTED Final  Carbapenem resistance VIM NOT DETECTED NOT DETECTED Final    Comment: Performed at [facility], [place] [address], [place], [place] [address]  Resp Panel by RT-PCR (Flu A&B, Covid) Nasopharyngeal Swab     Status: None   Collection Time: 08/03/21  3:17 PM   Specimen: Nasopharyngeal Swab; Nasopharyngeal(NP) swabs in vial transport medium  Result Value Ref Range Status   SARS Coronavirus 2 by RT PCR NEGATIVE NEGATIVE Final    Comment: (NOTE) SARS-CoV-2 target nucleic acids are NOT DETECTED.  The SARS-CoV-2 RNA is generally detectable in upper respiratory specimens during the acute phase of infection. The lowest concentration of SARS-CoV-2 viral copies this assay can detect is 138  copies/mL. A negative result does not preclude SARS-Cov-2 infection and should not be used as the sole basis for treatment or other patient management decisions. A negative result may occur with  improper specimen collection/handling, submission of specimen other than nasopharyngeal swab, presence of viral mutation(s) within the areas targeted by this assay, and inadequate number of viral copies(<138 copies/mL). A negative result must be combined with clinical observations, patient history, and epidemiological information. The expected result is Negative.  Fact Sheet for Patients:  EntrepreneurPulse.com.au  Fact Sheet for Healthcare Providers:  IncredibleEmployment.be  This test is no t yet approved or cleared by the [place] FDA and  has been authorized for detection and/or diagnosis of SARS-CoV-2 by FDA under an Emergency Use Authorization (EUA). This EUA will remain  in effect (meaning this test can be used) for the duration of the COVID-19 declaration under Section 564(b)(1) of the Act, 21 U.S.C.section 360bbb-3(b)(1), unless the authorization is terminated  or revoked sooner.       Influenza A by PCR NEGATIVE NEGATIVE Final   Influenza B by PCR NEGATIVE NEGATIVE Final    Comment: (NOTE) The Xpert Xpress SARS-CoV-2/FLU/RSV plus assay is intended as an aid in the diagnosis of influenza from Nasopharyngeal swab specimens and should not be used as a sole basis for treatment. Nasal washings and aspirates are unacceptable for Xpert Xpress SARS-CoV-2/FLU/RSV testing.  Fact Sheet for Patients: EntrepreneurPulse.com.au  Fact Sheet for Healthcare Providers: IncredibleEmployment.be  This test is not yet approved or cleared by the [place] FDA and has been authorized for detection and/or diagnosis of SARS-CoV-2 by FDA under an Emergency Use Authorization (EUA). This EUA will remain in effect (meaning  this test can be used) for the duration of the COVID-19 declaration under Section 564(b)(1) of the Act, 21 U.S.C. section 360bbb-3(b)(1), unless the authorization is terminated or revoked.  Performed at [facility], [place] [address], [place], [place] [address]   Blood Culture (routine x 2)     Status: None (Preliminary result)   Collection Time: 08/03/21  3:22 PM   Specimen: BLOOD  Result Value Ref Range Status   Specimen Description   Final    BLOOD LEFT ANTECUBITAL Performed at [place] [address], [place], [place] [address]    Special Requests   Final    BOTTLES DRAWN AEROBIC AND ANAEROBIC Blood Culture results may not be optimal due to an inadequate volume of blood received in culture bottles Performed at [place] [address], [place], [place] [address]    Culture  Setup Time   Final    GRAM NEGATIVE RODS AEROBIC BOTTLE ONLY CRITICAL RESULT CALLED TO, READ BACK BY AND VERIFIED WITH: PHARMD J GADHIA [id number] AT 1111 AM BY CM Performed at [facility]  Lab, [address], [place], [place] [address]    Culture GRAM NEGATIVE RODS  Final   Report Status PENDING  Incomplete  Blood Culture (routine x 2)     Status: None (Preliminary result)   Collection Time: 08/03/21  3:26 PM   Specimen: BLOOD RIGHT FOREARM  Result Value Ref Range Status   Specimen Description   Final    BLOOD RIGHT FOREARM Performed at [place] [address], [place], [place] [address]    Special Requests   Final    BOTTLES DRAWN AEROBIC AND ANAEROBIC Blood Culture adequate volume Performed at [place] [address] [place], [place] [address]    Culture  Setup Time   Final    GRAM NEGATIVE RODS AEROBIC BOTTLE ONLY CRITICAL VALUE NOTED.  VALUE IS CONSISTENT WITH PREVIOUSLY REPORTED AND CALLED VALUE. Performed at [facility], [place] [address], [place], [place] [address]    Culture  GRAM NEGATIVE RODS  Final   Report Status PENDING  Incomplete  Urine Culture     Status: None (Preliminary result)   Collection Time: 08/03/21  4:36 PM   Specimen: In/Out Cath Urine  Result Value Ref Range Status   Specimen Description   Final    IN/OUT CATH URINE Performed at [facility], [place] [address] [place], [place] [address]    Special Requests   Final    NONE Performed at [facility], [place] [address] [place], [place] [address]    Culture   Final    CULTURE REINCUBATED FOR BETTER GROWTH Performed at [facility], [place] [address], [place], [place] [address]    Report Status PENDING  Incomplete  MRSA Next Gen by PCR, Nasal     Status: None   Collection Time: 08/03/21  9:06 PM   Specimen: Nasal Mucosa; Nasal Swab  Result Value Ref Range Status   MRSA by PCR Next Gen NOT DETECTED NOT DETECTED Final    Comment: (NOTE) The GeneXpert MRSA Assay (FDA approved for NASAL specimens only), is one component of a comprehensive MRSA colonization surveillance program. It is not intended to diagnose MRSA infection nor to guide or monitor treatment for MRSA infections. Test performance is not FDA approved in patients less than 58 years old. Performed at [facility], [place] [address] [place], [place] [address]        Radiology Studies: CT Chest Wo Contrast  Result Date: 08/03/2021 CLINICAL DATA:  [place] yesterday, left-sided back pain, abnormal chest x-ray EXAM: CT CHEST WITHOUT CONTRAST TECHNIQUE: Multidetector CT imaging of the chest was performed following the standard protocol without IV contrast. COMPARISON:  Dental 22 FINDINGS: Cardiovascular: Unenhanced imaging of the heart demonstrates mild cardiomegaly with prominent biatrial dilation. No pericardial effusion. There is extensive atherosclerosis of the coronary vasculature. Normal caliber of the thoracic aorta without evidence of aneurysm. Evaluation of the vascular  lumen limited without IV contrast. Minimal atherosclerosis. Mediastinum/Nodes: Borderline enlarged right paratracheal lymph nodes measuring up to 13 mm in short axis reference image 29/2. No other pathologic adenopathy. Thyroid, trachea, and esophagus are unremarkable. Lungs/Pleura: There is bilateral lower lobe bronchial wall thickening, with patchy airspace disease consistent with bronchopneumonia. No effusion or pneumothorax. Central airways are patent. Upper Abdomen: No acute abnormality. Musculoskeletal: No acute or destructive bony lesions. Reconstructed images demonstrate no additional findings. IMPRESSION: 1. Bilateral lower lobe bronchial wall thickening and patchy airspace disease consistent with bronchopneumonia. 2. Borderline enlarged right  paratracheal lymph node, likely reactive. 3. Cardiomegaly. 4. Aortic Atherosclerosis (ICD10-I70.0). Coronary artery atherosclerosis. Electronically Signed   By: Randa Ngo M.D.   On: 08/03/2021 18:25   DG Chest Port 1 View  Result Date: 08/03/2021 CLINICAL DATA:  Questionable sepsis EXAM: PORTABLE CHEST 1 VIEW COMPARISON:  08/02/2021 FINDINGS: Low lung volumes. Unchanged, borderline enlarged cardiac silhouette, given differences in technique. Redemonstrated hazy bibasilar opacities, without new focal consolidative opacity. No definite pleural effusion pneumothorax. No acute osseous abnormality. IMPRESSION: Redemonstrated hazy bibasilar airspace opacities, which are nonspecific could represent developing infection. No new focal pulmonary opacity. Electronically Signed   By: Merilyn Baba M.D.   On: 08/03/2021 16:21       LOS: 2 days   [facility] Pager on [email or web address]  08/05/2021, 9:49 AM

## 2021-08-05 NOTE — Progress Notes (Signed)
Initial Nutrition Assessment  DOCUMENTATION CODES:   Obesity unspecified  INTERVENTION:  - will order Glucerna Shake BID, each supplement provides 220 kcal and 10 grams of protein. - will order 30 ml Prosource Plus BID, each supplement provides 100 kcal and 15 grams protein.  - will order 1 tablet multivitamin with minerals/day.    NUTRITION DIAGNOSIS:   Increased nutrient needs related to acute illness as evidenced by estimated needs.  GOAL:   Patient will meet greater than or equal to 90% of their needs  MONITOR:   PO intake, Supplement acceptance, Labs, Weight trends  REASON FOR ASSESSMENT:   Malnutrition Screening Tool  ASSESSMENT:   70 y.o. male with medical history of CAD s/p stenting, stage 3 CKD, HTN, type 2 DM, and recent extraction of all teeth pending implant placement in 09/2021. He presented to the ED due to increased confusion and tremors. He was having difficulty ambulating due to weakness. In the ED he was febrile.  Documented meal intakes from yesterday were 50% of breakfast and lunch and 25% of dinner.   Able to talk with RN prior to visit to patient's room. Patient laying in bed with no visitors present.   Patient reports teeth extractions were 5-6 months ago and that he is to have implants placed on 09/26/21. He has been eating soft, easy to chew foods or foods needing minimal chewing such as chicken salad, soup, tender meat, and well cooked vegetables.  He does not answer question of if he eats meals or just nibbles at things throughout the day despite asking multiple times, but does state that he does not eat much on a daily basis.  Patient is a picky eater. He did not eat much of breakfast  due to not caring for the flavor and texture of foods received. He is looking forward to lunch.  Patient sporadically weighs himself at home. Last did a week ago at which time he weighed 220 lb, per his report. He reports UBW of 230 lb but is unsure of the last time  he weighed this.  Weight yesterday was 253 lb and PTA the most recently documented weight was 233 lb on 09/13/20.   Labs reviewed; no CBGs documented today, BUN: 31 mg/dl, creatinine: 1.39 mg/dl, Ca: 8.2 mg/dl, Alk Phos elevated, LFTs slightly elevated, GFR: 54 ml/min.  Medications reviewed; 1000 mcg IM cyanocobalamin/day, 100 mg colace/day, sliding scale novolog.    NUTRITION - FOCUSED PHYSICAL EXAM:  Flowsheet Row Most Recent Value  Orbital Region No depletion  Upper Arm Region No depletion  Thoracic and Lumbar Region Unable to assess  Buccal Region No depletion  Temple Region No depletion  Clavicle Bone Region No depletion  Clavicle and Acromion Bone Region No depletion  Scapular Bone Region Unable to assess  Dorsal Hand No depletion  Patellar Region No depletion  Anterior Thigh Region No depletion  Posterior Calf Region No depletion  Edema (RD Assessment) Mild  [BLE (L > R)]  Hair Reviewed  Eyes Reviewed  Mouth Reviewed  Skin Reviewed  Nails Reviewed       Diet Order:   Diet Order             Diet heart healthy/carb modified Room service appropriate? Yes; Fluid consistency: Thin  Diet effective now                   EDUCATION NEEDS:   No education needs have been identified at this time  Skin:  Skin Assessment: Reviewed  RN Assessment  Last BM:  10/12  Height:   Ht Readings from Last 1 Encounters:  08/03/21 5' 8" (1.727 m)    Weight:   Wt Readings from Last 1 Encounters:  08/03/21 114.8 kg     Estimated Nutritional Needs:  Kcal:  2100-2300 kcal Protein:  105-120 grams Fluid:  >/= 2.2 L/day      Jarome Matin, MS, RD, LDN, CNSC Inpatient Clinical Dietitian RD pager # available in [facility]ION  After hours/weekend pager # available in [facility]

## 2021-08-06 DIAGNOSIS — D649 Anemia, unspecified: Secondary | ICD-10-CM

## 2021-08-06 DIAGNOSIS — R7881 Bacteremia: Secondary | ICD-10-CM | POA: Diagnosis not present

## 2021-08-06 DIAGNOSIS — A419 Sepsis, unspecified organism: Secondary | ICD-10-CM | POA: Diagnosis not present

## 2021-08-06 DIAGNOSIS — J189 Pneumonia, unspecified organism: Secondary | ICD-10-CM | POA: Diagnosis not present

## 2021-08-06 LAB — COMPREHENSIVE METABOLIC PANEL
ALT: 41 U/L (ref 0–44)
AST: 32 U/L (ref 15–41)
Albumin: 2.7 g/dL — ABNORMAL LOW (ref 3.5–5.0)
Alkaline Phosphatase: 195 U/L — ABNORMAL HIGH (ref 38–126)
Anion gap: 7 (ref 5–15)
BUN: 28 mg/dL — ABNORMAL HIGH (ref 8–23)
CO2: 26 mmol/L (ref 22–32)
Calcium: 8.3 mg/dL — ABNORMAL LOW (ref 8.9–10.3)
Chloride: 104 mmol/L (ref 98–111)
Creatinine, Ser: 1.39 mg/dL — ABNORMAL HIGH (ref 0.61–1.24)
GFR, Estimated: 54 mL/min — ABNORMAL LOW (ref >60)
Glucose, Bld: 124 mg/dL — ABNORMAL HIGH (ref 70–99)
Potassium: 3.9 mmol/L (ref 3.5–5.1)
Sodium: 137 mmol/L (ref 135–145)
Total Bilirubin: 0.6 mg/dL (ref 0.3–1.2)
Total Protein: 6 g/dL — ABNORMAL LOW (ref 6.5–8.1)

## 2021-08-06 LAB — HEMOGLOBIN A1C
Hgb A1c MFr Bld: 6.8 % — ABNORMAL HIGH (ref 4.8–5.6)
Mean Plasma Glucose: 148.46 mg/dL

## 2021-08-06 LAB — HEPATITIS PANEL, ACUTE
HCV Ab: NONREACTIVE
Hep A IgM: NONREACTIVE
Hep B C IgM: NONREACTIVE
Hepatitis B Surface Ag: NONREACTIVE

## 2021-08-06 LAB — CBC
HCT: 27.6 % — ABNORMAL LOW (ref 39.0–52.0)
Hemoglobin: 8.9 g/dL — ABNORMAL LOW (ref 13.0–17.0)
MCH: 29.8 pg (ref 26.0–34.0)
MCHC: 32.2 g/dL (ref 30.0–36.0)
MCV: 92.3 fL (ref 80.0–100.0)
Platelets: 178 10*3/uL (ref 150–400)
RBC: 2.99 MIL/uL — ABNORMAL LOW (ref 4.22–5.81)
RDW: 14.7 % (ref 11.5–15.5)
WBC: 5.9 10*3/uL (ref 4.0–10.5)
nRBC: 0 % (ref 0.0–0.2)

## 2021-08-06 LAB — PROCALCITONIN: Procalcitonin: 35.21 ng/mL

## 2021-08-06 LAB — GLUCOSE, CAPILLARY
Glucose-Capillary: 125 mg/dL — ABNORMAL HIGH (ref 70–99)
Glucose-Capillary: 170 mg/dL — ABNORMAL HIGH (ref 70–99)
Glucose-Capillary: 136 mg/dL — ABNORMAL HIGH (ref 70–99)
Glucose-Capillary: 190 mg/dL — ABNORMAL HIGH (ref 70–99)

## 2021-08-06 NOTE — Progress Notes (Addendum)
[facility] PROGRESS NOTE   Gregory [name] CLE:[id number] DOB: 27-Sep-1951 DOA: 08/03/2021  PCP: Seward Carol, MD  Brief History/Interval Summary: 70 y.o. male with history of CAD status post stenting, chronic kidney disease stage III, hypertension, diabetes mellitus type 2 was brought to the ER because of increasing confusion and tremors.  Patient was brought to the ER a day ago after patient had a fall while coming off his work and started having tremors.  Patient at the time was found to be confused.  Patient was brought to the ER and had CT head and blood works done but patient signed out [place].  Blood cultures at that time grew Klebsiella pneumoniae (Enterobacteriaceae).  Patient was subsequently found to be more confused with persistent tremors (rigors) as witnessed by patient's sister and was brought back to the ER.  Was finding it difficult to ambulate because of the weakness.  He was noted to be febrile.  He was hospitalized for further management.  Consultants: None  Procedures: None    Subjective/Interval History: Patient somnolent this morning though easily arousable.  Denies any complaints.  No shortness of breath.  No nausea or vomiting.     Assessment/Plan:  Klebsiella bacteremia/community-acquired pneumonia/sepsis, present on admission Sepsis likely due to bacteremia and pneumonia.  Patient was noted to be febrile and tachycardic.  Noted to have significant rigors.  Had to be admitted to stepdown and placed on cooling blankets.   WBC noted to be normal.  Procalcitonin improved from 62 to 35.  Fever appears to be subsiding.  Sensitivities of Klebsiella pneumonia reviewed.  Patient was changed over from ceftriaxone to cefazolin.  Continue IV antibiotics for another 24 to 48 hours till procalcitonin improves further. Source of bacteremia is most likely pneumonia.  No other source is identified.  His UA did not suggest infection. Will repeat blood  cultures.  Acute metabolic encephalopathy Most likely due to acute infection and sepsis.  No obvious focal neurological deficits noted.  CT head showed chronic small vessel disease. Patient is back to baseline.  Mildly elevated troponin Most likely due to demand ischemia from sepsis.  Patient does have a history of coronary artery disease and is status post stents in the past.  He is noted to be on aspirin which is being continued.  ECG shows sinus tachycardia without any obvious ischemic changes. Seems to be stable from a cardiac standpoint.  Acute on chronic kidney disease stage IIIa Baseline creatinine around 1.3.  Presented with creatinine of 2.3.  Likely due to hypovolemia.  Renal function has improved and back to baseline.  Monitor urine output.  Avoid nephrotoxic agent.    Diabetes mellitus type 2 with kidney complications with chronic kidney disease stage IIIa Patient mentions that he is on insulin at home.  Noted to be on Lantus and metformin according to home medication list.   CBGs are reasonably well controlled.   No recent HbA1c in our system.  Was 7.0 in November 2021.  Coronary artery disease/diastolic dysfunction Followed by Dr. Irish Lack.  Cardiac status seems to be stable.  Looks like he is on metoprolol, Plavix statin at home.  Plavix has been resumed.  Metoprolol and statin on hold.  Essential hypertension Blood pressure reasonably well controlled.  Amlodipine valsartan currently on hold.   Normocytic anemia/vitamin B12 deficiency Looks like he had normal hemoglobin last year.  Has experienced significant drop compared to that.  Anemia panel was done which showed ferritin to be 535, TIBC  208, folate 10.9, B12 low at 148. Started on B12 supplementation. He tells me today that he had a colonoscopy done by Dr. Benson Norway within the last 2 months which was unremarkable.   We will recommend that his hemoglobin be checked in few weeks time and if it still low he may benefit from  being referred to hematology.    Mildly abnormal LFTs Possibly due to acute illness.  Seems to be normal today.  Holding statin for now.  Should be able to resume at discharge.   Hepatitis panel is pending.  Obesity Estimated body mass index is 38.47 kg/m as calculated from the following:   Height as of this encounter: 5\' 8"  (1.727 m).   Weight as of this encounter: 114.8 kg.   DVT Prophylaxis: Lovenox Code Status: Full code Family Communication: Discussed with patient.  Sister was updated the last 2 days. Disposition Plan: Hopefully return home when improved.  Home health recommended by physical therapy.    Status is: Inpatient  Remains inpatient appropriate because:Altered mental status, IV treatments appropriate due to intensity of illness or inability to take PO, and Inpatient level of care appropriate due to severity of illness  Dispo: The patient is from: Home              Anticipated d/c is to: Home              Patient currently is not medically stable to d/c.   Difficult to place patient No      Medications: Scheduled:  (feeding supplement) PROSource Plus  30 mL Oral BID BM   Chlorhexidine Gluconate Cloth  6 each Topical Daily   clopidogrel  75 mg Oral Daily   cyanocobalamin  1,000 mcg Intramuscular Daily   Followed by   Derrill Memo ON 08/09/2021] vitamin B-12  1,000 mcg Oral Daily   docusate sodium  100 mg Oral Daily   enoxaparin (LOVENOX) injection  40 mg Subcutaneous Q24H   feeding supplement  237 mL Oral BID BM   feeding supplement (GLUCERNA SHAKE)  237 mL Oral BID BM   insulin aspart  0-9 Units Subcutaneous TID WC   multivitamin with minerals  1 tablet Oral Daily   Continuous:  sodium chloride Stopped (08/05/21 1024)    ceFAZolin (ANCEF) IV 2 g (08/06/21 0636)   ZOX:WRUEAV chloride, acetaminophen **OR** acetaminophen, hydrALAZINE  Antibiotics: Anti-infectives (From admission, onward)    Start     Dose/Rate Route Frequency Ordered Stop   08/05/21 2200   ceFAZolin (ANCEF) IVPB 2g/100 mL premix  Status:  Discontinued        2 g 200 mL/hr over 30 Minutes Intravenous Every 8 hours 08/05/21 1103 08/05/21 1104   08/05/21 2200  ceFAZolin (ANCEF) IVPB 2g/100 mL premix        2 g 200 mL/hr over 30 Minutes Intravenous Every 8 hours 08/05/21 1106 08/11/21 0559   08/03/21 2200  cefTRIAXone (ROCEPHIN) 2 g in sodium chloride 0.9 % 100 mL IVPB  Status:  Discontinued        2 g 200 mL/hr over 30 Minutes Intravenous Every 24 hours 08/03/21 2027 08/05/21 1103   08/03/21 1630  vancomycin (VANCOCIN) IVPB 1000 mg/200 mL premix        1,000 mg 200 mL/hr over 60 Minutes Intravenous  Once 08/03/21 1544 08/03/21 1827   08/03/21 1515  ceFEPIme (MAXIPIME) 2 g in sodium chloride 0.9 % 100 mL IVPB        2 g 200 mL/hr over 30  Minutes Intravenous  Once 08/03/21 1513 08/03/21 1612   08/03/21 1515  metroNIDAZOLE (FLAGYL) IVPB 500 mg        500 mg 100 mL/hr over 60 Minutes Intravenous  Once 08/03/21 1513 08/03/21 1826   08/03/21 1515  vancomycin (VANCOCIN) IVPB 1000 mg/200 mL premix        1,000 mg 200 mL/hr over 60 Minutes Intravenous  Once 08/03/21 1513 08/03/21 1639       Objective:  Vital Signs  Vitals:   08/05/21 1847 08/05/21 2131 08/06/21 0052 08/06/21 0511  BP: 126/67 132/69 131/73 135/73  Pulse: 89 96 98 88  Resp: 20 (!) 24 20 (!) 24  Temp: 99 F (37.2 C) 99.9 F (37.7 C) 100 F (37.8 C) 99.8 F (37.7 C)  TempSrc: Oral Oral Oral Oral  SpO2: 94% 93% 90% 94%  Weight:      Height:        Intake/Output Summary (Last 24 hours) at 08/06/2021 0947 Last data filed at 08/06/2021 0848 Gross per 24 hour  Intake 244.1 ml  Output 1750 ml  Net -1505.9 ml    Filed Weights   08/03/21 1433  Weight: 114.8 kg    General appearance: Awake alert.  In no distress Resp: Clear to auscultation bilaterally.  Normal effort Cardio: S1-S2 is normal regular.  No S3-S4.  No rubs murmurs or bruit GI: Abdomen is soft.  Nontender nondistended.  Bowel sounds are  present normal.  No masses organomegaly Extremities: No edema.  Full range of motion of lower extremities. Neurologic: No focal neurological deficits.       Lab Results:  Data Reviewed: I have personally reviewed following labs and imaging studies  CBC: Recent Labs  Lab 08/02/21 1843 08/03/21 1528 08/03/21 2059 08/04/21 0245 08/05/21 0250 08/06/21 0538  WBC 13.6* 13.1* 13.2* 13.0* 8.6 5.9  NEUTROABS 12.8* 11.7*  --  10.8*  --   --   HGB 10.2* 9.9* 9.1* 9.0* 8.8* 8.9*  HCT 31.3* 30.7* 28.6* 28.3* 27.3* 27.6*  MCV 92.9 92.7 93.8 94.3 93.8 92.3  PLT 206 188 170 164 155 178     Basic Metabolic Panel: Recent Labs  Lab 08/02/21 1843 08/03/21 1528 08/03/21 2059 08/04/21 0245 08/05/21 0250 08/06/21 0538  NA 136 140  --  140 136 137  K 4.0 3.8  --  4.0 4.1 3.9  CL 104 106  --  109 105 104  CO2 22 20*  --  20* 24 26  GLUCOSE 195* 193*  --  144* 127* 124*  BUN 44* 47*  --  36* 31* 28*  CREATININE 2.23* 2.32* 2.01* 1.77* 1.39* 1.39*  CALCIUM 8.8* 9.2  --  8.9 8.2* 8.3*     GFR: Estimated Creatinine Clearance: 60 mL/min (A) (by C-G formula based on SCr of 1.39 mg/dL (H)).  Liver Function Tests: Recent Labs  Lab 08/02/21 1843 08/03/21 1528 08/04/21 0245 08/05/21 0250 08/06/21 0538  AST 15 43* 51* 56* 32  ALT 12 23 32 48* 41  ALKPHOS 81 111 104 182* 195*  BILITOT 0.9 0.9 0.8 0.8 0.6  PROT 7.1 7.8 6.2* 6.0* 6.0*  ALBUMIN 3.6 3.7 2.9* 2.9* 2.7*      Coagulation Profile: Recent Labs  Lab 08/03/21 1528  INR 1.2      CBG: Recent Labs  Lab 08/05/21 0737 08/05/21 1200 08/05/21 1605 08/05/21 2125 08/06/21 0732  GLUCAP 118* 202* 107* 109* 125*      Thyroid Function Tests: Recent Labs    08/03/21  2216  TSH 1.242     Anemia Panel: Recent Labs    08/03/21 2059  VITAMINB12 148*  FOLATE 10.9  FERRITIN 535*  TIBC 208*  IRON 9*  RETICCTPCT 1.0     Recent Results (from the past 240 hour(s))  Culture, blood (single)     Status:  Abnormal   Collection Time: 08/02/21  6:43 PM   Specimen: BLOOD  Result Value Ref Range Status   Specimen Description BLOOD LEFT ANTECUBITAL  Final   Special Requests   Final    BOTTLES DRAWN AEROBIC AND ANAEROBIC Blood Culture adequate volume   Culture  Setup Time   Final    GRAM NEGATIVE RODS IN BOTH AEROBIC AND ANAEROBIC BOTTLES CRITICAL RESULT CALLED TO, READ BACK BY AND VERIFIED WITH: RN [facility] JAMIE BAILEY [id number] AT 2725 BY CM Performed at [facility], [place] [address], [place], [place] [address]    Culture KLEBSIELLA PNEUMONIAE (A)  Final   Report Status 08/05/2021 FINAL  Final   Organism ID, Bacteria KLEBSIELLA PNEUMONIAE  Final      Susceptibility   Klebsiella pneumoniae - MIC*    AMPICILLIN >=32 RESISTANT Resistant     CEFAZOLIN <=4 SENSITIVE Sensitive     CEFEPIME <=0.12 SENSITIVE Sensitive     CEFTAZIDIME <=1 SENSITIVE Sensitive     CEFTRIAXONE <=0.25 SENSITIVE Sensitive     CIPROFLOXACIN <=0.25 SENSITIVE Sensitive     GENTAMICIN <=1 SENSITIVE Sensitive     IMIPENEM 0.5 SENSITIVE Sensitive     TRIMETH/SULFA <=20 SENSITIVE Sensitive     AMPICILLIN/SULBACTAM 8 SENSITIVE Sensitive     PIP/TAZO <=4 SENSITIVE Sensitive     * KLEBSIELLA PNEUMONIAE  Blood Culture ID Panel (Reflexed)     Status: Abnormal   Collection Time: 08/02/21  6:43 PM  Result Value Ref Range Status   Enterococcus faecalis NOT DETECTED NOT DETECTED Final   Enterococcus Faecium NOT DETECTED NOT DETECTED Final   Listeria monocytogenes NOT DETECTED NOT DETECTED Final   Staphylococcus species NOT DETECTED NOT DETECTED Final   Staphylococcus aureus (BCID) NOT DETECTED NOT DETECTED Final   Staphylococcus epidermidis NOT DETECTED NOT DETECTED Final   Staphylococcus lugdunensis NOT DETECTED NOT DETECTED Final   Streptococcus species NOT DETECTED NOT DETECTED Final   Streptococcus agalactiae NOT DETECTED NOT DETECTED Final   Streptococcus pneumoniae NOT DETECTED NOT DETECTED Final   Streptococcus  pyogenes NOT DETECTED NOT DETECTED Final   A.calcoaceticus-baumannii NOT DETECTED NOT DETECTED Final   Bacteroides fragilis NOT DETECTED NOT DETECTED Final   Enterobacterales DETECTED (A) NOT DETECTED Final    Comment: Enterobacterales represent a large order of gram negative bacteria, not a single organism. CRITICAL RESULT CALLED TO, READ BACK BY AND VERIFIED WITH: PHARMD A LAWLESS [id number] AT 1137 AM BY CM    Enterobacter cloacae complex NOT DETECTED NOT DETECTED Final   Escherichia coli NOT DETECTED NOT DETECTED Final   Klebsiella aerogenes NOT DETECTED NOT DETECTED Final   Klebsiella oxytoca NOT DETECTED NOT DETECTED Final   Klebsiella pneumoniae DETECTED (A) NOT DETECTED Final    Comment: CRITICAL RESULT CALLED TO, READ BACK BY AND VERIFIED WITH: PHARMD A LAWLESS [id number] AT 1137 BY CM    Proteus species NOT DETECTED NOT DETECTED Final   Salmonella species NOT DETECTED NOT DETECTED Final   Serratia marcescens NOT DETECTED NOT DETECTED Final   Haemophilus influenzae NOT DETECTED NOT DETECTED Final   Neisseria meningitidis NOT DETECTED NOT DETECTED Final   Pseudomonas aeruginosa NOT DETECTED NOT DETECTED Final  Stenotrophomonas maltophilia NOT DETECTED NOT DETECTED Final   Candida albicans NOT DETECTED NOT DETECTED Final   Candida auris NOT DETECTED NOT DETECTED Final   Candida glabrata NOT DETECTED NOT DETECTED Final   Candida krusei NOT DETECTED NOT DETECTED Final   Candida parapsilosis NOT DETECTED NOT DETECTED Final   Candida tropicalis NOT DETECTED NOT DETECTED Final   Cryptococcus neoformans/gattii NOT DETECTED NOT DETECTED Final   CTX-M ESBL NOT DETECTED NOT DETECTED Final   Carbapenem resistance IMP NOT DETECTED NOT DETECTED Final   Carbapenem resistance KPC NOT DETECTED NOT DETECTED Final   Carbapenem resistance NDM NOT DETECTED NOT DETECTED Final   Carbapenem resist OXA 48 LIKE NOT DETECTED NOT DETECTED Final   Carbapenem resistance VIM NOT DETECTED NOT DETECTED Final     Comment: Performed at [facility], [address], [place], [place] [address]  Resp Panel by RT-PCR (Flu A&B, Covid) Nasopharyngeal Swab     Status: None   Collection Time: 08/03/21  3:17 PM   Specimen: Nasopharyngeal Swab; Nasopharyngeal(NP) swabs in vial transport medium  Result Value Ref Range Status   SARS Coronavirus 2 by RT PCR NEGATIVE NEGATIVE Final    Comment: (NOTE) SARS-CoV-2 target nucleic acids are NOT DETECTED.  The SARS-CoV-2 RNA is generally detectable in upper respiratory specimens during the acute phase of infection. The lowest concentration of SARS-CoV-2 viral copies this assay can detect is 138 copies/mL. A negative result does not preclude SARS-Cov-2 infection and should not be used as the sole basis for treatment or other patient management decisions. A negative result may occur with  improper specimen collection/handling, submission of specimen other than nasopharyngeal swab, presence of viral mutation(s) within the areas targeted by this assay, and inadequate number of viral copies(<138 copies/mL). A negative result must be combined with clinical observations, patient history, and epidemiological information. The expected result is Negative.  Fact Sheet for Patients:  EntrepreneurPulse.com.au  Fact Sheet for Healthcare Providers:  IncredibleEmployment.be  This test is no t yet approved or cleared by the [place] FDA and  has been authorized for detection and/or diagnosis of SARS-CoV-2 by FDA under an Emergency Use Authorization (EUA). This EUA will remain  in effect (meaning this test can be used) for the duration of the COVID-19 declaration under Section 564(b)(1) of the Act, 21 U.S.C.section 360bbb-3(b)(1), unless the authorization is terminated  or revoked sooner.       Influenza A by PCR NEGATIVE NEGATIVE Final   Influenza B by PCR NEGATIVE NEGATIVE Final    Comment: (NOTE) The Xpert Xpress  SARS-CoV-2/FLU/RSV plus assay is intended as an aid in the diagnosis of influenza from Nasopharyngeal swab specimens and should not be used as a sole basis for treatment. Nasal washings and aspirates are unacceptable for Xpert Xpress SARS-CoV-2/FLU/RSV testing.  Fact Sheet for Patients: EntrepreneurPulse.com.au  Fact Sheet for Healthcare Providers: IncredibleEmployment.be  This test is not yet approved or cleared by the [place] FDA and has been authorized for detection and/or diagnosis of SARS-CoV-2 by FDA under an Emergency Use Authorization (EUA). This EUA will remain in effect (meaning this test can be used) for the duration of the COVID-19 declaration under Section 564(b)(1) of the Act, 21 U.S.C. section 360bbb-3(b)(1), unless the authorization is terminated or revoked.  Performed at [facility], [place] [address] [place], [place] [address]   Blood Culture (routine x 2)     Status: Abnormal   Collection Time: 08/03/21  3:22 PM   Specimen: BLOOD  Result Value Ref Range Status   Specimen Description   Final    BLOOD LEFT ANTECUBITAL Performed at [place] [address], [place], [place] [address]    Special Requests   Final    BOTTLES DRAWN AEROBIC AND ANAEROBIC Blood Culture results may not be optimal due to an inadequate volume of blood received in culture bottles Performed at [place] [address] [place], [place] [address]    Culture  Setup Time   Final    GRAM NEGATIVE RODS AEROBIC BOTTLE ONLY CRITICAL RESULT CALLED TO, READ BACK BY AND VERIFIED WITH: PHARMD J GADHIA [id number] AT 78 AM BY CM    Culture (A)  Final    KLEBSIELLA PNEUMONIAE SUSCEPTIBILITIES PERFORMED ON PREVIOUS CULTURE WITHIN THE LAST 5 DAYS. Performed at [facility], [place] [address] [place], [place] [address]    Report Status 08/05/2021 FINAL  Final  Blood Culture (routine x 2)      Status: Abnormal   Collection Time: 08/03/21  3:26 PM   Specimen: BLOOD RIGHT FOREARM  Result Value Ref Range Status   Specimen Description   Final    BLOOD RIGHT FOREARM Performed at [place] [address] [place], [place] [address]    Special Requests   Final    BOTTLES DRAWN AEROBIC AND ANAEROBIC Blood Culture adequate volume Performed at [place] [address], [place], [place] [address]    Culture  Setup Time   Final    GRAM NEGATIVE RODS AEROBIC BOTTLE ONLY CRITICAL VALUE NOTED.  VALUE IS CONSISTENT WITH PREVIOUSLY REPORTED AND CALLED VALUE.    Culture (A)  Final    KLEBSIELLA PNEUMONIAE SUSCEPTIBILITIES PERFORMED ON PREVIOUS CULTURE WITHIN THE LAST 5 DAYS. Performed at [facility], [place] [address], [place], [place] [address]    Report Status 08/05/2021 FINAL  Final  Urine Culture     Status: Abnormal   Collection Time: 08/03/21  4:36 PM   Specimen: In/Out Cath Urine  Result Value Ref Range Status   Specimen Description   Final    IN/OUT CATH URINE Performed at [place] [address], [place], [place] [address]    Special Requests   Final    NONE Performed at [facility], [place] [address], [place], [place] [address]    Culture (A)  Final    10,000 COLONIES/mL DIPHTHEROIDS(CORYNEBACTERIUM SPECIES) Standardized susceptibility testing for this organism is not available. Performed at [facility], [place] [address], [place], [place] [address]    Report Status 08/05/2021 FINAL  Final  MRSA Next Gen by PCR, Nasal     Status: None   Collection Time: 08/03/21  9:06 PM   Specimen: Nasal Mucosa; Nasal Swab  Result Value Ref Range Status   MRSA by PCR Next Gen NOT DETECTED NOT DETECTED Final    Comment: (NOTE) The GeneXpert MRSA Assay (FDA approved for NASAL specimens only), is one component of a comprehensive MRSA colonization surveillance program. It is not intended to  diagnose MRSA infection nor to guide or monitor treatment for MRSA infections. Test performance is not FDA approved in patients less than 48 years old. Performed at [facility], [place] [address] [place], [place] [address]        Radiology Studies: No results found.     LOS: 3 days   Emmert Roethler [employer] on [email or web address]  08/06/2021, 9:47  AM

## 2021-08-06 NOTE — Progress Notes (Signed)
Afternoon VS :  Temp 38.4, BP 162/65.  MD [place] Pink made aware via secure chat.  Per orders- prn tylenol given for temp, prn hydralazine given for elevated BP.  Rechecked VS:  Temp 36.6, BP 131/79.  Will CTM

## 2021-08-06 NOTE — Evaluation (Signed)
Occupational Therapy Evaluation Patient Details Name: Gregory [name] MRN: [id number] DOB: 12-Jun-1951 Today's Date: 08/06/2021   History of Present Illness Gregory [name] is a 70 y.o. male presents with increasing confusion and tremors. Pt admitted with bacteremia and pneumonia. PMH: CAD s/p stenting, chronic kidney disease stage III, HTN, diabetes   Clinical Impression   Patient is a 70 year old male who was admitted for above. Patient was independent working two jobs prior to hospital admission. Currently, patients evaluation was limited with HR increasing to 125-130 bpm with seated bathing tasks. Patient denies any symptoms but was noted to be fatigued and winded. Nurse made aware. Patient would continue to benefit from skilled OT services at this time while admitted and after d/c to address noted deficits in order to improve overall safety and independence in ADLs.        Recommendations for follow up therapy are one component of a multi-disciplinary discharge planning process, led by the attending physician.  Recommendations may be updated based on patient status, additional functional criteria and insurance authorization.   Follow Up Recommendations  Home health OT;Supervision/Assistance - 24 hour    Equipment Recommendations  Other (comment) (total hip kit)    Recommendations for Other Services       Precautions / Restrictions Precautions Precautions: Fall Precaution Comments: monitor HR Restrictions Weight Bearing Restrictions: No      Mobility Bed Mobility               General bed mobility comments: in recliner    Transfers                      Balance Overall balance assessment: Needs assistance Sitting-balance support: Feet supported Sitting balance-Leahy Scale: Fair                                     ADL either performed or assessed with clinical judgement   ADL Overall ADL's : Needs assistance/impaired Eating/Feeding:  Set up;Sitting   Grooming: Wash/dry face;Wash/dry hands;Sitting Grooming Details (indicate cue type and reason): patient was seated in recliner during session with noted SOB with HR increased to 125 bpm with attempts at washing UB while seated in chair. nursing made aware. Upper Body Bathing: Set up;Sitting Upper Body Bathing Details (indicate cue type and reason): in recliner Lower Body Bathing: Moderate assistance;Sitting/lateral leans Lower Body Bathing Details (indicate cue type and reason): in recliner. patient unable to reach feet or bring feet to lap Upper Body Dressing : Set up;Sitting   Lower Body Dressing: Maximal assistance;Sitting/lateral leans;Sit to/from Health and safety inspector Details (indicate cue type and reason): deferred on this date with increased HR with seated activity. Toileting- Clothing Manipulation and Hygiene: Maximal assistance;Sitting/lateral lean               Vision Patient Visual Report: No change from baseline       Perception     Praxis      Pertinent Vitals/Pain Pain Assessment: No/denies pain     Hand Dominance Right   Extremity/Trunk Assessment Upper Extremity Assessment Upper Extremity Assessment: Overall WFL for tasks assessed   Lower Extremity Assessment Lower Extremity Assessment: Defer to PT evaluation   Cervical / Trunk Assessment Cervical / Trunk Assessment: Normal   Communication Communication Communication: No difficulties   Cognition Arousal/Alertness: Awake/alert Behavior During Therapy: WFL for tasks assessed/performed Overall Cognitive Status: Within Functional  Limits for tasks assessed                                     General Comments       Exercises     Shoulder Instructions      Home Living Family/patient expects to be discharged to:: Private residence Living Arrangements: Alone Available Help at Discharge: Family;Friend(s);Available PRN/intermittently Type of Home: House Home  Access: Ramped entrance;Stairs to enter Entrance Stairs-Number of Steps: 5 Entrance Stairs-Rails: Right;Left Home Layout: Two level;Able to live on main level with bedroom/bathroom;Laundry or work area in Building surveyor of Steps: 15 Alternate Level Stairs-Rails: Right Bathroom Shower/Tub: Occupational psychologist: Handicapped height     Home Equipment: Environmental consultant - 2 wheels;Wheelchair - manual;Shower seat - built in;Grab bars - tub/shower   Additional Comments: Pt's house is well equipped and has DME needs from deceased spouse      Prior Functioning/Environment Level of Independence: Independent        Comments: 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 independent, drives, works 2 jobs Archivist)        OT Problem List: Decreased strength;Decreased activity tolerance;Impaired balance (sitting and/or standing);Decreased safety awareness;Cardiopulmonary status limiting activity;Decreased knowledge of precautions;Decreased knowledge of use of DME or AE      OT Treatment/Interventions: Self-care/ADL training;Therapeutic exercise;Neuromuscular education;Energy conservation;DME and/or AE instruction;Therapeutic activities;Balance training;Patient/family education    OT Goals(Current goals can be found in the care plan section) Acute Rehab OT Goals Patient Stated Goal: return home OT Goal Formulation: With patient Time For Goal Achievement: 08/20/21 Potential to Achieve Goals: Good  OT Frequency: Min 2X/week   Barriers to D/C: Decreased caregiver support  patient lives at home alone       Co-evaluation              AM-PAC OT "6 Clicks" Daily Activity     Outcome Measure Help from another person eating meals?: A Little Help from another person taking care of personal grooming?: A Little Help from another person toileting, which includes using toliet, bedpan, or urinal?: A Lot Help from another person bathing (including washing, rinsing, drying)?:  A Lot Help from another person to put on and taking off regular upper body clothing?: A Little Help from another person to put on and taking off regular lower body clothing?: A Lot 6 Click Score: 15   End of Session Nurse Communication: Other (comment) (patients HR with activity)  Activity Tolerance: Patient limited by fatigue Patient left: in chair;with call bell/phone within reach  OT Visit Diagnosis: Unsteadiness on feet (R26.81);Muscle weakness (generalized) (M62.81)                Time: 9892-1194 OT Time Calculation (min): 13 min Charges:  OT General Charges $OT Visit: 1 Visit OT Evaluation $OT Eval Moderate Complexity: 1 Mod  Jackelyn Poling OTR/L, MS Acute Rehabilitation Department Office# [phone] Pager# [phone]   [place] 08/06/2021, 3:32 PM

## 2021-08-07 DIAGNOSIS — J189 Pneumonia, unspecified organism: Secondary | ICD-10-CM | POA: Diagnosis not present

## 2021-08-07 DIAGNOSIS — D649 Anemia, unspecified: Secondary | ICD-10-CM | POA: Diagnosis not present

## 2021-08-07 DIAGNOSIS — R7881 Bacteremia: Secondary | ICD-10-CM | POA: Diagnosis not present

## 2021-08-07 LAB — BASIC METABOLIC PANEL
Anion gap: 7 (ref 5–15)
BUN: 20 mg/dL (ref 8–23)
CO2: 27 mmol/L (ref 22–32)
Calcium: 8.4 mg/dL — ABNORMAL LOW (ref 8.9–10.3)
Chloride: 103 mmol/L (ref 98–111)
Creatinine, Ser: 1.24 mg/dL (ref 0.61–1.24)
GFR, Estimated: >60 mL/min (ref >60)
Glucose, Bld: 138 mg/dL — ABNORMAL HIGH (ref 70–99)
Potassium: 3.8 mmol/L (ref 3.5–5.1)
Sodium: 137 mmol/L (ref 135–145)

## 2021-08-07 LAB — GLUCOSE, CAPILLARY
Glucose-Capillary: 132 mg/dL — ABNORMAL HIGH (ref 70–99)
Glucose-Capillary: 173 mg/dL — ABNORMAL HIGH (ref 70–99)
Glucose-Capillary: 119 mg/dL — ABNORMAL HIGH (ref 70–99)
Glucose-Capillary: 128 mg/dL — ABNORMAL HIGH (ref 70–99)

## 2021-08-07 LAB — CBC
HCT: 28.2 % — ABNORMAL LOW (ref 39.0–52.0)
Hemoglobin: 8.9 g/dL — ABNORMAL LOW (ref 13.0–17.0)
MCH: 29.4 pg (ref 26.0–34.0)
MCHC: 31.6 g/dL (ref 30.0–36.0)
MCV: 93.1 fL (ref 80.0–100.0)
Platelets: 192 10*3/uL (ref 150–400)
RBC: 3.03 MIL/uL — ABNORMAL LOW (ref 4.22–5.81)
RDW: 14.5 % (ref 11.5–15.5)
WBC: 6.2 10*3/uL (ref 4.0–10.5)
nRBC: 0.5 % — ABNORMAL HIGH (ref 0.0–0.2)

## 2021-08-07 LAB — PROCALCITONIN: Procalcitonin: 20.43 ng/mL

## 2021-08-07 NOTE — Progress Notes (Signed)
[facility] PROGRESS NOTE   Gregory [name] BJY:[id number] DOB: Feb 21, 1951 DOA: 08/03/2021  PCP: Seward Carol, MD  Brief History/Interval Summary: 70 y.o. male with history of CAD status post stenting, chronic kidney disease stage III, hypertension, diabetes mellitus type 2 was brought to the ER because of increasing confusion and tremors.  Patient was brought to the ER a day ago after patient had a fall while coming off his work and started having tremors.  Patient at the time was found to be confused.  Patient was brought to the ER and had CT head and blood works done but patient signed out [place].  Blood cultures at that time grew Klebsiella pneumoniae (Enterobacteriaceae).  Patient was subsequently found to be more confused with persistent tremors (rigors) as witnessed by patient's sister and was brought back to the ER.  Was finding it difficult to ambulate because of the weakness.  He was noted to be febrile.  He was hospitalized for further management.  Consultants: None  Procedures: None    Subjective/Interval History: Patient awake alert this morning.  Denies any complaints.  No nausea or vomiting.  No abdominal pain.  No shortness of breath.     Assessment/Plan:  Klebsiella bacteremia/community-acquired pneumonia/sepsis, present on admission Sepsis likely due to bacteremia and pneumonia.  Patient was noted to be febrile and tachycardic.  Noted to have significant rigors.  Had to be admitted to stepdown and placed on cooling blankets.  Patient was initially placed on ceftriaxone.  Based on sensitivities he was changed over to cefazolin. WBC has been normal.  Procalcitonin was significantly elevated at 62.  Gradually improving and down to 20 today.  Continue IV antibiotics for another 24 hours.  We will recheck procalcitonin tomorrow.  Patient again noted to have fever yesterday afternoon.  He feels well otherwise.  No new complaints offered.  Blood  cultures were repeated yesterday.  Anticipate changing to oral antibiotics once he is fever free for more than 24 hours and if his repeat cultures are negative.  Would also like to see some more improvement in his procalcitonin levels. Source of bacteremia is most likely pneumonia.  No other source is identified.  His UA did not suggest infection.  Acute metabolic encephalopathy This was most likely due to acute infection and sepsis.  No obvious focal neurological deficits noted.  CT head showed chronic small vessel disease. Patient is back to baseline.  Mildly elevated troponin Most likely due to demand ischemia from sepsis.  Patient does have a history of coronary artery disease and is status post stents in the past.  He is noted to be on aspirin which is being continued.  ECG shows sinus tachycardia without any obvious ischemic changes. Seems to be stable from a cardiac standpoint.  Acute on chronic kidney disease stage IIIa Baseline creatinine around 1.3.  Presented with creatinine of 2.3.  Likely due to hypovolemia.  Renal function has improved and back to baseline.  Monitor urine output.  Avoid nephrotoxic agent.    Diabetes mellitus type 2 with kidney complications with chronic kidney disease stage IIIa Patient mentions that he is on insulin at home.  Noted to be on Lantus and metformin according to home medication list.  CBGs are reasonably well controlled.  HbA1c 6.8.  Coronary artery disease/diastolic dysfunction Followed by Dr. Irish Lack.  Cardiac status seems to be stable.  Looks like he is on metoprolol, Plavix statin at home.  Plavix has been resumed.  Metoprolol and statin  on hold.  Essential hypertension Blood pressure reasonably well controlled.  Amlodipine valsartan currently on hold.   Normocytic anemia/vitamin B12 deficiency Looks like he had normal hemoglobin last year.  Has experienced significant drop compared to that.  Anemia panel was done which showed ferritin to be  535, TIBC 208, folate 10.9, B12 low at 148. Started on B12 supplementation. He mentioned that he had a colonoscopy done by Dr. Benson Norway within the last 2 months which was unremarkable.   We will recommend that his hemoglobin be checked in few weeks time and if it still low he may benefit from being referred to hematology.    Mildly abnormal LFTs Possibly due to acute illness.  Seems to be normal today.  Holding statin for now.  Should be able to resume at discharge.   Hepatitis panel is unremarkable.  Obesity Estimated body mass index is 38.47 kg/m as calculated from the following:   Height as of this encounter: 5\' 8"  (1.727 m).   Weight as of this encounter: 114.8 kg.   DVT Prophylaxis: Lovenox Code Status: Full code Family Communication: Patient updated.  Sister being updated periodically. Disposition Plan: Hopefully return home when improved.  Home health recommended by physical therapy.    Status is: Inpatient  Remains inpatient appropriate because: Persistent fever in a patient with bacteremia.      Medications: Scheduled:  (feeding supplement) PROSource Plus  30 mL Oral BID BM   Chlorhexidine Gluconate Cloth  6 each Topical Daily   clopidogrel  75 mg Oral Daily   cyanocobalamin  1,000 mcg Intramuscular Daily   Followed by   Derrill Memo ON 08/09/2021] vitamin B-12  1,000 mcg Oral Daily   docusate sodium  100 mg Oral Daily   enoxaparin (LOVENOX) injection  40 mg Subcutaneous Q24H   feeding supplement  237 mL Oral BID BM   feeding supplement (GLUCERNA SHAKE)  237 mL Oral BID BM   insulin aspart  0-9 Units Subcutaneous TID WC   multivitamin with minerals  1 tablet Oral Daily   Continuous:  sodium chloride Stopped (08/05/21 1024)    ceFAZolin (ANCEF) IV 2 g (08/07/21 0426)   XFG:HWEXHB chloride, acetaminophen **OR** acetaminophen, hydrALAZINE  Antibiotics: Anti-infectives (From admission, onward)    Start     Dose/Rate Route Frequency Ordered Stop   08/05/21 2200   ceFAZolin (ANCEF) IVPB 2g/100 mL premix  Status:  Discontinued        2 g 200 mL/hr over 30 Minutes Intravenous Every 8 hours 08/05/21 1103 08/05/21 1104   08/05/21 2200  ceFAZolin (ANCEF) IVPB 2g/100 mL premix        2 g 200 mL/hr over 30 Minutes Intravenous Every 8 hours 08/05/21 1106 08/11/21 0559   08/03/21 2200  cefTRIAXone (ROCEPHIN) 2 g in sodium chloride 0.9 % 100 mL IVPB  Status:  Discontinued        2 g 200 mL/hr over 30 Minutes Intravenous Every 24 hours 08/03/21 2027 08/05/21 1103   08/03/21 1630  vancomycin (VANCOCIN) IVPB 1000 mg/200 mL premix        1,000 mg 200 mL/hr over 60 Minutes Intravenous  Once 08/03/21 1544 08/03/21 1827   08/03/21 1515  ceFEPIme (MAXIPIME) 2 g in sodium chloride 0.9 % 100 mL IVPB        2 g 200 mL/hr over 30 Minutes Intravenous  Once 08/03/21 1513 08/03/21 1612   08/03/21 1515  metroNIDAZOLE (FLAGYL) IVPB 500 mg        500 mg 100  mL/hr over 60 Minutes Intravenous  Once 08/03/21 1513 08/03/21 1826   08/03/21 1515  vancomycin (VANCOCIN) IVPB 1000 mg/200 mL premix        1,000 mg 200 mL/hr over 60 Minutes Intravenous  Once 08/03/21 1513 08/03/21 1639       Objective:  Vital Signs  Vitals:   08/06/21 1400 08/06/21 1701 08/06/21 2110 08/07/21 0450  BP: (!) 162/65 131/79 (!) 141/75 115/62  Pulse: 98 98 78 86  Resp: 20 18 16 18   Temp: (!) 101.1 F (38.4 C) 97.8 F (36.6 C) 99.5 F (37.5 C) 98.9 F (37.2 C)  TempSrc: Oral Oral Oral Oral  SpO2:   93% (!) 87%  Weight:      Height:        Intake/Output Summary (Last 24 hours) at 08/07/2021 0935 Last data filed at 08/07/2021 0839 Gross per 24 hour  Intake 981.5 ml  Output 575 ml  Net 406.5 ml    Filed Weights   08/03/21 1433  Weight: 114.8 kg    General appearance: Awake alert.  In no distress Resp: Clear to auscultation bilaterally.  Normal effort Cardio: S1-S2 is normal regular.  No S3-S4.  No rubs murmurs or bruit GI: Abdomen is soft.  Nontender nondistended.  Bowel sounds  are present normal.  No masses organomegaly Extremities: No edema.  Full range of motion of lower extremities. Neurologic:   No focal neurological deficits.      Lab Results:  Data Reviewed: I have personally reviewed following labs and imaging studies  CBC: Recent Labs  Lab 08/02/21 1843 08/03/21 1528 08/03/21 2059 08/04/21 0245 08/05/21 0250 08/06/21 0538 08/07/21 0530  WBC 13.6* 13.1* 13.2* 13.0* 8.6 5.9 6.2  NEUTROABS 12.8* 11.7*  --  10.8*  --   --   --   HGB 10.2* 9.9* 9.1* 9.0* 8.8* 8.9* 8.9*  HCT 31.3* 30.7* 28.6* 28.3* 27.3* 27.6* 28.2*  MCV 92.9 92.7 93.8 94.3 93.8 92.3 93.1  PLT 206 188 170 164 155 178 192     Basic Metabolic Panel: Recent Labs  Lab 08/03/21 1528 08/03/21 2059 08/04/21 0245 08/05/21 0250 08/06/21 0538 08/07/21 0530  NA 140  --  140 136 137 137  K 3.8  --  4.0 4.1 3.9 3.8  CL 106  --  109 105 104 103  CO2 20*  --  20* 24 26 27   GLUCOSE 193*  --  144* 127* 124* 138*  BUN 47*  --  36* 31* 28* 20  CREATININE 2.32* 2.01* 1.77* 1.39* 1.39* 1.24  CALCIUM 9.2  --  8.9 8.2* 8.3* 8.4*     GFR: Estimated Creatinine Clearance: 67.2 mL/min (by C-G formula based on SCr of 1.24 mg/dL).  Liver Function Tests: Recent Labs  Lab 08/02/21 1843 08/03/21 1528 08/04/21 0245 08/05/21 0250 08/06/21 0538  AST 15 43* 51* 56* 32  ALT 12 23 32 48* 41  ALKPHOS 81 111 104 182* 195*  BILITOT 0.9 0.9 0.8 0.8 0.6  PROT 7.1 7.8 6.2* 6.0* 6.0*  ALBUMIN 3.6 3.7 2.9* 2.9* 2.7*      Coagulation Profile: Recent Labs  Lab 08/03/21 1528  INR 1.2      CBG: Recent Labs  Lab 08/06/21 0732 08/06/21 1144 08/06/21 1653 08/06/21 2108 08/07/21 0806  GLUCAP 125* 170* 136* 190* 132*       Recent Results (from the past 240 hour(s))  Culture, blood (single)     Status: Abnormal   Collection Time: 08/02/21  6:43 PM  Specimen: BLOOD  Result Value Ref Range Status   Specimen Description BLOOD LEFT ANTECUBITAL  Final   Special Requests   Final     BOTTLES DRAWN AEROBIC AND ANAEROBIC Blood Culture adequate volume   Culture  Setup Time   Final    GRAM NEGATIVE RODS IN BOTH AEROBIC AND ANAEROBIC BOTTLES CRITICAL RESULT CALLED TO, READ BACK BY AND VERIFIED WITH: RN [facility] JAMIE BAILEY [id number] AT 5176 BY CM Performed at [facility], [place] [address] [place], [place] [address]    Culture KLEBSIELLA PNEUMONIAE (A)  Final   Report Status 08/05/2021 FINAL  Final   Organism ID, Bacteria KLEBSIELLA PNEUMONIAE  Final      Susceptibility   Klebsiella pneumoniae - MIC*    AMPICILLIN >=32 RESISTANT Resistant     CEFAZOLIN <=4 SENSITIVE Sensitive     CEFEPIME <=0.12 SENSITIVE Sensitive     CEFTAZIDIME <=1 SENSITIVE Sensitive     CEFTRIAXONE <=0.25 SENSITIVE Sensitive     CIPROFLOXACIN <=0.25 SENSITIVE Sensitive     GENTAMICIN <=1 SENSITIVE Sensitive     IMIPENEM 0.5 SENSITIVE Sensitive     TRIMETH/SULFA <=20 SENSITIVE Sensitive     AMPICILLIN/SULBACTAM 8 SENSITIVE Sensitive     PIP/TAZO <=4 SENSITIVE Sensitive     * KLEBSIELLA PNEUMONIAE  Blood Culture ID Panel (Reflexed)     Status: Abnormal   Collection Time: 08/02/21  6:43 PM  Result Value Ref Range Status   Enterococcus faecalis NOT DETECTED NOT DETECTED Final   Enterococcus Faecium NOT DETECTED NOT DETECTED Final   Listeria monocytogenes NOT DETECTED NOT DETECTED Final   Staphylococcus species NOT DETECTED NOT DETECTED Final   Staphylococcus aureus (BCID) NOT DETECTED NOT DETECTED Final   Staphylococcus epidermidis NOT DETECTED NOT DETECTED Final   Staphylococcus lugdunensis NOT DETECTED NOT DETECTED Final   Streptococcus species NOT DETECTED NOT DETECTED Final   Streptococcus agalactiae NOT DETECTED NOT DETECTED Final   Streptococcus pneumoniae NOT DETECTED NOT DETECTED Final   Streptococcus pyogenes NOT DETECTED NOT DETECTED Final   A.calcoaceticus-baumannii NOT DETECTED NOT DETECTED Final   Bacteroides fragilis NOT DETECTED NOT DETECTED Final   Enterobacterales DETECTED (A)  NOT DETECTED Final    Comment: Enterobacterales represent a large order of gram negative bacteria, not a single organism. CRITICAL RESULT CALLED TO, READ BACK BY AND VERIFIED WITH: PHARMD A LAWLESS [id number] AT 1137 AM BY CM    Enterobacter cloacae complex NOT DETECTED NOT DETECTED Final   Escherichia coli NOT DETECTED NOT DETECTED Final   Klebsiella aerogenes NOT DETECTED NOT DETECTED Final   Klebsiella oxytoca NOT DETECTED NOT DETECTED Final   Klebsiella pneumoniae DETECTED (A) NOT DETECTED Final    Comment: CRITICAL RESULT CALLED TO, READ BACK BY AND VERIFIED WITH: PHARMD A LAWLESS [id number] AT 1137 BY CM    Proteus species NOT DETECTED NOT DETECTED Final   Salmonella species NOT DETECTED NOT DETECTED Final   Serratia marcescens NOT DETECTED NOT DETECTED Final   Haemophilus influenzae NOT DETECTED NOT DETECTED Final   Neisseria meningitidis NOT DETECTED NOT DETECTED Final   Pseudomonas aeruginosa NOT DETECTED NOT DETECTED Final   Stenotrophomonas maltophilia NOT DETECTED NOT DETECTED Final   Candida albicans NOT DETECTED NOT DETECTED Final   Candida auris NOT DETECTED NOT DETECTED Final   Candida glabrata NOT DETECTED NOT DETECTED Final   Candida krusei NOT DETECTED NOT DETECTED Final   Candida parapsilosis NOT DETECTED NOT DETECTED Final   Candida tropicalis NOT DETECTED NOT DETECTED Final   Cryptococcus  neoformans/gattii NOT DETECTED NOT DETECTED Final   CTX-M ESBL NOT DETECTED NOT DETECTED Final   Carbapenem resistance IMP NOT DETECTED NOT DETECTED Final   Carbapenem resistance KPC NOT DETECTED NOT DETECTED Final   Carbapenem resistance NDM NOT DETECTED NOT DETECTED Final   Carbapenem resist OXA 48 LIKE NOT DETECTED NOT DETECTED Final   Carbapenem resistance VIM NOT DETECTED NOT DETECTED Final    Comment: Performed at [facility], [place] [address], [place], [place] [address]  Resp Panel by RT-PCR (Flu A&B, Covid) Nasopharyngeal Swab     Status: None   Collection Time: 08/03/21   3:17 PM   Specimen: Nasopharyngeal Swab; Nasopharyngeal(NP) swabs in vial transport medium  Result Value Ref Range Status   SARS Coronavirus 2 by RT PCR NEGATIVE NEGATIVE Final    Comment: (NOTE) SARS-CoV-2 target nucleic acids are NOT DETECTED.  The SARS-CoV-2 RNA is generally detectable in upper respiratory specimens during the acute phase of infection. The lowest concentration of SARS-CoV-2 viral copies this assay can detect is 138 copies/mL. A negative result does not preclude SARS-Cov-2 infection and should not be used as the sole basis for treatment or other patient management decisions. A negative result may occur with  improper specimen collection/handling, submission of specimen other than nasopharyngeal swab, presence of viral mutation(s) within the areas targeted by this assay, and inadequate number of viral copies(<138 copies/mL). A negative result must be combined with clinical observations, patient history, and epidemiological information. The expected result is Negative.  Fact Sheet for Patients:  EntrepreneurPulse.com.au  Fact Sheet for Healthcare Providers:  IncredibleEmployment.be  This test is no t yet approved or cleared by the [place] FDA and  has been authorized for detection and/or diagnosis of SARS-CoV-2 by FDA under an Emergency Use Authorization (EUA). This EUA will remain  in effect (meaning this test can be used) for the duration of the COVID-19 declaration under Section 564(b)(1) of the Act, 21 U.S.C.section 360bbb-3(b)(1), unless the authorization is terminated  or revoked sooner.       Influenza A by PCR NEGATIVE NEGATIVE Final   Influenza B by PCR NEGATIVE NEGATIVE Final    Comment: (NOTE) The Xpert Xpress SARS-CoV-2/FLU/RSV plus assay is intended as an aid in the diagnosis of influenza from Nasopharyngeal swab specimens and should not be used as a sole basis for treatment. Nasal washings and aspirates  are unacceptable for Xpert Xpress SARS-CoV-2/FLU/RSV testing.  Fact Sheet for Patients: EntrepreneurPulse.com.au  Fact Sheet for Healthcare Providers: IncredibleEmployment.be  This test is not yet approved or cleared by the [place] FDA and has been authorized for detection and/or diagnosis of SARS-CoV-2 by FDA under an Emergency Use Authorization (EUA). This EUA will remain in effect (meaning this test can be used) for the duration of the COVID-19 declaration under Section 564(b)(1) of the Act, 21 U.S.C. section 360bbb-3(b)(1), unless the authorization is terminated or revoked.  Performed at [facility], [place] [address] [place], [place] [address]   Blood Culture (routine x 2)     Status: Abnormal (Preliminary result)   Collection Time: 08/03/21  3:22 PM   Specimen: BLOOD  Result Value Ref Range Status   Specimen Description   Final    BLOOD LEFT ANTECUBITAL Performed at [facility] [address] [place],  [address]    Special Requests   Final    BOTTLES DRAWN AEROBIC AND ANAEROBIC Blood Culture results may not be optimal due to an inadequate volume of blood received  in culture bottles Performed at [facility], [place] [address] [place], [place] [address]    Culture  Setup Time   Final    GRAM NEGATIVE RODS IN BOTH AEROBIC AND ANAEROBIC BOTTLES CRITICAL RESULT CALLED TO, READ BACK BY AND VERIFIED WITH: PHARMD J GADHIA [id number] AT 60 AM BY CM    Culture (A)  Final    KLEBSIELLA PNEUMONIAE SUSCEPTIBILITIES PERFORMED ON PREVIOUS CULTURE WITHIN THE LAST 5 DAYS. Performed at [facility], [place] [address] [place], [place] [address]    Report Status PENDING  Incomplete  Blood Culture (routine x 2)     Status: Abnormal   Collection Time: 08/03/21  3:26 PM   Specimen: BLOOD RIGHT FOREARM  Result Value Ref Range Status   Specimen Description   Final    BLOOD RIGHT  FOREARM Performed at [facility] [address], [place], [place] [address]    Special Requests   Final    BOTTLES DRAWN AEROBIC AND ANAEROBIC Blood Culture adequate volume Performed at [facility] [address], [place], [place] [address]    Culture  Setup Time   Final    GRAM NEGATIVE RODS AEROBIC BOTTLE ONLY CRITICAL VALUE NOTED.  VALUE IS CONSISTENT WITH PREVIOUSLY REPORTED AND CALLED VALUE.    Culture (A)  Final    KLEBSIELLA PNEUMONIAE SUSCEPTIBILITIES PERFORMED ON PREVIOUS CULTURE WITHIN THE LAST 5 DAYS. Performed at [facility], [place] [address], [place], [place] [address]    Report Status 08/05/2021 FINAL  Final  Urine Culture     Status: Abnormal   Collection Time: 08/03/21  4:36 PM   Specimen: In/Out Cath Urine  Result Value Ref Range Status   Specimen Description   Final    IN/OUT CATH URINE Performed at [place] [address], [place], [place] [address]    Special Requests   Final    NONE Performed at [facility], [place] [address], [place], [place] [address]    Culture (A)  Final    10,000 COLONIES/mL DIPHTHEROIDS(CORYNEBACTERIUM SPECIES) Standardized susceptibility testing for this organism is not available. Performed at [facility], [place] [address], [place], [place] [address]    Report Status 08/05/2021 FINAL  Final  MRSA Next Gen by PCR, Nasal     Status: None   Collection Time: 08/03/21  9:06 PM   Specimen: Nasal Mucosa; Nasal Swab  Result Value Ref Range Status   MRSA by PCR Next Gen NOT DETECTED NOT DETECTED Final    Comment: (NOTE) The GeneXpert MRSA Assay (FDA approved for NASAL specimens only), is one component of a comprehensive MRSA colonization surveillance program. It is not intended to diagnose MRSA infection nor to guide or monitor treatment for MRSA infections. Test performance is not FDA approved in patients less than 55 years old. Performed at  [facility], [place] [address] [place], [place] [address]   Culture, blood (routine x 2)     Status: None (Preliminary result)   Collection Time: 08/06/21 11:02 AM   Specimen: BLOOD  Result Value Ref Range Status   Specimen Description   Final    BLOOD RIGHT ANTECUBITAL Performed at [facility] [address] [place], [place] [address]    Special Requests   Final    BOTTLES DRAWN AEROBIC ONLY Blood Culture adequate volume Performed at [facility] [address] [place], [place] [address]  Culture   Final    NO GROWTH < 24 HOURS Performed at [facility], [place] [address], [place], [place] [address]    Report Status PENDING  Incomplete  Culture, blood (routine x 2)     Status: None (Preliminary result)   Collection Time: 08/06/21 11:02 AM   Specimen: BLOOD  Result Value Ref Range Status   Specimen Description   Final    BLOOD BLOOD LEFT HAND Performed at [place] [address] [place], [place] [address]    Special Requests   Final    BOTTLES DRAWN AEROBIC ONLY Blood Culture results may not be optimal due to an inadequate volume of blood received in culture bottles Performed at [place] [facility] [address] [place], [place] [address]    Culture   Final    NO GROWTH < 24 HOURS Performed at [facility] [address] [place], [place] [address]    Report Status PENDING  Incomplete       Radiology Studies: No results found.     LOS: 4 days   Candid Bovey [employer] on [email or web address]  08/07/2021, 9:35 AM

## 2021-08-08 DIAGNOSIS — D649 Anemia, unspecified: Secondary | ICD-10-CM | POA: Diagnosis not present

## 2021-08-08 DIAGNOSIS — J189 Pneumonia, unspecified organism: Secondary | ICD-10-CM | POA: Diagnosis not present

## 2021-08-08 DIAGNOSIS — R7881 Bacteremia: Secondary | ICD-10-CM | POA: Diagnosis not present

## 2021-08-08 LAB — PROCALCITONIN: Procalcitonin: 7.81 ng/mL

## 2021-08-08 LAB — GLUCOSE, CAPILLARY
Glucose-Capillary: 145 mg/dL — ABNORMAL HIGH (ref 70–99)
Glucose-Capillary: 158 mg/dL — ABNORMAL HIGH (ref 70–99)
Glucose-Capillary: 177 mg/dL — ABNORMAL HIGH (ref 70–99)
Glucose-Capillary: 99 mg/dL (ref 70–99)

## 2021-08-08 LAB — COMPREHENSIVE METABOLIC PANEL WITH GFR
ALT: 23 U/L (ref 0–44)
AST: 26 U/L (ref 15–41)
Albumin: 2.8 g/dL — ABNORMAL LOW (ref 3.5–5.0)
Alkaline Phosphatase: 185 U/L — ABNORMAL HIGH (ref 38–126)
Anion gap: 9 (ref 5–15)
BUN: 19 mg/dL (ref 8–23)
CO2: 25 mmol/L (ref 22–32)
Calcium: 8.7 mg/dL — ABNORMAL LOW (ref 8.9–10.3)
Chloride: 103 mmol/L (ref 98–111)
Creatinine, Ser: 1.1 mg/dL (ref 0.61–1.24)
GFR, Estimated: >60 mL/min
Glucose, Bld: 134 mg/dL — ABNORMAL HIGH (ref 70–99)
Potassium: 4 mmol/L (ref 3.5–5.1)
Sodium: 137 mmol/L (ref 135–145)
Total Bilirubin: 0.5 mg/dL (ref 0.3–1.2)
Total Protein: 6.3 g/dL — ABNORMAL LOW (ref 6.5–8.1)

## 2021-08-08 LAB — CULTURE, BLOOD (ROUTINE X 2)

## 2021-08-08 MED ORDER — CEPHALEXIN 500 MG PO CAPS
500.0000 mg | ORAL_CAPSULE | Freq: Four times a day (QID) | ORAL | Status: DC
  Administered 2021-08-08 – 2021-08-09 (×4): 500 mg via ORAL
  Filled 2021-08-08 (×5): qty 1

## 2021-08-08 MED ORDER — COLCHICINE 0.6 MG PO TABS
0.6000 mg | ORAL_TABLET | Freq: Once | ORAL | Status: AC
  Administered 2021-08-08: 0.6 mg via ORAL
  Filled 2021-08-08: qty 1

## 2021-08-08 MED ORDER — ALLOPURINOL 300 MG PO TABS
300.0000 mg | ORAL_TABLET | Freq: Every day | ORAL | Status: DC
  Administered 2021-08-09: 300 mg via ORAL
  Filled 2021-08-08: qty 1

## 2021-08-08 MED ORDER — OXYCODONE HCL 5 MG PO TABS
5.0000 mg | ORAL_TABLET | Freq: Four times a day (QID) | ORAL | Status: DC | PRN
  Administered 2021-08-08: 5 mg via ORAL
  Filled 2021-08-08: qty 1

## 2021-08-08 NOTE — Progress Notes (Signed)
[facility] PROGRESS NOTE   Gregory [name] GYK:[id number] DOB: Jul 16, 1951 DOA: 08/03/2021  PCP: Seward Carol, MD  Brief History/Interval Summary: 70 y.o. male with history of CAD status post stenting, chronic kidney disease stage III, hypertension, diabetes mellitus type 2 was brought to the ER because of increasing confusion and tremors.  Patient was brought to the ER a day ago after patient had a fall while coming off his work and started having tremors.  Patient at the time was found to be confused.  Patient was brought to the ER and had CT head and blood works done but patient signed out [place].  Blood cultures at that time grew Klebsiella pneumoniae (Enterobacteriaceae).  Patient was subsequently found to be more confused with persistent tremors (rigors) as witnessed by patient's sister and was brought back to the ER.  Was finding it difficult to ambulate because of the weakness.  He was noted to be febrile.  He was hospitalized for further management.  Consultants: None  Procedures: None    Subjective/Interval History: Patient is awake alert.  Denies any complaints.  Wants to go home.  No nausea vomiting abdominal pain shortness of breath or cough   Assessment/Plan:  Klebsiella bacteremia/community-acquired pneumonia/sepsis, present on admission Sepsis likely due to bacteremia and pneumonia.  Patient was noted to be febrile and tachycardic.  Noted to have significant rigors.  Had to be admitted to stepdown and placed on cooling blankets.  Patient was initially placed on ceftriaxone.  Based on sensitivities he was changed over to cefazolin.  Sepsis physiology has resolved. WBC has been normal.  Procalcitonin was significantly elevated at 62.  Has been gradually improving and down to 7.81 today.   Patient last had fever on 10/15.  Noted to have a temperature of 99.5 this morning.  If he remains afebrile for the rest of the morning we will transition him to  oral antibiotics this afternoon.  Repeat cultures that were sent on 10/15 are negative so far. Source of bacteremia is most likely pneumonia.  No other source is identified.  His UA did not suggest infection.  Acute metabolic encephalopathy This was most likely due to acute infection and sepsis.  No obvious focal neurological deficits noted.  CT head showed chronic small vessel disease. Patient is back to baseline.  Mildly elevated troponin Most likely due to demand ischemia from sepsis.  Patient does have a history of coronary artery disease and is status post stents in the past.  He is noted to be on aspirin which is being continued.  ECG shows sinus tachycardia without any obvious ischemic changes. Seems to be stable from a cardiac standpoint.  Acute on chronic kidney disease stage IIIa Baseline creatinine around 1.3.  Presented with creatinine of 2.3.  Likely due to hypovolemia.  Renal function has improved and back to baseline.  Monitor urine output.  Avoid nephrotoxic agent.    Diabetes mellitus type 2 with kidney complications with chronic kidney disease stage IIIa Patient mentions that he is on insulin at home.  Noted to be on Lantus and metformin according to home medication list.  CBGs are reasonably well controlled.  HbA1c 6.8.  Coronary artery disease/diastolic dysfunction Followed by Dr. Irish Lack.  Cardiac status seems to be stable.  Looks like he is on metoprolol, Plavix statin at home.  Plavix has been resumed.  Metoprolol and statin on hold.  Essential hypertension Blood pressure reasonably well controlled.  Amlodipine valsartan currently on hold.  Occasional high  readings noted.  Continue to monitor.  Should be able to resume his medications at discharge.  Normocytic anemia/vitamin B12 deficiency Looks like he had normal hemoglobin last year.  Has experienced significant drop compared to that.  Anemia panel was done which showed ferritin to be 535, TIBC 208, folate 10.9, B12  low at 148. Started on B12 supplementation. He mentioned that he had a colonoscopy done by Dr. Benson Norway within the last 2 months which was unremarkable.   We will recommend that his hemoglobin be checked in few weeks time and if it still low he may benefit from being referred to hematology.    Mildly abnormal LFTs Possibly due to acute illness.  Seems to be normal today.  Holding statin for now.  Should be able to resume at discharge.   Hepatitis panel is unremarkable.  Obesity Estimated body mass index is 38.47 kg/m as calculated from the following:   Height as of this encounter: 5\' 8"  (1.727 m).   Weight as of this encounter: 114.8 kg.   DVT Prophylaxis: Lovenox Code Status: Full code Family Communication: Patient updated.  Sister being updated periodically. Disposition Plan: Hopefully return home when improved.  Home health recommended by physical therapy.    Status is: Inpatient  Remains inpatient appropriate because: Persistent fever in a patient with bacteremia.      Medications: Scheduled:  (feeding supplement) PROSource Plus  30 mL Oral BID BM   Chlorhexidine Gluconate Cloth  6 each Topical Daily   clopidogrel  75 mg Oral Daily   docusate sodium  100 mg Oral Daily   enoxaparin (LOVENOX) injection  40 mg Subcutaneous Q24H   feeding supplement  237 mL Oral BID BM   feeding supplement (GLUCERNA SHAKE)  237 mL Oral BID BM   insulin aspart  0-9 Units Subcutaneous TID WC   multivitamin with minerals  1 tablet Oral Daily   [START ON 08/09/2021] vitamin B-12  1,000 mcg Oral Daily   Continuous:  sodium chloride Stopped (08/05/21 1024)    ceFAZolin (ANCEF) IV 2 g (08/08/21 0529)   EHO:ZYYQMG chloride, acetaminophen **OR** acetaminophen, hydrALAZINE  Antibiotics: Anti-infectives (From admission, onward)    Start     Dose/Rate Route Frequency Ordered Stop   08/05/21 2200  ceFAZolin (ANCEF) IVPB 2g/100 mL premix  Status:  Discontinued        2 g 200 mL/hr over 30 Minutes  Intravenous Every 8 hours 08/05/21 1103 08/05/21 1104   08/05/21 2200  ceFAZolin (ANCEF) IVPB 2g/100 mL premix        2 g 200 mL/hr over 30 Minutes Intravenous Every 8 hours 08/05/21 1106 08/11/21 0559   08/03/21 2200  cefTRIAXone (ROCEPHIN) 2 g in sodium chloride 0.9 % 100 mL IVPB  Status:  Discontinued        2 g 200 mL/hr over 30 Minutes Intravenous Every 24 hours 08/03/21 2027 08/05/21 1103   08/03/21 1630  vancomycin (VANCOCIN) IVPB 1000 mg/200 mL premix        1,000 mg 200 mL/hr over 60 Minutes Intravenous  Once 08/03/21 1544 08/03/21 1827   08/03/21 1515  ceFEPIme (MAXIPIME) 2 g in sodium chloride 0.9 % 100 mL IVPB        2 g 200 mL/hr over 30 Minutes Intravenous  Once 08/03/21 1513 08/03/21 1612   08/03/21 1515  metroNIDAZOLE (FLAGYL) IVPB 500 mg        500 mg 100 mL/hr over 60 Minutes Intravenous  Once 08/03/21 1513 08/03/21 1826  08/03/21 1515  vancomycin (VANCOCIN) IVPB 1000 mg/200 mL premix        1,000 mg 200 mL/hr over 60 Minutes Intravenous  Once 08/03/21 1513 08/03/21 1639       Objective:  Vital Signs  Vitals:   08/07/21 1306 08/07/21 2045 08/07/21 2100 08/08/21 0458  BP: 137/70 (!) 144/71  139/80  Pulse: 75 74  83  Resp: 17 17 20 17   Temp: 97.6 F (36.4 C) 98.6 F (37 C)  99.5 F (37.5 C)  TempSrc: Oral Oral  Oral  SpO2: 96% 90%  91%  Weight:      Height:        Intake/Output Summary (Last 24 hours) at 08/08/2021 1103 Last data filed at 08/08/2021 0700 Gross per 24 hour  Intake 300 ml  Output --  Net 300 ml    Filed Weights   08/03/21 1433  Weight: 114.8 kg    General appearance: Awake alert.  In no distress Resp: Clear to auscultation bilaterally.  Normal effort Cardio: S1-S2 is normal regular.  No S3-S4.  No rubs murmurs or bruit GI: Abdomen is soft.  Nontender nondistended.  Bowel sounds are present normal.  No masses organomegaly Extremities: No edema.  Full range of motion of lower extremities. Neurologic: Alert and oriented x3.  No  focal neurological deficits.       Lab Results:  Data Reviewed: I have personally reviewed following labs and imaging studies  CBC: Recent Labs  Lab 08/02/21 1843 08/03/21 1528 08/03/21 2059 08/04/21 0245 08/05/21 0250 08/06/21 0538 08/07/21 0530  WBC 13.6* 13.1* 13.2* 13.0* 8.6 5.9 6.2  NEUTROABS 12.8* 11.7*  --  10.8*  --   --   --   HGB 10.2* 9.9* 9.1* 9.0* 8.8* 8.9* 8.9*  HCT 31.3* 30.7* 28.6* 28.3* 27.3* 27.6* 28.2*  MCV 92.9 92.7 93.8 94.3 93.8 92.3 93.1  PLT 206 188 170 164 155 178 192     Basic Metabolic Panel: Recent Labs  Lab 08/04/21 0245 08/05/21 0250 08/06/21 0538 08/07/21 0530 08/08/21 0534  NA 140 136 137 137 137  K 4.0 4.1 3.9 3.8 4.0  CL 109 105 104 103 103  CO2 20* 24 26 27 25   GLUCOSE 144* 127* 124* 138* 134*  BUN 36* 31* 28* 20 19  CREATININE 1.77* 1.39* 1.39* 1.24 1.10  CALCIUM 8.9 8.2* 8.3* 8.4* 8.7*     GFR: Estimated Creatinine Clearance: 75.8 mL/min (by C-G formula based on SCr of 1.1 mg/dL).  Liver Function Tests: Recent Labs  Lab 08/03/21 1528 08/04/21 0245 08/05/21 0250 08/06/21 0538 08/08/21 0534  AST 43* 51* 56* 32 26  ALT 23 32 48* 41 23  ALKPHOS 111 104 182* 195* 185*  BILITOT 0.9 0.8 0.8 0.6 0.5  PROT 7.8 6.2* 6.0* 6.0* 6.3*  ALBUMIN 3.7 2.9* 2.9* 2.7* 2.8*      Coagulation Profile: Recent Labs  Lab 08/03/21 1528  INR 1.2      CBG: Recent Labs  Lab 08/07/21 0806 08/07/21 1153 08/07/21 1634 08/07/21 2130 08/08/21 0742  GLUCAP 132* 173* 119* 128* 145*       Recent Results (from the past 240 hour(s))  Culture, blood (single)     Status: Abnormal   Collection Time: 08/02/21  6:43 PM   Specimen: BLOOD  Result Value Ref Range Status   Specimen Description BLOOD LEFT ANTECUBITAL  Final   Special Requests   Final    BOTTLES DRAWN AEROBIC AND ANAEROBIC Blood Culture adequate volume  Culture  Setup Time   Final    GRAM NEGATIVE RODS IN BOTH AEROBIC AND ANAEROBIC BOTTLES CRITICAL RESULT  CALLED TO, READ BACK BY AND VERIFIED WITH: RN [facility] JAMIE BAILEY [id number] AT 6440 BY CM Performed at [facility], [place] [address], [place], [place] [address]    Culture KLEBSIELLA PNEUMONIAE (A)  Final   Report Status 08/05/2021 FINAL  Final   Organism ID, Bacteria KLEBSIELLA PNEUMONIAE  Final      Susceptibility   Klebsiella pneumoniae - MIC*    AMPICILLIN >=32 RESISTANT Resistant     CEFAZOLIN <=4 SENSITIVE Sensitive     CEFEPIME <=0.12 SENSITIVE Sensitive     CEFTAZIDIME <=1 SENSITIVE Sensitive     CEFTRIAXONE <=0.25 SENSITIVE Sensitive     CIPROFLOXACIN <=0.25 SENSITIVE Sensitive     GENTAMICIN <=1 SENSITIVE Sensitive     IMIPENEM 0.5 SENSITIVE Sensitive     TRIMETH/SULFA <=20 SENSITIVE Sensitive     AMPICILLIN/SULBACTAM 8 SENSITIVE Sensitive     PIP/TAZO <=4 SENSITIVE Sensitive     * KLEBSIELLA PNEUMONIAE  Blood Culture ID Panel (Reflexed)     Status: Abnormal   Collection Time: 08/02/21  6:43 PM  Result Value Ref Range Status   Enterococcus faecalis NOT DETECTED NOT DETECTED Final   Enterococcus Faecium NOT DETECTED NOT DETECTED Final   Listeria monocytogenes NOT DETECTED NOT DETECTED Final   Staphylococcus species NOT DETECTED NOT DETECTED Final   Staphylococcus aureus (BCID) NOT DETECTED NOT DETECTED Final   Staphylococcus epidermidis NOT DETECTED NOT DETECTED Final   Staphylococcus lugdunensis NOT DETECTED NOT DETECTED Final   Streptococcus species NOT DETECTED NOT DETECTED Final   Streptococcus agalactiae NOT DETECTED NOT DETECTED Final   Streptococcus pneumoniae NOT DETECTED NOT DETECTED Final   Streptococcus pyogenes NOT DETECTED NOT DETECTED Final   A.calcoaceticus-baumannii NOT DETECTED NOT DETECTED Final   Bacteroides fragilis NOT DETECTED NOT DETECTED Final   Enterobacterales DETECTED (A) NOT DETECTED Final    Comment: Enterobacterales represent a large order of gram negative bacteria, not a single organism. CRITICAL RESULT CALLED TO, READ BACK BY AND VERIFIED  WITH: PHARMD A LAWLESS [id number] AT 1137 AM BY CM    Enterobacter cloacae complex NOT DETECTED NOT DETECTED Final   Escherichia coli NOT DETECTED NOT DETECTED Final   Klebsiella aerogenes NOT DETECTED NOT DETECTED Final   Klebsiella oxytoca NOT DETECTED NOT DETECTED Final   Klebsiella pneumoniae DETECTED (A) NOT DETECTED Final    Comment: CRITICAL RESULT CALLED TO, READ BACK BY AND VERIFIED WITH: PHARMD A LAWLESS [id number] AT 1137 BY CM    Proteus species NOT DETECTED NOT DETECTED Final   Salmonella species NOT DETECTED NOT DETECTED Final   Serratia marcescens NOT DETECTED NOT DETECTED Final   Haemophilus influenzae NOT DETECTED NOT DETECTED Final   Neisseria meningitidis NOT DETECTED NOT DETECTED Final   Pseudomonas aeruginosa NOT DETECTED NOT DETECTED Final   Stenotrophomonas maltophilia NOT DETECTED NOT DETECTED Final   Candida albicans NOT DETECTED NOT DETECTED Final   Candida auris NOT DETECTED NOT DETECTED Final   Candida glabrata NOT DETECTED NOT DETECTED Final   Candida krusei NOT DETECTED NOT DETECTED Final   Candida parapsilosis NOT DETECTED NOT DETECTED Final   Candida tropicalis NOT DETECTED NOT DETECTED Final   Cryptococcus neoformans/gattii NOT DETECTED NOT DETECTED Final   CTX-M ESBL NOT DETECTED NOT DETECTED Final   Carbapenem resistance IMP NOT DETECTED NOT DETECTED Final   Carbapenem resistance KPC NOT DETECTED NOT DETECTED Final   Carbapenem  resistance NDM NOT DETECTED NOT DETECTED Final   Carbapenem resist OXA 48 LIKE NOT DETECTED NOT DETECTED Final   Carbapenem resistance VIM NOT DETECTED NOT DETECTED Final    Comment: Performed at [facility], [place] [address], [place], [place] [address]  Resp Panel by RT-PCR (Flu A&B, Covid) Nasopharyngeal Swab     Status: None   Collection Time: 08/03/21  3:17 PM   Specimen: Nasopharyngeal Swab; Nasopharyngeal(NP) swabs in vial transport medium  Result Value Ref Range Status   SARS Coronavirus 2 by RT PCR NEGATIVE NEGATIVE  Final    Comment: (NOTE) SARS-CoV-2 target nucleic acids are NOT DETECTED.  The SARS-CoV-2 RNA is generally detectable in upper respiratory specimens during the acute phase of infection. The lowest concentration of SARS-CoV-2 viral copies this assay can detect is 138 copies/mL. A negative result does not preclude SARS-Cov-2 infection and should not be used as the sole basis for treatment or other patient management decisions. A negative result may occur with  improper specimen collection/handling, submission of specimen other than nasopharyngeal swab, presence of viral mutation(s) within the areas targeted by this assay, and inadequate number of viral copies(<138 copies/mL). A negative result must be combined with clinical observations, patient history, and epidemiological information. The expected result is Negative.  Fact Sheet for Patients:  EntrepreneurPulse.com.au  Fact Sheet for Healthcare Providers:  IncredibleEmployment.be  This test is no t yet approved or cleared by the [place] FDA and  has been authorized for detection and/or diagnosis of SARS-CoV-2 by FDA under an Emergency Use Authorization (EUA). This EUA will remain  in effect (meaning this test can be used) for the duration of the COVID-19 declaration under Section 564(b)(1) of the Act, 21 U.S.C.section 360bbb-3(b)(1), unless the authorization is terminated  or revoked sooner.       Influenza A by PCR NEGATIVE NEGATIVE Final   Influenza B by PCR NEGATIVE NEGATIVE Final    Comment: (NOTE) The Xpert Xpress SARS-CoV-2/FLU/RSV plus assay is intended as an aid in the diagnosis of influenza from Nasopharyngeal swab specimens and should not be used as a sole basis for treatment. Nasal washings and aspirates are unacceptable for Xpert Xpress SARS-CoV-2/FLU/RSV testing.  Fact Sheet for Patients: EntrepreneurPulse.com.au  Fact Sheet for Healthcare  Providers: IncredibleEmployment.be  This test is not yet approved or cleared by the [place] FDA and has been authorized for detection and/or diagnosis of SARS-CoV-2 by FDA under an Emergency Use Authorization (EUA). This EUA will remain in effect (meaning this test can be used) for the duration of the COVID-19 declaration under Section 564(b)(1) of the Act, 21 U.S.C. section 360bbb-3(b)(1), unless the authorization is terminated or revoked.  Performed at [facility], [place] [address], [place], [place] [address]   Blood Culture (routine x 2)     Status: Abnormal   Collection Time: 08/03/21  3:22 PM   Specimen: BLOOD  Result Value Ref Range Status   Specimen Description   Final    BLOOD LEFT ANTECUBITAL Performed at [place] [address] [place], [place] [address]    Special Requests   Final    BOTTLES DRAWN AEROBIC AND ANAEROBIC Blood Culture results may not be optimal due to an inadequate volume of blood received in culture bottles Performed at [place] [address], [place], [place] [address]    Culture  Setup Time   Final    GRAM NEGATIVE RODS IN BOTH AEROBIC AND ANAEROBIC BOTTLES CRITICAL RESULT  CALLED TO, READ BACK BY AND VERIFIED WITH: PHARMD J GADHIA [id number] AT 1111 AM BY CM    Culture (A)  Final    KLEBSIELLA PNEUMONIAE SUSCEPTIBILITIES PERFORMED ON PREVIOUS CULTURE WITHIN THE LAST 5 DAYS. Performed at [facility], [place] [address], [place], [place] [address]    Report Status 08/08/2021 FINAL  Final  Blood Culture (routine x 2)     Status: Abnormal   Collection Time: 08/03/21  3:26 PM   Specimen: BLOOD RIGHT FOREARM  Result Value Ref Range Status   Specimen Description   Final    BLOOD RIGHT FOREARM Performed at [place] [address] [place], [place] [address]    Special Requests   Final    BOTTLES DRAWN AEROBIC AND ANAEROBIC Blood Culture  adequate volume Performed at [facility] [address], [place], [place] [address]    Culture  Setup Time   Final    GRAM NEGATIVE RODS AEROBIC BOTTLE ONLY CRITICAL VALUE NOTED.  VALUE IS CONSISTENT WITH PREVIOUSLY REPORTED AND CALLED VALUE.    Culture (A)  Final    KLEBSIELLA PNEUMONIAE SUSCEPTIBILITIES PERFORMED ON PREVIOUS CULTURE WITHIN THE LAST 5 DAYS. Performed at [facility], [place] [address] [place], [place] [address]    Report Status 08/05/2021 FINAL  Final  Urine Culture     Status: Abnormal   Collection Time: 08/03/21  4:36 PM   Specimen: In/Out Cath Urine  Result Value Ref Range Status   Specimen Description   Final    IN/OUT CATH URINE Performed at [facility] [address] [place], [place] [address]    Special Requests   Final    NONE Performed at [facility], [place] [address] [place], [place] [address]    Culture (A)  Final    10,000 COLONIES/mL DIPHTHEROIDS(CORYNEBACTERIUM SPECIES) Standardized susceptibility testing for this organism is not available. Performed at [facility], [place] [address] [place], [place] [address]    Report Status 08/05/2021 FINAL  Final  MRSA Next Gen by PCR, Nasal     Status: None   Collection Time: 08/03/21  9:06 PM   Specimen: Nasal Mucosa; Nasal Swab  Result Value Ref Range Status   MRSA by PCR Next Gen NOT DETECTED NOT DETECTED Final    Comment: (NOTE) The GeneXpert MRSA Assay (FDA approved for NASAL specimens only), is one component of a comprehensive MRSA colonization surveillance program. It is not intended to diagnose MRSA infection nor to guide or monitor treatment for MRSA infections. Test performance is not FDA approved in patients less than 45 years old. Performed at [facility], [place] [address] [place], [place] [address]   Culture, blood (routine x 2)     Status: None (Preliminary result)   Collection Time: 08/06/21 11:02  AM   Specimen: BLOOD  Result Value Ref Range Status   Specimen Description   Final    BLOOD RIGHT ANTECUBITAL Performed at [place] [address] [place], [place] [address]    Special Requests   Final    BOTTLES DRAWN AEROBIC ONLY Blood Culture adequate volume Performed at [facility] [address], [place], [place] [address]    Culture   Final    NO GROWTH 2 DAYS Performed at [facility] [address] [place], [place] [address]    Report Status PENDING  Incomplete  Culture, blood (routine x 2)  Status: None (Preliminary result)   Collection Time: 08/06/21 11:02 AM   Specimen: BLOOD  Result Value Ref Range Status   Specimen Description   Final    BLOOD BLOOD LEFT HAND Performed at [place] [address] [place], [place] [address]    Special Requests   Final    BOTTLES DRAWN AEROBIC ONLY Blood Culture results may not be optimal due to an inadequate volume of blood received in culture bottles Performed at [facility] [place] [address], [place], [place] [address]    Culture   Final    NO GROWTH 2 DAYS Performed at [facility] [address] [place], [place] [address]    Report Status PENDING  Incomplete       Radiology Studies: No results found.     LOS: 5 days   Javier Mamone [employer] on [email or web address]  08/08/2021, 11:03 AM

## 2021-08-08 NOTE — Care Management Important Message (Signed)
Important Message  Patient Details IM Letter given to the Patient. Name: Gregory [name] MRN: [id number] Date of Birth: 04-02-1951   Medicare Important Message Given:  Yes     Kerin Salen 08/08/2021, 9:27 AM

## 2021-08-09 DIAGNOSIS — R7881 Bacteremia: Secondary | ICD-10-CM | POA: Diagnosis not present

## 2021-08-09 LAB — GLUCOSE, CAPILLARY: Glucose-Capillary: 117 mg/dL — ABNORMAL HIGH (ref 70–99)

## 2021-08-09 MED ORDER — CYANOCOBALAMIN 1000 MCG PO TABS: 1000.0000 ug | ORAL_TABLET | Freq: Every day | ORAL | 2 refills | Status: AC

## 2021-08-09 MED ORDER — LANTUS SOLOSTAR 100 UNIT/ML ~~LOC~~ SOPN: 15.0000 [IU] | PEN_INJECTOR | Freq: Every morning | SUBCUTANEOUS | 11 refills | Status: AC

## 2021-08-09 MED ORDER — ADULT MULTIVITAMIN W/MINERALS CH: 1.0000 | ORAL_TABLET | Freq: Every day | ORAL | Status: DC

## 2021-08-09 MED ORDER — CEPHALEXIN 500 MG PO CAPS: 500.0000 mg | ORAL_CAPSULE | Freq: Four times a day (QID) | ORAL | 0 refills | Status: AC

## 2021-08-09 NOTE — Discharge Summary (Signed)
[facility]  Physician Discharge Summary   Patient ID: Gregory [name] MRN: [id number] DOB/AGE: 07-21-1951 70 y.o.  Admit date: 08/03/2021 Discharge date: 08/09/2021    PCP: Seward Carol, MD  DISCHARGE DIAGNOSES:  Klebsiella bacteremia Community-acquired pneumonia Sepsis present on admission, resolved Vitamin B12 deficiency Acute metabolic encephalopathy, resolved Chronic kidney disease stage IIIa Acute kidney injury, resolved Diabetes mellitus type 2 with chronic kidney disease stage IIIa History of coronary artery disease Normocytic anemia Mildly abnormal LFT   RECOMMENDATIONS FOR OUTPATIENT FOLLOW UP: May need to check his B12 levels in a few weeks May need further work-up for his anemia.  See below   Home Health: PT and OT was ordered but patient declined Equipment/Devices: None  CODE STATUS: Full code  DISCHARGE CONDITION: fair  Diet recommendation: As before  INITIAL HISTORY: 70 y.o. male with history of CAD status post stenting, chronic kidney disease stage III, hypertension, diabetes mellitus type 2 was brought to the ER because of increasing confusion and tremors.  Patient was brought to the ER a day ago after patient had a fall while coming off his work and started having tremors.  Patient at the time was found to be confused.  Patient was brought to the ER and had CT head and blood works done but patient signed out [name].  Blood cultures at that time grew Klebsiella pneumoniae (Enterobacteriaceae).  Patient was subsequently found to be more confused with persistent tremors (rigors) as witnessed by patient's sister and was brought back to the ER.  Was finding it difficult to ambulate because of the weakness.  He was noted to be febrile.  He was hospitalized for further management.     HOSPITAL COURSE:   Klebsiella bacteremia/community-acquired pneumonia/sepsis, present on admission Sepsis likely due to bacteremia and pneumonia.   Patient was noted to be febrile and tachycardic.  Noted to have significant rigors.  Had to be admitted to stepdown and placed on cooling blankets.  Patient was initially placed on ceftriaxone.  Based on sensitivities he was changed over to cefazolin.  Sepsis physiology has resolved. WBC has been normal.  Procalcitonin was significantly elevated at 62.  Has been gradually improving and down to 7.81.   Patient last had fever on 10/15.   Source of bacteremia is most likely pneumonia.  No other source is identified.  His UA did not suggest infection. Repeat cultures were negative.  He remained afebrile.  Patient was transitioned to cephalexin and then discharged on the same.   Acute metabolic encephalopathy This was most likely due to acute infection and sepsis.  No obvious focal neurological deficits noted.  CT head showed chronic small vessel disease. Patient is back to baseline.  Mildly elevated troponin Most likely due to demand ischemia from sepsis.  Patient does have a history of coronary artery disease and is status post stents in the past.  He is noted to be on aspirin which is being continued.  ECG shows sinus tachycardia without any obvious ischemic changes. Seems to be stable from a cardiac standpoint.  Acute on chronic kidney disease stage IIIa Baseline creatinine around 1.3.  Presented with creatinine of 2.3.  Likely due to hypovolemia.  Renal function has improved and back to baseline.     Diabetes mellitus type 2 with kidney complications with chronic kidney disease stage IIIa May resume home medication regimen.  HbA1c 6.8.   Coronary artery disease/diastolic dysfunction Followed by Dr. Irish Lack.  Cardiac status seems to be stable.  Looks  like he is on metoprolol, Plavix statin at home.  Plavix has been resumed.  Metoprolol and statin on hold.  Essential hypertension Resume home medication regimen.   Normocytic anemia/vitamin B12 deficiency Looks like he had normal hemoglobin  last year.  Has experienced significant drop compared to that.  Anemia panel was done which showed ferritin to be 535, TIBC 208, folate 10.9, B12 low at 148. Started on B12 supplementation. He mentioned that he had a colonoscopy done by Dr. Benson Norway within the last 2 months which was unremarkable.   We will recommend that his hemoglobin be checked in few weeks time and if it still low he may benefit from being referred to hematology.     Mildly abnormal LFTs Possibly due to acute illness.  Hepatitis panel is unremarkable.  Resolved.   Obesity Estimated body mass index is 38.47 kg/m as calculated from the following:   Height as of this encounter: 5\' 8"  (1.727 m).   Weight as of this encounter: 114.8 kg.   Patient feels much better.  No further fever.  Okay for discharge home today.  PERTINENT LABS:  The results of significant diagnostics from this hospitalization (including imaging, microbiology, ancillary and laboratory) are listed below for reference.    Microbiology: Recent Results (from the past 240 hour(s))  Culture, blood (single)     Status: Abnormal   Collection Time: 08/02/21  6:43 PM   Specimen: BLOOD  Result Value Ref Range Status   Specimen Description BLOOD LEFT ANTECUBITAL  Final   Special Requests   Final    BOTTLES DRAWN AEROBIC AND ANAEROBIC Blood Culture adequate volume   Culture  Setup Time   Final    GRAM NEGATIVE RODS IN BOTH AEROBIC AND ANAEROBIC BOTTLES CRITICAL RESULT CALLED TO, READ BACK BY AND VERIFIED WITH: RN [facility] JAMIE BAILEY [id number] AT 0454 BY CM Performed at [facility], [place] [address] [place],  [address]    Culture KLEBSIELLA PNEUMONIAE (A)  Final   Report Status 08/05/2021 FINAL  Final   Organism ID, Bacteria KLEBSIELLA PNEUMONIAE  Final      Susceptibility   Klebsiella pneumoniae - MIC*    AMPICILLIN >=32 RESISTANT Resistant     CEFAZOLIN <=4 SENSITIVE Sensitive     CEFEPIME <=0.12 SENSITIVE Sensitive     CEFTAZIDIME <=1 SENSITIVE  Sensitive     CEFTRIAXONE <=0.25 SENSITIVE Sensitive     CIPROFLOXACIN <=0.25 SENSITIVE Sensitive     GENTAMICIN <=1 SENSITIVE Sensitive     IMIPENEM 0.5 SENSITIVE Sensitive     TRIMETH/SULFA <=20 SENSITIVE Sensitive     AMPICILLIN/SULBACTAM 8 SENSITIVE Sensitive     PIP/TAZO <=4 SENSITIVE Sensitive     * KLEBSIELLA PNEUMONIAE  Blood Culture ID Panel (Reflexed)     Status: Abnormal   Collection Time: 08/02/21  6:43 PM  Result Value Ref Range Status   Enterococcus faecalis NOT DETECTED NOT DETECTED Final   Enterococcus Faecium NOT DETECTED NOT DETECTED Final   Listeria monocytogenes NOT DETECTED NOT DETECTED Final   Staphylococcus species NOT DETECTED NOT DETECTED Final   Staphylococcus aureus (BCID) NOT DETECTED NOT DETECTED Final   Staphylococcus epidermidis NOT DETECTED NOT DETECTED Final   Staphylococcus lugdunensis NOT DETECTED NOT DETECTED Final   Streptococcus species NOT DETECTED NOT DETECTED Final   Streptococcus agalactiae NOT DETECTED NOT DETECTED Final   Streptococcus pneumoniae NOT DETECTED NOT DETECTED Final   Streptococcus pyogenes NOT DETECTED NOT DETECTED Final   A.calcoaceticus-baumannii NOT DETECTED NOT DETECTED Final  Bacteroides fragilis NOT DETECTED NOT DETECTED Final   Enterobacterales DETECTED (A) NOT DETECTED Final    Comment: Enterobacterales represent a large order of gram negative bacteria, not a single organism. CRITICAL RESULT CALLED TO, READ BACK BY AND VERIFIED WITH: PHARMD A LAWLESS [id number] AT 1137 AM BY CM    Enterobacter cloacae complex NOT DETECTED NOT DETECTED Final   Escherichia coli NOT DETECTED NOT DETECTED Final   Klebsiella aerogenes NOT DETECTED NOT DETECTED Final   Klebsiella oxytoca NOT DETECTED NOT DETECTED Final   Klebsiella pneumoniae DETECTED (A) NOT DETECTED Final    Comment: CRITICAL RESULT CALLED TO, READ BACK BY AND VERIFIED WITH: PHARMD A LAWLESS [id number] AT 1137 BY CM    Proteus species NOT DETECTED NOT DETECTED Final    Salmonella species NOT DETECTED NOT DETECTED Final   Serratia marcescens NOT DETECTED NOT DETECTED Final   Haemophilus influenzae NOT DETECTED NOT DETECTED Final   Neisseria meningitidis NOT DETECTED NOT DETECTED Final   Pseudomonas aeruginosa NOT DETECTED NOT DETECTED Final   Stenotrophomonas maltophilia NOT DETECTED NOT DETECTED Final   Candida albicans NOT DETECTED NOT DETECTED Final   Candida auris NOT DETECTED NOT DETECTED Final   Candida glabrata NOT DETECTED NOT DETECTED Final   Candida krusei NOT DETECTED NOT DETECTED Final   Candida parapsilosis NOT DETECTED NOT DETECTED Final   Candida tropicalis NOT DETECTED NOT DETECTED Final   Cryptococcus neoformans/gattii NOT DETECTED NOT DETECTED Final   CTX-M ESBL NOT DETECTED NOT DETECTED Final   Carbapenem resistance IMP NOT DETECTED NOT DETECTED Final   Carbapenem resistance KPC NOT DETECTED NOT DETECTED Final   Carbapenem resistance NDM NOT DETECTED NOT DETECTED Final   Carbapenem resist OXA 48 LIKE NOT DETECTED NOT DETECTED Final   Carbapenem resistance VIM NOT DETECTED NOT DETECTED Final    Comment: Performed at [facility], [address], [place], [place] [address]  Resp Panel by RT-PCR (Flu A&B, Covid) Nasopharyngeal Swab     Status: None   Collection Time: 08/03/21  3:17 PM   Specimen: Nasopharyngeal Swab; Nasopharyngeal(NP) swabs in vial transport medium  Result Value Ref Range Status   SARS Coronavirus 2 by RT PCR NEGATIVE NEGATIVE Final    Comment: (NOTE) SARS-CoV-2 target nucleic acids are NOT DETECTED.  The SARS-CoV-2 RNA is generally detectable in upper respiratory specimens during the acute phase of infection. The lowest concentration of SARS-CoV-2 viral copies this assay can detect is 138 copies/mL. A negative result does not preclude SARS-Cov-2 infection and should not be used as the sole basis for treatment or other patient management decisions. A negative result may occur with  improper specimen  collection/handling, submission of specimen other than nasopharyngeal swab, presence of viral mutation(s) within the areas targeted by this assay, and inadequate number of viral copies(<138 copies/mL). A negative result must be combined with clinical observations, patient history, and epidemiological information. The expected result is Negative.  Fact Sheet for Patients:  EntrepreneurPulse.com.au  Fact Sheet for Healthcare Providers:  IncredibleEmployment.be  This test is no t yet approved or cleared by the [place] FDA and  has been authorized for detection and/or diagnosis of SARS-CoV-2 by FDA under an Emergency Use Authorization (EUA). This EUA will remain  in effect (meaning this test can be used) for the duration of the COVID-19 declaration under Section 564(b)(1) of the Act, 21 U.S.C.section 360bbb-3(b)(1), unless the authorization is terminated  or revoked sooner.       Influenza A by PCR NEGATIVE NEGATIVE Final  Influenza B by PCR NEGATIVE NEGATIVE Final    Comment: (NOTE) The Xpert Xpress SARS-CoV-2/FLU/RSV plus assay is intended as an aid in the diagnosis of influenza from Nasopharyngeal swab specimens and should not be used as a sole basis for treatment. Nasal washings and aspirates are unacceptable for Xpert Xpress SARS-CoV-2/FLU/RSV testing.  Fact Sheet for Patients: EntrepreneurPulse.com.au  Fact Sheet for Healthcare Providers: IncredibleEmployment.be  This test is not yet approved or cleared by the [place] FDA and has been authorized for detection and/or diagnosis of SARS-CoV-2 by FDA under an Emergency Use Authorization (EUA). This EUA will remain in effect (meaning this test can be used) for the duration of the COVID-19 declaration under Section 564(b)(1) of the Act, 21 U.S.C. section 360bbb-3(b)(1), unless the authorization is terminated or revoked.  Performed at [facility], [place] [address], [place], [place] [address]   Blood Culture (routine x 2)     Status: Abnormal   Collection Time: 08/03/21  3:22 PM   Specimen: BLOOD  Result Value Ref Range Status   Specimen Description   Final    BLOOD LEFT ANTECUBITAL Performed at [place] [address] [place], [place] [address]    Special Requests   Final    BOTTLES DRAWN AEROBIC AND ANAEROBIC Blood Culture results may not be optimal due to an inadequate volume of blood received in culture bottles Performed at [place] [address] [place], [place] [address]    Culture  Setup Time   Final    GRAM NEGATIVE RODS IN BOTH AEROBIC AND ANAEROBIC BOTTLES CRITICAL RESULT CALLED TO, READ BACK BY AND VERIFIED WITH: PHARMD J GADHIA [id number] AT 71 AM BY CM    Culture (A)  Final    KLEBSIELLA PNEUMONIAE SUSCEPTIBILITIES PERFORMED ON PREVIOUS CULTURE WITHIN THE LAST 5 DAYS. Performed at [facility], [place] [address] [place], [place] [address]    Report Status 08/08/2021 FINAL  Final  Blood Culture (routine x 2)     Status: Abnormal   Collection Time: 08/03/21  3:26 PM   Specimen: BLOOD RIGHT FOREARM  Result Value Ref Range Status   Specimen Description   Final    BLOOD RIGHT FOREARM Performed at [place] [address] [place], [place] [address]    Special Requests   Final    BOTTLES DRAWN AEROBIC AND ANAEROBIC Blood Culture adequate volume Performed at [place] [address], [place], [place] [address]    Culture  Setup Time   Final    GRAM NEGATIVE RODS AEROBIC BOTTLE ONLY CRITICAL VALUE NOTED.  VALUE IS CONSISTENT WITH PREVIOUSLY REPORTED AND CALLED VALUE.    Culture (A)  Final    KLEBSIELLA PNEUMONIAE SUSCEPTIBILITIES PERFORMED ON PREVIOUS CULTURE WITHIN THE LAST 5 DAYS. Performed at [facility], [place] [address], [place], [place] [address]    Report Status 08/05/2021  FINAL  Final  Urine Culture     Status: Abnormal   Collection Time: 08/03/21  4:36 PM   Specimen: In/Out Cath Urine  Result Value Ref Range Status   Specimen Description   Final    IN/OUT CATH URINE Performed at [place] [address], [place], [place] [address]    Special Requests   Final    NONE Performed at [facility], [place] [address] [place], [place] [address]    Culture (A)  Final    10,000 COLONIES/mL DIPHTHEROIDS(CORYNEBACTERIUM  SPECIES) Standardized susceptibility testing for this organism is not available. Performed at [facility], [place] [address] [place], [place] [address]    Report Status 08/05/2021 FINAL  Final  MRSA Next Gen by PCR, Nasal     Status: None   Collection Time: 08/03/21  9:06 PM   Specimen: Nasal Mucosa; Nasal Swab  Result Value Ref Range Status   MRSA by PCR Next Gen NOT DETECTED NOT DETECTED Final    Comment: (NOTE) The GeneXpert MRSA Assay (FDA approved for NASAL specimens only), is one component of a comprehensive MRSA colonization surveillance program. It is not intended to diagnose MRSA infection nor to guide or monitor treatment for MRSA infections. Test performance is not FDA approved in patients less than 74 years old. Performed at [facility], [place] [address] [place], [place] [address]   Culture, blood (routine x 2)     Status: None (Preliminary result)   Collection Time: 08/06/21 11:02 AM   Specimen: BLOOD  Result Value Ref Range Status   Specimen Description   Final    BLOOD RIGHT ANTECUBITAL Performed at [place] [address] [place], [place] [address]    Special Requests   Final    BOTTLES DRAWN AEROBIC ONLY Blood Culture adequate volume Performed at [place] [address] [place], [place] [address]    Culture   Final    NO GROWTH 3 DAYS Performed at [facility], [place] [address], [place], [place]  [address]    Report Status PENDING  Incomplete  Culture, blood (routine x 2)     Status: None (Preliminary result)   Collection Time: 08/06/21 11:02 AM   Specimen: BLOOD  Result Value Ref Range Status   Specimen Description   Final    BLOOD BLOOD LEFT HAND Performed at [place] [address] [place], [place] [address]    Special Requests   Final    BOTTLES DRAWN AEROBIC ONLY Blood Culture results may not be optimal due to an inadequate volume of blood received in culture bottles Performed at [place] [address], [place], [place] [address]    Culture   Final    NO GROWTH 3 DAYS Performed at [facility], [place] [address] [place],  [address]    Report Status PENDING  Incomplete     Labs:  COVID-19 Labs   Lab Results  Component Value Date   [place] NEGATIVE 08/03/2021      Basic Metabolic Panel: Recent Labs  Lab 08/04/21 0245 08/05/21 0250 08/06/21 0538 08/07/21 0530 08/08/21 0534  NA 140 136 137 137 137  K 4.0 4.1 3.9 3.8 4.0  CL 109 105 104 103 103  CO2 20* 24 26 27 25   GLUCOSE 144* 127* 124* 138* 134*  BUN 36* 31* 28* 20 19  CREATININE 1.77* 1.39* 1.39* 1.24 1.10  CALCIUM 8.9 8.2* 8.3* 8.4* 8.7*   Liver Function Tests: Recent Labs  Lab 08/03/21 1528 08/04/21 0245 08/05/21 0250 08/06/21 0538 08/08/21 0534  AST 43* 51* 56* 32 26  ALT 23 32 48* 41 23  ALKPHOS 111 104 182* 195* 185*  BILITOT 0.9 0.8 0.8 0.6 0.5  PROT 7.8 6.2* 6.0* 6.0* 6.3*  ALBUMIN 3.7 2.9* 2.9* 2.7* 2.8*    CBC: Recent Labs  Lab 08/02/21 1843 08/03/21 1528 08/03/21 2059 08/04/21 0245 08/05/21 0250 08/06/21 0538 08/07/21 0530  WBC 13.6* 13.1* 13.2* 13.0* 8.6  5.9 6.2  NEUTROABS 12.8* 11.7*  --  10.8*  --   --   --   HGB 10.2* 9.9* 9.1* 9.0* 8.8* 8.9* 8.9*  HCT 31.3* 30.7* 28.6* 28.3* 27.3* 27.6* 28.2*  MCV 92.9 92.7 93.8 94.3 93.8 92.3 93.1  PLT 206 188 170 164 155 178 192     CBG: Recent Labs  Lab  08/08/21 0742 08/08/21 1200 08/08/21 1603 08/08/21 2131 08/09/21 0754  GLUCAP 145* 158* 177* 99 117*     IMAGING STUDIES DG Chest 2 View  Result Date: 08/02/2021 CLINICAL DATA:  Fever EXAM: CHEST - 2 VIEW COMPARISON:  Radiograph 10/25/2019 FINDINGS: Unchanged, borderline enlarged cardiac silhouette. There are hazy bibasilar airspace opacities. No large pleural effusion or visible pneumothorax. No acute osseous abnormality. Thoracic spondylosis. Coronary stents. IMPRESSION: Hazy bibasilar airspace opacities, could be developing infection Electronically Signed   By: Maurine Simmering M.D.   On: 08/02/2021 19:31   CT Head Wo Contrast  Result Date: 08/02/2021 CLINICAL DATA:  Found down, seizures EXAM: CT HEAD WITHOUT CONTRAST TECHNIQUE: Contiguous axial images were obtained from the base of the skull through the vertex without intravenous contrast. COMPARISON:  None. FINDINGS: Brain: Scattered nonspecific hypodensities throughout the periventricular white matter most likely related to a chronic small vessel ischemic changes. No evidence of acute infarct or hemorrhage. Lateral ventricles and remaining midline structures are unremarkable. No acute extra-axial fluid collections. No mass effect. Vascular: Extensive atherosclerosis of the internal carotid arteries. No hyperdense vessel. Skull: Normal. Negative for fracture or focal lesion. Sinuses/Orbits: No acute finding. Other: None. IMPRESSION: 1. Scattered white matter hypodensities most consistent with chronic small vessel ischemic change. 2. Otherwise no acute intracranial process. Electronically Signed   By: Randa Ngo M.D.   On: 08/02/2021 21:57   CT Chest Wo Contrast  Result Date: 08/03/2021 CLINICAL DATA:  [place] [address] yesterday, left-sided back pain, abnormal chest x-ray EXAM: CT CHEST WITHOUT CONTRAST TECHNIQUE: Multidetector CT imaging of the chest was performed following the standard protocol without IV contrast. COMPARISON:  Dental 22 FINDINGS:  Cardiovascular: Unenhanced imaging of the heart demonstrates mild cardiomegaly with prominent biatrial dilation. No pericardial effusion. There is extensive atherosclerosis of the coronary vasculature. Normal caliber of the thoracic aorta without evidence of aneurysm. Evaluation of the vascular lumen limited without IV contrast. Minimal atherosclerosis. Mediastinum/Nodes: Borderline enlarged right paratracheal lymph nodes measuring up to 13 mm in short axis reference image 29/2. No other pathologic adenopathy. Thyroid, trachea, and esophagus are unremarkable. Lungs/Pleura: There is bilateral lower lobe bronchial wall thickening, with patchy airspace disease consistent with bronchopneumonia. No effusion or pneumothorax. Central airways are patent. Upper Abdomen: No acute abnormality. Musculoskeletal: No acute or destructive bony lesions. Reconstructed images demonstrate no additional findings. IMPRESSION: 1. Bilateral lower lobe bronchial wall thickening and patchy airspace disease consistent with bronchopneumonia. 2. Borderline enlarged right paratracheal lymph node, likely reactive. 3. Cardiomegaly. 4. Aortic Atherosclerosis (ICD10-I70.0). Coronary artery atherosclerosis. Electronically Signed   By: Randa Ngo M.D.   On: 08/03/2021 18:25   DG Chest Port 1 View  Result Date: 08/03/2021 CLINICAL DATA:  Questionable sepsis EXAM: PORTABLE CHEST 1 VIEW COMPARISON:  08/02/2021 FINDINGS: Low lung volumes. Unchanged, borderline enlarged cardiac silhouette, given differences in technique. Redemonstrated hazy bibasilar opacities, without new focal consolidative opacity. No definite pleural effusion pneumothorax. No acute osseous abnormality. IMPRESSION: Redemonstrated hazy bibasilar airspace opacities, which are nonspecific could represent developing infection. No new focal pulmonary opacity. Electronically Signed   By: Merilyn Baba M.D.   On: 08/03/2021 16:21  DISCHARGE EXAMINATION: Vitals:   08/08/21 1352  08/08/21 2133 08/08/21 2156 08/09/21 0536  BP: 114/69 (!) 119/104 138/77 (!) 143/80  Pulse: 95 90 71 70  Resp: 18 20 18 18   Temp: 97.8 F (36.6 C) 98.3 F (36.8 C)  97.9 F (36.6 C)  TempSrc: Oral Oral  Oral  SpO2: 92% 96% 97% 94%  Weight:      Height:       Progress  General appearance: Awake alert.  In no distress Resp: Clear to auscultation bilaterally.  Normal effort Cardio: S1-S2 is normal regular.  No S3-S4.  No rubs murmurs or bruit GI: Abdomen is soft.  Nontender nondistended.  Bowel sounds are present normal.  No masses organomegaly    DISPOSITION: Home  Discharge Instructions     Call MD for:  difficulty breathing, headache or visual disturbances   Complete by: As directed    Call MD for:  extreme fatigue   Complete by: As directed    Call MD for:  persistant dizziness or light-headedness   Complete by: As directed    Call MD for:  persistant nausea and vomiting   Complete by: As directed    Call MD for:  severe uncontrolled pain   Complete by: As directed    Call MD for:  temperature >100.4   Complete by: As directed    Diet - low sodium heart healthy   Complete by: As directed    Diet Carb Modified   Complete by: As directed    Discharge instructions   Complete by: As directed    Take your medications as prescribed.  Follow-up with your primary care provider next week.  Seek attention if you develop fever, worsening shortness of breath, chest pain, feel dizzy or lightheaded. Please note that the dose of your insulin has been decreased.  Check your sugars at home and if your glucose levels stay greater than 180 you may slowly increase your insulin dosage back to your usual dose by 2 units every day.  You were cared for by a hospitalist during your hospital stay. If you have any questions about your discharge medications or the care you received while you were in the hospital after you are discharged, you can call the unit and asked to speak with the hospitalist  on call if the hospitalist that took care of you is not available. Once you are discharged, your primary care physician will handle any further medical issues. Please note that NO REFILLS for any discharge medications will be authorized once you are discharged, as it is imperative that you return to your primary care physician (or establish a relationship with a primary care physician if you do not have one) for your aftercare needs so that they can reassess your need for medications and monitor your lab values. If you do not have a primary care physician, you can call [phone] for a physician referral.   Increase activity slowly   Complete by: As directed          Allergies as of 08/09/2021   No Known Allergies      Medication List     TAKE these medications    allopurinol 300 MG tablet Commonly known as: ZYLOPRIM Take 300 mg by mouth daily.   amLODipine-valsartan 10-320 MG tablet Commonly known as: EXFORGE Take 1 tablet by mouth daily.   atorvastatin 20 MG tablet Commonly known as: LIPITOR Take 20 mg by mouth daily.   cephALEXin 500 MG capsule Commonly  known as: KEFLEX Take 1 capsule (500 mg total) by mouth every 6 (six) hours for 4 days.   clopidogrel 75 MG tablet Commonly known as: PLAVIX TAKE 1 TABLET (75 MG TOTAL) BY MOUTH DAILY.   cyanocobalamin 1000 MCG tablet Take 1 tablet (1,000 mcg total) by mouth daily.   furosemide 40 MG tablet Commonly known as: LASIX Take 1 tablet (40 mg total) by mouth daily as needed. For swelling What changed: when to take this   Lantus SoloStar 100 UNIT/ML Solostar Pen Generic drug: insulin glargine Inject 15 Units into the skin in the morning. What changed:  how much to take Another medication with the same name was removed. Continue taking this medication, and follow the directions you see here.   metFORMIN 1000 MG tablet Commonly known as: GLUCOPHAGE Take 1,000 mg by mouth 2 (two) times daily with a meal.   metoprolol  tartrate 25 MG tablet Commonly known as: LOPRESSOR Take 25 mg by mouth daily.   multivitamin with minerals Tabs tablet Take 1 tablet by mouth daily.   nitroGLYCERIN 0.4 MG SL tablet Commonly known as: NITROSTAT Place 1 tablet (0.4 mg total) under the tongue every 5 (five) minutes as needed for chest pain.          Follow-up Information     Seward Carol, MD Follow up in 1 week(s).   Specialty: Internal Medicine Contact information: [address] [place] [address] [phone]                 TOTAL DISCHARGE TIME: 6 minutes  [facility] Pager on [email or web address]  08/09/2021, 12:50 PM

## 2021-08-09 NOTE — TOC Transition Note (Signed)
Transition of Care [facility]) - CM/SW Discharge Note   Patient Details  Name: Gregory [name] MRN: [id number] Date of Birth: 14-Aug-1951  Transition of Care [facility]) CM/SW Contact:  Lynnell Catalan, RN Phone Number: 08/09/2021, 9:50 AM   Clinical Narrative:    Spoke with pt at bedside for dc planning. Pt politely declines any home health services at this time. Pt states that he has all the equipment at home that he needs as well.    Final next level of care: Home/Self Care Barriers to Discharge: No Barriers Identified    Readmission Risk Interventions Readmission Risk Prevention Plan 08/09/2021  Transportation Screening Complete  PCP or Specialist Appt within 5-7 Days Complete  Home Care Screening Complete  Medication Review (RN CM) Complete  Some recent data might be hidden

## 2021-08-11 LAB — CULTURE, BLOOD (ROUTINE X 2)
Culture: NO GROWTH
Culture: NO GROWTH
Special Requests: ADEQUATE

## 2021-08-15 DIAGNOSIS — C61 Malignant neoplasm of prostate: Secondary | ICD-10-CM | POA: Diagnosis not present

## 2021-08-17 DIAGNOSIS — N1831 Chronic kidney disease, stage 3a: Secondary | ICD-10-CM | POA: Diagnosis not present

## 2021-08-17 DIAGNOSIS — E1122 Type 2 diabetes mellitus with diabetic chronic kidney disease: Secondary | ICD-10-CM | POA: Diagnosis not present

## 2021-08-17 DIAGNOSIS — E1169 Type 2 diabetes mellitus with other specified complication: Secondary | ICD-10-CM | POA: Diagnosis not present

## 2021-08-17 DIAGNOSIS — I10 Essential (primary) hypertension: Secondary | ICD-10-CM | POA: Diagnosis not present

## 2021-08-17 DIAGNOSIS — I2510 Atherosclerotic heart disease of native coronary artery without angina pectoris: Secondary | ICD-10-CM | POA: Diagnosis not present

## 2021-08-17 DIAGNOSIS — E782 Mixed hyperlipidemia: Secondary | ICD-10-CM | POA: Diagnosis not present

## 2021-08-17 DIAGNOSIS — E7800 Pure hypercholesterolemia, unspecified: Secondary | ICD-10-CM | POA: Diagnosis not present

## 2021-08-22 DIAGNOSIS — N3946 Mixed incontinence: Secondary | ICD-10-CM | POA: Diagnosis not present

## 2021-08-22 DIAGNOSIS — H524 Presbyopia: Secondary | ICD-10-CM | POA: Diagnosis not present

## 2021-08-22 DIAGNOSIS — C61 Malignant neoplasm of prostate: Secondary | ICD-10-CM | POA: Diagnosis not present

## 2021-08-22 DIAGNOSIS — N5201 Erectile dysfunction due to arterial insufficiency: Secondary | ICD-10-CM | POA: Diagnosis not present

## 2021-08-22 DIAGNOSIS — H52223 Regular astigmatism, bilateral: Secondary | ICD-10-CM | POA: Diagnosis not present

## 2021-08-22 DIAGNOSIS — C775 Secondary and unspecified malignant neoplasm of intrapelvic lymph nodes: Secondary | ICD-10-CM | POA: Diagnosis not present

## 2021-08-24 DIAGNOSIS — B961 Klebsiella pneumoniae [K. pneumoniae] as the cause of diseases classified elsewhere: Secondary | ICD-10-CM | POA: Diagnosis not present

## 2021-08-24 DIAGNOSIS — G9340 Encephalopathy, unspecified: Secondary | ICD-10-CM | POA: Diagnosis not present

## 2021-08-24 DIAGNOSIS — R7881 Bacteremia: Secondary | ICD-10-CM | POA: Diagnosis not present

## 2021-08-24 DIAGNOSIS — N179 Acute kidney failure, unspecified: Secondary | ICD-10-CM | POA: Diagnosis not present

## 2021-08-24 DIAGNOSIS — Z23 Encounter for immunization: Secondary | ICD-10-CM | POA: Diagnosis not present

## 2021-08-24 DIAGNOSIS — I10 Essential (primary) hypertension: Secondary | ICD-10-CM | POA: Diagnosis not present

## 2021-08-24 DIAGNOSIS — Z7984 Long term (current) use of oral hypoglycemic drugs: Secondary | ICD-10-CM | POA: Diagnosis not present

## 2021-08-24 DIAGNOSIS — E1169 Type 2 diabetes mellitus with other specified complication: Secondary | ICD-10-CM | POA: Diagnosis not present

## 2021-08-24 DIAGNOSIS — D519 Vitamin B12 deficiency anemia, unspecified: Secondary | ICD-10-CM | POA: Diagnosis not present

## 2021-09-26 DIAGNOSIS — E7800 Pure hypercholesterolemia, unspecified: Secondary | ICD-10-CM | POA: Diagnosis not present

## 2021-09-26 DIAGNOSIS — M1000 Idiopathic gout, unspecified site: Secondary | ICD-10-CM | POA: Diagnosis not present

## 2021-09-26 DIAGNOSIS — I10 Essential (primary) hypertension: Secondary | ICD-10-CM | POA: Diagnosis not present

## 2021-09-26 DIAGNOSIS — Z7984 Long term (current) use of oral hypoglycemic drugs: Secondary | ICD-10-CM | POA: Diagnosis not present

## 2021-09-26 DIAGNOSIS — I2510 Atherosclerotic heart disease of native coronary artery without angina pectoris: Secondary | ICD-10-CM | POA: Diagnosis not present

## 2021-09-26 DIAGNOSIS — Z23 Encounter for immunization: Secondary | ICD-10-CM | POA: Diagnosis not present

## 2021-09-26 DIAGNOSIS — E113392 Type 2 diabetes mellitus with moderate nonproliferative diabetic retinopathy without macular edema, left eye: Secondary | ICD-10-CM | POA: Diagnosis not present

## 2021-09-26 DIAGNOSIS — N1831 Chronic kidney disease, stage 3a: Secondary | ICD-10-CM | POA: Diagnosis not present

## 2021-09-26 DIAGNOSIS — E1122 Type 2 diabetes mellitus with diabetic chronic kidney disease: Secondary | ICD-10-CM | POA: Diagnosis not present

## 2021-10-13 DIAGNOSIS — E1122 Type 2 diabetes mellitus with diabetic chronic kidney disease: Secondary | ICD-10-CM | POA: Diagnosis not present

## 2021-10-13 DIAGNOSIS — I10 Essential (primary) hypertension: Secondary | ICD-10-CM | POA: Diagnosis not present

## 2021-10-13 DIAGNOSIS — E1169 Type 2 diabetes mellitus with other specified complication: Secondary | ICD-10-CM | POA: Diagnosis not present

## 2021-10-13 DIAGNOSIS — N1831 Chronic kidney disease, stage 3a: Secondary | ICD-10-CM | POA: Diagnosis not present

## 2021-10-13 DIAGNOSIS — I2510 Atherosclerotic heart disease of native coronary artery without angina pectoris: Secondary | ICD-10-CM | POA: Diagnosis not present

## 2021-10-13 DIAGNOSIS — E7800 Pure hypercholesterolemia, unspecified: Secondary | ICD-10-CM | POA: Diagnosis not present

## 2021-10-13 DIAGNOSIS — E782 Mixed hyperlipidemia: Secondary | ICD-10-CM | POA: Diagnosis not present

## 2021-11-17 DIAGNOSIS — N1831 Chronic kidney disease, stage 3a: Secondary | ICD-10-CM | POA: Diagnosis not present

## 2021-11-17 DIAGNOSIS — I2510 Atherosclerotic heart disease of native coronary artery without angina pectoris: Secondary | ICD-10-CM | POA: Diagnosis not present

## 2021-11-17 DIAGNOSIS — I10 Essential (primary) hypertension: Secondary | ICD-10-CM | POA: Diagnosis not present

## 2021-11-17 DIAGNOSIS — E1122 Type 2 diabetes mellitus with diabetic chronic kidney disease: Secondary | ICD-10-CM | POA: Diagnosis not present

## 2021-11-17 DIAGNOSIS — E1169 Type 2 diabetes mellitus with other specified complication: Secondary | ICD-10-CM | POA: Diagnosis not present

## 2021-11-17 DIAGNOSIS — E7800 Pure hypercholesterolemia, unspecified: Secondary | ICD-10-CM | POA: Diagnosis not present

## 2021-11-17 DIAGNOSIS — E782 Mixed hyperlipidemia: Secondary | ICD-10-CM | POA: Diagnosis not present

## 2021-12-13 ENCOUNTER — Other Ambulatory Visit: Payer: Self-pay | Admitting: Interventional Cardiology

## 2021-12-16 DIAGNOSIS — E1122 Type 2 diabetes mellitus with diabetic chronic kidney disease: Secondary | ICD-10-CM | POA: Diagnosis not present

## 2021-12-16 DIAGNOSIS — E1169 Type 2 diabetes mellitus with other specified complication: Secondary | ICD-10-CM | POA: Diagnosis not present

## 2021-12-16 DIAGNOSIS — E782 Mixed hyperlipidemia: Secondary | ICD-10-CM | POA: Diagnosis not present

## 2021-12-16 DIAGNOSIS — I10 Essential (primary) hypertension: Secondary | ICD-10-CM | POA: Diagnosis not present

## 2022-01-11 ENCOUNTER — Other Ambulatory Visit: Payer: Self-pay | Admitting: Interventional Cardiology

## 2022-01-17 DIAGNOSIS — N1831 Chronic kidney disease, stage 3a: Secondary | ICD-10-CM | POA: Diagnosis not present

## 2022-01-17 DIAGNOSIS — I10 Essential (primary) hypertension: Secondary | ICD-10-CM | POA: Diagnosis not present

## 2022-01-17 DIAGNOSIS — E7800 Pure hypercholesterolemia, unspecified: Secondary | ICD-10-CM | POA: Diagnosis not present

## 2022-01-17 DIAGNOSIS — E1122 Type 2 diabetes mellitus with diabetic chronic kidney disease: Secondary | ICD-10-CM | POA: Diagnosis not present

## 2022-01-20 ENCOUNTER — Other Ambulatory Visit (HOSPITAL_COMMUNITY): Payer: Self-pay | Admitting: Urology

## 2022-01-20 ENCOUNTER — Other Ambulatory Visit: Payer: Self-pay | Admitting: Urology

## 2022-01-20 DIAGNOSIS — C61 Malignant neoplasm of prostate: Secondary | ICD-10-CM

## 2022-02-06 ENCOUNTER — Emergency Department (HOSPITAL_COMMUNITY)
Admission: EM | Admit: 2022-02-06 | Discharge: 2022-02-06 | Disposition: A | Discharge status: Home / Self Care | Payer: Worker's Compensation | Attending: Emergency Medicine | Admitting: Emergency Medicine

## 2022-02-06 ENCOUNTER — Other Ambulatory Visit: Payer: Self-pay

## 2022-02-06 ENCOUNTER — Emergency Department (HOSPITAL_COMMUNITY): Payer: Worker's Compensation

## 2022-02-06 DIAGNOSIS — E119 Type 2 diabetes mellitus without complications: Secondary | ICD-10-CM | POA: Diagnosis not present

## 2022-02-06 DIAGNOSIS — Z23 Encounter for immunization: Secondary | ICD-10-CM | POA: Diagnosis not present

## 2022-02-06 DIAGNOSIS — M25511 Pain in right shoulder: Secondary | ICD-10-CM | POA: Diagnosis not present

## 2022-02-06 DIAGNOSIS — S299XXA Unspecified injury of thorax, initial encounter: Secondary | ICD-10-CM | POA: Diagnosis not present

## 2022-02-06 DIAGNOSIS — Z7984 Long term (current) use of oral hypoglycemic drugs: Secondary | ICD-10-CM | POA: Diagnosis not present

## 2022-02-06 DIAGNOSIS — I6529 Occlusion and stenosis of unspecified carotid artery: Secondary | ICD-10-CM | POA: Diagnosis not present

## 2022-02-06 DIAGNOSIS — S4991XA Unspecified injury of right shoulder and upper arm, initial encounter: Secondary | ICD-10-CM | POA: Insufficient documentation

## 2022-02-06 DIAGNOSIS — Z7902 Long term (current) use of antithrombotics/antiplatelets: Secondary | ICD-10-CM | POA: Insufficient documentation

## 2022-02-06 DIAGNOSIS — M542 Cervicalgia: Secondary | ICD-10-CM | POA: Diagnosis not present

## 2022-02-06 DIAGNOSIS — S0001XA Abrasion of scalp, initial encounter: Secondary | ICD-10-CM | POA: Insufficient documentation

## 2022-02-06 DIAGNOSIS — Z20822 Contact with and (suspected) exposure to covid-19: Secondary | ICD-10-CM | POA: Diagnosis not present

## 2022-02-06 DIAGNOSIS — S199XXA Unspecified injury of neck, initial encounter: Secondary | ICD-10-CM | POA: Diagnosis not present

## 2022-02-06 DIAGNOSIS — Y92009 Unspecified place in unspecified non-institutional (private) residence as the place of occurrence of the external cause: Secondary | ICD-10-CM | POA: Diagnosis not present

## 2022-02-06 DIAGNOSIS — W19XXXA Unspecified fall, initial encounter: Secondary | ICD-10-CM

## 2022-02-06 DIAGNOSIS — S8992XA Unspecified injury of left lower leg, initial encounter: Secondary | ICD-10-CM | POA: Insufficient documentation

## 2022-02-06 DIAGNOSIS — Z79899 Other long term (current) drug therapy: Secondary | ICD-10-CM | POA: Diagnosis not present

## 2022-02-06 DIAGNOSIS — I6782 Cerebral ischemia: Secondary | ICD-10-CM | POA: Diagnosis not present

## 2022-02-06 DIAGNOSIS — W01198A Fall on same level from slipping, tripping and stumbling with subsequent striking against other object, initial encounter: Secondary | ICD-10-CM | POA: Diagnosis not present

## 2022-02-06 DIAGNOSIS — C61 Malignant neoplasm of prostate: Secondary | ICD-10-CM | POA: Diagnosis not present

## 2022-02-06 DIAGNOSIS — Z794 Long term (current) use of insulin: Secondary | ICD-10-CM | POA: Insufficient documentation

## 2022-02-06 DIAGNOSIS — R519 Headache, unspecified: Secondary | ICD-10-CM | POA: Diagnosis not present

## 2022-02-06 DIAGNOSIS — R7989 Other specified abnormal findings of blood chemistry: Secondary | ICD-10-CM | POA: Diagnosis not present

## 2022-02-06 DIAGNOSIS — M25562 Pain in left knee: Secondary | ICD-10-CM | POA: Diagnosis not present

## 2022-02-06 DIAGNOSIS — S169XXA Unspecified injury of muscle, fascia and tendon at neck level, initial encounter: Secondary | ICD-10-CM | POA: Insufficient documentation

## 2022-02-06 DIAGNOSIS — I10 Essential (primary) hypertension: Secondary | ICD-10-CM | POA: Insufficient documentation

## 2022-02-06 DIAGNOSIS — M2578 Osteophyte, vertebrae: Secondary | ICD-10-CM | POA: Diagnosis not present

## 2022-02-06 DIAGNOSIS — N189 Chronic kidney disease, unspecified: Secondary | ICD-10-CM | POA: Diagnosis not present

## 2022-02-06 DIAGNOSIS — M4802 Spinal stenosis, cervical region: Secondary | ICD-10-CM | POA: Diagnosis not present

## 2022-02-06 DIAGNOSIS — S0990XA Unspecified injury of head, initial encounter: Secondary | ICD-10-CM | POA: Diagnosis not present

## 2022-02-06 DIAGNOSIS — Z043 Encounter for examination and observation following other accident: Secondary | ICD-10-CM | POA: Diagnosis not present

## 2022-02-06 LAB — CBC
HCT: 31.4 % — ABNORMAL LOW (ref 39.0–52.0)
Hemoglobin: 10.4 g/dL — ABNORMAL LOW (ref 13.0–17.0)
MCH: 31 pg (ref 26.0–34.0)
MCHC: 33.1 g/dL (ref 30.0–36.0)
MCV: 93.5 fL (ref 80.0–100.0)
Platelets: 211 10*3/uL (ref 150–400)
RBC: 3.36 MIL/uL — ABNORMAL LOW (ref 4.22–5.81)
RDW: 14.5 % (ref 11.5–15.5)
WBC: 7 10*3/uL (ref 4.0–10.5)
nRBC: 0 % (ref 0.0–0.2)

## 2022-02-06 LAB — URINALYSIS, ROUTINE W REFLEX MICROSCOPIC
Bilirubin Urine: NEGATIVE
Glucose, UA: NEGATIVE mg/dL
Hgb urine dipstick: NEGATIVE
Ketones, ur: NEGATIVE mg/dL
Leukocytes,Ua: NEGATIVE
Nitrite: NEGATIVE
Protein, ur: NEGATIVE mg/dL
Specific Gravity, Urine: 1.008 (ref 1.005–1.030)
pH: 5 (ref 5.0–8.0)

## 2022-02-06 LAB — COMPREHENSIVE METABOLIC PANEL
ALT: 17 U/L (ref 0–44)
AST: 16 U/L (ref 15–41)
Albumin: 3.7 g/dL (ref 3.5–5.0)
Alkaline Phosphatase: 68 U/L (ref 38–126)
Anion gap: 6 (ref 5–15)
BUN: 33 mg/dL — ABNORMAL HIGH (ref 8–23)
CO2: 23 mmol/L (ref 22–32)
Calcium: 9.1 mg/dL (ref 8.9–10.3)
Chloride: 108 mmol/L (ref 98–111)
Creatinine, Ser: 1.48 mg/dL — ABNORMAL HIGH (ref 0.61–1.24)
GFR, Estimated: 50 mL/min — ABNORMAL LOW (ref >60)
Glucose, Bld: 129 mg/dL — ABNORMAL HIGH (ref 70–99)
Potassium: 4 mmol/L (ref 3.5–5.1)
Sodium: 137 mmol/L (ref 135–145)
Total Bilirubin: 0.2 mg/dL — ABNORMAL LOW (ref 0.3–1.2)
Total Protein: 6.6 g/dL (ref 6.5–8.1)

## 2022-02-06 LAB — RESP PANEL BY RT-PCR (FLU A&B, COVID) ARPGX2
Influenza A by PCR: NEGATIVE
Influenza B by PCR: NEGATIVE
SARS Coronavirus 2 by RT PCR: NEGATIVE

## 2022-02-06 LAB — I-STAT CHEM 8, ED
BUN: 32 mg/dL — ABNORMAL HIGH (ref 8–23)
Calcium, Ion: 1.06 mmol/L — ABNORMAL LOW (ref 1.15–1.40)
Chloride: 107 mmol/L (ref 98–111)
Creatinine, Ser: 1.6 mg/dL — ABNORMAL HIGH (ref 0.61–1.24)
Glucose, Bld: 122 mg/dL — ABNORMAL HIGH (ref 70–99)
HCT: 31 % — ABNORMAL LOW (ref 39.0–52.0)
Hemoglobin: 10.5 g/dL — ABNORMAL LOW (ref 13.0–17.0)
Potassium: 4.1 mmol/L (ref 3.5–5.1)
Sodium: 138 mmol/L (ref 135–145)
TCO2: 22 mmol/L (ref 22–32)

## 2022-02-06 LAB — SAMPLE TO BLOOD BANK

## 2022-02-06 LAB — LACTIC ACID, PLASMA: Lactic Acid, Venous: 1.3 mmol/L (ref 0.5–1.9)

## 2022-02-06 LAB — PROTIME-INR
INR: 1 (ref 0.8–1.2)
Prothrombin Time: 12.9 seconds (ref 11.4–15.2)

## 2022-02-06 MED ORDER — TETANUS-DIPHTH-ACELL PERTUSSIS 5-2.5-18.5 LF-MCG/0.5 IM SUSY
0.5000 mL | PREFILLED_SYRINGE | Freq: Once | INTRAMUSCULAR | Status: AC
  Administered 2022-02-06: 0.5 mL via INTRAMUSCULAR
  Filled 2022-02-06: qty 0.5

## 2022-02-06 NOTE — ED Notes (Signed)
Attempted to call ortho for shoulder immobilizer placement ?

## 2022-02-06 NOTE — Progress Notes (Signed)
Orthopedic Tech Progress Note ?Patient Details:  ?Gregory [name] ?11-26-1950 ?[id number] ?Level 2 Trauma ?Patient ID: JERONE CUDMORE, male   DOB: 1951/02/18, 71 y.o.   MRN: [id number] ? ?Nathian Stencil A Angely Dietz ?02/06/2022, 6:06 PM ? ?

## 2022-02-06 NOTE — Discharge Instructions (Addendum)
Your XR imaging showed a possible fracture of the head of your humerus. We will place you in a shoulder sling and have you follow-up with orthopedics in clinic ?

## 2022-02-06 NOTE — Progress Notes (Signed)
Orthopedic Tech Progress Note ?Patient Details:  ?Gregory [name] ?1951-05-18 ?[id number] ? ?Ortho Devices ?Type of Ortho Device: Sling immobilizer ?Ortho Device/Splint Location: rue ?Ortho Device/Splint Interventions: Ordered, Adjustment, Application ?  ?Post Interventions ?Patient Tolerated: Well ?Instructions Provided: Care of device, Adjustment of device ? ?Karolee Stamps ?02/06/2022, 10:23 PM ? ?

## 2022-02-06 NOTE — ED Provider Notes (Signed)
?[facility] ?Provider Note ? ? ?CSN: [id number] ?Arrival date & time: 02/06/22  1757 ? ?  ? ?History ? ?Chief Complaint  ?Patient presents with  ? Fall  ? ? ?Gregory [name] is a 71 y.o. male. ? ? ?Fall ? ? ?71 year old male presenting to the emergency department as a level 2 trauma after a ground-level mechanical fall at home.  The patient states that he tripped over a rug at home and struck his head on the corner of a wall.  He also struck his left knee and landed on his right shoulder.  He denies any loss of consciousness.  He is currently on aspirin and Plavix.  He was transported to the emergency department where he arrived GCS 15, ABC intact.  He continued complaint of pain in his left knee and right shoulder, worse with range of motion.  He endorsed a mild headache.  He sustained a small abrasion to the top of his scalp.  He states his tetanus is up-to-date. ? ?Home Medications ?Prior to Admission medications   ?Medication Sig Start Date End Date Taking? Authorizing Provider  ?allopurinol (ZYLOPRIM) 300 MG tablet Take 300 mg by mouth daily.    [provider]  ?amLODipine-valsartan (EXFORGE) 10-320 MG per tablet Take 1 tablet by mouth daily.    [provider]  ?atorvastatin (LIPITOR) 20 MG tablet Take 20 mg by mouth daily. 07/22/21   [provider]  ?clopidogrel (PLAVIX) 75 MG tablet TAKE 1 TABLET (75 MG TOTAL) BY MOUTH DAILY. 10/22/14   Jettie Booze, MD  ?furosemide (LASIX) 40 MG tablet TAKE ONE TABLET BY MOUTH EVERY MORNING Needs appointment for further refills 01/11/22   Jettie Booze, MD  ?LANTUS SOLOSTAR 100 UNIT/ML Solostar Pen Inject 15 Units into the skin in the morning. 08/09/21   Bonnielee Haff, MD  ?metFORMIN (GLUCOPHAGE) 1000 MG tablet Take 1,000 mg by mouth 2 (two) times daily with a meal.    [provider]  ?metoprolol tartrate (LOPRESSOR) 25 MG tablet Take 25 mg by mouth daily. 11/01/20   [provider]  ?Multiple Vitamin (MULTIVITAMIN WITH MINERALS) TABS tablet Take 1 tablet by mouth daily. 08/09/21   Bonnielee Haff, MD  ?nitroGLYCERIN (NITROSTAT) 0.4 MG SL tablet Place 1 tablet (0.4 mg total) under the tongue every 5 (five) minutes as needed for chest pain. 08/21/17   Jettie Booze, MD  ?   ? ?Allergies    ?Patient has no known allergies.   ? ?Review of Systems   ?Review of Systems  ?All other systems reviewed and are negative. ? ?Physical Exam ?Updated Vital Signs ?BP 129/72   Pulse 64   Temp 98.6 ?F (37 ?C) (Oral)   Resp 18   Ht '5\' 7"'$  (1.702 m)   Wt 96.6 kg   SpO2 94%   BMI 33.36 kg/m?  ?Physical Exam ?Vitals and nursing note reviewed.  ?Constitutional:   ?   Appearance: He is well-developed.  ?   Comments: GCS 15, ABC intact  ?HENT:  ?   Head: Normocephalic.  ?   Comments: Abrasion to the occiput, hemostatic ?Eyes:  ?   Conjunctiva/sclera: Conjunctivae normal.  ?Neck:  ?   Comments: No midline tenderness to palpation of the cervical spine. ROM intact. ?Cardiovascular:  ?   Rate and Rhythm: Normal rate and regular rhythm.  ?   Heart sounds: No murmur heard. ?Pulmonary:  ?   Effort: Pulmonary effort is normal. No respiratory distress.  ?  Breath sounds: Normal breath sounds.  ?Chest:  ?   Comments: Chest wall stable and non-tender to AP and lateral compression. Clavicles stable and non-tender to AP compression ?Abdominal:  ?   Palpations: Abdomen is soft.  ?   Tenderness: There is no abdominal tenderness.  ?   Comments: Pelvis stable to lateral compression.  ?Musculoskeletal:  ?   Cervical back: Neck supple.  ?   Comments: No midline tenderness to palpation of the thoracic or lumbar spine. Extremities atraumatic with intact ROM with the exception of mild lateral tenderness to palpation of the left knee, some tenderness and pain with range of motion of the right shoulder, distal extremities neurovascular intact with 2+ pulses in all 4 extremities.  ?Skin: ?   General: Skin is warm  and dry.  ?Neurological:  ?   Mental Status: He is alert.  ?   Comments: CN II-XII grossly intact. Moving all four extremities spontaneously and sensation grossly intact.  ? ? ?ED Results / Procedures / Treatments   ?Labs ?(all labs ordered are listed, but only abnormal results are displayed) ?Labs Reviewed  ?COMPREHENSIVE METABOLIC PANEL - Abnormal; Notable for the following components:  ?    Result Value  ? Glucose, Bld 129 (*)   ? BUN 33 (*)   ? Creatinine, Ser 1.48 (*)   ? Total Bilirubin 0.2 (*)   ? GFR, Estimated 50 (*)   ? All other components within normal limits  ?CBC - Abnormal; Notable for the following components:  ? RBC 3.36 (*)   ? Hemoglobin 10.4 (*)   ? HCT 31.4 (*)   ? All other components within normal limits  ?URINALYSIS, ROUTINE W REFLEX MICROSCOPIC - Abnormal; Notable for the following components:  ? Color, Urine STRAW (*)   ? All other components within normal limits  ?I-STAT CHEM 8, ED - Abnormal; Notable for the following components:  ? BUN 32 (*)   ? Creatinine, Ser 1.60 (*)   ? Glucose, Bld 122 (*)   ? Calcium, Ion 1.06 (*)   ? Hemoglobin 10.5 (*)   ? HCT 31.0 (*)   ? All other components within normal limits  ?RESP PANEL BY RT-PCR (FLU A&B, COVID) [id number]  ?LACTIC ACID, PLASMA  ?PROTIME-INR  ?ETHANOL  ?SAMPLE TO BLOOD BANK  ? ? ?EKG ?EKG Interpretation ? ?Date/Time:  Monday February 06 2022 18:02:39 EDT ?Ventricular Rate:  65 ?PR Interval:  201 ?QRS Duration: 93 ?QT Interval:  393 ?QTC Calculation: 409 ?R Axis:   21 ?Text Interpretation: Sinus rhythm Artifact Premature ventricular complexes Confirmed by Regan Lemming ([id number]) on 02/06/2022 8:41:59 PM ? ?Radiology ?CT HEAD WO CONTRAST ? ?Result Date: 02/06/2022 ?CLINICAL DATA:  Head trauma, minor (Age >= 65y) EXAM: CT HEAD WITHOUT CONTRAST TECHNIQUE: Contiguous axial images were obtained from the base of the skull through the vertex without intravenous contrast. RADIATION DOSE REDUCTION: This exam was performed according to the departmental  dose-optimization program which includes automated exposure control, adjustment of the mA and/or kV according to patient size and/or use of iterative reconstruction technique. COMPARISON:  08/02/2021 FINDINGS: Brain: No evidence of acute infarction, hemorrhage, hydrocephalus, extra-axial collection or mass lesion/mass effect. Scattered low-density changes within the periventricular and subcortical white matter compatible with chronic microvascular ischemic change. Mild diffuse cerebral volume loss. Vascular: Atherosclerotic calcifications involving the large vessels of the skull base. No unexpected hyperdense vessel. Skull: Normal. Negative for fracture or focal lesion. Sinuses/Orbits: No acute finding. Other: None. IMPRESSION: 1. No acute intracranial  abnormality. 2. Chronic microvascular ischemic change and cerebral volume loss. Electronically Signed   By: Davina Poke D.O.   On: 02/06/2022 19:00  ? ?CT CERVICAL SPINE WO CONTRAST ? ?Result Date: 02/06/2022 ?CLINICAL DATA:  Neck trauma (Age >= 65y).  Fall. EXAM: CT CERVICAL SPINE WITHOUT CONTRAST TECHNIQUE: Multidetector CT imaging of the cervical spine was performed without intravenous contrast. Multiplanar CT image reconstructions were also generated. RADIATION DOSE REDUCTION: This exam was performed according to the departmental dose-optimization program which includes automated exposure control, adjustment of the mA and/or kV according to patient size and/or use of iterative reconstruction technique. COMPARISON:  None. FINDINGS: Alignment: There is mild reversal of the normal cervical lordosis. No listhesis. Skull base and vertebrae: Craniocervical alignment is normal. The atlantodental interval is not widened. There is no acute fracture of the cervical spine. Vertebral body height is preserved. Soft tissues and spinal canal: The spinal canal is diffusely narrowed secondary to congenital narrowing of the pedicles with resultant moderate to severe central  canal stenosis with abutment and flattening of the thecal sac secondary to superimposed posterior disc osteophyte complex ease at C3-4, C4-5 C5-6, and C6-7 with the AP diameter of the spinal canal measuring roughly 5-6 mm

## 2022-02-06 NOTE — ED Notes (Signed)
Pt transported to CT with this RN and [place], Therapist, sports.  ?

## 2022-02-06 NOTE — ED Triage Notes (Signed)
Pt BIB [facility] from home.  Pt tripped over rug at home and hit his head in the corner of the wall.  Also hit his left knee and right shoulder.  Pt endorses no lost of consciousness.  Hx of MI.  Pt is currently taking Plavix.  ? ?VS BP 138/86 ?

## 2022-02-06 NOTE — ED Notes (Signed)
RN called CT.  CT currently stated they have two level ones and will be giving [place] a call back when they are ready for [place]. ?

## 2022-02-07 DIAGNOSIS — M25562 Pain in left knee: Secondary | ICD-10-CM | POA: Diagnosis not present

## 2022-02-07 DIAGNOSIS — M25511 Pain in right shoulder: Secondary | ICD-10-CM | POA: Diagnosis not present

## 2022-02-07 DIAGNOSIS — W19XXXA Unspecified fall, initial encounter: Secondary | ICD-10-CM | POA: Diagnosis not present

## 2022-02-13 ENCOUNTER — Encounter (HOSPITAL_COMMUNITY)
Admission: RE | Admit: 2022-02-13 | Discharge: 2022-02-13 | Disposition: A | Discharge status: Home / Self Care | Payer: Medicare HMO | Source: Ambulatory Visit | Attending: Urology | Admitting: Urology

## 2022-02-13 DIAGNOSIS — R161 Splenomegaly, not elsewhere classified: Secondary | ICD-10-CM | POA: Diagnosis not present

## 2022-02-13 DIAGNOSIS — N281 Cyst of kidney, acquired: Secondary | ICD-10-CM | POA: Diagnosis not present

## 2022-02-13 DIAGNOSIS — I700 Atherosclerosis of aorta: Secondary | ICD-10-CM | POA: Diagnosis not present

## 2022-02-13 DIAGNOSIS — C61 Malignant neoplasm of prostate: Secondary | ICD-10-CM | POA: Diagnosis not present

## 2022-02-13 DIAGNOSIS — Z8546 Personal history of malignant neoplasm of prostate: Secondary | ICD-10-CM | POA: Diagnosis not present

## 2022-02-13 MED ORDER — TECHNETIUM TC 99M MEDRONATE IV KIT
18.4000 | PACK | Freq: Once | INTRAVENOUS | Status: AC | PRN
  Administered 2022-02-13: 18.4 via INTRAVENOUS

## 2022-02-14 ENCOUNTER — Telehealth: Payer: Self-pay | Admitting: Interventional Cardiology

## 2022-02-14 DIAGNOSIS — E782 Mixed hyperlipidemia: Secondary | ICD-10-CM | POA: Diagnosis not present

## 2022-02-14 DIAGNOSIS — I2510 Atherosclerotic heart disease of native coronary artery without angina pectoris: Secondary | ICD-10-CM | POA: Diagnosis not present

## 2022-02-14 DIAGNOSIS — I10 Essential (primary) hypertension: Secondary | ICD-10-CM | POA: Diagnosis not present

## 2022-02-14 DIAGNOSIS — N1831 Chronic kidney disease, stage 3a: Secondary | ICD-10-CM | POA: Diagnosis not present

## 2022-02-14 DIAGNOSIS — E1122 Type 2 diabetes mellitus with diabetic chronic kidney disease: Secondary | ICD-10-CM | POA: Diagnosis not present

## 2022-02-14 MED ORDER — FUROSEMIDE 40 MG PO TABS: ORAL_TABLET | ORAL | 0 refills | Status: DC

## 2022-02-14 NOTE — Telephone Encounter (Signed)
?*  STAT* If patient is at the pharmacy, call can be transferred to refill team. ? ? ?1. Which medications need to be refilled? (please list name of each medication and dose if known) need new prescription for Furosemide  ? ?2. Which pharmacy/location (including street and city if local pharmacy) is medication to be sent to? [facility] ? ?3. Do they need a 30 day or 90 day supply? 30 days and refills ? ?

## 2022-02-14 NOTE — Telephone Encounter (Signed)
Pt is overdue for an appointment with Dr. Irish Lack, I called pt and left voice mail informing pt that he is overdue for an appointment and that he needed to call before anymore refills or refill with PCP. Only a 15 day supply was sent to pharmacy. Confirmation received.  ?

## 2022-02-20 DIAGNOSIS — C775 Secondary and unspecified malignant neoplasm of intrapelvic lymph nodes: Secondary | ICD-10-CM | POA: Diagnosis not present

## 2022-02-20 DIAGNOSIS — N3946 Mixed incontinence: Secondary | ICD-10-CM | POA: Diagnosis not present

## 2022-02-20 DIAGNOSIS — N5201 Erectile dysfunction due to arterial insufficiency: Secondary | ICD-10-CM | POA: Diagnosis not present

## 2022-02-20 DIAGNOSIS — C61 Malignant neoplasm of prostate: Secondary | ICD-10-CM | POA: Diagnosis not present

## 2022-03-06 IMAGING — DX DG CHEST 1V PORT
1 series · 1 of 1 positions shown · non-contrast
Comparison: 08/02/2021

CLINICAL DATA: Questionable sepsis

EXAM:
PORTABLE CHEST 1 VIEW

[chest ap]
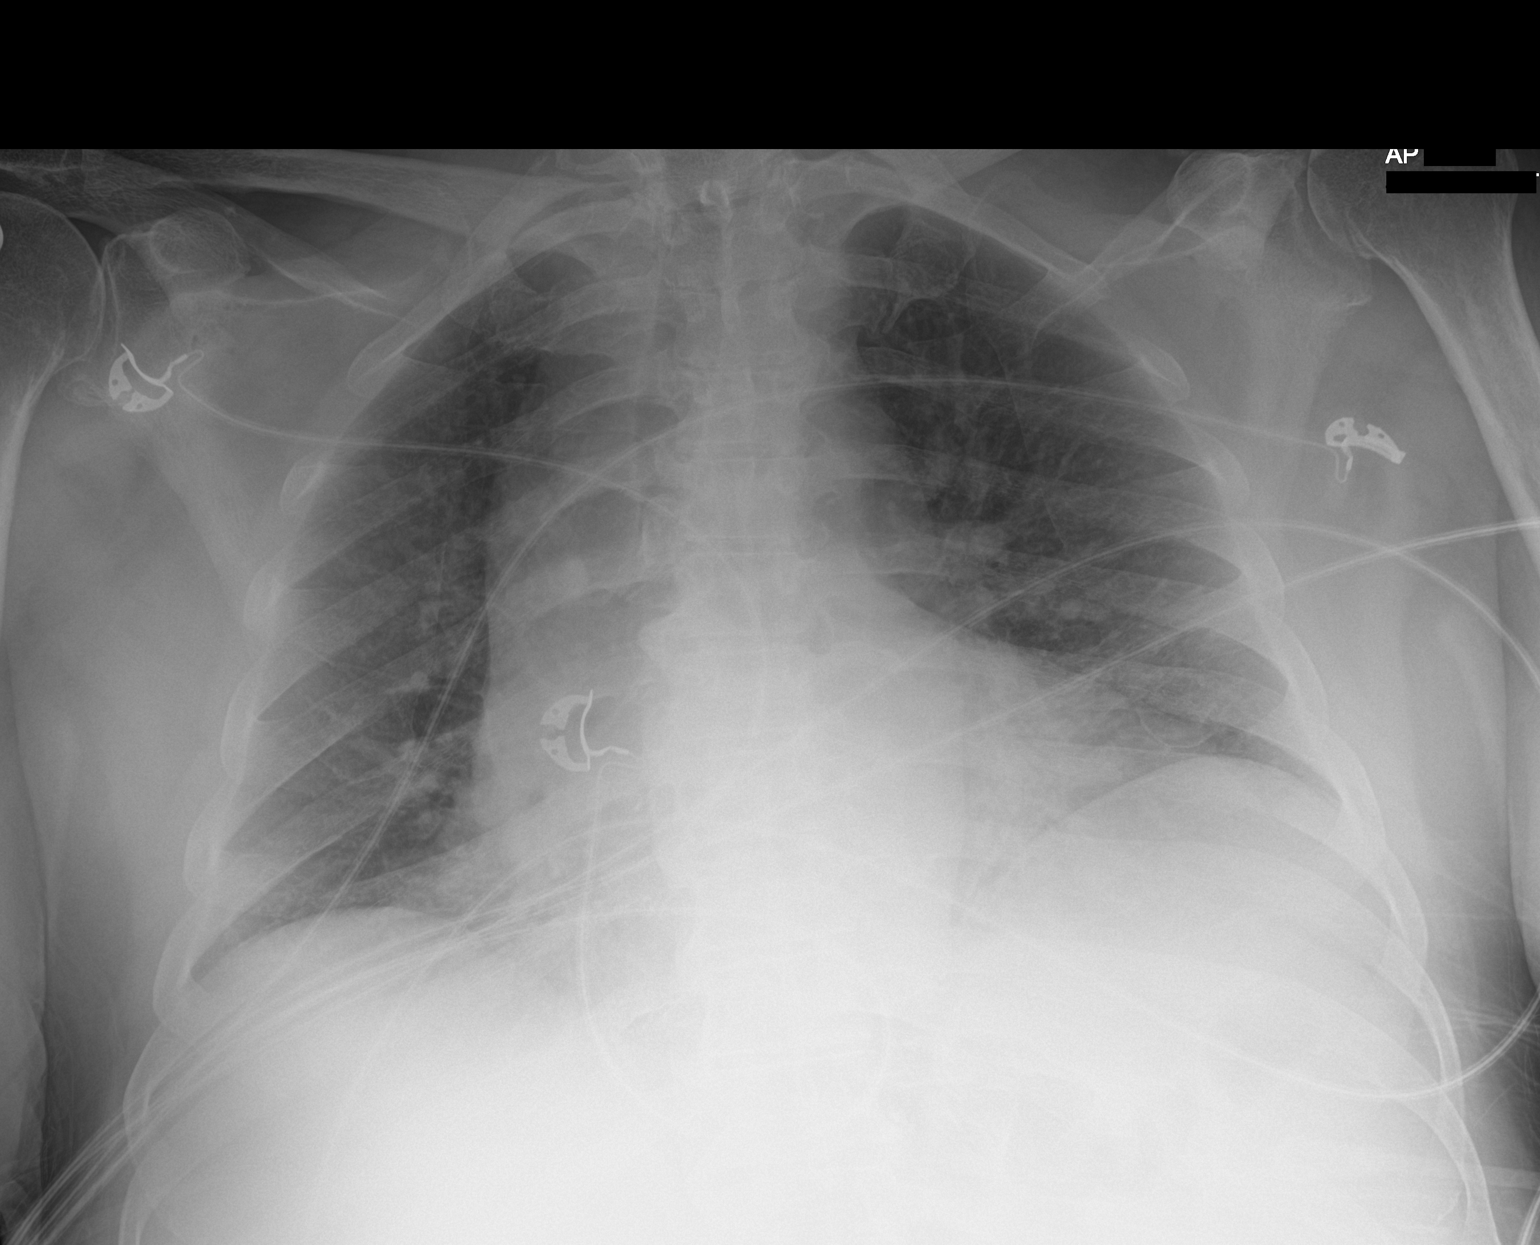

[1 of 1 positions shown; findings below may reference images not displayed]

FINDINGS: Low lung volumes. Unchanged, borderline enlarged cardiac silhouette,
given differences in technique. Redemonstrated hazy bibasilar
opacities, without new focal consolidative opacity. No definite
pleural effusion pneumothorax. No acute osseous abnormality.
IMPRESSION: Redemonstrated hazy bibasilar airspace opacities, which are
nonspecific could represent developing infection. No new focal
pulmonary opacity.

## 2022-03-14 DIAGNOSIS — N5201 Erectile dysfunction due to arterial insufficiency: Secondary | ICD-10-CM | POA: Diagnosis not present

## 2022-03-16 DIAGNOSIS — I2510 Atherosclerotic heart disease of native coronary artery without angina pectoris: Secondary | ICD-10-CM | POA: Diagnosis not present

## 2022-03-16 DIAGNOSIS — I10 Essential (primary) hypertension: Secondary | ICD-10-CM | POA: Diagnosis not present

## 2022-03-16 DIAGNOSIS — E782 Mixed hyperlipidemia: Secondary | ICD-10-CM | POA: Diagnosis not present

## 2022-03-16 DIAGNOSIS — E1122 Type 2 diabetes mellitus with diabetic chronic kidney disease: Secondary | ICD-10-CM | POA: Diagnosis not present

## 2022-03-16 DIAGNOSIS — N1831 Chronic kidney disease, stage 3a: Secondary | ICD-10-CM | POA: Diagnosis not present

## 2022-03-22 ENCOUNTER — Other Ambulatory Visit: Payer: Self-pay | Admitting: Interventional Cardiology

## 2022-04-17 DIAGNOSIS — I2510 Atherosclerotic heart disease of native coronary artery without angina pectoris: Secondary | ICD-10-CM | POA: Diagnosis not present

## 2022-04-17 DIAGNOSIS — E782 Mixed hyperlipidemia: Secondary | ICD-10-CM | POA: Diagnosis not present

## 2022-04-17 DIAGNOSIS — I10 Essential (primary) hypertension: Secondary | ICD-10-CM | POA: Diagnosis not present

## 2022-04-17 DIAGNOSIS — N1831 Chronic kidney disease, stage 3a: Secondary | ICD-10-CM | POA: Diagnosis not present

## 2022-04-17 DIAGNOSIS — E113491 Type 2 diabetes mellitus with severe nonproliferative diabetic retinopathy without macular edema, right eye: Secondary | ICD-10-CM | POA: Diagnosis not present

## 2022-04-20 ENCOUNTER — Other Ambulatory Visit: Payer: Self-pay | Admitting: Interventional Cardiology

## 2022-05-18 DIAGNOSIS — E782 Mixed hyperlipidemia: Secondary | ICD-10-CM | POA: Diagnosis not present

## 2022-05-18 DIAGNOSIS — N1831 Chronic kidney disease, stage 3a: Secondary | ICD-10-CM | POA: Diagnosis not present

## 2022-05-18 DIAGNOSIS — I2510 Atherosclerotic heart disease of native coronary artery without angina pectoris: Secondary | ICD-10-CM | POA: Diagnosis not present

## 2022-05-18 DIAGNOSIS — I10 Essential (primary) hypertension: Secondary | ICD-10-CM | POA: Diagnosis not present

## 2022-05-18 DIAGNOSIS — E1122 Type 2 diabetes mellitus with diabetic chronic kidney disease: Secondary | ICD-10-CM | POA: Diagnosis not present

## 2022-05-23 ENCOUNTER — Other Ambulatory Visit: Payer: Self-pay | Admitting: Interventional Cardiology

## 2022-06-15 DIAGNOSIS — I2510 Atherosclerotic heart disease of native coronary artery without angina pectoris: Secondary | ICD-10-CM | POA: Diagnosis not present

## 2022-06-15 DIAGNOSIS — N1831 Chronic kidney disease, stage 3a: Secondary | ICD-10-CM | POA: Diagnosis not present

## 2022-06-15 DIAGNOSIS — I10 Essential (primary) hypertension: Secondary | ICD-10-CM | POA: Diagnosis not present

## 2022-06-15 DIAGNOSIS — E782 Mixed hyperlipidemia: Secondary | ICD-10-CM | POA: Diagnosis not present

## 2022-06-15 DIAGNOSIS — E1122 Type 2 diabetes mellitus with diabetic chronic kidney disease: Secondary | ICD-10-CM | POA: Diagnosis not present

## 2022-07-13 DIAGNOSIS — N1831 Chronic kidney disease, stage 3a: Secondary | ICD-10-CM | POA: Diagnosis not present

## 2022-07-13 DIAGNOSIS — I2510 Atherosclerotic heart disease of native coronary artery without angina pectoris: Secondary | ICD-10-CM | POA: Diagnosis not present

## 2022-07-13 DIAGNOSIS — E113392 Type 2 diabetes mellitus with moderate nonproliferative diabetic retinopathy without macular edema, left eye: Secondary | ICD-10-CM | POA: Diagnosis not present

## 2022-07-13 DIAGNOSIS — I10 Essential (primary) hypertension: Secondary | ICD-10-CM | POA: Diagnosis not present

## 2022-07-13 DIAGNOSIS — E782 Mixed hyperlipidemia: Secondary | ICD-10-CM | POA: Diagnosis not present

## 2022-08-11 DIAGNOSIS — N1831 Chronic kidney disease, stage 3a: Secondary | ICD-10-CM | POA: Diagnosis not present

## 2022-08-11 DIAGNOSIS — E1122 Type 2 diabetes mellitus with diabetic chronic kidney disease: Secondary | ICD-10-CM | POA: Diagnosis not present

## 2022-08-11 DIAGNOSIS — E782 Mixed hyperlipidemia: Secondary | ICD-10-CM | POA: Diagnosis not present

## 2022-08-11 DIAGNOSIS — I10 Essential (primary) hypertension: Secondary | ICD-10-CM | POA: Diagnosis not present

## 2022-09-20 DIAGNOSIS — E782 Mixed hyperlipidemia: Secondary | ICD-10-CM | POA: Diagnosis not present

## 2022-09-20 DIAGNOSIS — E1169 Type 2 diabetes mellitus with other specified complication: Secondary | ICD-10-CM | POA: Diagnosis not present

## 2022-09-20 DIAGNOSIS — I10 Essential (primary) hypertension: Secondary | ICD-10-CM | POA: Diagnosis not present

## 2022-09-20 DIAGNOSIS — I2510 Atherosclerotic heart disease of native coronary artery without angina pectoris: Secondary | ICD-10-CM | POA: Diagnosis not present

## 2022-10-12 DIAGNOSIS — E1169 Type 2 diabetes mellitus with other specified complication: Secondary | ICD-10-CM | POA: Diagnosis not present

## 2022-10-12 DIAGNOSIS — E7800 Pure hypercholesterolemia, unspecified: Secondary | ICD-10-CM | POA: Diagnosis not present

## 2022-10-12 DIAGNOSIS — I10 Essential (primary) hypertension: Secondary | ICD-10-CM | POA: Diagnosis not present

## 2022-10-12 DIAGNOSIS — N1831 Chronic kidney disease, stage 3a: Secondary | ICD-10-CM | POA: Diagnosis not present

## 2022-10-30 DIAGNOSIS — I2510 Atherosclerotic heart disease of native coronary artery without angina pectoris: Secondary | ICD-10-CM | POA: Diagnosis not present

## 2022-10-30 DIAGNOSIS — E7800 Pure hypercholesterolemia, unspecified: Secondary | ICD-10-CM | POA: Diagnosis not present

## 2022-10-30 DIAGNOSIS — I10 Essential (primary) hypertension: Secondary | ICD-10-CM | POA: Diagnosis not present

## 2022-10-30 DIAGNOSIS — E113392 Type 2 diabetes mellitus with moderate nonproliferative diabetic retinopathy without macular edema, left eye: Secondary | ICD-10-CM | POA: Diagnosis not present

## 2022-10-30 DIAGNOSIS — D649 Anemia, unspecified: Secondary | ICD-10-CM | POA: Diagnosis not present

## 2022-12-10 NOTE — Progress Notes (Deleted)
Office Visit    Patient Name: Gregory [name] Date of Encounter: 12/10/2022  PCP:  Seward Carol, MD   [place]  Cardiologist:  Larae Grooms, MD  Advanced Practice Provider:  No care team member to display Electrophysiologist:  None    HPI    Gregory [name] is a 72 y.o. male with a past medical history significant for overlapping stents to the LAD 8/13, hypertension, hyperlipidemia, lower extremity edema, type 2 diabetes mellitus presents today for overdue follow-up visit.  Hospitalized 4/16 for pneumonia.  Had a cardiology consult due to prolonged run of nonsustained ventricular tachycardia which occurred early in the morning.  Patient was asymptomatic.  He was watched on telemetry and beta-blocker was increased.  He did not have any further episodes on telemetry.  There was no syncope.  He was sent home and felt well.  He had COVID early January 2021 and was hospitalized.  Patient presented with complaints of feeling unwell.  He was seen in the urgent care and COVID test was positive.  Patient noted to be hypoxic with saturations in the 80s on room air.  Patient noted to have elevated inflammatory markers.  Chest x-ray which was done with bilateral infiltrates.  Patient was placed on oxygen.  Procalcitonin was 0.21.  Treated with IV Decadron, remdesivir, Mucinex, Flonase, Atrovent, and Xopenex.  Improved clinically.  Saturation is 90% on 2 L.  Discharged home with oxygen and Decadron 6 mg twice a day to finish the course of 10 days.  Had acute renal failure with COVID but creatinine came down to 1.48 with hydration and holding ARB.  Patient had a 7 beat run of asymptomatic nonsustained V. tach on 10/24/2019.  Potassium at 4.8.  Magnesium 2.1.  Lopressor continued.  Lost some weight with COVID.  Last seen 09/13/2020 and had been back to normal.  Gained his weight back.  No longer requiring oxygen.  Not doing much walking.  Still working as a Art gallery manager.   Wanted to go back to the gym.  Today, he***  Past Medical History    Past Medical History:  Diagnosis Date   Arthritis    Cancer ([place])    Coronary artery disease    Gout    Hyperlipidemia    Hypertension    Myocardial infarction ([place]) 05/2012   "mild"   Swelling of left lower extremity    LLE; "happens often"   Type II diabetes mellitus ([place])    Past Surgical History:  Procedure Laterality Date   BACK SURGERY     CORONARY ANGIOPLASTY WITH STENT PLACEMENT  06/05/2012   "2; total of 2"   LEFT HEART CATHETERIZATION WITH CORONARY ANGIOGRAM N/A 06/05/2012   Procedure: LEFT HEART CATHETERIZATION WITH CORONARY ANGIOGRAM;  Surgeon: Jettie Booze, MD;  Location: [facility] CATH LAB;  Service: Cardiovascular;  Laterality: N/A;  possible PCI   LUMBAR DISC SURGERY  1990's   LYMPHADENECTOMY Bilateral 02/25/2014   Procedure: [facility] LYMPH NODE DISSECTION";  Surgeon: Molli Hazard, MD;  Location: [facility] ORS;  Service: Urology;  Laterality: Bilateral;   PROSTATE SURGERY     ROBOT ASSISTED LAPAROSCOPIC RADICAL PROSTATECTOMY N/A 02/25/2014   Procedure: ROBOTIC ASSISTED LAPAROSCOPIC RADICAL  PROSTATECTOMY,  UMBILICAL HERNIA REPAIR;  Surgeon: Molli Hazard, MD;  Location: [facility] ORS;  Service: Urology;  Laterality: N/A;    Allergies  No Known Allergies   EKGs/Labs/Other Studies Reviewed:   The following studies were reviewed today: ***  EKG:  EKG is ***  ordered today.  The ekg ordered today demonstrates ***  Recent Labs: 02/06/2022: ALT 17; BUN 32; Creatinine, Ser 1.60; Hemoglobin 10.5; Platelets 211; Potassium 4.1; Sodium 138  Recent Lipid Panel    Component Value Date/Time   CHOL 176 09/13/2020 0928   TRIG 268 (H) 09/13/2020 0928   HDL 33 (L) 09/13/2020 0928   CHOLHDL 5.3 (H) 09/13/2020 0928   LDLCALC 97 09/13/2020 0928    Risk Assessment/Calculations:  {Does this patient have ATRIAL FIBRILLATION?:[phone]}  Home Medications   No outpatient medications  have been marked as taking for the 12/11/22 encounter (Appointment) with Elgie Collard, PA-C.     Review of Systems   ***   All other systems reviewed and are otherwise negative except as noted above.  Physical Exam    VS:  There were no vitals taken for this visit. , BMI There is no height or weight on file to calculate BMI.  Wt Readings from Last 3 Encounters:  02/06/22 213 lb (96.6 kg)  08/03/21 253 lb (114.8 kg)  09/13/20 234 lb (106.1 kg)     GEN: Well nourished, well developed, in no acute distress. HEENT: normal. Neck: Supple, no JVD, carotid bruits, or masses. Cardiac: ***RRR, no murmurs, rubs, or gallops. No clubbing, cyanosis, edema.  ***Radials/PT 2+ and equal bilaterally.  Respiratory:  ***Respirations regular and unlabored, clear to auscultation bilaterally. GI: Soft, nontender, nondistended. MS: No deformity or atrophy. Skin: Warm and dry, no rash. Neuro:  Strength and sensation are intact. Psych: Normal affect.  Assessment & Plan    CAD DM Hyperlipidemia Hypertension Chronic renal insufficiency   No BP recorded.  {Refresh Note OR Click here to enter BP  :1}***      Disposition: Follow up {follow up:15908} with Larae Grooms, MD or APP.  Signed, Elgie Collard, PA-C 12/10/2022, 6:05 PM [facility]

## 2022-12-11 ENCOUNTER — Ambulatory Visit: Payer: Medicare HMO | Admitting: Physician Assistant

## 2022-12-11 DIAGNOSIS — I2510 Atherosclerotic heart disease of native coronary artery without angina pectoris: Secondary | ICD-10-CM

## 2022-12-11 DIAGNOSIS — E1369 Other specified diabetes mellitus with other specified complication: Secondary | ICD-10-CM

## 2022-12-11 DIAGNOSIS — N184 Chronic kidney disease, stage 4 (severe): Secondary | ICD-10-CM

## 2022-12-11 DIAGNOSIS — I10 Essential (primary) hypertension: Secondary | ICD-10-CM

## 2022-12-11 DIAGNOSIS — E785 Hyperlipidemia, unspecified: Secondary | ICD-10-CM

## 2022-12-13 DIAGNOSIS — I10 Essential (primary) hypertension: Secondary | ICD-10-CM | POA: Diagnosis not present

## 2022-12-13 DIAGNOSIS — E1169 Type 2 diabetes mellitus with other specified complication: Secondary | ICD-10-CM | POA: Diagnosis not present

## 2022-12-13 DIAGNOSIS — E7800 Pure hypercholesterolemia, unspecified: Secondary | ICD-10-CM | POA: Diagnosis not present

## 2022-12-13 DIAGNOSIS — I2510 Atherosclerotic heart disease of native coronary artery without angina pectoris: Secondary | ICD-10-CM | POA: Diagnosis not present

## 2022-12-13 DIAGNOSIS — N1831 Chronic kidney disease, stage 3a: Secondary | ICD-10-CM | POA: Diagnosis not present

## 2023-01-29 ENCOUNTER — Ambulatory Visit: Payer: Medicare PPO | Admitting: Physician Assistant

## 2023-03-12 DIAGNOSIS — J40 Bronchitis, not specified as acute or chronic: Secondary | ICD-10-CM | POA: Diagnosis not present

## 2023-03-25 NOTE — Progress Notes (Deleted)
Office Visit    Patient Name: Gregory [name] Date of Encounter: 03/25/2023  PCP:  Renford Dills, MD   [facility]  Cardiologist:  Lance Muss, MD  Advanced Practice Provider:  No care team member to display Electrophysiologist:  None   HPI    Gregory [name] is a 72 y.o. male with a past medical history of overlapping stents to the LAD, DES 8/13 presents today for overdue follow-up appointment.  History includes hospitalization 4/16 for pneumonia.  He did have cardiology consult due to prolonged run of nonsustained ventricular tachycardia which occurred early in the morning.  The patient was asymptomatic.  He was watched on telemetry and beta-blockade was increased.  He did not  have any further episodes on telemetry.  No syncope.  Was sent home as he felt better.  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  Had COVID-19 in early January 2021 and was hospitalized.  He presented with complaints of feeling unwell.  Was seen at urgent care and COVID test was positive.  Patient noted to be hypoxic with sats in the 80s on room air.  Patient noted to have elevated inflammatory markers.  Chest x-ray which was done showed bilateral infiltrates.  Placed on O2.  Treated with IV Decadron, remdesivir, Mucinex, Flonase, Atrovent, and Xopenex.  Patient was discharged home on oxygen.  History of 7 beats of asymptomatic nonsustained V. tach on 10/24/2019.  Potassium 4.8.  Magnesium 2.1.  Lopressor was continued.  Lost some weight with COVID.  Had gained it back.  No longer on oxygen.  He was last seen 09/11/2020.  Doing much better at that time and walking.  He was still working as a Paediatric nurse and a paper route.  Wanted to go back to the gym.  Today, he***  Past Medical History    Past Medical History:  Diagnosis Date   Arthritis    Cancer ([facility])    Coronary artery disease    Gout    Hyperlipidemia    Hypertension    Myocardial infarction ([facility]) 05/2012    "mild"   Swelling of left lower extremity    LLE; "happens often"   Type II diabetes mellitus ([facility])    Past Surgical History:  Procedure Laterality Date   BACK SURGERY     CORONARY ANGIOPLASTY WITH STENT PLACEMENT  06/05/2012   "2; total of 2"   LEFT HEART CATHETERIZATION WITH CORONARY ANGIOGRAM N/A 06/05/2012   Procedure: LEFT HEART CATHETERIZATION WITH CORONARY ANGIOGRAM;  Surgeon: Corky Crafts, MD;  Location: [facility] CATH LAB;  Service: Cardiovascular;  Laterality: N/A;  possible PCI   LUMBAR DISC SURGERY  1990's   LYMPHADENECTOMY Bilateral 02/25/2014   Procedure: [facility] LYMPH NODE DISSECTION";  Surgeon: Milford Cage, MD;  Location: [facility];  Service: Urology;  Laterality: Bilateral;   PROSTATE SURGERY     ROBOT ASSISTED LAPAROSCOPIC RADICAL PROSTATECTOMY N/A 02/25/2014   Procedure: ROBOTIC ASSISTED LAPAROSCOPIC RADICAL  PROSTATECTOMY,  UMBILICAL HERNIA REPAIR;  Surgeon: Milford Cage, MD;  Location: [facility];  Service: Urology;  Laterality: N/A;    Allergies  No Known Allergies  History of Present Illness    Gregory [name] is a 72 y.o. male with a hx of *** last seen ***.   EKGs/Labs/Other Studies Reviewed:   The following studies were reviewed today: Cardiac Studies & Procedures  EKG:  EKG is *** ordered today.  The ekg ordered today demonstrates ***  Recent Labs: No results found for requested labs within last 365 days.  Recent Lipid Panel    Component Value Date/Time   CHOL 176 09/13/2020 0928   TRIG 268 (H) 09/13/2020 0928   HDL 33 (L) 09/13/2020 0928   CHOLHDL 5.3 (H) 09/13/2020 0928   LDLCALC 97 09/13/2020 0928    Risk Assessment/Calculations:  {Does this patient have ATRIAL FIBRILLATION?:[phone]}  Home Medications   No outpatient medications have been marked as taking for the 03/26/23 encounter (Appointment) with Sharlene Dory, PA-C.     Review of Systems   ***   All other systems reviewed and  are otherwise negative except as noted above.  Physical Exam    VS:  There were no vitals taken for this visit. , BMI There is no height or weight on file to calculate BMI.  Wt Readings from Last 3 Encounters:  02/06/22 213 lb (96.6 kg)  08/03/21 253 lb (114.8 kg)  09/13/20 234 lb (106.1 kg)     GEN: Well nourished, well developed, in no acute distress. HEENT: normal. Neck: Supple, no JVD, carotid bruits, or masses. Cardiac: ***RRR, no murmurs, rubs, or gallops. No clubbing, cyanosis, edema.  ***Radials/PT 2+ and equal bilaterally.  Respiratory:  ***Respirations regular and unlabored, clear to auscultation bilaterally. GI: Soft, nontender, nondistended. MS: No deformity or atrophy. Skin: Warm and dry, no rash. Neuro:  Strength and sensation are intact. Psych: Normal affect.  Assessment & Plan    CAD Diabetes mellitus Hyperlipidemia Hypertension Chronic renal insufficiency Weight gain  No BP recorded.  {Refresh Note OR Click here to enter BP  :1}***      Disposition: Follow up {follow up:15908} with Lance Muss, MD or APP.  Signed, Sharlene Dory, PA-C 03/25/2023, 9:29 PM [facility]

## 2023-03-26 ENCOUNTER — Encounter: Payer: Self-pay | Admitting: Physician Assistant

## 2023-03-26 ENCOUNTER — Ambulatory Visit: Payer: Medicare HMO | Attending: Physician Assistant | Admitting: Physician Assistant

## 2023-03-26 DIAGNOSIS — I10 Essential (primary) hypertension: Secondary | ICD-10-CM

## 2023-03-26 DIAGNOSIS — I2510 Atherosclerotic heart disease of native coronary artery without angina pectoris: Secondary | ICD-10-CM

## 2023-03-26 DIAGNOSIS — R635 Abnormal weight gain: Secondary | ICD-10-CM

## 2023-03-26 DIAGNOSIS — N189 Chronic kidney disease, unspecified: Secondary | ICD-10-CM

## 2023-03-26 DIAGNOSIS — E785 Hyperlipidemia, unspecified: Secondary | ICD-10-CM

## 2023-05-27 NOTE — Progress Notes (Unsigned)
Cardiology Office Note:  .   Date:  05/28/2023  ID:  Gregory [name], DOB [birth year], MRN [id number] PCP: Renford Dills, MD  [facility] Providers Cardiologist:  Lance Muss, MD {  History of Present Illness: .   Gregory [name] is a 72 y.o. male with a past medical history of overlapping stents to the LAD, DES in 8/13 here for follow-up appointment.  Was hospitalized 4/16 for pneumonia.  Did have a cardiology consult due to prolonged run of nonsustained ventricular tachycardia which occurred early in the morning.  Patient was asymptomatic at that time.  Watched on telemetry and beta-blockade was increased.  He did  not have any further episodes.  No syncope.  Works as a Paediatric nurse.  Wife passed away few years ago.  Had COVID early January 2021 and was hospitalized.  He had ARF with COVID but creatinine came down to 1.48 with hydration and holding ARB.  Patient had a 7 beat run of asymptomatic nonsustained V. tach on 10/24/2019.  Potassium 4.8.  Been examined 2.1.  Lopressor continued.  He lost some weight when he was sick with COVID.  Since then was back to normal when he was last seen 09/13/2020.  No longer using oxygen.  Denied chest pain, dizziness, leg edema, nitroglycerin use, orthopnea, palpitations, PND, shortness of breath, and syncope.  Was not doing much walking.  Wants to get back to the gym.  Today, he tells me he wants to get off the sodas. He usually drinks 3-4 a day. He has some swelling on the left side which is from an old achilles tendon injury.  Most recent triglycerides were 218, above goal.  He has been losing some weight.  Encouraged him to get back in the gym.  Blood pressure well-controlled today, 124/60.  No issues with medications.  Recent lab work reviewed and LDL at goal.  No recent chest pains or shortness of breath.  Reports no shortness of breath nor dyspnea on exertion. Reports no chest pain, pressure, or tightness. No edema, orthopnea, PND. Reports no  palpitations.     ROS: Pertinent ROS in HPI  Studies Reviewed: Marland Kitchen   EKG Interpretation Date/Time:  Monday May 28 2023 10:32:44 EDT Ventricular Rate:  79 PR Interval:  178 QRS Duration:  88 QT Interval:  354 QTC Calculation: 405 R Axis:   17  Text Interpretation: Sinus rhythm with Premature atrial complexes Nonspecific T wave abnormality When compared with ECG of 06-Feb-2022 18:02, PREVIOUS ECG IS PRESENT Confirmed by Jari Favre [phone]) on 05/28/2023 3:08:09 PM    No recent testing to review      Physical Exam:   VS:  BP 124/60 (BP Location: Left Arm, Patient Position: Sitting, Cuff Size: Large)   Pulse 79   Ht 5\' 7"  (1.702 m)   Wt 212 lb 9.6 oz (96.4 kg)   SpO2 96%   BMI 33.30 kg/m    Wt Readings from Last 3 Encounters:  05/28/23 212 lb 9.6 oz (96.4 kg)  02/06/22 213 lb (96.6 kg)  08/03/21 253 lb (114.8 kg)    GEN: Well nourished, well developed in no acute distress NECK: No JVD; No carotid bruits CARDIAC: RRR, no murmurs, rubs, gallops RESPIRATORY:  Clear to auscultation without rales, wheezing or rhonchi  ABDOMEN: Soft, non-tender, non-distended EXTREMITIES:  No edema; No deformity   ASSESSMENT AND PLAN: .   1.  CAD -no chest pains or SOB -Continue current medications which include Lipitor 20 mg daily, Plavix 75 mg  daily, Lasix 40 mg daily, Lopressor 25 mg daily, nitro as needed, rosuvastatin 20 mg daily  2.  DM -he is trying to lay off the sodas  -Triglycerides are high at 218, A1c 6.4 -Continue follow-up with primary and diet modifications  3.  HLD -fasted lab in November ordered today -Hopefully, triglycerides will be at goal  4.  HTN -BP looked good today -Would continue current medications -Continue low-sodium, heart healthy diet -Continue daily monitoring of blood pressure  5.  Chronic renal insufficiency -creatinine 1.48 -He does not have a nephrologist but Dr. Nehemiah Settle looks after his kidneys -Continue to avoid nephrotoxic medications  6.   Weight loss -he is now losing weight, was 250 lbs -Discussed diet modifications  7. PACs -Asymptomatic at this time -He had PVCs on his last EKG -Would consider getting a monitor to look for burden if patient has symptoms      Dispo: He can follow-up in 6 months with APP or MD  Signed, Sharlene Dory, PA-C

## 2023-05-28 ENCOUNTER — Ambulatory Visit: Payer: Medicare HMO | Attending: Physician Assistant | Admitting: Physician Assistant

## 2023-05-28 ENCOUNTER — Encounter: Payer: Self-pay | Admitting: Physician Assistant

## 2023-05-28 VITALS — BP 124/60 | HR 79 | Ht 67.0 in | Wt 212.6 lb

## 2023-05-28 DIAGNOSIS — R635 Abnormal weight gain: Secondary | ICD-10-CM | POA: Diagnosis not present

## 2023-05-28 DIAGNOSIS — N179 Acute kidney failure, unspecified: Secondary | ICD-10-CM | POA: Diagnosis not present

## 2023-05-28 DIAGNOSIS — E785 Hyperlipidemia, unspecified: Secondary | ICD-10-CM

## 2023-05-28 DIAGNOSIS — I10 Essential (primary) hypertension: Secondary | ICD-10-CM

## 2023-05-28 DIAGNOSIS — I2510 Atherosclerotic heart disease of native coronary artery without angina pectoris: Secondary | ICD-10-CM | POA: Diagnosis not present

## 2023-05-28 DIAGNOSIS — N183 Chronic kidney disease, stage 3 unspecified: Secondary | ICD-10-CM | POA: Diagnosis not present

## 2023-05-28 NOTE — Patient Instructions (Signed)
Medication Instructions:   Your physician recommends that you continue on your current medications as directed. Please refer to the Current Medication list given to you today.   *If you need a refill on your cardiac medications before your next appointment, please call your pharmacy*   Lab Work:  MAKE SURE IN NOV GET LABS LFT AND LIPIDS    If you have labs (blood work) drawn today and your tests are completely normal, you will receive your results only by: MyChart Message (if you have MyChart) OR A paper copy in the mail If you have any lab test that is abnormal or we need to change your treatment, we will call you to review the results.   Testing/Procedures: NONE ORDERED  TODAY     Follow-Up: At [facility], you and your health needs are our priority.  As part of our continuing mission to provide you with exceptional heart care, we have created designated Provider Care Teams.  These Care Teams include your primary Cardiologist (physician) and Advanced Practice Providers (APPs -  Physician Assistants and Nurse Practitioners) who all work together to provide you with the care you need, when you need it.  We recommend signing up for the patient portal called "MyChart".  Sign up information is provided on this After Visit Summary.  MyChart is used to connect with patients for Virtual Visits (Telemedicine).  Patients are able to view lab/test results, encounter notes, upcoming appointments, etc.  Non-urgent messages can be sent to your provider as well.   To learn more about what you can do with MyChart, go to [email or web address].    Your next appointment:    6 month(s)  Provider:    Dr Lynnette Caffey     Other Instructions

## 2023-07-16 DIAGNOSIS — H524 Presbyopia: Secondary | ICD-10-CM | POA: Diagnosis not present

## 2023-07-24 ENCOUNTER — Other Ambulatory Visit (HOSPITAL_COMMUNITY): Payer: Self-pay

## 2023-07-24 ENCOUNTER — Other Ambulatory Visit: Payer: Self-pay

## 2023-07-24 MED ORDER — ALLOPURINOL 300 MG PO TABS
300.0000 mg | ORAL_TABLET | Freq: Every day | ORAL | 2 refills | Status: AC
  Filled 2023-07-24: qty 90, 90d supply, fill #0

## 2023-08-06 ENCOUNTER — Other Ambulatory Visit (HOSPITAL_COMMUNITY): Payer: Self-pay

## 2023-09-12 ENCOUNTER — Ambulatory Visit
Admission: RE | Admit: 2023-09-12 | Discharge: 2023-09-12 | Disposition: A | Discharge status: Home / Self Care | Payer: Medicare HMO | Source: Ambulatory Visit | Attending: Internal Medicine | Admitting: Internal Medicine

## 2023-09-12 ENCOUNTER — Other Ambulatory Visit: Payer: Self-pay | Admitting: Internal Medicine

## 2023-09-12 DIAGNOSIS — R0602 Shortness of breath: Secondary | ICD-10-CM | POA: Diagnosis not present

## 2023-09-12 DIAGNOSIS — I700 Atherosclerosis of aorta: Secondary | ICD-10-CM | POA: Diagnosis not present

## 2023-09-12 DIAGNOSIS — I10 Essential (primary) hypertension: Secondary | ICD-10-CM | POA: Diagnosis not present

## 2023-09-12 DIAGNOSIS — R42 Dizziness and giddiness: Secondary | ICD-10-CM | POA: Diagnosis not present

## 2023-09-12 DIAGNOSIS — I2510 Atherosclerotic heart disease of native coronary artery without angina pectoris: Secondary | ICD-10-CM | POA: Diagnosis not present

## 2023-09-18 ENCOUNTER — Other Ambulatory Visit (HOSPITAL_COMMUNITY): Payer: Self-pay

## 2023-09-18 MED ORDER — CIPROFLOXACIN HCL 250 MG PO TABS
250.0000 mg | ORAL_TABLET | Freq: Two times a day (BID) | ORAL | 0 refills | Status: DC
Start: 2023-09-18 — End: 2024-03-31
  Filled 2023-09-18: qty 10, 5d supply, fill #0

## 2023-12-01 NOTE — Progress Notes (Signed)
 Cardiology Office Note:   Date:  12/03/2023  ID:  Gregory [name], DOB [birth year], MRN [id number] PCP:  Merl Star, MD  [facility] Providers Cardiologist:  Alyssa Backbone, MD Referring MD: Merl Star, MD  Chief Complaint/Reason for Referral: Follow-up for coronary artery disease ASSESSMENT:    1. Acute coronary syndrome (HCC)   2. Coronary artery disease involving native coronary artery of native heart without angina pectoris   3. Chronic diastolic heart failure (HCC)   4. Type 2 diabetes mellitus with complication, without long-term current use of insulin  (HCC)   5. Aortic atherosclerosis (HCC)   6. Hyperlipidemia associated with type 2 diabetes mellitus (HCC)   7. Hypertension associated with diabetes (HCC)   8. Stage 3a chronic kidney disease (HCC)   9. BMI 36.0-36.9,adult     PLAN:   In order of problems listed above: Acute coronary syndrome: Patient sustained an acute coronary syndrome in 2013.  For this reason his LDL goal is less than 55.  Continue indefinite Plavix  monotherapy 75 mg daily, start as needed nitroglycerin . Coronary artery disease: Continue Plavix  75 mg daily, Lipitor 20 mg daily and blood pressure control. Chronic diastolic heart failure: Continue amlodipine -valsartan  10/320 mg, Lopressor  25 mg , Lasix  40 mg daily, and start Jardiance  10 mg daily. Aortic atherosclerosis: Continue Plavix  75 mg daily, atorvastatin  20 mg daily and strict blood pressure control. Hyperlipidemia:: Discussed above the patient's LDL goal is less than 55.  Check lipid panel LFTs and LP(a) today. Hypertension: Continue amlodipine  valsartan  10/320mg  and change Lopressor  as detailed above.  Goal blood pressures less than 130/80 mmHg. CKD stage IIIa: Continue amlodipine /valsartan  10/320 mg and start Jardiance  10 mg daily for renal protection in diabetic. Elevated BMI: Refer to pharmacy for recommendations regarding GLP-1 receptor agonist therapy to decrease patient's risk of  future myocardial infarction in the context of type 2 diabetes and elevated BMI.            Dispo:  Return in about 6 months (around 06/01/2024).      Medication Adjustments/Labs and Tests Ordered: Current medicines are reviewed at length with the patient today.  Concerns regarding medicines are outlined above.  The following changes have been made:     Labs/tests ordered: Orders Placed This Encounter  Procedures   Lipoprotein A (LPA)   Lipid panel   Hepatic function panel   AMB Referral to [facility] Pharm-D    Medication Changes: Meds ordered this encounter  Medications   empagliflozin  (JARDIANCE ) 10 MG TABS tablet    Sig: Take 1 tablet (10 mg total) by mouth daily before breakfast.    Dispense:  28 tablet    Refill:  0   empagliflozin  (JARDIANCE ) 10 MG TABS tablet    Sig: Take 1 tablet (10 mg total) by mouth daily before breakfast.    Dispense:  30 tablet    Refill:  11    Current medicines are reviewed at length with the patient today.  The patient does not have concerns regarding medicines.  I spent 33 minutes reviewing all clinical data during and prior to this visit including all relevant imaging studies, laboratories, clinical information from other health systems and prior notes from both Cardiology and other specialties, interviewing the patient, conducting a complete physical examination, and coordinating care in order to formulate a comprehensive and personalized evaluation and treatment plan.   History of Present Illness:      FOCUSED PROBLEM LIST:   CAD ACS 2013 PCI DES x 2 mLAD Diastolic dysfunction  LVH, G1 DD, EF 55 to 60%, no significant valve issues TTE 2016 Hyperlipidemia Goal LDL less than 55 Aortic atherosclerosis Chest CT 2022 Hypertension T2DM On insulin  CKD 3A BMI 36  2/25: The patient returns for routine follow-up for coronary artery disease.  Patient was last seen in August of last year.  At that point in time he was doing well, was  without cardiovascular complaints, and his blood pressure was well-controlled.  Lipid panel was ordered but not drawn.  The patient is well today.  He denies any exertional angina or exertional dyspnea.  He has had no issues with his Plavix  monotherapy.  He has some chronic left lower extremity edema related to Achilles tendon tear some years ago.  This is relatively unchanged.  He denies any paroxysmal nocturnal dyspnea, palpitations, presyncope, or or syncope.  He does get an atypical right flank pain on occasion.  It happens very rarely.  He fortunately does not required any as needed nitroglycerin , hospitalizations, or emergency room evaluations.  He is otherwise well and without significant complaints today.          Current Medications: Current Meds  Medication Sig   allopurinol  (ZYLOPRIM ) 300 MG tablet Take 1 tablet (300 mg total) by mouth daily.   amLODipine -valsartan  (EXFORGE ) 10-320 MG per tablet Take 1 tablet by mouth daily.   atorvastatin  (LIPITOR) 20 MG tablet Take 20 mg by mouth daily.   ciprofloxacin  (CIPRO ) 250 MG tablet Take 1 tablet (250 mg total) by mouth every 12 (twelve) hours for 5 days   clopidogrel  (PLAVIX ) 75 MG tablet TAKE 1 TABLET (75 MG TOTAL) BY MOUTH DAILY.   empagliflozin  (JARDIANCE ) 10 MG TABS tablet Take 1 tablet (10 mg total) by mouth daily before breakfast.   empagliflozin  (JARDIANCE ) 10 MG TABS tablet Take 1 tablet (10 mg total) by mouth daily before breakfast.   furosemide  (LASIX ) 40 MG tablet TAKE ONE TABLET BY MOUTH EVERY MORNING. Please make overdue appt with Dr. Jacquelynn Matter before anymore refills. Thank you 3rd and Final Attempt   LANTUS  SOLOSTAR 100 UNIT/ML Solostar Pen Inject 15 Units into the skin in the morning.   metFORMIN  (GLUCOPHAGE ) 1000 MG tablet Take 1,000 mg by mouth 2 (two) times daily with a meal.   metoprolol  tartrate (LOPRESSOR ) 25 MG tablet Take 25 mg by mouth daily.   Multiple Vitamin (MULTIVITAMIN WITH MINERALS) TABS tablet Take 1 tablet by  mouth daily.   nitroGLYCERIN  (NITROSTAT ) 0.4 MG SL tablet Place 1 tablet (0.4 mg total) under the tongue every 5 (five) minutes as needed for chest pain.   rosuvastatin (CRESTOR) 20 MG tablet Take 20 mg by mouth daily.     Review of Systems:   Please see the history of present illness.    All other systems reviewed and are negative.     EKGs/Labs/Other Test Reviewed:   EKG: EKG performed 2024 demonstrates sinus rhythm with PACs  EKG Interpretation Date/Time:    Ventricular Rate:    PR Interval:    QRS Duration:    QT Interval:    QTC Calculation:   R Axis:      Text Interpretation:           Risk Assessment/Calculations:          Physical Exam:   VS:  BP 136/64   Pulse 64   Ht 5' 7.5" (1.715 m)   Wt 225 lb 12.8 oz (102.4 kg)   SpO2 98%   BMI 34.84 kg/m     Repeat  blood pressure on my measure 125/75     Wt Readings from Last 3 Encounters:  12/03/23 225 lb 12.8 oz (102.4 kg)  05/28/23 212 lb 9.6 oz (96.4 kg)  02/06/22 213 lb (96.6 kg)      GENERAL:  No apparent distress, AOx3 HEENT:  No carotid bruits, +2 carotid impulses, no scleral icterus CAR: RRR no murmurs, gallops, rubs, or thrills RES:  Clear to auscultation bilaterally ABD:  Soft, nontender, nondistended, positive bowel sounds x 4 VASC:  +2 radial pulses, +2 carotid pulses NEURO:  CN 2-12 grossly intact; motor and sensory grossly intact PSYCH:  No active depression or anxiety EXT:  No edema, ecchymosis, or cyanosis  Signed, Xianna Siverling K Kevin Space, MD  12/03/2023 4:00 PM    [facility] [address] [place], [place], [place]  [address] Phone: [phone]; Fax: [phone]   Note:  This document was prepared using Dragon voice recognition software and may include unintentional dictation errors.

## 2023-12-03 ENCOUNTER — Ambulatory Visit: Payer: Medicare PPO | Attending: Internal Medicine | Admitting: Internal Medicine

## 2023-12-03 ENCOUNTER — Encounter: Payer: Self-pay | Admitting: Internal Medicine

## 2023-12-03 VITALS — BP 136/64 | HR 64 | Ht 67.5 in | Wt 225.8 lb

## 2023-12-03 DIAGNOSIS — E1169 Type 2 diabetes mellitus with other specified complication: Secondary | ICD-10-CM | POA: Diagnosis not present

## 2023-12-03 DIAGNOSIS — E1159 Type 2 diabetes mellitus with other circulatory complications: Secondary | ICD-10-CM

## 2023-12-03 DIAGNOSIS — Z6836 Body mass index (BMI) 36.0-36.9, adult: Secondary | ICD-10-CM

## 2023-12-03 DIAGNOSIS — I2510 Atherosclerotic heart disease of native coronary artery without angina pectoris: Secondary | ICD-10-CM

## 2023-12-03 DIAGNOSIS — I249 Acute ischemic heart disease, unspecified: Secondary | ICD-10-CM | POA: Diagnosis not present

## 2023-12-03 DIAGNOSIS — I5032 Chronic diastolic (congestive) heart failure: Secondary | ICD-10-CM | POA: Diagnosis not present

## 2023-12-03 DIAGNOSIS — I700 Atherosclerosis of aorta: Secondary | ICD-10-CM | POA: Diagnosis not present

## 2023-12-03 DIAGNOSIS — I152 Hypertension secondary to endocrine disorders: Secondary | ICD-10-CM

## 2023-12-03 DIAGNOSIS — E785 Hyperlipidemia, unspecified: Secondary | ICD-10-CM | POA: Diagnosis not present

## 2023-12-03 DIAGNOSIS — N1831 Chronic kidney disease, stage 3a: Secondary | ICD-10-CM

## 2023-12-03 DIAGNOSIS — E118 Type 2 diabetes mellitus with unspecified complications: Secondary | ICD-10-CM | POA: Diagnosis not present

## 2023-12-03 MED ORDER — EMPAGLIFLOZIN 10 MG PO TABS: 10.0000 mg | ORAL_TABLET | Freq: Every day | ORAL | 0 refills | Status: DC

## 2023-12-03 MED ORDER — EMPAGLIFLOZIN 10 MG PO TABS: 10.0000 mg | ORAL_TABLET | Freq: Every day | ORAL | 11 refills | Status: DC

## 2023-12-03 NOTE — Patient Instructions (Signed)
 Medication Instructions:  Your physician has recommended you make the following change in your medication:  1.) start Jardiance  10 mg - one tablet daily before breakfast  *If you need a refill on your cardiac medications before your next appointment, please call your pharmacy*   Lab Work: Today: lipids, liver, Lpa  If you have labs (blood work) drawn today and your tests are completely normal, you will receive your results only by: MyChart Message (if you have MyChart) OR A paper copy in the mail If you have any lab test that is abnormal or we need to change your treatment, we will call you to review the results.   Testing/Procedures: none   Follow-Up: At [facility], you and your health needs are our priority.  As part of our continuing mission to provide you with exceptional heart care, we have created designated Provider Care Teams.  These Care Teams include your primary Cardiologist (physician) and Advanced Practice Providers (APPs -  Physician Assistants and Nurse Practitioners) who all work together to provide you with the care you need, when you need it.   Your next appointment:   6 month(s)  Provider:   Lovette Rud, PA-C       Other Instructions You have been referred to clinical pharmacy team for elevated BMI

## 2023-12-05 LAB — LIPID PANEL
Chol/HDL Ratio: 3.9 {ratio} (ref 0.0–5.0)
Cholesterol, Total: 135 mg/dL (ref 100–199)
HDL: 35 mg/dL — ABNORMAL LOW (ref >39)
LDL Chol Calc (NIH): 68 mg/dL (ref 0–99)
Triglycerides: 194 mg/dL — ABNORMAL HIGH (ref 0–149)
VLDL Cholesterol Cal: 32 mg/dL (ref 5–40)

## 2023-12-05 LAB — HEPATIC FUNCTION PANEL
ALT: 9 [IU]/L (ref 0–44)
AST: 11 [IU]/L (ref 0–40)
Albumin: 4.3 g/dL (ref 3.8–4.8)
Alkaline Phosphatase: 62 [IU]/L (ref 44–121)
Bilirubin Total: 0.3 mg/dL (ref 0.0–1.2)
Bilirubin, Direct: 0.11 mg/dL (ref 0.00–0.40)
Total Protein: 6.6 g/dL (ref 6.0–8.5)

## 2023-12-05 LAB — LIPOPROTEIN A (LPA): Lipoprotein (a): 105.9 nmol/L — ABNORMAL HIGH (ref <75.0)

## 2023-12-24 DIAGNOSIS — I2510 Atherosclerotic heart disease of native coronary artery without angina pectoris: Secondary | ICD-10-CM | POA: Diagnosis not present

## 2023-12-24 DIAGNOSIS — E7800 Pure hypercholesterolemia, unspecified: Secondary | ICD-10-CM | POA: Diagnosis not present

## 2023-12-24 DIAGNOSIS — E1122 Type 2 diabetes mellitus with diabetic chronic kidney disease: Secondary | ICD-10-CM | POA: Diagnosis not present

## 2023-12-24 DIAGNOSIS — Z1331 Encounter for screening for depression: Secondary | ICD-10-CM | POA: Diagnosis not present

## 2023-12-24 DIAGNOSIS — I10 Essential (primary) hypertension: Secondary | ICD-10-CM | POA: Diagnosis not present

## 2023-12-24 DIAGNOSIS — I700 Atherosclerosis of aorta: Secondary | ICD-10-CM | POA: Diagnosis not present

## 2023-12-24 DIAGNOSIS — Z23 Encounter for immunization: Secondary | ICD-10-CM | POA: Diagnosis not present

## 2023-12-24 DIAGNOSIS — E113392 Type 2 diabetes mellitus with moderate nonproliferative diabetic retinopathy without macular edema, left eye: Secondary | ICD-10-CM | POA: Diagnosis not present

## 2023-12-24 DIAGNOSIS — N1831 Chronic kidney disease, stage 3a: Secondary | ICD-10-CM | POA: Diagnosis not present

## 2023-12-24 DIAGNOSIS — Z0000 Encounter for general adult medical examination without abnormal findings: Secondary | ICD-10-CM | POA: Diagnosis not present

## 2024-01-21 ENCOUNTER — Ambulatory Visit: Payer: Medicare PPO | Attending: Internal Medicine

## 2024-01-21 NOTE — Progress Notes (Deleted)
 Patient ID: Gregory [name]                 DOB: Oct 05, 1951                    MRN: [id number]     HPI: Gregory [name] is a 73 y.o. male patient referred to pharmacy clinic by Dr. Lynnette Caffey to initiate GLP1-RA therapy. PMH is significant for DM, CAD, CKD, HTN and obesity. Most recent BMI ***.  Rosuvastatin?- clean up medlist Lipids- goal <55 DM A1C 6.2 7 in 2021     Baseline weight and BMI: *** Current weight and BMI: *** Current meds that affect weight: ****  *** If diabetic and on insulin/sulfonylurea, can consider reducing dose to reduce risk of hypoglycemia  *** Follow-up visit  Assess % weight loss Assess adverse effects Missed doses  Diet:   Exercise:   Family History:   Social History:   Labs: Lab Results  Component Value Date   HGBA1C 6.8 (H) 08/06/2021    Wt Readings from Last 1 Encounters:  12/03/23 225 lb 12.8 oz (102.4 kg)    BP Readings from Last 1 Encounters:  12/03/23 136/64   Pulse Readings from Last 1 Encounters:  12/03/23 64       Component Value Date/Time   CHOL 135 12/03/2023 1642   TRIG 194 (H) 12/03/2023 1642   HDL 35 (L) 12/03/2023 1642   CHOLHDL 3.9 12/03/2023 1642   LDLCALC 68 12/03/2023 1642    Past Medical History:  Diagnosis Date   Arthritis    Cancer (HCC)    Coronary artery disease    Gout    Hyperlipidemia    Hypertension    Myocardial infarction (HCC) 05/2012   "mild"   Swelling of left lower extremity    LLE; "happens often"   Type II diabetes mellitus (HCC)     Current Outpatient Medications on File Prior to Visit  Medication Sig Dispense Refill   allopurinol (ZYLOPRIM) 300 MG tablet Take 1 tablet (300 mg total) by mouth daily. 90 tablet 2   amLODipine-valsartan (EXFORGE) 10-320 MG per tablet Take 1 tablet by mouth daily.     atorvastatin (LIPITOR) 20 MG tablet Take 20 mg by mouth daily.     ciprofloxacin (CIPRO) 250 MG tablet Take 1 tablet (250 mg total) by mouth every 12 (twelve) hours for 5 days 10  tablet 0   clopidogrel (PLAVIX) 75 MG tablet TAKE 1 TABLET (75 MG TOTAL) BY MOUTH DAILY. 90 tablet 0   empagliflozin (JARDIANCE) 10 MG TABS tablet Take 1 tablet (10 mg total) by mouth daily before breakfast. 28 tablet 0   empagliflozin (JARDIANCE) 10 MG TABS tablet Take 1 tablet (10 mg total) by mouth daily before breakfast. 30 tablet 11   furosemide (LASIX) 40 MG tablet TAKE ONE TABLET BY MOUTH EVERY MORNING. Please make overdue appt with Dr. Eldridge Dace before anymore refills. Thank you 3rd and Final Attempt 15 tablet 0   LANTUS SOLOSTAR 100 UNIT/ML Solostar Pen Inject 15 Units into the skin in the morning. 15 mL 11   metFORMIN (GLUCOPHAGE) 1000 MG tablet Take 1,000 mg by mouth 2 (two) times daily with a meal.     metoprolol tartrate (LOPRESSOR) 25 MG tablet Take 25 mg by mouth daily.     Multiple Vitamin (MULTIVITAMIN WITH MINERALS) TABS tablet Take 1 tablet by mouth daily.     nitroGLYCERIN (NITROSTAT) 0.4 MG SL tablet Place 1 tablet (0.4 mg total) under the  tongue every 5 (five) minutes as needed for chest pain. 25 tablet 3   rosuvastatin (CRESTOR) 20 MG tablet Take 20 mg by mouth daily.     No current facility-administered medications on file prior to visit.    No Known Allergies   Assessment/Plan:  1. Weight loss - Patient has not met goal of at least 5% of body weight loss with comprehensive lifestyle modifications alone in the past 3-6 months. Pharmacotherapy is appropriate to pursue as augmentation. Will start ***. Confirmed patient not ***pregnant and no personal or family history of medullary thyroid carcinoma (MTC) or Multiple Endocrine Neoplasia syndrome type 2 (MEN 2). Injection technique reviewed at today's visit.  Advised patient on common side effects including nausea, diarrhea, dyspepsia, decreased appetite, and fatigue. Counseled patient on reducing meal size and how to titrate medication to minimize side effects. Counseled patient to call if intolerable side effects or if  experiencing dehydration, abdominal pain, or dizziness. Patient will adhere to dietary modifications and will target at least 150 minutes of moderate intensity exercise weekly.   Follow up in 1 month via telephone for tolerability update and dose titration.

## 2024-03-24 ENCOUNTER — Ambulatory Visit
Attending: Pharmacist Clinician (PhC)/ Clinical Pharmacy Specialist | Admitting: Pharmacist Clinician (PhC)/ Clinical Pharmacy Specialist

## 2024-03-24 NOTE — Progress Notes (Deleted)
 Office Visit    Patient Name: Gregory [name] Date of Encounter: 03/24/2024  Primary Care Provider:  Merl Star, MD Primary Cardiologist:  Avery Bodo, MD  Chief Complaint    Weight management  Significant Past Medical History   CAD 2013 MI, stent x 2  CHF Chronic diastolic  DM2 Currently on metformin , empagliflozin , Lantus   HLD LDL on atorvastatin  20  HTN Near goal, on amlodipine /valsartan , metoprolol   CKD IIIa - on empagliflozin  for renal protection    No Known Allergies  History of Present Illness    Gregory [name] is a 73 y.o. male patient of Dr Lorie Rook, in the office today to discuss options for weight management.    *** If diabetic and on insulin /sulfonylurea, can consider reducing dose to reduce risk of hypoglycemia   Current weight management medications:   Previously tried meds:   Current meds that may affect weight:   Baseline weight/BMI:   Insurance payor:   Diet:   Exercise:   Family History:   Confirmed patient not ***pregnant and no personal or family history of medullary thyroid  carcinoma (MTC) or Multiple Endocrine Neoplasia syndrome type 2 (MEN 2).   Social History:   Tobacco:  Alcohol:  Caffeine:   Adherence Assessment  Do you ever forget to take your medication? [] Yes [] No  Do you ever skip doses due to side effects? [] Yes [] No  Do you have trouble affording your medicines? [] Yes [] No  Are you ever unable to pick up your medication due to transportation difficulties? [] Yes [] No  Do you ever stop taking your medications because you don't believe they are helping? [] Yes [] No  Do you check your weight daily? [] Yes [] No   Adherence strategy: ***  Barriers to obtaining medications: ***   Accessory Clinical Findings    Lab Results  Component Value Date   CREATININE 1.60 (H) 02/06/2022   BUN 32 (H) 02/06/2022   NA 138 02/06/2022   K 4.1 02/06/2022   CL 107 02/06/2022   CO2 23 02/06/2022   Lab  Results  Component Value Date   ALT 9 12/03/2023   AST 11 12/03/2023   ALKPHOS 62 12/03/2023   BILITOT 0.3 12/03/2023   Lab Results  Component Value Date   HGBA1C 6.8 (H) 08/06/2021      Home Medications/Allergies    Current Outpatient Medications  Medication Sig Dispense Refill   allopurinol  (ZYLOPRIM ) 300 MG tablet Take 1 tablet (300 mg total) by mouth daily. 90 tablet 2   amLODipine -valsartan  (EXFORGE ) 10-320 MG per tablet Take 1 tablet by mouth daily.     atorvastatin  (LIPITOR) 20 MG tablet Take 20 mg by mouth daily.     ciprofloxacin  (CIPRO ) 250 MG tablet Take 1 tablet (250 mg total) by mouth every 12 (twelve) hours for 5 days 10 tablet 0   clopidogrel  (PLAVIX ) 75 MG tablet TAKE 1 TABLET (75 MG TOTAL) BY MOUTH DAILY. 90 tablet 0   empagliflozin  (JARDIANCE ) 10 MG TABS tablet Take 1 tablet (10 mg total) by mouth daily before breakfast. 28 tablet 0   empagliflozin  (JARDIANCE ) 10 MG TABS tablet Take 1 tablet (10 mg total) by mouth daily before breakfast. 30 tablet 11   furosemide  (LASIX ) 40 MG tablet TAKE ONE TABLET BY MOUTH EVERY MORNING. Please make overdue appt with Dr. Jacquelynn Matter before anymore refills. Thank you 3rd and Final Attempt 15 tablet 0   LANTUS  SOLOSTAR 100 UNIT/ML Solostar Pen Inject 15 Units into the skin in the morning. 15 mL  11   metFORMIN  (GLUCOPHAGE ) 1000 MG tablet Take 1,000 mg by mouth 2 (two) times daily with a meal.     metoprolol  tartrate (LOPRESSOR ) 25 MG tablet Take 25 mg by mouth daily.     Multiple Vitamin (MULTIVITAMIN WITH MINERALS) TABS tablet Take 1 tablet by mouth daily.     nitroGLYCERIN  (NITROSTAT ) 0.4 MG SL tablet Place 1 tablet (0.4 mg total) under the tongue every 5 (five) minutes as needed for chest pain. 25 tablet 3   rosuvastatin (CRESTOR) 20 MG tablet Take 20 mg by mouth daily.     No current facility-administered medications for this visit.     No Known Allergies  Assessment & Plan    No problem-specific Assessment & Plan notes found  for this encounter.   Latavious Bitter PharmD CPP [facility]  [address] [place], [place] [address] [phone]

## 2024-03-31 ENCOUNTER — Telehealth: Payer: Self-pay | Admitting: Pharmacist

## 2024-03-31 ENCOUNTER — Telehealth: Payer: Self-pay | Admitting: Pharmacy Technician

## 2024-03-31 ENCOUNTER — Other Ambulatory Visit (HOSPITAL_COMMUNITY): Payer: Self-pay

## 2024-03-31 ENCOUNTER — Ambulatory Visit: Attending: Internal Medicine | Admitting: Pharmacist

## 2024-03-31 VITALS — Wt 207.0 lb

## 2024-03-31 DIAGNOSIS — I2510 Atherosclerotic heart disease of native coronary artery without angina pectoris: Secondary | ICD-10-CM

## 2024-03-31 DIAGNOSIS — I5032 Chronic diastolic (congestive) heart failure: Secondary | ICD-10-CM | POA: Diagnosis not present

## 2024-03-31 DIAGNOSIS — E782 Mixed hyperlipidemia: Secondary | ICD-10-CM

## 2024-03-31 DIAGNOSIS — E1159 Type 2 diabetes mellitus with other circulatory complications: Secondary | ICD-10-CM

## 2024-03-31 DIAGNOSIS — Z794 Long term (current) use of insulin: Secondary | ICD-10-CM | POA: Diagnosis not present

## 2024-03-31 MED ORDER — METOPROLOL SUCCINATE ER 25 MG PO TB24: 25.0000 mg | ORAL_TABLET | Freq: Every day | ORAL | 3 refills | Status: AC

## 2024-03-31 MED ORDER — REPATHA SURECLICK 140 MG/ML ~~LOC~~ SOAJ
1.0000 mL | SUBCUTANEOUS | 3 refills | Status: AC
  Filled 2024-03-31: qty 6, 84d supply, fill #0
  Filled 2024-05-28 – 2024-06-10 (×3): qty 6, 84d supply, fill #1

## 2024-03-31 MED ORDER — EMPAGLIFLOZIN 10 MG PO TABS
10.0000 mg | ORAL_TABLET | Freq: Every day | ORAL | 3 refills | Status: AC
  Filled 2024-03-31: qty 90, 90d supply, fill #0
  Filled 2024-07-14: qty 90, 90d supply, fill #1

## 2024-03-31 NOTE — Telephone Encounter (Signed)
 Pharmacy Patient Advocate Encounter   Received notification from Physician's Office that prior authorization for Repatha is required/requested.   Insurance verification completed.   The patient is insured through [employer] .   Per test claim: PA required; PA submitted to above mentioned insurance via CoverMyMeds Key/confirmation #/EOC [id number] Status is pending

## 2024-03-31 NOTE — Telephone Encounter (Signed)
 Patient Advocate Encounter   The patient was approved for a [employer] grant that will help cover the cost of Jardiance  Total amount awarded, 4500.00.  Effective: 03/01/24 - 02/28/25   [id number] AOZ:[id number] [id number] [id number]  [employer] ID: [id number]   Pharmacy provided with approval and processing information. Patient informed via call

## 2024-03-31 NOTE — Telephone Encounter (Signed)
 Pharmacy Patient Advocate Encounter  Received notification from [employer] that Prior Authorization for Repatha has been APPROVED from 10/24/23 to 10/22/24  $242.99- one month  PA #/Case ID/Reference #: [id number]

## 2024-03-31 NOTE — Patient Instructions (Addendum)
 I will submit a prior authorization for Repatha. I will call you once I hear back. Please call me at [phone] with any questions.   Repatha is a cholesterol medication that improved your body's ability to get rid of "bad cholesterol" known as LDL. It can lower your LDL up to 60%! It is an injection that is given under the skin every 2 weeks. The medication often requires a prior authorization from your insurance company. We will take care of submitting all the necessary information to your insurance company to get it approved. The most common side effects of Repatha include runny nose, symptoms of the common cold, rarely flu or flu-like symptoms, back/muscle pain in about 3-4% of the patients, and redness, pain, or bruising at the injection site. Tell your healthcare provider if you have any side effect that bothers you or that does not go away.   Start Jardiance  10mg  daily - this will be delivered to you from [facility]

## 2024-03-31 NOTE — Telephone Encounter (Signed)
 Patient Advocate Encounter   The patient was approved for a [employer] grant that will help cover the cost of Repatha Total amount awarded, 2500.00.  Effective: 03/01/24 - 02/28/25   WUJ:[id number] NWG:[id number] [id number]  [employer] ID: [id number]   Pharmacy provided with approval and processing information. Patient informed via call

## 2024-03-31 NOTE — Assessment & Plan Note (Signed)
 Assessment: Last A1c 6.2 well-controlled on metformin  and Lantus  We did discuss GLP-1 therapy but cost seems to be a big barrier Patient also has not attempted weight loss on his own We discussed increasing physical activity and cutting back on sugary drinks  Plan: Work on lifestyle modification including increased physical activity and decrease sugar intake

## 2024-03-31 NOTE — Assessment & Plan Note (Signed)
 Assessment: LDL-C above goal of less than 55 on rosuvastatin 20 mg daily Triglycerides slightly elevated at 194 Reports compliance with rosuvastatin 20 mg daily Limited physical activity Eats out for all his meals-discussed limiting fried foods and red meats Discussed increasing vegetable intake and limiting starchy foods and pasta Discussed increasing physical activity-rejoining [employer] Discussed PCSK9 inhibitor versus ezetimibe  Plan: Submit prior authorization for Repatha Will need healthwell grant Will have grant information added to [employer] and have [employer] out Labs in 3 months

## 2024-03-31 NOTE — Assessment & Plan Note (Signed)
 Assessment: Patient currently not using furosemide  States that he does get swelling in 1 leg that he had Achilles surgery on.  Reports swelling improves overnight Denies any shortness of breath He did take the Jardiance  samples he was given but never got prescription from mail-order pharmacy He has $450 deductible and $47 co-pay per month  Plan: Get patient a cardiomyopathy healthwell grant Will send Rx to [facility] for delivery

## 2024-03-31 NOTE — Progress Notes (Signed)
 Patient ID: Gregory [name]                 DOB: 05/23/1951                    MRN: [id number]     HPI: Gregory [name] is a 73 y.o. male patient referred to pharmacy clinic by [facility]. PMH is significant for CAD s/p ACS in 2013, chronic diastolic HF, DM, HDL, CKD, and obesity. Most recent BMI 31.94 kg/m .  Patient presents today to Pharm.D. clinic.  Last A1c in March was 6.2.  He used to go to [employer] prior to [employer].  Currently the only physical activity he does yard work.  We talked about the importance of going back to the gym or finding other ways to increase physical activity. He eats out for all his meals. Lives alone and doesn't want to cook for just himself.  Does not like leftovers.  Of note he has been taking metoprolol  tartrate 25 mg daily because he finds it hard to cut them in half.  Will change to metoprolol  succinate 25 mg daily.   He took the samples of Jardinace 10mg  daily. Did not have any issues with them. Never did get the rx from [facility]. He has $450 deductible and $47 copay. Cost is a concern.   Lipid Medications: rosuvastatin 20mg  daily LDL-C goal:<55 ApoB goal:<70  Diet:  Breakfast: egg/cheese biscuit (sometimes)  Lunch:  Dinner: [employer]- fish, fried chicken, green beans, mac and cheese [employer]- prime rib, ribs Drink: soda-sometimes diet sometimes regular  Exercise: yard work  Family History:  Family History  Problem Relation Age of Onset   Breast cancer Mother    Asthma Father    Parkinson's disease Father     Social History: no ETOH, no tobacco  Labs: Lab Results  Component Value Date   HGBA1C 6.8 (H) 08/06/2021  Lipid Panel 12/03/23 LDL-C 68, TG 194, TC 135, HDL 35 LP(a) 105.9 A1C 6.2 on 12/25/23  Wt Readings from Last 1 Encounters:  03/31/24 207 lb (93.9 kg)    BP Readings from Last 1 Encounters:  12/03/23 136/64   Pulse Readings from Last 1 Encounters:  12/03/23 64       Component Value Date/Time   CHOL 135 12/03/2023  1642   TRIG 194 (H) 12/03/2023 1642   HDL 35 (L) 12/03/2023 1642   CHOLHDL 3.9 12/03/2023 1642   LDLCALC 68 12/03/2023 1642    Past Medical History:  Diagnosis Date   Arthritis    Cancer (HCC)    Coronary artery disease    Gout    Hyperlipidemia    Hypertension    Myocardial infarction (HCC) 05/2012   "mild"   Swelling of left lower extremity    LLE; "happens often"   Type II diabetes mellitus (HCC)     Current Outpatient Medications on File Prior to Visit  Medication Sig Dispense Refill   allopurinol  (ZYLOPRIM ) 300 MG tablet Take 1 tablet (300 mg total) by mouth daily. 90 tablet 2   amLODipine -valsartan  (EXFORGE ) 10-320 MG per tablet Take 1 tablet by mouth daily.     clopidogrel  (PLAVIX ) 75 MG tablet TAKE 1 TABLET (75 MG TOTAL) BY MOUTH DAILY. 90 tablet 0   LANTUS  SOLOSTAR 100 UNIT/ML Solostar Pen Inject 15 Units into the skin in the morning. (Patient taking differently: Inject 30 Units into the skin in the morning.) 15 mL 11   metFORMIN  (GLUCOPHAGE ) 1000 MG tablet Take 1,000  mg by mouth 2 (two) times daily with a meal.     rosuvastatin (CRESTOR) 20 MG tablet Take 20 mg by mouth daily.     empagliflozin  (JARDIANCE ) 10 MG TABS tablet Take 1 tablet (10 mg total) by mouth daily before breakfast. (Patient not taking: Reported on 03/31/2024) 28 tablet 0   empagliflozin  (JARDIANCE ) 10 MG TABS tablet Take 1 tablet (10 mg total) by mouth daily before breakfast. (Patient not taking: Reported on 03/31/2024) 30 tablet 11   furosemide  (LASIX ) 40 MG tablet TAKE ONE TABLET BY MOUTH EVERY MORNING. Please make overdue appt with Dr. Jacquelynn Matter before anymore refills. Thank you 3rd and Final Attempt (Patient not taking: Reported on 03/31/2024) 15 tablet 0   nitroGLYCERIN  (NITROSTAT ) 0.4 MG SL tablet Place 1 tablet (0.4 mg total) under the tongue every 5 (five) minutes as needed for chest pain. 25 tablet 3   No current facility-administered medications on file prior to visit.    No Known  Allergies   Assessment/Plan:  1. Weight loss /DM Assessment: Last A1c 6.2 well-controlled on metformin  and Lantus  We did discuss GLP-1 therapy but cost seems to be a big barrier Patient also has not attempted weight loss on his own We discussed increasing physical activity and cutting back on sugary drinks  Plan: Work on lifestyle modification including increased physical activity and decrease sugar intake   2. Diastolic CHF Assessment: Patient currently not using furosemide  States that he does get swelling in 1 leg that he had Achilles surgery on.  Reports swelling improves overnight Denies any shortness of breath He did take the Jardiance  samples he was given but never got prescription from mail-order pharmacy He has $450 deductible and $47 co-pay per month  Plan: Get patient a cardiomyopathy healthwell grant Will send Rx to [facility] for delivery-   3. HLD- Assessment: LDL-C above goal of less than 55 on rosuvastatin 20 mg daily Triglycerides slightly elevated at 194 Reports compliance with rosuvastatin 20 mg daily Limited physical activity Eats out for all his meals-discussed limiting fried foods and red meats Discussed increasing vegetable intake and limiting starchy foods and pasta Discussed increasing physical activity-rejoining [employer] Discussed PCSK9 inhibitor versus ezetimibe  Plan: Submit prior authorization for Repatha Will need [employer] Will have grant information added to [employer] and have [employer] out Labs in 3 months  Kaiser Belluomini D Lamoine Magallon, Pharm.Monika Annas, CPP  [facility] [address], [place], [place] [address]  Phone: [phone]; Fax: [phone]

## 2024-04-01 ENCOUNTER — Other Ambulatory Visit (HOSPITAL_COMMUNITY): Payer: Self-pay

## 2024-04-01 ENCOUNTER — Other Ambulatory Visit: Payer: Self-pay

## 2024-04-01 NOTE — Telephone Encounter (Signed)
 Spoke with patient. Advised we were able to get grants for both repatha and Jardiance . Rxs were sent to [facility] yesterday. Should arrive in the next day or two. CMP, lipid and apoB in 2 months- labs ordered. Pt aware

## 2024-04-09 ENCOUNTER — Telehealth: Payer: Self-pay | Admitting: Pharmacist

## 2024-04-09 NOTE — Telephone Encounter (Signed)
 Pt called with questions on how to use Repatha . Directions reviewed with him.All questions answered

## 2024-05-28 ENCOUNTER — Other Ambulatory Visit (HOSPITAL_COMMUNITY): Payer: Self-pay

## 2024-06-30 DIAGNOSIS — I10 Essential (primary) hypertension: Secondary | ICD-10-CM | POA: Diagnosis not present

## 2024-06-30 DIAGNOSIS — E113392 Type 2 diabetes mellitus with moderate nonproliferative diabetic retinopathy without macular edema, left eye: Secondary | ICD-10-CM | POA: Diagnosis not present

## 2024-06-30 DIAGNOSIS — I700 Atherosclerosis of aorta: Secondary | ICD-10-CM | POA: Diagnosis not present

## 2024-06-30 DIAGNOSIS — I2510 Atherosclerotic heart disease of native coronary artery without angina pectoris: Secondary | ICD-10-CM | POA: Diagnosis not present

## 2024-06-30 DIAGNOSIS — E1122 Type 2 diabetes mellitus with diabetic chronic kidney disease: Secondary | ICD-10-CM | POA: Diagnosis not present

## 2024-06-30 DIAGNOSIS — E7800 Pure hypercholesterolemia, unspecified: Secondary | ICD-10-CM | POA: Diagnosis not present

## 2024-06-30 DIAGNOSIS — N1831 Chronic kidney disease, stage 3a: Secondary | ICD-10-CM | POA: Diagnosis not present

## 2024-07-04 ENCOUNTER — Other Ambulatory Visit (HOSPITAL_COMMUNITY): Payer: Self-pay

## 2024-07-04 ENCOUNTER — Telehealth: Payer: Self-pay | Admitting: Pharmacist

## 2024-07-04 MED ORDER — ROSUVASTATIN CALCIUM 10 MG PO TABS
10.0000 mg | ORAL_TABLET | Freq: Every day | ORAL | 3 refills | Status: AC
Start: 2024-07-04 — End: 2024-10-05
  Filled 2024-07-04: qty 90, 90d supply, fill #0

## 2024-07-04 NOTE — Telephone Encounter (Signed)
 Lipid Panel w/reflex Reviewed date:07/01/2024 08:59:09 AM Interpretation: Performing Lab: Notes/Report: Testing Performed at: [facility], [address], [address], [place], [place] [address]  Cholesterol 59 <200 mg/dL    CHOL/HDL 1.7 7.9-5.9 Ratio    HDLD 34 30-70 mg/dL Values below 40 mg/dL indicate increased risk factor  Triglyceride 104 0-199 mg/dL    NHDL 25 9-870 mg/dL Range dependent upon risk factors.  LDL Chol Calc (NIH) 5     Called pt- he states he had labs at PCP- found in care everywhere. LDL-C is 5.   Kidney function stable, although baseline is old  Recommended he decrease rosuvastatin  to 10mg  daily and continue Repatha .

## 2024-07-14 ENCOUNTER — Other Ambulatory Visit (HOSPITAL_COMMUNITY): Payer: Self-pay

## 2024-07-14 DIAGNOSIS — Z23 Encounter for immunization: Secondary | ICD-10-CM | POA: Diagnosis not present

## 2024-08-10 ENCOUNTER — Emergency Department (HOSPITAL_BASED_OUTPATIENT_CLINIC_OR_DEPARTMENT_OTHER): Admitting: Radiology

## 2024-08-10 ENCOUNTER — Observation Stay (HOSPITAL_BASED_OUTPATIENT_CLINIC_OR_DEPARTMENT_OTHER)
Admission: EM | Admit: 2024-08-10 | Discharge: 2024-08-12 | Disposition: A | Discharge status: Home / Self Care | Attending: Internal Medicine | Admitting: Internal Medicine

## 2024-08-10 ENCOUNTER — Encounter (HOSPITAL_BASED_OUTPATIENT_CLINIC_OR_DEPARTMENT_OTHER): Payer: Self-pay | Admitting: Emergency Medicine

## 2024-08-10 DIAGNOSIS — I2510 Atherosclerotic heart disease of native coronary artery without angina pectoris: Secondary | ICD-10-CM | POA: Diagnosis not present

## 2024-08-10 DIAGNOSIS — E1122 Type 2 diabetes mellitus with diabetic chronic kidney disease: Secondary | ICD-10-CM | POA: Diagnosis not present

## 2024-08-10 DIAGNOSIS — Z6833 Body mass index (BMI) 33.0-33.9, adult: Secondary | ICD-10-CM | POA: Insufficient documentation

## 2024-08-10 DIAGNOSIS — M19011 Primary osteoarthritis, right shoulder: Secondary | ICD-10-CM | POA: Diagnosis not present

## 2024-08-10 DIAGNOSIS — Z794 Long term (current) use of insulin: Secondary | ICD-10-CM

## 2024-08-10 DIAGNOSIS — E1169 Type 2 diabetes mellitus with other specified complication: Secondary | ICD-10-CM | POA: Insufficient documentation

## 2024-08-10 DIAGNOSIS — E669 Obesity, unspecified: Secondary | ICD-10-CM | POA: Insufficient documentation

## 2024-08-10 DIAGNOSIS — M1A9XX0 Chronic gout, unspecified, without tophus (tophi): Secondary | ICD-10-CM | POA: Diagnosis not present

## 2024-08-10 DIAGNOSIS — Z79899 Other long term (current) drug therapy: Secondary | ICD-10-CM | POA: Diagnosis not present

## 2024-08-10 DIAGNOSIS — N1831 Chronic kidney disease, stage 3a: Secondary | ICD-10-CM | POA: Insufficient documentation

## 2024-08-10 DIAGNOSIS — Z8679 Personal history of other diseases of the circulatory system: Secondary | ICD-10-CM | POA: Diagnosis not present

## 2024-08-10 DIAGNOSIS — R079 Chest pain, unspecified: Principal | ICD-10-CM

## 2024-08-10 DIAGNOSIS — I10 Essential (primary) hypertension: Secondary | ICD-10-CM | POA: Diagnosis present

## 2024-08-10 DIAGNOSIS — I214 Non-ST elevation (NSTEMI) myocardial infarction: Secondary | ICD-10-CM | POA: Diagnosis present

## 2024-08-10 DIAGNOSIS — M109 Gout, unspecified: Secondary | ICD-10-CM | POA: Insufficient documentation

## 2024-08-10 DIAGNOSIS — M79601 Pain in right arm: Secondary | ICD-10-CM | POA: Diagnosis not present

## 2024-08-10 DIAGNOSIS — R7989 Other specified abnormal findings of blood chemistry: Secondary | ICD-10-CM

## 2024-08-10 DIAGNOSIS — F10920 Alcohol use, unspecified with intoxication, uncomplicated: Secondary | ICD-10-CM | POA: Insufficient documentation

## 2024-08-10 DIAGNOSIS — I5032 Chronic diastolic (congestive) heart failure: Secondary | ICD-10-CM | POA: Insufficient documentation

## 2024-08-10 DIAGNOSIS — E785 Hyperlipidemia, unspecified: Secondary | ICD-10-CM | POA: Insufficient documentation

## 2024-08-10 DIAGNOSIS — I130 Hypertensive heart and chronic kidney disease with heart failure and stage 1 through stage 4 chronic kidney disease, or unspecified chronic kidney disease: Secondary | ICD-10-CM | POA: Insufficient documentation

## 2024-08-10 DIAGNOSIS — Z7401 Bed confinement status: Secondary | ICD-10-CM | POA: Diagnosis not present

## 2024-08-10 DIAGNOSIS — R0789 Other chest pain: Secondary | ICD-10-CM | POA: Diagnosis not present

## 2024-08-10 LAB — LIPASE, BLOOD: Lipase: 51 U/L (ref 11–51)

## 2024-08-10 LAB — CBC WITH DIFFERENTIAL/PLATELET
Abs Immature Granulocytes: 0.02 K/uL (ref 0.00–0.07)
Basophils Absolute: 0 K/uL (ref 0.0–0.1)
Basophils Relative: 0 %
Eosinophils Absolute: 0.2 K/uL (ref 0.0–0.5)
Eosinophils Relative: 2 %
HCT: 41.8 % (ref 39.0–52.0)
Hemoglobin: 13.5 g/dL (ref 13.0–17.0)
Immature Granulocytes: 0 %
Lymphocytes Relative: 19 %
Lymphs Abs: 1.2 K/uL (ref 0.7–4.0)
MCH: 29.3 pg (ref 26.0–34.0)
MCHC: 32.3 g/dL (ref 30.0–36.0)
MCV: 90.9 fL (ref 80.0–100.0)
Monocytes Absolute: 0.5 K/uL (ref 0.1–1.0)
Monocytes Relative: 7 %
Neutro Abs: 4.7 K/uL (ref 1.7–7.7)
Neutrophils Relative %: 72 %
Platelets: 179 K/uL (ref 150–400)
RBC: 4.6 MIL/uL (ref 4.22–5.81)
RDW: 14 % (ref 11.5–15.5)
WBC: 6.6 K/uL (ref 4.0–10.5)
nRBC: 0 % (ref 0.0–0.2)

## 2024-08-10 LAB — COMPREHENSIVE METABOLIC PANEL WITH GFR
ALT: 9 U/L (ref 0–44)
AST: 20 U/L (ref 15–41)
Albumin: 4.1 g/dL (ref 3.5–5.0)
Alkaline Phosphatase: 66 U/L (ref 38–126)
Anion gap: 10 (ref 5–15)
BUN: 23 mg/dL (ref 8–23)
CO2: 23 mmol/L (ref 22–32)
Calcium: 8.8 mg/dL — ABNORMAL LOW (ref 8.9–10.3)
Chloride: 105 mmol/L (ref 98–111)
Creatinine, Ser: 1.63 mg/dL — ABNORMAL HIGH (ref 0.61–1.24)
GFR, Estimated: 44 mL/min — ABNORMAL LOW (ref >60)
Glucose, Bld: 107 mg/dL — ABNORMAL HIGH (ref 70–99)
Potassium: 4.4 mmol/L (ref 3.5–5.1)
Sodium: 138 mmol/L (ref 135–145)
Total Bilirubin: 0.5 mg/dL (ref 0.0–1.2)
Total Protein: 6.9 g/dL (ref 6.5–8.1)

## 2024-08-10 LAB — CBC
HCT: 39.5 % (ref 39.0–52.0)
Hemoglobin: 12.8 g/dL — ABNORMAL LOW (ref 13.0–17.0)
MCH: 29.4 pg (ref 26.0–34.0)
MCHC: 32.4 g/dL (ref 30.0–36.0)
MCV: 90.6 fL (ref 80.0–100.0)
Platelets: 172 K/uL (ref 150–400)
RBC: 4.36 MIL/uL (ref 4.22–5.81)
RDW: 14 % (ref 11.5–15.5)
WBC: 6.4 K/uL (ref 4.0–10.5)
nRBC: 0 % (ref 0.0–0.2)

## 2024-08-10 LAB — CREATININE, SERUM
Creatinine, Ser: 1.53 mg/dL — ABNORMAL HIGH (ref 0.61–1.24)
GFR, Estimated: 47 mL/min — ABNORMAL LOW (ref >60)

## 2024-08-10 LAB — TROPONIN T, HIGH SENSITIVITY
Troponin T High Sensitivity: 72 ng/L — ABNORMAL HIGH (ref 0–19)
Troponin T High Sensitivity: 66 ng/L — ABNORMAL HIGH (ref 0–19)

## 2024-08-10 LAB — GLUCOSE, CAPILLARY: Glucose-Capillary: 106 mg/dL — ABNORMAL HIGH (ref 70–99)

## 2024-08-10 MED ORDER — AMLODIPINE BESYLATE-VALSARTAN 10-320 MG PO TABS: 1.0000 | ORAL_TABLET | Freq: Every day | ORAL | Status: DC

## 2024-08-10 MED ORDER — ROSUVASTATIN CALCIUM 5 MG PO TABS
10.0000 mg | ORAL_TABLET | Freq: Every day | ORAL | Status: DC
  Administered 2024-08-11 – 2024-08-12 (×2): 10 mg via ORAL
  Filled 2024-08-10 (×2): qty 2

## 2024-08-10 MED ORDER — EMPAGLIFLOZIN 10 MG PO TABS
10.0000 mg | ORAL_TABLET | Freq: Every day | ORAL | Status: DC
  Administered 2024-08-11 – 2024-08-12 (×2): 10 mg via ORAL
  Filled 2024-08-10 (×2): qty 1

## 2024-08-10 MED ORDER — AMLODIPINE BESYLATE 10 MG PO TABS
10.0000 mg | ORAL_TABLET | Freq: Every day | ORAL | Status: DC
  Administered 2024-08-11 – 2024-08-12 (×2): 10 mg via ORAL
  Filled 2024-08-10 (×2): qty 1

## 2024-08-10 MED ORDER — INSULIN ASPART 100 UNIT/ML IJ SOLN: 0.0000 [IU] | Freq: Three times a day (TID) | INTRAMUSCULAR | Status: DC

## 2024-08-10 MED ORDER — INSULIN ASPART 100 UNIT/ML IJ SOLN: 0.0000 [IU] | Freq: Every day | INTRAMUSCULAR | Status: DC

## 2024-08-10 MED ORDER — NITROGLYCERIN 0.4 MG SL SUBL
0.4000 mg | SUBLINGUAL_TABLET | SUBLINGUAL | Status: DC | PRN
  Filled 2024-08-10: qty 1

## 2024-08-10 MED ORDER — IRBESARTAN 300 MG PO TABS
300.0000 mg | ORAL_TABLET | Freq: Every day | ORAL | Status: DC
  Administered 2024-08-11 – 2024-08-12 (×2): 300 mg via ORAL
  Filled 2024-08-10 (×2): qty 1

## 2024-08-10 MED ORDER — ASPIRIN 81 MG PO CHEW
243.0000 mg | CHEWABLE_TABLET | Freq: Once | ORAL | Status: AC
  Administered 2024-08-10: 243 mg via ORAL
  Filled 2024-08-10: qty 3

## 2024-08-10 MED ORDER — ENOXAPARIN SODIUM 40 MG/0.4ML IJ SOSY
40.0000 mg | PREFILLED_SYRINGE | INTRAMUSCULAR | Status: DC
  Administered 2024-08-10 – 2024-08-11 (×2): 40 mg via SUBCUTANEOUS
  Filled 2024-08-10 (×2): qty 0.4

## 2024-08-10 MED ORDER — CLOPIDOGREL BISULFATE 75 MG PO TABS
75.0000 mg | ORAL_TABLET | Freq: Every day | ORAL | Status: DC
  Administered 2024-08-10 – 2024-08-12 (×3): 75 mg via ORAL
  Filled 2024-08-10 (×3): qty 1

## 2024-08-10 MED ORDER — METOPROLOL SUCCINATE ER 25 MG PO TB24
25.0000 mg | ORAL_TABLET | Freq: Every day | ORAL | Status: DC
  Administered 2024-08-10 – 2024-08-12 (×3): 25 mg via ORAL
  Filled 2024-08-10 (×3): qty 1

## 2024-08-10 MED ORDER — ALLOPURINOL 300 MG PO TABS
300.0000 mg | ORAL_TABLET | Freq: Every day | ORAL | Status: DC
  Administered 2024-08-11 – 2024-08-12 (×2): 300 mg via ORAL
  Filled 2024-08-10 (×2): qty 1

## 2024-08-10 NOTE — ED Triage Notes (Signed)
 Pt bib wheelchair, c/o CP, feeling of indigestion x 1 week, and RT arm pain x 3 days. Pt states I just want to get myself checked out. Denies shob. Rash noted to pts chest when pt changing

## 2024-08-10 NOTE — ED Notes (Signed)
 Patient is eating [employer] with some grape juice sent by family. SH, RN approved.

## 2024-08-10 NOTE — ED Notes (Signed)
 Called [facility] to transport the patient to [facility] rm[facility] 24

## 2024-08-10 NOTE — ED Notes (Signed)
 Pt en route to [facility] w/ [facility]

## 2024-08-10 NOTE — Plan of Care (Signed)
 Hospital Medicine Transfer Accept Note Patient Name/Age: Gregory [name] / 73 y.o. MRN: [id number] Admission Date: 08/10/2024  Once successfully transferred to the appropriate floor, TRH will assume care for the patient above.  A/P: 83M h/o prostate cancer, HTN, HLD, DM2, and gout p/w chest pain, feeling of indigestion x 1 week, and R arm pain x 3 days iso NSTEMI. EDP, who consulted Cards and requested [facility] admission, deferred heparin  gtt iso stable troponins. Will need troponin, and Cards eval on arrival. Low threshold to start hep gtt if cp recurs.   Marsha Ada, MD Attending Physician Division of [facility] August 10, 2024 12:21 PM

## 2024-08-10 NOTE — ED Provider Notes (Signed)
 [facility]PART[facility] AT [facility] Provider Note   CSN: [id number] Arrival date & time: 08/10/24  9188     Patient presents with: Chest Pain   Gregory [name] is a 73 y.o. male.   HPI    73 year old male with a history of coronary artery disease, chronic diastolic heart failure, diabetes, hyperlipidemia, CKD, who presents with concern for right arm pain and chest pain.  Mostly right arm pain started a few days ago Feels it going into chest too Aching pain Last night when went to sleep wasn't hurting, but took tylenol  arthritis, took vinegar and baby aspirin  Then woke up and was there  5/10 pain right now No shortness of breath No diaphoresis, nausea, vomiting or abd pain No cough or fever No leg pain or swelling Prostate cancer    Past Medical History:  Diagnosis Date   Arthritis    Cancer (HCC)    Coronary artery disease    Gout    Hyperlipidemia    Hypertension    Myocardial infarction (HCC) 05/2012   mild   Swelling of left lower extremity    LLE; happens often   Type II diabetes mellitus (HCC)      Prior to Admission medications   Medication Sig Start Date End Date Taking? Authorizing Provider  allopurinol  (ZYLOPRIM ) 300 MG tablet Take 1 tablet (300 mg total) by mouth daily. 07/24/23     amLODipine -valsartan  (EXFORGE ) 10-320 MG per tablet Take 1 tablet by mouth daily.    [provider]  clopidogrel  (PLAVIX ) 75 MG tablet TAKE 1 TABLET (75 MG TOTAL) BY MOUTH DAILY. 10/22/14   Dann Candyce RAMAN, MD  empagliflozin  (JARDIANCE ) 10 MG TABS tablet Take 1 tablet (10 mg total) by mouth daily before breakfast. 03/31/24   Thukkani, Arun K, MD  Evolocumab  (REPATHA  SURECLICK) 140 MG/ML SOAJ Inject 140 mg into the skin every 14 (fourteen) days. 03/31/24   Thukkani, Arun K, MD  furosemide  (LASIX ) 40 MG tablet TAKE ONE TABLET BY MOUTH EVERY MORNING. Please make overdue appt with Dr. Dann before anymore refills. Thank you 3rd and Final  Attempt Patient not taking: Reported on 03/31/2024 02/14/22   Dann Candyce RAMAN, MD  LANTUS  SOLOSTAR 100 UNIT/ML Solostar Pen Inject 15 Units into the skin in the morning. Patient taking differently: Inject 30 Units into the skin in the morning. 08/09/21   Krishnan, Gokul, MD  metFORMIN  (GLUCOPHAGE ) 1000 MG tablet Take 1,000 mg by mouth 2 (two) times daily with a meal.    [provider]  metoprolol  succinate (TOPROL  XL) 25 MG 24 hr tablet Take 1 tablet (25 mg total) by mouth daily. 03/31/24   Thukkani, Arun K, MD  nitroGLYCERIN  (NITROSTAT ) 0.4 MG SL tablet Place 1 tablet (0.4 mg total) under the tongue every 5 (five) minutes as needed for chest pain. 08/21/17   Dann Candyce RAMAN, MD  rosuvastatin  (CRESTOR ) 10 MG tablet Take 1 tablet (10 mg total) by mouth daily. Dose change 07/04/24 10/05/24  Maccia, Melissa D, RPH-CPP    Allergies: Patient has no known allergies.    Review of Systems  Updated Vital Signs BP 127/72 (BP Location: Right Arm)   Pulse 82   Temp 98.8 F (37.1 C) (Oral)   Resp (!) 22   Wt 106.6 kg   SpO2 95%   BMI 36.26 kg/m   Physical Exam Vitals and nursing note reviewed.  Constitutional:      General: He is not in acute distress.    Appearance:  He is well-developed. He is not diaphoretic.  HENT:     Head: Normocephalic and atraumatic.  Eyes:     Conjunctiva/sclera: Conjunctivae normal.  Cardiovascular:     Rate and Rhythm: Normal rate and regular rhythm.     Heart sounds: Normal heart sounds. No murmur heard.    No friction rub. No gallop.  Pulmonary:     Effort: Pulmonary effort is normal. No respiratory distress.     Breath sounds: Normal breath sounds. No wheezing or rales.  Abdominal:     General: There is no distension.     Palpations: Abdomen is soft.     Tenderness: There is no abdominal tenderness. There is no guarding.  Musculoskeletal:     Cervical back: Normal range of motion.  Skin:    General: Skin is warm and dry.  Neurological:      Mental Status: He is alert and oriented to person, place, and time.     (all labs ordered are listed, but only abnormal results are displayed) Labs Reviewed  COMPREHENSIVE METABOLIC PANEL WITH GFR - Abnormal; Notable for the following components:      Result Value   Glucose, Bld 107 (*)    Creatinine, Ser 1.63 (*)    Calcium  8.8 (*)    GFR, Estimated 44 (*)    All other components within normal limits  CBC - Abnormal; Notable for the following components:   Hemoglobin 12.8 (*)    All other components within normal limits  CREATININE, SERUM - Abnormal; Notable for the following components:   Creatinine, Ser 1.53 (*)    GFR, Estimated 47 (*)    All other components within normal limits  TROPONIN T, HIGH SENSITIVITY - Abnormal; Notable for the following components:   Troponin T High Sensitivity 72 (*)    All other components within normal limits  TROPONIN T, HIGH SENSITIVITY - Abnormal; Notable for the following components:   Troponin T High Sensitivity 66 (*)    All other components within normal limits  CBC WITH DIFFERENTIAL/PLATELET  LIPASE, BLOOD    EKG: EKG Interpretation Date/Time:  Sunday August 10 2024 08:19:21 EDT Ventricular Rate:  85 PR Interval:  196 QRS Duration:  105 QT Interval:  343 QTC Calculation: 408 R Axis:   17  Text Interpretation: Sinus rhythm Probable left atrial enlargement Left ventricular hypertrophy No significant change since last tracing Confirmed by Dreama Longs ([id number]) on 08/10/2024 9:59:20 AM  Radiology: [facility] Chest 2 View Result Date: 08/10/2024 EXAM: PA AND LATERAL (2) VIEW(S) XRAY OF THE CHEST 08/10/2024 08:42:36 AM COMPARISON: PA and lateral radiographs of the chest dated 09/12/2023. CLINICAL HISTORY: cp. cp FINDINGS: LUNGS AND PLEURA: No focal pulmonary opacity. No pulmonary edema. No pleural effusion. No pneumothorax. HEART AND MEDIASTINUM: No acute abnormality of the cardiac and mediastinal silhouettes. BONES AND SOFT TISSUES:  No acute osseous abnormality. IMPRESSION: 1. No acute cardiopulmonary pathology. Electronically signed by: Evalene Coho MD 08/10/2024 08:46 AM EDT RP Workstation: [id number]     Procedures   Medications Ordered in the ED  nitroGLYCERIN  (NITROSTAT ) SL tablet 0.4 mg (has no administration in time range)  enoxaparin  (LOVENOX ) injection 40 mg (40 mg Subcutaneous Given 08/10/24 1329)  clopidogrel  (PLAVIX ) tablet 75 mg (has no administration in time range)  metoprolol  succinate (TOPROL -XL) 24 hr tablet 25 mg (has no administration in time range)  aspirin  chewable tablet 243 mg (243 mg Oral Given 08/10/24 0951)  73 year old male with a history of coronary artery disease, chronic diastolic heart failure, diabetes, hyperlipidemia, CKD, who presents with concern for right arm pain and chest pain.  Differential diagnosis for chest pain includes pulmonary embolus, dissection, pneumothorax, pneumonia, ACS, myocarditis, pericarditis.    EKG was done and evaluate by me and showed no acute ST changes and no signs of pericarditis.   Chest x-ray was done and evaluated by me and radiology and showed no sign of pneumonia or pneumothorax.  Labs completed and personally eval and interpreted by me show no anemia, no leukocytosis, similar creatinine to prior, no clinically significant electrolyte abnormalities, no transaminitis or signs of pancreatitis.  His troponin is mildly elevated at 72.  We do not have any prior recent troponins other than elevation in 2022 when he was admitted for sepsis.    History and exam are not consistent with pulmonary embolus, aortic dissection, nor intra-abdominal etiology of symptoms.  Suspect chest pain and arm pain are secondary to cardiac etiology.  He had taken 1 aspirin  at home, he was given 3 additional aspirin  in the emergency department.  Discussed with cardiology Dr. Waddell. Cardiology will consult, will admit to hospitalist for  chest pain work up.      Final diagnoses:  Chest pain, unspecified type  Troponin level elevated    ED Discharge Orders     None          Dreama Longs, MD 08/10/24 1625

## 2024-08-10 NOTE — H&P (Signed)
 History and Physical  Gregory [name] FMW:[id number] DOB: [birth year] DOA: 08/10/2024  PCP: Rexanne Ingle, MD   Chief Complaint: Chest pain, arm pain   HPI: Gregory [name] is a 73 y.o. male with medical history significant for prostate cancer, HTN, HLD, MI/CAD s/p PCI, DM2, CKD 3A, and gout who presented to the [facility] for evaluation of chest pain and arm pain. Patient endorsed right arm and chest pain that started 4 to 5 days ago.  Reports he started having pain in his right arm and shoulders with radiation to his chest and back. The pain is described as aching and lasting about 30 minutes each time.  Patient reports taking Tylenol , vinegar and baby aspirin  last night. The pain reoccurred this morning so he presented to the ED for evaluation due to his history of MI.  He denies any associated nausea, vomiting, diaphoresis, abdominal pain, shortness of breath, dizziness, fevers or chills.  ED Course: Initial vitals show patient afebrile and normotensive. Initial labs significant for creatinine 1.63, troponin 72-66 and unremarkable CBC. EKG shows sinus tach with LAE, LVH and nonspecific ST changes. CXR shows no active disease. Pt received aspirin  243 mg x 1. Cardiology was consulted for evaluation.  Patient was admitted to [facility] service and transferred to [facility].  Review of Systems: Please see HPI for pertinent positives and negatives. A complete 10 system review of systems are otherwise negative.  Past Medical History:  Diagnosis Date   Arthritis    Cancer (HCC)    Coronary artery disease    Gout    Hyperlipidemia    Hypertension    Myocardial infarction (HCC) 05/2012   mild   Swelling of left lower extremity    LLE; happens often   Type II diabetes mellitus (HCC)    Past Surgical History:  Procedure Laterality Date   BACK SURGERY     CORONARY ANGIOPLASTY WITH STENT PLACEMENT  06/05/2012   2; total of 2   LEFT HEART CATHETERIZATION WITH CORONARY ANGIOGRAM N/A  06/05/2012   Procedure: LEFT HEART CATHETERIZATION WITH CORONARY ANGIOGRAM;  Surgeon: Candyce GORMAN Reek, MD;  Location: [facility] CATH LAB;  Service: Cardiovascular;  Laterality: N/A;  possible PCI   LUMBAR DISC SURGERY  1990's   LYMPHADENECTOMY Bilateral 02/25/2014   Procedure: LYMPHADENECTOMY-PELVIC LYMPH NODE DISSECTION;  Surgeon: Toribio Neysa Repine, MD;  Location: [facility] ORS;  Service: Urology;  Laterality: Bilateral;   PROSTATE SURGERY     ROBOT ASSISTED LAPAROSCOPIC RADICAL PROSTATECTOMY N/A 02/25/2014   Procedure: ROBOTIC ASSISTED LAPAROSCOPIC RADICAL  PROSTATECTOMY,  UMBILICAL HERNIA REPAIR;  Surgeon: Toribio Neysa Repine, MD;  Location: [facility] ORS;  Service: Urology;  Laterality: N/A;   Social History:  reports that he quit smoking about 30 years ago. His smoking use included cigarettes. He started smoking about 60 years ago. He has a 60 pack-year smoking history. He has never used smokeless tobacco. He reports current alcohol use. He reports that he does not use drugs.  No Known Allergies  Family History  Problem Relation Age of Onset   Breast cancer Mother    Asthma Father    Parkinson's disease Father      Prior to Admission medications   Medication Sig Start Date End Date Taking? Authorizing Provider  allopurinol  (ZYLOPRIM ) 300 MG tablet Take 1 tablet (300 mg total) by mouth daily. 07/24/23     amLODipine -valsartan  (EXFORGE ) 10-320 MG per tablet Take 1 tablet by mouth daily.    [provider]  clopidogrel  (PLAVIX ) 75 MG  tablet TAKE 1 TABLET (75 MG TOTAL) BY MOUTH DAILY. 10/22/14   Dann Candyce RAMAN, MD  empagliflozin  (JARDIANCE ) 10 MG TABS tablet Take 1 tablet (10 mg total) by mouth daily before breakfast. 03/31/24   Thukkani, Arun K, MD  Evolocumab  (REPATHA  SURECLICK) 140 MG/ML SOAJ Inject 140 mg into the skin every 14 (fourteen) days. 03/31/24   Thukkani, Arun K, MD  furosemide  (LASIX ) 40 MG tablet TAKE ONE TABLET BY MOUTH EVERY MORNING. Please make overdue appt with Dr.  Dann before anymore refills. Thank you 3rd and Final Attempt Patient not taking: Reported on 03/31/2024 02/14/22   Dann Candyce RAMAN, MD  LANTUS  SOLOSTAR 100 UNIT/ML Solostar Pen Inject 15 Units into the skin in the morning. Patient taking differently: Inject 30 Units into the skin in the morning. 08/09/21   Krishnan, Gokul, MD  metFORMIN  (GLUCOPHAGE ) 1000 MG tablet Take 1,000 mg by mouth 2 (two) times daily with a meal.    [provider]  metoprolol  succinate (TOPROL  XL) 25 MG 24 hr tablet Take 1 tablet (25 mg total) by mouth daily. 03/31/24   Thukkani, Arun K, MD  nitroGLYCERIN  (NITROSTAT ) 0.4 MG SL tablet Place 1 tablet (0.4 mg total) under the tongue every 5 (five) minutes as needed for chest pain. 08/21/17   Dann Candyce RAMAN, MD  rosuvastatin  (CRESTOR ) 10 MG tablet Take 1 tablet (10 mg total) by mouth daily. Dose change 07/04/24 10/05/24  Maccia, Melissa D, RPH-CPP    Physical Exam: BP 124/72   Pulse 73   Temp 98.8 F (37.1 C) (Oral)   Resp (!) 24   Wt 106.6 kg   SpO2 94%   BMI 36.26 kg/m  General: Pleasant, well-appearing elderly man laying in bed. No acute distress. HEENT: [place]/AT. Anicteric sclera CV: RRR. No murmurs, rubs, or gallops.  Pulmonary: Lungs CTAB. Normal effort. No wheezing or rales. Abdominal: Soft, nontender, nondistended. Normal bowel sounds. Extremities: Palpable radial and DP pulses. Normal ROM.  Chronic left lower leg and ankle swelling. Skin: Warm and dry. No obvious rash or lesions. Neuro: A&Ox3. Moves all extremities. Normal sensation to light touch. No focal deficit. Psych: Normal mood and affect          Labs on Admission:  Basic Metabolic Panel: Recent Labs  Lab 08/10/24 0847 08/10/24 1326  NA 138  --   K 4.4  --   CL 105  --   CO2 23  --   GLUCOSE 107*  --   BUN 23  --   CREATININE 1.63* 1.53*  CALCIUM  8.8*  --    Liver Function Tests: Recent Labs  Lab 08/10/24 0847  AST 20  ALT 9  ALKPHOS 66  BILITOT 0.5  PROT 6.9   ALBUMIN 4.1   Recent Labs  Lab 08/10/24 0847  LIPASE 51   No results for input(s): AMMONIA in the last 168 hours. CBC: Recent Labs  Lab 08/10/24 0847 08/10/24 1326  WBC 6.6 6.4  NEUTROABS 4.7  --   HGB 13.5 12.8*  HCT 41.8 39.5  MCV 90.9 90.6  PLT 179 172   Cardiac Enzymes: No results for input(s): CKTOTAL, CKMB, CKMBINDEX, TROPONINI in the last 168 hours. BNP (last 3 results) No results for input(s): BNP in the last 8760 hours.  ProBNP (last 3 results) No results for input(s): PROBNP in the last 8760 hours.  CBG: No results for input(s): GLUCAP in the last 168 hours.  Radiological Exams on Admission: DG Chest 2 View Result Date: 08/10/2024 EXAM: PA  AND LATERAL (2) VIEW(S) XRAY OF THE CHEST 08/10/2024 08:42:36 AM COMPARISON: PA and lateral radiographs of the chest dated 09/12/2023. CLINICAL HISTORY: cp. cp FINDINGS: LUNGS AND PLEURA: No focal pulmonary opacity. No pulmonary edema. No pleural effusion. No pneumothorax. HEART AND MEDIASTINUM: No acute abnormality of the cardiac and mediastinal silhouettes. BONES AND SOFT TISSUES: No acute osseous abnormality. IMPRESSION: 1. No acute cardiopulmonary pathology. Electronically signed by: Evalene Coho MD 08/10/2024 08:46 AM EDT RP Workstation: [id number]   Assessment/Plan Gregory [name] is a 73 y.o. male with medical history significant for prostate cancer, HTN, HLD, MI/CAD, DM2, CKD 3A, and gout who presented to [facility] for evaluation of chest pain and arm pain   # Chest pain # Concern for NSTEMI - Patient with a history of MI and CAD s/p PCI 2013 presented with intermittent right arm and chest pain over the last few days - Troponin peaked at 72, EKG without ischemic changes - Patient currently chest pain-free, presentation concerning for possible coronary vasospasm - Cardiology consulted, heparin  drip deferred due to stable troponins patient being chest pain-free - Patient will likely need  some kind of ischemic evaluation, will defer this to cardiology - Continue Toprol  XL and Plavix  - As needed nitroglycerin  for chest pain - Telemetry  # HTN - BP stable with SBP in the 110-140s - Continue Toprol  XL, amlodipine  and valsartan   # T2DM - Blood glucose of 107 on admission - SSI with meals, CBG monitoring - Resume appropriate dose of insulin  after completion of full med rec - Continue Jardiance  - Carb modified diet - F/u repeat A1c  # Chronic diastolic heart failure - Last TTE in 01/2015 showed EF 55-60%, moderate LVH and G1DD - Patient euvolemic on exam - Continue Toprol  XL and Jardiance   # CKD 3A - Creatinine of 1.63 relatively unchanged compared to baseline of 1.4-1.6 - Trend renal function and avoid nephrotoxic agents  # Hx of MI/CAD - Status post coronary angiography with placement of 2 stents in 2013 - Continue Plavix  and rosuvastatin   # HLD - Continue rosuvastatin  - Follow-up repeat lipid panel  # Gout - Continue allopurinol    DVT prophylaxis: Lovenox      Code Status: Full Code  Consults called: Cardiology  Family Communication: No family at bedside  Severity of Illness: The appropriate patient status for this patient is OBSERVATION. Observation status is judged to be reasonable and necessary in order to provide the required intensity of service to ensure the patient's safety. The patient's presenting symptoms, physical exam findings, and initial radiographic and laboratory data in the context of their medical condition is felt to place them at decreased risk for further clinical deterioration. Furthermore, it is anticipated that the patient will be medically stable for discharge from the hospital within 2 midnights of admission.   Level of care: Telemetry Cardiac    Lou Claretta HERO, MD 08/10/2024, 8:14 PM [facility] Pager: [phone] Isaiah 41:10   If 7PM-7AM, please contact night-coverage [email or web address] Password [email or web address]

## 2024-08-11 ENCOUNTER — Other Ambulatory Visit: Payer: Self-pay

## 2024-08-11 ENCOUNTER — Observation Stay (HOSPITAL_COMMUNITY)

## 2024-08-11 DIAGNOSIS — R079 Chest pain, unspecified: Secondary | ICD-10-CM

## 2024-08-11 DIAGNOSIS — I2510 Atherosclerotic heart disease of native coronary artery without angina pectoris: Principal | ICD-10-CM

## 2024-08-11 DIAGNOSIS — E785 Hyperlipidemia, unspecified: Secondary | ICD-10-CM

## 2024-08-11 DIAGNOSIS — N1831 Chronic kidney disease, stage 3a: Secondary | ICD-10-CM | POA: Diagnosis not present

## 2024-08-11 DIAGNOSIS — I5032 Chronic diastolic (congestive) heart failure: Secondary | ICD-10-CM | POA: Diagnosis not present

## 2024-08-11 DIAGNOSIS — M19011 Primary osteoarthritis, right shoulder: Secondary | ICD-10-CM | POA: Diagnosis not present

## 2024-08-11 DIAGNOSIS — E1169 Type 2 diabetes mellitus with other specified complication: Secondary | ICD-10-CM

## 2024-08-11 DIAGNOSIS — I10 Essential (primary) hypertension: Secondary | ICD-10-CM | POA: Diagnosis not present

## 2024-08-11 LAB — LIPID PANEL
Cholesterol: 50 mg/dL (ref 0–200)
HDL: 28 mg/dL — ABNORMAL LOW (ref >40)
Total CHOL/HDL Ratio: 1.8 ratio
Triglycerides: 124 mg/dL (ref <150)
VLDL: 25 mg/dL (ref 0–40)

## 2024-08-11 LAB — BASIC METABOLIC PANEL WITH GFR
Anion gap: 8 (ref 5–15)
BUN: 25 mg/dL — ABNORMAL HIGH (ref 8–23)
CO2: 23 mmol/L (ref 22–32)
Calcium: 8.3 mg/dL — ABNORMAL LOW (ref 8.9–10.3)
Chloride: 107 mmol/L (ref 98–111)
Creatinine, Ser: 1.52 mg/dL — ABNORMAL HIGH (ref 0.61–1.24)
GFR, Estimated: 48 mL/min — ABNORMAL LOW (ref >60)
Glucose, Bld: 124 mg/dL — ABNORMAL HIGH (ref 70–99)
Potassium: 4.3 mmol/L (ref 3.5–5.1)
Sodium: 138 mmol/L (ref 135–145)

## 2024-08-11 LAB — GLUCOSE, CAPILLARY
Glucose-Capillary: 104 mg/dL — ABNORMAL HIGH (ref 70–99)
Glucose-Capillary: 106 mg/dL — ABNORMAL HIGH (ref 70–99)
Glucose-Capillary: 106 mg/dL — ABNORMAL HIGH (ref 70–99)
Glucose-Capillary: 102 mg/dL — ABNORMAL HIGH (ref 70–99)

## 2024-08-11 LAB — CBC
HCT: 41.1 % (ref 39.0–52.0)
Hemoglobin: 13.1 g/dL (ref 13.0–17.0)
MCH: 29.1 pg (ref 26.0–34.0)
MCHC: 31.9 g/dL (ref 30.0–36.0)
MCV: 91.3 fL (ref 80.0–100.0)
Platelets: 194 K/uL (ref 150–400)
RBC: 4.5 MIL/uL (ref 4.22–5.81)
RDW: 13.9 % (ref 11.5–15.5)
WBC: 7 K/uL (ref 4.0–10.5)
nRBC: 0 % (ref 0.0–0.2)

## 2024-08-11 LAB — ECHOCARDIOGRAM COMPLETE
AR max vel: 2.51 cm2
AV Peak grad: 7 mmHg
Ao pk vel: 1.32 m/s
Area-P 1/2: 2.8 cm2
Height: 67.5 in
S' Lateral: 2.75 cm
Weight: 3506.2 [oz_av]

## 2024-08-11 LAB — HEMOGLOBIN A1C
Hgb A1c MFr Bld: 6.2 % — ABNORMAL HIGH (ref 4.8–5.6)
Mean Plasma Glucose: 131.24 mg/dL

## 2024-08-11 MED ORDER — DICLOFENAC SODIUM 1 % EX GEL
2.0000 g | Freq: Four times a day (QID) | CUTANEOUS | Status: DC
  Administered 2024-08-11 – 2024-08-12 (×2): 2 g via TOPICAL
  Filled 2024-08-11 (×2): qty 100

## 2024-08-11 NOTE — Assessment & Plan Note (Signed)
 No signs of acute exacerbation Plan to continue medical therapy with metoprolol  succinate, ARB and SGLT 2 inh

## 2024-08-11 NOTE — Assessment & Plan Note (Signed)
 Chest pain, with no frank angina Possible related to right shoulder arthritis EKG with no acute ischemic changes Mild elevation of high sensitive troponin  Plan to check echocardiogram, if no wall motion abnormalities, possible discharge home and follow as outpatient for possible stress test.   Continue clopidogrel  and statin therapy Blood pressure control

## 2024-08-11 NOTE — Plan of Care (Signed)
   Problem: Clinical Measurements: Goal: Ability to maintain clinical measurements within normal limits will improve Outcome: Progressing

## 2024-08-11 NOTE — Plan of Care (Signed)
 ?  Problem: Coping: ?Goal: Level of anxiety will decrease ?Outcome: Progressing ?  ?Problem: Safety: ?Goal: Ability to remain free from injury will improve ?Outcome: Progressing ?  ?

## 2024-08-11 NOTE — Assessment & Plan Note (Signed)
 Continue pain control with topical diclofenac Follow up as outpatient with orthopedics.

## 2024-08-11 NOTE — Assessment & Plan Note (Signed)
 Continue statin and SGLT 2 inh  Continue metformin  and basal insulin ,  Continue with evolucomab

## 2024-08-11 NOTE — Assessment & Plan Note (Signed)
 Continue blood pressure control with irbesartan , metoprolol , amlodipine ,

## 2024-08-11 NOTE — Care Management Obs Status (Signed)
 MEDICARE OBSERVATION STATUS NOTIFICATION   Patient Details  Name: Gregory [name] MRN: [id number] Date of Birth: 13-Jul-1951   Medicare Observation Status Notification Given:  Yes    [name] 08/11/2024, 8:58 AM

## 2024-08-11 NOTE — Assessment & Plan Note (Signed)
 Renal function with serum cr at 1.52 with K at 4.3 and serum bicarbonate at 23 Na 138  Continue close follow up renal function and electrolytes,

## 2024-08-11 NOTE — Plan of Care (Signed)
  Problem: Education: Goal: Knowledge of General Education information will improve Description: Including pain rating scale, medication(s)/side effects and non-pharmacologic comfort measures Outcome: Adequate for Discharge   Problem: Health Behavior/Discharge Planning: Goal: Ability to manage health-related needs will improve Outcome: Adequate for Discharge   Problem: Clinical Measurements: Goal: Ability to maintain clinical measurements within normal limits will improve 08/11/2024 1404 by Gail Cathryne SAILOR, RN Outcome: Adequate for Discharge 08/11/2024 [id number] by Gail Cathryne SAILOR, RN Outcome: Progressing Goal: Will remain free from infection Outcome: Adequate for Discharge Goal: Diagnostic test results will improve Outcome: Adequate for Discharge Goal: Respiratory complications will improve Outcome: Adequate for Discharge Goal: Cardiovascular complication will be avoided Outcome: Adequate for Discharge   Problem: Activity: Goal: Risk for activity intolerance will decrease Outcome: Adequate for Discharge   Problem: Nutrition: Goal: Adequate nutrition will be maintained Outcome: Adequate for Discharge   Problem: Coping: Goal: Level of anxiety will decrease Outcome: Adequate for Discharge   Problem: Elimination: Goal: Will not experience complications related to bowel motility Outcome: Adequate for Discharge Goal: Will not experience complications related to urinary retention Outcome: Adequate for Discharge   Problem: Pain Managment: Goal: General experience of comfort will improve and/or be controlled Outcome: Adequate for Discharge   Problem: Safety: Goal: Ability to remain free from injury will improve Outcome: Adequate for Discharge   Problem: Skin Integrity: Goal: Risk for impaired skin integrity will decrease Outcome: Adequate for Discharge   Problem: Education: Goal: Ability to describe self-care measures that may prevent or decrease complications (Diabetes  Survival Skills Education) will improve Outcome: Adequate for Discharge Goal: Individualized Educational Video(s) Outcome: Adequate for Discharge   Problem: Coping: Goal: Ability to adjust to condition or change in health will improve Outcome: Adequate for Discharge   Problem: Fluid Volume: Goal: Ability to maintain a balanced intake and output will improve Outcome: Adequate for Discharge   Problem: Health Behavior/Discharge Planning: Goal: Ability to identify and utilize available resources and services will improve Outcome: Adequate for Discharge Goal: Ability to manage health-related needs will improve Outcome: Adequate for Discharge   Problem: Metabolic: Goal: Ability to maintain appropriate glucose levels will improve Outcome: Adequate for Discharge   Problem: Nutritional: Goal: Maintenance of adequate nutrition will improve Outcome: Adequate for Discharge Goal: Progress toward achieving an optimal weight will improve Outcome: Adequate for Discharge   Problem: Skin Integrity: Goal: Risk for impaired skin integrity will decrease Outcome: Adequate for Discharge   Problem: Tissue Perfusion: Goal: Adequacy of tissue perfusion will improve Outcome: Adequate for Discharge

## 2024-08-11 NOTE — Progress Notes (Addendum)
 Progress Note   Patient: Gregory [name] FMW:[id number] DOB: 1951/03/02 DOA: 08/10/2024     0 DOS: the patient was seen and examined on 08/11/2024   Brief hospital course: [name] was admitted to the hospital with the working diagnosis of chest pain.   73 yo male with the past medical history of hypertension, prostate cancer, hypertension, hyperlipidemia, coronary artery disease, T2DM and CKD who presented with chest pain.  Reported 4 to 5 days of right arm and shoulders pain radiated to his chest. Intermittent pain, lasting about 30 minutes. Because of persistent symptoms he came to the hospital for further evaluation.  On his initial physical examination his blood pressure was 124/72, HR 73, RR 24 and 02 saturation 94% Lungs with no wheezing or rhonchi, heart with S1 and S2 present and regular, abdomen with no distention and no lower extremity edema.   Na 138, K 4.4 Cl 105 bicarbonate 23 glucose 107 bun 23 cr 1.63  AST 20 ALT 9  High sensitive troponin 72 and 66 Wbc 6.6 hgb 13.5 plt 179  Chest radiograph with mild cardiomegaly, with no effusions or infiltrates.  EKG 85 bpm, normal axis, normal intervals, qtc 408, sinus rhythm with no significant ST segment, negative T wave lead I and aVL (Old changes)    Assessment and Plan: * CAD (coronary artery disease) Chest pain, with no frank angina Possible related to right shoulder arthritis EKG with no acute ischemic changes Mild elevation of high sensitive troponin  Plan to check echocardiogram, if no wall motion abnormalities, possible discharge home and follow as outpatient for possible stress test.   Continue clopidogrel  and statin therapy Blood pressure control  Essential hypertension Continue blood pressure control with irbesartan , metoprolol , amlodipine ,   Chronic diastolic CHF (congestive heart failure) (HCC) No signs of acute exacerbation Plan to continue medical therapy with metoprolol  succinate, ARB and SGLT 2  inh  CKD stage 3a, GFR 45-59 ml/min (HCC) Renal function with serum cr at 1.52 with K at 4.3 and serum bicarbonate at 23 Na 138  Continue close follow up renal function and electrolytes,   Type 2 diabetes mellitus with hyperlipidemia (HCC) Continue glucose cover and monitoring with insulin  sliding scale Continue statin and SGLT 2 inh   Localized osteoarthritis of right shoulder Continue pain control with topical diclofenac  Obesity, unspecified Calculated BMI is 33.8 consistent with obesity class 1        Subjective: Patient is having right shoulder pain, worse with movement, no dyspnea, no PND, positive chronic left lower extremity edema   Physical Exam: Vitals:   08/10/24 2300 08/11/24 0500 08/11/24 0725 08/11/24 1139  BP: 127/83 131/69 134/75   Pulse: 82 (!) 59 61 70  Resp: 18 19 17 14   Temp: 98 F (36.7 C) 97.7 F (36.5 C) 97.8 F (36.6 C)   TempSrc: Oral Oral Oral Oral  SpO2: 97% 98% 91% 97%  Weight:  99.4 kg    Height:       Neurology awake and alert ENT with mild pallor Cardiovascular with S1 and S2 present and regular with no gallops, rubs or murmurs Respiratory with no rales or wheezing, no rhonchi Abdomen with no distention No right lower extremity edema, left lower extremity edema trace   Data Reviewed:    Family Communication: no family at the bedside   Disposition: Status is: Observation The patient remains OBS appropriate and will d/c before 2 midnights.  Planned Discharge Destination: Home     Author: Gaylynn Seiple Daniel Chrystian Cupples,  MD 08/11/2024 4:00 PM  For on call review [email or web address].

## 2024-08-11 NOTE — Plan of Care (Signed)
  Problem: Education: Goal: Knowledge of General Education information will improve Description: Including pain rating scale, medication(s)/side effects and non-pharmacologic comfort measures Outcome: Progressing   Problem: Clinical Measurements: Goal: Diagnostic test results will improve Outcome: Progressing Goal: Cardiovascular complication will be avoided Outcome: Progressing   Problem: Activity: Goal: Risk for activity intolerance will decrease Outcome: Progressing   

## 2024-08-11 NOTE — Assessment & Plan Note (Signed)
 Calculated BMI is 33.8 consistent with obesity class 1

## 2024-08-11 NOTE — Hospital Course (Addendum)
 Mr. Felipe was admitted to the hospital with the working diagnosis of chest pain.   73 yo male with the past medical history of hypertension, prostate cancer, hypertension, hyperlipidemia, coronary artery disease, T2DM and CKD who presented with chest pain.  Reported 4 to 5 days of right arm and shoulders pain radiated to his chest. Intermittent pain, lasting about 30 minutes. Because of persistent symptoms he came to the hospital for further evaluation.  On his initial physical examination his blood pressure was 124/72, HR 73, RR 24 and 02 saturation 94% Lungs with no wheezing or rhonchi, heart with S1 and S2 present and regular, abdomen with no distention and no lower extremity edema.   Na 138, K 4.4 Cl 105 bicarbonate 23 glucose 107 bun 23 cr 1.63  AST 20 ALT 9  High sensitive troponin 72 and 66 Wbc 6.6 hgb 13.5 plt 179  Chest radiograph with mild cardiomegaly, with no effusions or infiltrates.  EKG 85 bpm, normal axis, normal intervals, qtc 408, sinus rhythm with no significant ST segment, negative T wave lead I and aVL (Old changes)   Echocardiogram with no wall motion abnormalities and preserved, LV systolic function.  Plan to continue arthritis management to his right shoulder and follow up as outpatient.

## 2024-08-11 NOTE — Progress Notes (Signed)
 Echocardiogram 2D Echocardiogram has been performed.  Gregory [name] 08/11/2024, 4:09 PM

## 2024-08-12 DIAGNOSIS — I2510 Atherosclerotic heart disease of native coronary artery without angina pectoris: Secondary | ICD-10-CM | POA: Diagnosis not present

## 2024-08-12 DIAGNOSIS — I5032 Chronic diastolic (congestive) heart failure: Secondary | ICD-10-CM | POA: Diagnosis not present

## 2024-08-12 DIAGNOSIS — M19011 Primary osteoarthritis, right shoulder: Secondary | ICD-10-CM | POA: Diagnosis not present

## 2024-08-12 DIAGNOSIS — E1169 Type 2 diabetes mellitus with other specified complication: Secondary | ICD-10-CM | POA: Diagnosis not present

## 2024-08-12 DIAGNOSIS — I10 Essential (primary) hypertension: Secondary | ICD-10-CM | POA: Diagnosis not present

## 2024-08-12 DIAGNOSIS — E785 Hyperlipidemia, unspecified: Secondary | ICD-10-CM | POA: Diagnosis not present

## 2024-08-12 DIAGNOSIS — N1831 Chronic kidney disease, stage 3a: Secondary | ICD-10-CM | POA: Diagnosis not present

## 2024-08-12 LAB — BASIC METABOLIC PANEL WITH GFR
Anion gap: 6 (ref 5–15)
BUN: 25 mg/dL — ABNORMAL HIGH (ref 8–23)
CO2: 25 mmol/L (ref 22–32)
Calcium: 8.2 mg/dL — ABNORMAL LOW (ref 8.9–10.3)
Chloride: 106 mmol/L (ref 98–111)
Creatinine, Ser: 1.79 mg/dL — ABNORMAL HIGH (ref 0.61–1.24)
GFR, Estimated: 39 mL/min — ABNORMAL LOW (ref >60)
Glucose, Bld: 124 mg/dL — ABNORMAL HIGH (ref 70–99)
Potassium: 4.6 mmol/L (ref 3.5–5.1)
Sodium: 137 mmol/L (ref 135–145)

## 2024-08-12 LAB — GLUCOSE, CAPILLARY
Glucose-Capillary: 112 mg/dL — ABNORMAL HIGH (ref 70–99)
Glucose-Capillary: 101 mg/dL — ABNORMAL HIGH (ref 70–99)

## 2024-08-12 MED ORDER — DICLOFENAC SODIUM 1 % EX GEL: 2.0000 g | Freq: Four times a day (QID) | CUTANEOUS | 0 refills | Status: AC

## 2024-08-12 NOTE — TOC Transition Note (Signed)
 Transition of Care [facility]) - Discharge Note   Patient Details  Name: Gregory [name] MRN: [id number] Date of Birth: [birth year]  Transition of Care [facility]) CM/SW Contact:  Waddell Barnie Rama, RN Phone Number: 08/12/2024, 12:05 PM   Clinical Narrative:    For dc today, he has transportation, he has no needs         Patient Goals and CMS Choice            Discharge Placement                       Discharge Plan and Services Additional resources added to the After Visit Summary for                                       Social Drivers of Health (SDOH) Interventions SDOH Screenings   Food Insecurity: No Food Insecurity (08/11/2024)  Housing: Low Risk  (08/11/2024)  Transportation Needs: No Transportation Needs (08/11/2024)  Utilities: Not At Risk (08/10/2024)  Social Connections: Moderately Integrated (08/11/2024)  Tobacco Use: Medium Risk (08/10/2024)     Readmission Risk Interventions     No data to display

## 2024-08-12 NOTE — TOC CM/SW Note (Signed)
 Transition of Care [facility]) - Inpatient Brief Assessment   Patient Details  Name: Gregory [name] MRN: [id number] Date of Birth: 1951/10/16  Transition of Care [facility]) CM/SW Contact:    Waddell Barnie Rama, RN Phone Number: 08/12/2024, 12:03 PM   Clinical Narrative: From home alone, indep has PCP and insurance on file, states has no HH services in place at this time or DME at home.  States family member (DIL ) will transport them home at [employer] and family is support system, states gets medications from [employer].   Pta self ambulatory.   There are no ICM needs identified  at this time.  Please place consult for ICM  needs.     Transition of Care Asessment: Insurance and Status: Insurance coverage has been reviewed Patient has primary care physician: Yes Home environment has been reviewed: home alone Prior level of function:: indep Prior/Current Home Services: No current home services Social Drivers of Health Review: SDOH reviewed no interventions necessary Readmission risk has been reviewed: Yes Transition of care needs: no transition of care needs at this time

## 2024-08-12 NOTE — Discharge Summary (Signed)
 Physician Discharge Summary   Patient: Gregory [name] MRN: [id number] DOB: 1951-02-16  Admit date:     08/10/2024  Discharge date: 08/12/24  Discharge Physician: Elidia Sieving Leveta Wahab   PCP: Rexanne Ingle, MD   Recommendations at discharge:    Patient ruled out for acute coronary syndrome. Continue outpatient follow up for right shoulder arthritis  Follow up with Dr Rexanne in 7 to 10 days Follow up with Cardiology as scheduled.   I spoke with patient's daughter over, we talked in detail about patient's condition, plan of care and prognosis and all questions were addressed.  Discharge Diagnoses: Principal Problem:   CAD (coronary artery disease) Active Problems:   Essential hypertension   CKD stage 3a, GFR 45-59 ml/min (HCC)   Chronic diastolic CHF (congestive heart failure) (HCC)   Type 2 diabetes mellitus with hyperlipidemia (HCC)   Localized osteoarthritis of right shoulder   Obesity, unspecified  Resolved Problems:   * No resolved hospital problems. [facility] Course: Mr. [name] was admitted to the hospital with the working diagnosis of chest pain.   73 yo male with the past medical history of hypertension, prostate cancer, hypertension, hyperlipidemia, coronary artery disease, T2DM and CKD who presented with chest pain.  Reported 4 to 5 days of right arm and shoulders pain radiated to his chest. Intermittent pain, lasting about 30 minutes. Because of persistent symptoms he came to the hospital for further evaluation.  On his initial physical examination his blood pressure was 124/72, HR 73, RR 24 and 02 saturation 94% Lungs with no wheezing or rhonchi, heart with S1 and S2 present and regular, abdomen with no distention and no lower extremity edema.   Na 138, K 4.4 Cl 105 bicarbonate 23 glucose 107 bun 23 cr 1.63  AST 20 ALT 9  High sensitive troponin 72 and 66 Wbc 6.6 hgb 13.5 plt 179  Chest radiograph with mild cardiomegaly, with no effusions or  infiltrates.  EKG 85 bpm, normal axis, normal intervals, qtc 408, sinus rhythm with no significant ST segment, negative T wave lead I and aVL (Old changes)   Echocardiogram with no wall motion abnormalities and preserved, LV systolic function.  Plan to continue arthritis management to his right shoulder and follow up as outpatient.   Assessment and Plan: * CAD (coronary artery disease) Chest pain, with no frank angina Possible related to right shoulder arthritis EKG with no acute ischemic changes Mild elevation of high sensitive troponin  Echocardiogram with preserved LV systolic function. EF 55 to 60%, mild LVH, grade I diastolic dysfunction with impaired relaxation, RVSP 19,0 mmHg no valvular abnormalities.   Continue clopidogrel  and statin therapy Blood pressure control  Old records personally reviewed, patient had acute coronary syndrome in 2013.  Follow up with cardiology   Essential hypertension Continue blood pressure control with irbesartan , metoprolol , amlodipine ,   Chronic diastolic CHF (congestive heart failure) (HCC) No signs of acute exacerbation Plan to continue medical therapy with metoprolol  succinate, ARB and SGLT 2 inh  CKD stage 3a, GFR 45-59 ml/min (HCC) Baseline serum cr 1,5 to 1,6,  At the time of discharge cr 1,79 with serum bicarbonate at 25  Na 137 and K 4.6   Continue blood pressure control and follow up renal function as outpatient,   Type 2 diabetes mellitus with hyperlipidemia (HCC) Continue statin and SGLT 2 inh  Continue metformin  and basal insulin ,  Continue with evolucomab  Localized osteoarthritis of right shoulder Continue pain control with topical diclofenac Follow up as  outpatient with orthopedics.   Obesity, unspecified Calculated BMI is 33.8 consistent with obesity class 1       Consultants: none  Procedures performed: none   Disposition: Home Diet recommendation:  Cardiac diet DISCHARGE MEDICATION: Allergies as of  08/12/2024   No Known Allergies      Medication List     TAKE these medications    allopurinol  300 MG tablet Commonly known as: ZYLOPRIM  Take 1 tablet (300 mg total) by mouth daily.   amLODipine -valsartan  10-320 MG tablet Commonly known as: EXFORGE  Take 1 tablet by mouth daily.   clopidogrel  75 MG tablet Commonly known as: PLAVIX  TAKE 1 TABLET (75 MG TOTAL) BY MOUTH DAILY.   diclofenac Sodium 1 % Gel Commonly known as: VOLTAREN Apply 2 g topically 4 (four) times daily.   Jardiance  10 MG Tabs tablet Generic drug: empagliflozin  Take 1 tablet (10 mg total) by mouth daily before breakfast.   Lantus  SoloStar 100 UNIT/ML Solostar Pen Generic drug: insulin  glargine Inject 15 Units into the skin in the morning. What changed: how much to take   metFORMIN  1000 MG tablet Commonly known as: GLUCOPHAGE  Take 1,000 mg by mouth 2 (two) times daily with a meal.   metoprolol  succinate 25 MG 24 hr tablet Commonly known as: Toprol  XL Take 1 tablet (25 mg total) by mouth daily.   nitroGLYCERIN  0.4 MG SL tablet Commonly known as: NITROSTAT  Place 1 tablet (0.4 mg total) under the tongue every 5 (five) minutes as needed for chest pain.   Repatha  SureClick 140 MG/ML Soaj Generic drug: Evolocumab  Inject 140 mg into the skin every 14 (fourteen) days.   rosuvastatin  10 MG tablet Commonly known as: CRESTOR  Take 1 tablet (10 mg total) by mouth daily. Dose change        Discharge Exam: Filed Weights   08/10/24 0817 08/11/24 0500  Weight: 106.6 kg 99.4 kg   BP 118/62 (BP Location: Right Arm)   Pulse 65   Temp 98.6 F (37 C) (Oral)   Resp 19   Ht 5' 7.5 (1.715 m)   Wt 99.4 kg   SpO2 (!) 69%   BMI 33.82 kg/m   Patient is feeling, better but continue to have right shoulder pain, no dyspnea, no PND or lower extremity edema  Neurology awake and alert ENT with no pallor Cardiovascular with S1 and S2 present and regular Respiratory with no rales or wheezing Abdomen with no  distention  No lower extremity edema   Condition at discharge: stable  The results of significant diagnostics from this hospitalization (including imaging, microbiology, ancillary and laboratory) are listed below for reference.   Imaging Studies: ECHOCARDIOGRAM COMPLETE Result Date: 08/11/2024    ECHOCARDIOGRAM REPORT   Patient Name:   JENIEL SLAUSON Date of Exam: 08/11/2024 Medical Rec #:  [id number]          Height:       67.5 in Accession #:    [id number]         Weight:       219.1 lb Date of Birth:  Jan 13, 1951           BSA:          2.114 m Patient Age:    73 years           BP:           134/75 mmHg Patient Gender: M                  HR:  65 bpm. Exam Location:  Inpatient Procedure: 2D Echo, Cardiac Doppler and Color Doppler (Both Spectral and Color            Flow Doppler were utilized during procedure). Indications:    Chest Pain R07.9  History:        Patient has prior history of Echocardiogram examinations, most                 recent 02/07/2015. CHF, Previous Myocardial Infarction and CAD,                 Signs/Symptoms:Chest Pain; Risk Factors:Hypertension, Diabetes                 and Dyslipidemia.  Sonographer:    Thea Norlander RCS Referring Phys: [id number] Malin Sambrano DANIEL [place] IMPRESSIONS  1. Left ventricular ejection fraction, by estimation, is 55 to 60%. The left ventricle has normal function. The left ventricle has no regional wall motion abnormalities. There is mild left ventricular hypertrophy. Left ventricular diastolic parameters are consistent with Grade I diastolic dysfunction (impaired relaxation).  2. Right ventricular systolic function is normal. The right ventricular size is normal. There is normal pulmonary artery systolic pressure. The estimated right ventricular systolic pressure is 19.0 mmHg.  3. The mitral valve is normal in structure. Trivial mitral valve regurgitation. No evidence of mitral stenosis.  4. The aortic valve is normal in structure. Aortic valve  regurgitation is trivial. No aortic stenosis is present.  5. Artifact in the ascending aorta suspect reverberation artifact, if chest pain concerns persist, consider CTA aorta to rule out dissection.  6. The inferior vena cava is normal in size with greater than 50% respiratory variability, suggesting right atrial pressure of 3 mmHg. FINDINGS  Left Ventricle: Left ventricular ejection fraction, by estimation, is 55 to 60%. The left ventricle has normal function. The left ventricle has no regional wall motion abnormalities. The left ventricular internal cavity size was normal in size. There is  mild left ventricular hypertrophy. Left ventricular diastolic parameters are consistent with Grade I diastolic dysfunction (impaired relaxation). Right Ventricle: The right ventricular size is normal. No increase in right ventricular wall thickness. Right ventricular systolic function is normal. There is normal pulmonary artery systolic pressure. The tricuspid regurgitant velocity is 2.00 m/s, and  with an assumed right atrial pressure of 3 mmHg, the estimated right ventricular systolic pressure is 19.0 mmHg. Left Atrium: Left atrial size was normal in size. Right Atrium: Right atrial size was normal in size. Pericardium: There is no evidence of pericardial effusion. Mitral Valve: The mitral valve is normal in structure. Trivial mitral valve regurgitation. No evidence of mitral valve stenosis. Tricuspid Valve: The tricuspid valve is normal in structure. Tricuspid valve regurgitation is trivial. No evidence of tricuspid stenosis. Aortic Valve: The aortic valve is normal in structure. Aortic valve regurgitation is trivial. No aortic stenosis is present. Aortic valve peak gradient measures 7.0 mmHg. Pulmonic Valve: The pulmonic valve was normal in structure. Pulmonic valve regurgitation is not visualized. No evidence of pulmonic stenosis. Aorta: Artifact in the ascending aorta suspect reverberation artifact, if chest pain  concerns persist, consider CTA aorta to rule out dissection. The aortic root is normal in size and structure. Ascending aorta measurements are within normal limits for age when indexed to body surface area. Venous: The inferior vena cava is normal in size with greater than 50% respiratory variability, suggesting right atrial pressure of 3 mmHg. IAS/Shunts: No atrial level shunt detected by color flow Doppler.  LEFT  VENTRICLE PLAX 2D LVIDd:         4.20 cm   Diastology LVIDs:         2.75 cm   LV e' medial:    7.18 cm/s LV PW:         1.10 cm   LV E/e' medial:  9.4 LV IVS:        1.20 cm   LV e' lateral:   7.72 cm/s LVOT diam:     2.20 cm   LV E/e' lateral: 8.7 LV SV:         64 LV SV Index:   30 LVOT Area:     3.80 cm  RIGHT VENTRICLE             IVC RV S prime:     14.50 cm/s  IVC diam: 1.50 cm TAPSE (M-mode): 2.2 cm LEFT ATRIUM             Index        RIGHT ATRIUM           Index LA diam:        3.10 cm 1.47 cm/m   RA Area:     13.10 cm LA Vol (A2C):   50.1 ml 23.70 ml/m  RA Volume:   25.15 ml  11.90 ml/m LA Vol (A4C):   34.6 ml 16.37 ml/m LA Biplane Vol: 44.0 ml 20.81 ml/m  AORTIC VALVE AV Area (Vmax): 2.51 cm AV Vmax:        132.00 cm/s AV Peak Grad:   7.0 mmHg LVOT Vmax:      87.00 cm/s LVOT Vmean:     58.700 cm/s LVOT VTI:       0.168 m  AORTA Ao Root diam: 3.50 cm Ao Asc diam:  3.80 cm MITRAL VALVE                TRICUSPID VALVE MV Area (PHT): 2.80 cm     TR Peak grad:   16.0 mmHg MV Decel Time: 271 msec     TR Vmax:        200.00 cm/s MV E velocity: 67.35 cm/s MV A velocity: 101.95 cm/s  SHUNTS MV E/A ratio:  0.66         Systemic VTI:  0.17 m                             Systemic Diam: 2.20 cm Soyla Merck MD Electronically signed by Soyla Merck MD Signature Date/Time: 08/11/2024/4:45:09 PM    Final    DG Chest 2 View Result Date: 08/10/2024 EXAM: PA AND LATERAL (2) VIEW(S) XRAY OF THE CHEST 08/10/2024 08:42:36 AM COMPARISON: PA and lateral radiographs of the chest dated 09/12/2023.  CLINICAL HISTORY: cp. cp FINDINGS: LUNGS AND PLEURA: No focal pulmonary opacity. No pulmonary edema. No pleural effusion. No pneumothorax. HEART AND MEDIASTINUM: No acute abnormality of the cardiac and mediastinal silhouettes. BONES AND SOFT TISSUES: No acute osseous abnormality. IMPRESSION: 1. No acute cardiopulmonary pathology. Electronically signed by: Evalene Coho MD 08/10/2024 08:46 AM EDT RP Workstation: [id number]    Microbiology: Results for orders placed or performed during the hospital encounter of 02/06/22  Resp Panel by RT-PCR (Flu A&B, Covid) Nasopharyngeal Swab     Status: None   Collection Time: 02/06/22  6:16 PM   Specimen: Nasopharyngeal Swab; Nasopharyngeal(NP) swabs in vial transport medium  Result Value Ref Range Status   SARS Coronavirus 2 by RT  PCR NEGATIVE NEGATIVE Final    Comment: (NOTE) SARS-CoV-2 target nucleic acids are NOT DETECTED.  The SARS-CoV-2 RNA is generally detectable in upper respiratory specimens during the acute phase of infection. The lowest concentration of SARS-CoV-2 viral copies this assay can detect is 138 copies/mL. A negative result does not preclude SARS-Cov-2 infection and should not be used as the sole basis for treatment or other patient management decisions. A negative result may occur with  improper specimen collection/handling, submission of specimen other than nasopharyngeal swab, presence of viral mutation(s) within the areas targeted by this assay, and inadequate number of viral copies(<138 copies/mL). A negative result must be combined with clinical observations, patient history, and epidemiological information. The expected result is Negative.  Fact Sheet for Patients:  BloggerCourse.com  Fact Sheet for Healthcare Providers:  SeriousBroker.it  This test is no t yet approved or cleared by the [place]  FDA and  has been authorized for detection and/or diagnosis of  SARS-CoV-2 by FDA under an Emergency Use Authorization (EUA). This EUA will remain  in effect (meaning this test can be used) for the duration of the COVID-19 declaration under Section 564(b)(1) of the Act, 21 U.S.C.section 360bbb-3(b)(1), unless the authorization is terminated  or revoked sooner.       Influenza A by PCR NEGATIVE NEGATIVE Final   Influenza B by PCR NEGATIVE NEGATIVE Final    Comment: (NOTE) The Xpert Xpress SARS-CoV-2/FLU/RSV plus assay is intended as an aid in the diagnosis of influenza from Nasopharyngeal swab specimens and should not be used as a sole basis for treatment. Nasal washings and aspirates are unacceptable for Xpert Xpress SARS-CoV-2/FLU/RSV testing.  Fact Sheet for Patients: BloggerCourse.com  Fact Sheet for Healthcare Providers: SeriousBroker.it  This test is not yet approved or cleared by the [place]  FDA and has been authorized for detection and/or diagnosis of SARS-CoV-2 by FDA under an Emergency Use Authorization (EUA). This EUA will remain in effect (meaning this test can be used) for the duration of the COVID-19 declaration under Section 564(b)(1) of the Act, 21 U.S.C. section 360bbb-3(b)(1), unless the authorization is terminated or revoked.  Performed at [facility], [address] [place], [place] [address]     Labs: CBC: Recent Labs  Lab 08/10/24 0847 08/10/24 1326 08/11/24 0230  WBC 6.6 6.4 7.0  NEUTROABS 4.7  --   --   HGB 13.5 12.8* 13.1  HCT 41.8 39.5 41.1  MCV 90.9 90.6 91.3  PLT 179 172 194   Basic Metabolic Panel: Recent Labs  Lab 08/10/24 0847 08/10/24 1326 08/11/24 0230 08/12/24 0225  NA 138  --  138 137  K 4.4  --  4.3 4.6  CL 105  --  107 106  CO2 23  --  23 25  GLUCOSE 107*  --  124* 124*  BUN 23  --  25* 25*  CREATININE 1.63* 1.53* 1.52* 1.79*  CALCIUM  8.8*  --  8.3* 8.2*   Liver Function Tests: Recent Labs  Lab 08/10/24 0847   AST 20  ALT 9  ALKPHOS 66  BILITOT 0.5  PROT 6.9  ALBUMIN 4.1   CBG: Recent Labs  Lab 08/11/24 0647 08/11/24 1226 08/11/24 1640 08/11/24 2130 08/12/24 0604  GLUCAP 104* 106* 106* 102* 112*    Discharge time spent: greater than 30 minutes.  Signed: Elidia Toribio Furnace, MD [facility] 08/12/2024

## 2024-08-18 DIAGNOSIS — R079 Chest pain, unspecified: Secondary | ICD-10-CM | POA: Diagnosis not present

## 2024-08-18 DIAGNOSIS — M79603 Pain in arm, unspecified: Secondary | ICD-10-CM | POA: Diagnosis not present

## 2024-09-08 ENCOUNTER — Ambulatory Visit: Admitting: Physician Assistant

## 2024-10-29 ENCOUNTER — Other Ambulatory Visit (HOSPITAL_COMMUNITY): Payer: Self-pay | Admitting: Urology

## 2024-10-29 DIAGNOSIS — C61 Malignant neoplasm of prostate: Secondary | ICD-10-CM

## 2024-11-10 ENCOUNTER — Encounter (HOSPITAL_COMMUNITY)
Admission: RE | Admit: 2024-11-10 | Discharge: 2024-11-10 | Disposition: A | Discharge status: Home / Self Care | Source: Ambulatory Visit | Attending: Urology | Admitting: Urology

## 2024-11-10 ENCOUNTER — Ambulatory Visit (HOSPITAL_COMMUNITY)
Admission: RE | Admit: 2024-11-10 | Discharge: 2024-11-10 | Disposition: A | Discharge status: Home / Self Care | Source: Ambulatory Visit | Attending: Urology | Admitting: Urology

## 2024-11-10 DIAGNOSIS — C61 Malignant neoplasm of prostate: Secondary | ICD-10-CM | POA: Insufficient documentation

## 2024-11-10 LAB — POCT I-STAT CREATININE: Creatinine, Ser: 1.8 mg/dL — ABNORMAL HIGH (ref 0.61–1.24)

## 2024-11-10 MED ORDER — TECHNETIUM TC 99M MEDRONATE IV KIT
20.0000 | PACK | Freq: Once | INTRAVENOUS | Status: AC | PRN
  Administered 2024-11-10: 19.7 via INTRAVENOUS

## 2024-11-10 MED ORDER — IOHEXOL 300 MG/ML  SOLN
100.0000 mL | Freq: Once | INTRAMUSCULAR | Status: AC | PRN
  Administered 2024-11-10: 80 mL via INTRAVENOUS
